# Patient Record
Sex: Male | Born: 1960
Health system: Southern US, Community
[De-identification: ages and names within clinical notes are randomized; demographics above are authoritative.]

## PROBLEM LIST (undated history)

## (undated) DIAGNOSIS — Z87442 Personal history of urinary calculi: Secondary | ICD-10-CM

## (undated) DIAGNOSIS — F32A Depression, unspecified: Secondary | ICD-10-CM

## (undated) DIAGNOSIS — D869 Sarcoidosis, unspecified: Secondary | ICD-10-CM

## (undated) DIAGNOSIS — G43909 Migraine, unspecified, not intractable, without status migrainosus: Secondary | ICD-10-CM

## (undated) DIAGNOSIS — K219 Gastro-esophageal reflux disease without esophagitis: Secondary | ICD-10-CM

## (undated) DIAGNOSIS — I10 Essential (primary) hypertension: Secondary | ICD-10-CM

## (undated) DIAGNOSIS — G4700 Insomnia, unspecified: Secondary | ICD-10-CM

## (undated) DIAGNOSIS — M545 Low back pain, unspecified: Secondary | ICD-10-CM

## (undated) DIAGNOSIS — F329 Major depressive disorder, single episode, unspecified: Secondary | ICD-10-CM

## (undated) DIAGNOSIS — G8929 Other chronic pain: Secondary | ICD-10-CM

## (undated) DIAGNOSIS — I509 Heart failure, unspecified: Secondary | ICD-10-CM

## (undated) DIAGNOSIS — I214 Non-ST elevation (NSTEMI) myocardial infarction: Secondary | ICD-10-CM

## (undated) DIAGNOSIS — N182 Chronic kidney disease, stage 2 (mild): Secondary | ICD-10-CM

## (undated) DIAGNOSIS — N289 Disorder of kidney and ureter, unspecified: Secondary | ICD-10-CM

## (undated) DIAGNOSIS — M1990 Unspecified osteoarthritis, unspecified site: Secondary | ICD-10-CM

## (undated) DIAGNOSIS — F418 Other specified anxiety disorders: Secondary | ICD-10-CM

## (undated) DIAGNOSIS — G4730 Sleep apnea, unspecified: Secondary | ICD-10-CM

## (undated) HISTORY — DX: Low back pain: M54.5

## (undated) HISTORY — DX: Other chronic pain: G89.29

## (undated) HISTORY — DX: Migraine, unspecified, not intractable, without status migrainosus: G43.909

## (undated) HISTORY — DX: Gastro-esophageal reflux disease without esophagitis: K21.9

## (undated) HISTORY — PX: HIP ARTHROPLASTY: SHX981

## (undated) HISTORY — DX: Sarcoidosis, unspecified: D86.9

## (undated) HISTORY — DX: Unspecified osteoarthritis, unspecified site: M19.90

## (undated) HISTORY — DX: Insomnia, unspecified: G47.00

## (undated) HISTORY — DX: Low back pain, unspecified: M54.50

## (undated) HISTORY — DX: Other specified anxiety disorders: F41.8

## (undated) HISTORY — PX: HARVEST BONE GRAFT: SHX377

## (undated) SURGERY — Surgical Case
Anesthesia: *Unknown

---

## 1999-06-03 ENCOUNTER — Encounter: Payer: Self-pay | Admitting: Cardiology

## 2001-10-31 ENCOUNTER — Encounter: Payer: Self-pay | Admitting: Cardiology

## 2003-11-05 ENCOUNTER — Ambulatory Visit (HOSPITAL_COMMUNITY): Admission: RE | Admit: 2003-11-05 | Discharge: 2003-11-05 | Payer: Self-pay | Admitting: Internal Medicine

## 2003-11-05 HISTORY — PX: COLONOSCOPY: SHX5424

## 2004-02-18 ENCOUNTER — Ambulatory Visit: Payer: Self-pay | Admitting: Urgent Care

## 2004-03-11 ENCOUNTER — Encounter
Admission: RE | Admit: 2004-03-11 | Discharge: 2004-06-09 | Payer: Self-pay | Admitting: Physical Medicine and Rehabilitation

## 2004-03-14 ENCOUNTER — Ambulatory Visit: Payer: Self-pay | Admitting: Physical Medicine and Rehabilitation

## 2004-03-22 ENCOUNTER — Ambulatory Visit (HOSPITAL_COMMUNITY): Admission: RE | Admit: 2004-03-22 | Discharge: 2004-03-22 | Payer: Self-pay | Admitting: Internal Medicine

## 2004-03-22 ENCOUNTER — Ambulatory Visit: Payer: Self-pay | Admitting: Internal Medicine

## 2004-03-22 HISTORY — PX: ESOPHAGOGASTRODUODENOSCOPY: SHX1529

## 2004-05-18 ENCOUNTER — Ambulatory Visit: Payer: Self-pay | Admitting: Physical Medicine and Rehabilitation

## 2004-06-13 ENCOUNTER — Encounter
Admission: RE | Admit: 2004-06-13 | Discharge: 2004-09-11 | Payer: Self-pay | Admitting: Physical Medicine and Rehabilitation

## 2004-06-16 ENCOUNTER — Ambulatory Visit: Payer: Self-pay | Admitting: Internal Medicine

## 2004-06-27 ENCOUNTER — Encounter: Payer: Self-pay | Admitting: Internal Medicine

## 2004-06-27 ENCOUNTER — Ambulatory Visit (HOSPITAL_COMMUNITY): Admission: RE | Admit: 2004-06-27 | Discharge: 2004-06-27 | Payer: Self-pay | Admitting: Internal Medicine

## 2004-06-27 ENCOUNTER — Encounter (INDEPENDENT_AMBULATORY_CARE_PROVIDER_SITE_OTHER): Payer: Self-pay | Admitting: Specialist

## 2004-07-05 ENCOUNTER — Ambulatory Visit: Payer: Self-pay | Admitting: Internal Medicine

## 2004-12-29 ENCOUNTER — Ambulatory Visit: Payer: Self-pay | Admitting: Internal Medicine

## 2005-09-11 ENCOUNTER — Ambulatory Visit: Payer: Self-pay | Admitting: Internal Medicine

## 2005-09-27 ENCOUNTER — Ambulatory Visit: Payer: Self-pay | Admitting: Orthopedic Surgery

## 2005-10-02 ENCOUNTER — Ambulatory Visit (HOSPITAL_COMMUNITY): Admission: RE | Admit: 2005-10-02 | Discharge: 2005-10-02 | Payer: Self-pay | Admitting: Orthopedic Surgery

## 2005-10-25 ENCOUNTER — Ambulatory Visit: Payer: Self-pay | Admitting: Orthopedic Surgery

## 2005-11-24 ENCOUNTER — Encounter: Admission: RE | Admit: 2005-11-24 | Discharge: 2005-11-24 | Payer: Self-pay | Admitting: Orthopedic Surgery

## 2005-12-29 ENCOUNTER — Encounter: Admission: RE | Admit: 2005-12-29 | Discharge: 2005-12-29 | Payer: Self-pay | Admitting: Orthopedic Surgery

## 2006-01-19 ENCOUNTER — Encounter: Admission: RE | Admit: 2006-01-19 | Discharge: 2006-01-19 | Payer: Self-pay | Admitting: Orthopedic Surgery

## 2006-02-21 ENCOUNTER — Ambulatory Visit: Payer: Self-pay | Admitting: Orthopedic Surgery

## 2007-03-25 ENCOUNTER — Ambulatory Visit: Payer: Self-pay | Admitting: Internal Medicine

## 2007-04-11 HISTORY — PX: LAMINECTOMY: SHX219

## 2007-04-30 ENCOUNTER — Ambulatory Visit: Payer: Self-pay | Admitting: Pain Medicine

## 2007-05-06 ENCOUNTER — Encounter (INDEPENDENT_AMBULATORY_CARE_PROVIDER_SITE_OTHER): Payer: Self-pay | Admitting: Internal Medicine

## 2007-05-09 ENCOUNTER — Telehealth (INDEPENDENT_AMBULATORY_CARE_PROVIDER_SITE_OTHER): Payer: Self-pay | Admitting: Internal Medicine

## 2007-05-09 ENCOUNTER — Ambulatory Visit: Payer: Self-pay | Admitting: Internal Medicine

## 2007-05-09 DIAGNOSIS — Z87898 Personal history of other specified conditions: Secondary | ICD-10-CM | POA: Insufficient documentation

## 2007-05-09 DIAGNOSIS — M129 Arthropathy, unspecified: Secondary | ICD-10-CM | POA: Insufficient documentation

## 2007-05-09 DIAGNOSIS — K219 Gastro-esophageal reflux disease without esophagitis: Secondary | ICD-10-CM | POA: Insufficient documentation

## 2007-05-09 DIAGNOSIS — D869 Sarcoidosis, unspecified: Secondary | ICD-10-CM | POA: Insufficient documentation

## 2007-05-09 DIAGNOSIS — M545 Low back pain, unspecified: Secondary | ICD-10-CM | POA: Insufficient documentation

## 2007-05-09 DIAGNOSIS — F3289 Other specified depressive episodes: Secondary | ICD-10-CM | POA: Insufficient documentation

## 2007-05-09 DIAGNOSIS — F411 Generalized anxiety disorder: Secondary | ICD-10-CM | POA: Insufficient documentation

## 2007-05-09 DIAGNOSIS — F329 Major depressive disorder, single episode, unspecified: Secondary | ICD-10-CM

## 2007-05-13 ENCOUNTER — Encounter (INDEPENDENT_AMBULATORY_CARE_PROVIDER_SITE_OTHER): Payer: Self-pay | Admitting: Internal Medicine

## 2007-06-12 ENCOUNTER — Ambulatory Visit: Payer: Self-pay | Admitting: Pain Medicine

## 2007-06-18 ENCOUNTER — Ambulatory Visit: Payer: Self-pay | Admitting: Pain Medicine

## 2007-07-18 ENCOUNTER — Ambulatory Visit: Payer: Self-pay | Admitting: Internal Medicine

## 2007-07-18 DIAGNOSIS — G4700 Insomnia, unspecified: Secondary | ICD-10-CM | POA: Insufficient documentation

## 2007-07-18 DIAGNOSIS — J309 Allergic rhinitis, unspecified: Secondary | ICD-10-CM | POA: Insufficient documentation

## 2007-07-23 ENCOUNTER — Encounter (INDEPENDENT_AMBULATORY_CARE_PROVIDER_SITE_OTHER): Payer: Self-pay | Admitting: Internal Medicine

## 2007-07-31 ENCOUNTER — Ambulatory Visit: Payer: Self-pay | Admitting: Internal Medicine

## 2007-08-14 ENCOUNTER — Encounter (INDEPENDENT_AMBULATORY_CARE_PROVIDER_SITE_OTHER): Payer: Self-pay | Admitting: Internal Medicine

## 2007-08-14 ENCOUNTER — Ambulatory Visit: Payer: Self-pay | Admitting: Pain Medicine

## 2007-08-15 ENCOUNTER — Ambulatory Visit: Payer: Self-pay | Admitting: Pain Medicine

## 2007-08-28 ENCOUNTER — Ambulatory Visit: Payer: Self-pay | Admitting: Internal Medicine

## 2007-08-29 ENCOUNTER — Ambulatory Visit: Payer: Self-pay | Admitting: Physician Assistant

## 2007-09-25 ENCOUNTER — Encounter (INDEPENDENT_AMBULATORY_CARE_PROVIDER_SITE_OTHER): Payer: Self-pay | Admitting: Internal Medicine

## 2007-09-26 ENCOUNTER — Ambulatory Visit: Payer: Self-pay | Admitting: Physician Assistant

## 2007-09-27 ENCOUNTER — Ambulatory Visit: Payer: Self-pay | Admitting: Internal Medicine

## 2007-10-03 ENCOUNTER — Encounter (INDEPENDENT_AMBULATORY_CARE_PROVIDER_SITE_OTHER): Payer: Self-pay | Admitting: Internal Medicine

## 2007-10-03 ENCOUNTER — Ambulatory Visit: Payer: Self-pay | Admitting: Internal Medicine

## 2007-10-30 ENCOUNTER — Ambulatory Visit: Payer: Self-pay | Admitting: Physician Assistant

## 2007-10-30 ENCOUNTER — Encounter (INDEPENDENT_AMBULATORY_CARE_PROVIDER_SITE_OTHER): Payer: Self-pay | Admitting: Internal Medicine

## 2007-11-29 ENCOUNTER — Ambulatory Visit: Payer: Self-pay | Admitting: Internal Medicine

## 2007-12-03 ENCOUNTER — Ambulatory Visit: Payer: Self-pay | Admitting: Physician Assistant

## 2007-12-04 ENCOUNTER — Telehealth (INDEPENDENT_AMBULATORY_CARE_PROVIDER_SITE_OTHER): Payer: Self-pay | Admitting: *Deleted

## 2007-12-05 ENCOUNTER — Encounter (INDEPENDENT_AMBULATORY_CARE_PROVIDER_SITE_OTHER): Payer: Self-pay | Admitting: Internal Medicine

## 2007-12-10 ENCOUNTER — Ambulatory Visit: Payer: Self-pay | Admitting: Pain Medicine

## 2007-12-11 ENCOUNTER — Encounter (INDEPENDENT_AMBULATORY_CARE_PROVIDER_SITE_OTHER): Payer: Self-pay | Admitting: Internal Medicine

## 2007-12-18 ENCOUNTER — Ambulatory Visit: Payer: Self-pay | Admitting: Internal Medicine

## 2007-12-18 LAB — CONVERTED CEMR LAB
Blood in Urine, dipstick: NEGATIVE
Ketones, urine, test strip: NEGATIVE
Nitrite: NEGATIVE
WBC Urine, dipstick: NEGATIVE
pH: 6.5

## 2007-12-24 ENCOUNTER — Ambulatory Visit: Payer: Self-pay | Admitting: Physician Assistant

## 2007-12-27 ENCOUNTER — Ambulatory Visit: Payer: Self-pay | Admitting: Internal Medicine

## 2008-01-02 ENCOUNTER — Telehealth (INDEPENDENT_AMBULATORY_CARE_PROVIDER_SITE_OTHER): Payer: Self-pay | Admitting: *Deleted

## 2008-01-03 ENCOUNTER — Ambulatory Visit (HOSPITAL_COMMUNITY): Admission: RE | Admit: 2008-01-03 | Discharge: 2008-01-03 | Payer: Self-pay | Admitting: Internal Medicine

## 2008-01-03 ENCOUNTER — Ambulatory Visit: Payer: Self-pay | Admitting: Internal Medicine

## 2008-01-24 ENCOUNTER — Ambulatory Visit: Payer: Self-pay | Admitting: Internal Medicine

## 2008-02-21 ENCOUNTER — Ambulatory Visit: Payer: Self-pay | Admitting: Internal Medicine

## 2008-02-24 ENCOUNTER — Ambulatory Visit: Payer: Self-pay | Admitting: Physician Assistant

## 2008-03-16 ENCOUNTER — Encounter (INDEPENDENT_AMBULATORY_CARE_PROVIDER_SITE_OTHER): Payer: Self-pay | Admitting: Internal Medicine

## 2008-03-27 ENCOUNTER — Ambulatory Visit: Payer: Self-pay | Admitting: Internal Medicine

## 2008-03-27 DIAGNOSIS — F17200 Nicotine dependence, unspecified, uncomplicated: Secondary | ICD-10-CM | POA: Insufficient documentation

## 2008-03-31 LAB — CONVERTED CEMR LAB: Varicella IgG: 2.93 — ABNORMAL HIGH

## 2008-04-24 ENCOUNTER — Ambulatory Visit: Payer: Self-pay | Admitting: Internal Medicine

## 2008-05-05 ENCOUNTER — Encounter: Admission: RE | Admit: 2008-05-05 | Discharge: 2008-05-05 | Payer: Self-pay | Admitting: Neurosurgery

## 2008-05-19 ENCOUNTER — Ambulatory Visit: Payer: Self-pay | Admitting: Internal Medicine

## 2008-05-20 ENCOUNTER — Inpatient Hospital Stay (HOSPITAL_COMMUNITY): Admission: RE | Admit: 2008-05-20 | Discharge: 2008-05-22 | Payer: Self-pay | Admitting: Neurosurgery

## 2008-05-20 LAB — CONVERTED CEMR LAB
Basophils Absolute: 0.1 10*3/uL (ref 0.0–0.1)
Basophils Relative: 1 % (ref 0–1)
Hemoglobin: 13.9 g/dL (ref 13.0–17.0)
INR: 0.9 (ref 0.0–1.5)
Lymphocytes Relative: 26 % (ref 12–46)
MCHC: 34 g/dL (ref 30.0–36.0)
Monocytes Absolute: 0.9 10*3/uL (ref 0.1–1.0)
Neutro Abs: 7.3 10*3/uL (ref 1.7–7.7)
Neutrophils Relative %: 64 % (ref 43–77)
Prothrombin Time: 11.8 s (ref 11.6–15.2)
RDW: 14.2 % (ref 11.5–15.5)
aPTT: 31 s

## 2008-06-08 ENCOUNTER — Encounter: Payer: Self-pay | Admitting: Physician Assistant

## 2008-06-17 ENCOUNTER — Ambulatory Visit: Payer: Self-pay | Admitting: Physician Assistant

## 2008-07-06 ENCOUNTER — Ambulatory Visit (HOSPITAL_COMMUNITY): Admission: RE | Admit: 2008-07-06 | Discharge: 2008-07-06 | Payer: Self-pay | Admitting: Internal Medicine

## 2008-07-06 ENCOUNTER — Ambulatory Visit: Payer: Self-pay | Admitting: Internal Medicine

## 2008-07-13 ENCOUNTER — Telehealth (INDEPENDENT_AMBULATORY_CARE_PROVIDER_SITE_OTHER): Payer: Self-pay | Admitting: Internal Medicine

## 2008-07-27 ENCOUNTER — Ambulatory Visit: Payer: Self-pay | Admitting: Internal Medicine

## 2008-07-27 ENCOUNTER — Ambulatory Visit (HOSPITAL_COMMUNITY): Admission: RE | Admit: 2008-07-27 | Discharge: 2008-07-27 | Payer: Self-pay | Admitting: Internal Medicine

## 2008-07-29 ENCOUNTER — Telehealth (INDEPENDENT_AMBULATORY_CARE_PROVIDER_SITE_OTHER): Payer: Self-pay | Admitting: Internal Medicine

## 2008-08-03 ENCOUNTER — Ambulatory Visit: Payer: Self-pay | Admitting: Internal Medicine

## 2008-08-03 LAB — CONVERTED CEMR LAB
Calcium: 9.4 mg/dL (ref 8.4–10.5)
Creatinine, Ser: 1.24 mg/dL (ref 0.40–1.50)
Sodium: 140 meq/L (ref 135–145)

## 2008-08-05 ENCOUNTER — Ambulatory Visit (HOSPITAL_COMMUNITY): Admission: RE | Admit: 2008-08-05 | Discharge: 2008-08-05 | Payer: Self-pay | Admitting: Internal Medicine

## 2008-08-17 ENCOUNTER — Ambulatory Visit: Payer: Self-pay | Admitting: Internal Medicine

## 2008-09-14 ENCOUNTER — Ambulatory Visit: Payer: Self-pay | Admitting: Internal Medicine

## 2008-10-05 ENCOUNTER — Encounter (INDEPENDENT_AMBULATORY_CARE_PROVIDER_SITE_OTHER): Payer: Self-pay

## 2008-10-14 ENCOUNTER — Ambulatory Visit: Payer: Self-pay | Admitting: Internal Medicine

## 2008-11-18 ENCOUNTER — Ambulatory Visit: Payer: Self-pay | Admitting: Internal Medicine

## 2008-12-22 ENCOUNTER — Encounter: Admission: RE | Admit: 2008-12-22 | Discharge: 2008-12-22 | Payer: Self-pay | Admitting: Neurosurgery

## 2008-12-23 ENCOUNTER — Ambulatory Visit: Payer: Self-pay | Admitting: Internal Medicine

## 2009-02-15 ENCOUNTER — Encounter: Payer: Self-pay | Admitting: Gastroenterology

## 2009-03-17 ENCOUNTER — Encounter: Payer: Self-pay | Admitting: Cardiology

## 2009-05-11 ENCOUNTER — Encounter
Admission: RE | Admit: 2009-05-11 | Discharge: 2009-08-09 | Payer: Self-pay | Admitting: Physical Medicine and Rehabilitation

## 2009-05-12 ENCOUNTER — Ambulatory Visit: Payer: Self-pay | Admitting: Physical Medicine and Rehabilitation

## 2009-06-09 ENCOUNTER — Ambulatory Visit: Payer: Self-pay | Admitting: Physical Medicine and Rehabilitation

## 2009-07-07 ENCOUNTER — Ambulatory Visit: Payer: Self-pay | Admitting: Physical Medicine and Rehabilitation

## 2009-08-09 ENCOUNTER — Encounter
Admission: RE | Admit: 2009-08-09 | Discharge: 2009-11-04 | Payer: Self-pay | Admitting: Physical Medicine and Rehabilitation

## 2009-08-18 ENCOUNTER — Ambulatory Visit: Payer: Self-pay | Admitting: Physical Medicine and Rehabilitation

## 2009-09-10 ENCOUNTER — Ambulatory Visit: Payer: Self-pay | Admitting: Physical Medicine and Rehabilitation

## 2009-10-14 ENCOUNTER — Ambulatory Visit: Payer: Self-pay | Admitting: Physical Medicine and Rehabilitation

## 2009-11-04 ENCOUNTER — Encounter
Admission: RE | Admit: 2009-11-04 | Discharge: 2010-01-24 | Payer: Self-pay | Admitting: Physical Medicine & Rehabilitation

## 2009-11-08 ENCOUNTER — Encounter: Payer: Self-pay | Admitting: Cardiology

## 2009-11-10 ENCOUNTER — Ambulatory Visit: Payer: Self-pay | Admitting: Physical Medicine and Rehabilitation

## 2009-11-17 ENCOUNTER — Encounter: Payer: Self-pay | Admitting: Cardiology

## 2009-11-29 ENCOUNTER — Encounter (INDEPENDENT_AMBULATORY_CARE_PROVIDER_SITE_OTHER): Payer: Self-pay | Admitting: *Deleted

## 2009-11-29 ENCOUNTER — Ambulatory Visit: Payer: Self-pay | Admitting: Cardiology

## 2009-12-02 ENCOUNTER — Encounter: Payer: Self-pay | Admitting: Cardiology

## 2009-12-03 ENCOUNTER — Encounter: Payer: Self-pay | Admitting: Cardiology

## 2009-12-03 ENCOUNTER — Ambulatory Visit: Payer: Self-pay | Admitting: Cardiology

## 2009-12-08 ENCOUNTER — Ambulatory Visit: Payer: Self-pay | Admitting: Physical Medicine and Rehabilitation

## 2010-01-05 ENCOUNTER — Ambulatory Visit: Payer: Self-pay | Admitting: Physical Medicine and Rehabilitation

## 2010-01-24 ENCOUNTER — Encounter
Admission: RE | Admit: 2010-01-24 | Discharge: 2010-03-25 | Payer: Self-pay | Source: Home / Self Care | Attending: Physical Medicine and Rehabilitation | Admitting: Physical Medicine and Rehabilitation

## 2010-02-02 ENCOUNTER — Ambulatory Visit: Payer: Self-pay | Admitting: Physical Medicine and Rehabilitation

## 2010-03-02 ENCOUNTER — Ambulatory Visit: Payer: Self-pay | Admitting: Physical Medicine and Rehabilitation

## 2010-03-25 ENCOUNTER — Ambulatory Visit: Payer: Self-pay | Admitting: Physical Medicine and Rehabilitation

## 2010-04-10 HISTORY — PX: BACK SURGERY: SHX140

## 2010-04-19 ENCOUNTER — Encounter
Admission: RE | Admit: 2010-04-19 | Discharge: 2010-05-10 | Payer: Self-pay | Source: Home / Self Care | Attending: Physical Medicine and Rehabilitation | Admitting: Physical Medicine and Rehabilitation

## 2010-04-22 ENCOUNTER — Ambulatory Visit
Admission: RE | Admit: 2010-04-22 | Discharge: 2010-04-22 | Payer: Self-pay | Source: Home / Self Care | Attending: Physical Medicine and Rehabilitation | Admitting: Physical Medicine and Rehabilitation

## 2010-04-30 ENCOUNTER — Encounter: Payer: Self-pay | Admitting: Orthopedic Surgery

## 2010-05-01 ENCOUNTER — Encounter: Payer: Self-pay | Admitting: Internal Medicine

## 2010-05-08 LAB — CONVERTED CEMR LAB
Bilirubin Urine: NEGATIVE
Blood in Urine, dipstick: NEGATIVE
Glucose, Urine, Semiquant: NEGATIVE
Hemoglobin: 14.7 g/dL
Protein, U semiquant: NEGATIVE
Specific Gravity, Urine: 1.02
WBC Urine, dipstick: NEGATIVE
pH: 7

## 2010-05-09 ENCOUNTER — Ambulatory Visit
Admission: RE | Admit: 2010-05-09 | Discharge: 2010-05-09 | Payer: Self-pay | Source: Home / Self Care | Attending: Urgent Care | Admitting: Urgent Care

## 2010-05-09 ENCOUNTER — Encounter: Payer: Self-pay | Admitting: Internal Medicine

## 2010-05-09 DIAGNOSIS — R159 Full incontinence of feces: Secondary | ICD-10-CM | POA: Insufficient documentation

## 2010-05-09 DIAGNOSIS — R197 Diarrhea, unspecified: Secondary | ICD-10-CM | POA: Insufficient documentation

## 2010-05-10 ENCOUNTER — Ambulatory Visit
Admission: RE | Admit: 2010-05-10 | Discharge: 2010-05-10 | Payer: Self-pay | Source: Home / Self Care | Attending: Physical Medicine and Rehabilitation | Admitting: Physical Medicine and Rehabilitation

## 2010-05-10 ENCOUNTER — Encounter: Payer: Self-pay | Admitting: Urgent Care

## 2010-05-12 NOTE — Letter (Signed)
Summary: Internal Other/ PATIENT HISTORY FORM  Internal Other/ PATIENT HISTORY FORM   Imported By: Dorise Hiss 12/07/2009 15:55:40  _____________________________________________________________________  External Attachment:    Type:   Image     Comment:   External Document

## 2010-05-12 NOTE — Letter (Signed)
Summary: External Correspondence/ OFFICE VISIT DR. Aleen Campi  External Correspondence/ OFFICE VISIT DR. Aleen Campi   Imported By: Dorise Hiss 11/18/2009 15:51:26  _____________________________________________________________________  External Attachment:    Type:   Image     Comment:   External Document

## 2010-05-12 NOTE — Letter (Signed)
Summary: Letter/ [facility]  Letter/ [facility]   Imported By: Dorise Hiss 12/02/2009 11:46:30  _____________________________________________________________________  External Attachment:    Type:   Image     Comment:   External Document  Appended Document: Letter/ [facility] Patient just seen in consultation on 8/22 with atypical CP.  Stress testing is pending.  Can make recommendation when results available.  Appended Document: Letter/ [facility] Faxed this note with copy of office note and stress test results to oral surgeon.

## 2010-05-12 NOTE — Medication Information (Signed)
Summary: Environmental health practitioner [facility]  RX Folder/ [facility]   Imported By: Dorise Hiss 11/18/2009 16:00:25  _____________________________________________________________________  External Attachment:    Type:   Image     Comment:   External Document

## 2010-05-12 NOTE — Assessment & Plan Note (Signed)
Summary: NP-CHEST PAIN   Visit Type:  Initial Consult Primary Provider:  Kermit Balo. Tysinger, PA-C   History of Present Illness: 50 year old male referred for cardiology consultation. He describes a 3-4 month history of intermittent chest pain described as a "hammer." He seems to indicate a relation to "stress" in his life. He reports that when he was at the beach for a week, he had no symptoms. No specific relation to exertion or his baseline shortness of breath with activity. No palpitations or syncope. Symptoms can last "all day."  He reports a vague history of "heart problems." This is not at all well-defined based on chart review. He states he had "heart attacks" each year ranging from 2003 through 2007. [facility] chart review shows consistently normal troponin levels dating between 2006 and 2009. It appears that he underwent a standard treadmill test in April 2006, report purged. Denies any interval cardiac testing.  Patient states that he maintains follow up with Dr. Orson Aloe and Dr. Jena Gauss for both pulmonary and hepatic sarcoidosis. He continues to smoke cigarettes heavily. We discussed smoking cessation today.  Preventive Screening-Counseling & Management  Alcohol-Tobacco     Smoking Status: current     Packs/Day: 0.5     Year Started: 20 +  Current Medications (verified): 1)  Patanol 0.1 %  Soln (Olopatadine Hcl) .Marland Kitchen.. 1 Drop in Each Eye Two Times A Day As Needed 2)  Fluticasone Propionate 50 Mcg/act  Susp (Fluticasone Propionate) .... 2 Sprays in Each Nostril Two Times A Day As Needed 3)  Abilify 10 Mg Tabs (Aripiprazole) .Marland Kitchen.. 1 By Mouth Once Daily 4)  Albuterol Sulfate (2.5 Mg/65ml) 0.083% Nebu (Albuterol Sulfate) .Marland Kitchen.. 1 in Nebulizer Q 4 Hours As Needed Shortness of Breath 5)  Ventolin Hfa 108 (90 Base) Mcg/act Aers (Albuterol Sulfate) .... 2 Puffs Q 6 Hours As Needed Shortness of Breath 6)  Venlafaxine Hcl 37.5 Mg Tabs (Venlafaxine Hcl) .... Take 1 Tab By Mouth At Bedtime 7)   Diazepam 10 Mg Tabs (Diazepam) .... Take 1 Tablet By Mouth Two Times A Day 8)  Hydrocodone-Acetaminophen 5-500 Mg Tabs (Hydrocodone-Acetaminophen) .... Take 1 Tablet By Mouth Two Times A Day As Needed Back Pain 9)  Pravastatin Sodium 10 Mg Tabs (Pravastatin Sodium) .... Take 1 Tab By Mouth At Bedtime 10)  Lyrica 50 Mg Caps (Pregabalin) .... Take 1 Tablet By Mouth Two Times A Day ]  Allergies: 1)  ! * Fentanyl 2)  Aspirin 3)  * Methadone  Past History:  Family History: Last updated: 2009/12/18 Father - deceased-lung cancer Mother - deceased CHF 36 siblings (all full) 15 living siblings Sister - 27 deceased - brain, liver cancer Sister - 29 - arthritis, foot problems Brothe r- 60 - lower back pain  Social History: Last updated: 12-18-2009 Single Current Smoker - 3ppd x26years - now 1/2 per day Alcohol use - yes - 10+ per day, 2-4 times per month Disabled  Regular Exercise - yes, walks  Past Medical History: Anxiety Depression GERD Chronic low back pain - followed in Pain Clinic (Dr. Pamelia Hoit) Arthritis Migraine headaches Sarcoidosis - multisystem, pulmonary and hepatic (Dr. Jena Gauss) Allergic rhinitis Insomnia  Past Surgical History: Bilateral L4-L5 laminectomy 2/10  Family History: Father - deceased-lung cancer Mother - deceased CHF 51 siblings (all full) 15 living siblings Sister - 15 deceased - brain, liver cancer Sister - 47 - arthritis, foot problems Brothe r- 60 - lower back pain  Social History: Single Current Smoker - 3ppd x26years - now 1/2 per day  Alcohol use - yes - 10+ per day, 2-4 times per month Disabled  Regular Exercise - yes, walks Packs/Day:  0.5  Review of Systems       The patient complains of chest pain and dyspnea on exertion.  The patient denies anorexia, fever, syncope, peripheral edema, prolonged cough, headaches, melena, hematochezia, and severe indigestion/heartburn.         Reports chronic back pain. Otherwise negative except  as outlined.  Vital Signs:  Patient profile:   50 year old male Height:      74 inches Weight:      192.50 pounds BMI:     24.80 Pulse rate:   79 / minute BP sitting:   122 / 85  (left arm) Cuff size:   regular  Vitals Entered By: Hoover Brunette, LPN (November 29, 2009 3:20 PM) Is Patient Diabetic? Yes Comments chest pain x 3-4 months   Physical Exam  Additional Exam:  Overweight weight male in no acute distress. HEENT: Conjunctiva and lids normal, oropharynx with poor dentition. Neck: Supple, no elevated JVP or bruits. No thyromegaly. Lungs: Diminished breath sounds, coarse, no wheezing. Cardiac: Regular rate and rhythm, no S3 or pericardial rub. Abdomen: Soft, nontender, bowel sounds present. Extremities: No pitting edema, distal pulses 2+. Skin: Warm and dry. Musculoskeletal: No kyphosis. Neuropsychiatric: Alert and oriented x3, affect grossly appropriate.   ABI's  Procedure date:  02/12/2009  Findings:      No significant arterial occlusive disease in the lower extremities.  PFT  Procedure date:  06/15/2009  Findings:      FEC 3.8, 82% predicted. FEV1 3, 82% predicted. Unremarkable flow volume loop. TLC 5.16, 66% of predicted. DLCO 16.4, 55% of predicted. Normal airway flow rates with evidence of restrictive change and moderately reduced DLCO. Noted in the setting of sarcoidosis.  EKG  Procedure date:  11/17/2009  Findings:      Recent electrocardiogram from [facility] shows sinus bradycardia at 58 beats per minute with nonspecific ST-T wave changes, borderline prolonged PR interval.  Impression & Recommendations:  Problem # 1:  CHEST PAIN UNSPECIFIED (ICD-786.50)  Atypical chest pain syndrome as outlined above. Nonexertional or associated with any consistent precipitant other than "stress." Patient reports a very vague history of "heart problems" with no clear documentation based on record review. He has not undergone any ischemic evaluation at  least since an apparent treadmill test in 2006. Principle cardiac risk factors include gender, ongoing tobacco abuse. His electrocardiogram is nonspecific. We discussed the matter, and will plan a screening exercise echocardiogram for ischemic surveillance. Could convert to a dobutamine study if needed. If this study is low risk, doubt that further cardiac testing will be needed at this time. In that case he can continue to follow with his primary care provider.  Orders: Echo- Stress (Stress Echo)  Problem # 2:  TOBACCO ABUSE (ICD-305.1)  I discussed smoking cessation with Mr. Mcglamery.  Patient Instructions: 1)  Your physician has requested that you have a stress echocardiogram. For further information please visit [email or web address]  Please follow instruction sheet as given. If the results of your test are normal or stable, you will receive a letter. If they are abnormal, the nurse will contact you by phone.  2)  Follow up will be based on results.

## 2010-05-12 NOTE — Letter (Signed)
Summary: Stress Echocardiography  [facility] [address]   [place], [place] [address]   Phone: [phone]  Fax: [phone]      [facility]  Stress Echocardiography    Michael [name]  Appointment Date:_  Appointment Time:_   Your doctor has ordered a stress echo to help determine the condition of your heart during exercise. If you take blood pressure medication, ask your doctor if you should take it the day of your test. You should not have anything to eat or drink at least 4 hours before your test is scheduled.  You will be asked to undress from the waist up and given a hospital gown to wear, so dress comfortably from the waist down for example: Sweat pants, shorts, or skirt Rubber soled lace up shoes (tennis shoes)  You will need to register at the Outpatient/Main Entrance at the hospital 15 minutes before your appointment time. It is a good idea to bring a copy of your order with you. They will direct you to the Cardiovascular Department on the third floor.   Plan on about an hour and a half  from registration to release from the hospital  You may take all of your medications with water the morning of your test.  If the results of your test are normal or stable, you will receive a letter. If they are abnormal, the nurse will contact you by phone.

## 2010-05-13 ENCOUNTER — Encounter: Payer: Self-pay | Admitting: Urgent Care

## 2010-05-13 NOTE — Miscellaneous (Signed)
Summary: Immunizations/ [facility]  Immunizations/ [facility]   Imported By: Dorise Hiss 11/18/2009 15:59:02  _____________________________________________________________________  External Attachment:    Type:   Image     Comment:   External Document

## 2010-05-16 LAB — CONVERTED CEMR LAB
ALT: 19 units/L (ref 0–53)
AST: 16 units/L (ref 0–37)
Albumin: 3.9 g/dL (ref 3.5–5.2)
Alkaline Phosphatase: 117 units/L (ref 39–117)
Basophils Absolute: 0 10*3/uL (ref 0.0–0.1)
Bilirubin, Direct: 0.1 mg/dL (ref 0.0–0.3)
Chloride: 108 meq/L (ref 96–112)
Eosinophils Relative: 1 % (ref 0–5)
Glucose, Bld: 123 mg/dL — ABNORMAL HIGH (ref 70–99)
HCT: 39.5 % (ref 39.0–52.0)
Hemoglobin: 13.2 g/dL (ref 13.0–17.0)
Lymphocytes Relative: 14 % (ref 12–46)
Lymphs Abs: 1.8 10*3/uL (ref 0.7–4.0)
Monocytes Absolute: 1 10*3/uL (ref 0.1–1.0)
Neutro Abs: 9.7 10*3/uL — ABNORMAL HIGH (ref 1.7–7.7)
Potassium: 4 meq/L (ref 3.5–5.3)
RBC: 4.36 M/uL (ref 4.22–5.81)
RDW: 14.2 % (ref 11.5–15.5)
Sodium: 142 meq/L (ref 135–145)
Total Bilirubin: 0.3 mg/dL (ref 0.3–1.2)
WBC: 12.6 10*3/uL — ABNORMAL HIGH (ref 4.0–10.5)

## 2010-05-18 NOTE — Miscellaneous (Addendum)
Summary: Orders Update  Clinical Lists Changes  Orders: Added new Test order of T-Culture, Stool ([id number]) - Signed Added new Test order of T-Stool for O&P ([id number]) - Signed Added new Test order of T-Fecal WBC ([id number]) - Signed Added new Test order of T-C diff by PCR ([id number]) - Signed  Appended Document: Orders Update Did pt do stool studies?  Appended Document: Orders Update tried to call [facility]- all lines were busy  Appended Document: Orders Update Pt said he has not done the stool studies yet, but will try to do soon.

## 2010-05-18 NOTE — Letter (Addendum)
Summary: TCS ORDER  TCS ORDER   Imported By: Ave Filter 05/09/2010 12:10:37  _____________________________________________________________________  External Attachment:    Type:   Image     Comment:   External Document  Appended Document: TCS ORDER attempted tcs; NOT PREPPED procedure aborted; pt non-compliant; I will offer him the opportunity to try again- he is to call the office if interested.

## 2010-05-18 NOTE — Assessment & Plan Note (Signed)
Summary: FU ON LIVER,SARCOIDOSIS/SS   Visit Type:  Follow-up Visit Primary Care Provider:  Dr. Crosby Oyster  Chief Complaint:  follow up- needs tcs- had diarrhea x 1.  History of Present Illness: Pulmonologist:  Dr. Steele Berg [facility]) [facility]:  Fax [phone]  50 y/o black [name] w/ hx multisystemic sarcoidosis w/ hepatic involvement.  He has not been seen in 2 1/2 yrs.  Previously no showed or rescheduled several appts and did not return for FU until today.  Hx of incontinence in shower last week with large amt brown, non-bloody stool.  Denies diarrhea or abd pain.  Denies nausea, vomiting or fever.  Did have a slight headache.  Felt "sick" w/ weakness & fatigue.  Denies any heartburn, indigestion, dysphagia or odynophagia.  States he gained up to 220#, now he's back down to almost 60# greater than 2 1/2 yrs ago.   No recent labs done.   Current Problems (verified): 1)  Diarrhea  (ICD-787.91) 2)  Other Specified Counseling  (ICD-V65.49) 3)  Encounter For Long-term Use of Other Medications  (ICD-V58.69) 4)  Tobacco Abuse  (ICD-305.1) 5)  Insomnia  (ICD-780.52) 6)  Allergic Rhinitis  (ICD-477.9) 7)  Sarcoidosis  (ICD-135) 8)  Migraines, Hx of  (ICD-V13.8) 9)  Arthritis  (ICD-716.90) 10)  Low Back Pain  (ICD-724.2) 11)  Gerd  (ICD-530.81) 12)  Depression  (ICD-311) 13)  Anxiety  (ICD-300.00)  Current Medications (verified): 1)  Patanol 0.1 %  Soln (Olopatadine Hcl) .Marland Kitchen.. 1 Drop in Each Eye Two Times A Day As Needed 2)  Fluticasone Propionate 50 Mcg/act  Susp (Fluticasone Propionate) .... 2 Sprays in Each Nostril Two Times A Day As Needed 3)  Abilify 10 Mg Tabs (Aripiprazole) .Marland Kitchen.. 1 By Mouth Once Daily 4)  Albuterol Sulfate (2.5 Mg/34ml) 0.083% Nebu (Albuterol Sulfate) .Marland Kitchen.. 1 in Nebulizer Q 4 Hours As Needed Shortness of Breath 5)  Ventolin Hfa 108 (90 Base) Mcg/act Aers (Albuterol Sulfate) .... 2 Puffs Q 6 Hours As Needed Shortness of Breath 6)  Diazepam 10 Mg Tabs (Diazepam)  .... Take 1 Tablet By Mouth Two Times A Day 7)  Hydrocodone-Acetaminophen 5-500 Mg Tabs (Hydrocodone-Acetaminophen) .... Take 1 Tablet By Mouth Two Times A Day As Needed Back Pain 8)  Pravastatin Sodium 10 Mg Tabs (Pravastatin Sodium) .... Take 1 Tab By Mouth At Bedtime 9)  Lyrica 50 Mg Caps (Pregabalin) .... Take 1 Tablet By Mouth Two Times A Day ]  Allergies (verified): 1)  ! * Fentanyl 2)  Aspirin 3)  * Methadone  Past History:  Past Surgical History: Last updated: 11/29/2009 Bilateral L4-L5 laminectomy 2/10  Past Medical History: Anxiety Depression GERD, EGD w/SB bx 12/05->small HH, mild duodenitis Chronic low back pain - followed in Pain Clinic (Dr. Pamelia Hoit) Arthritis Migraine headaches Sarcoidosis - multisystem, pulmonary and hepatic (Dr. Jena Gauss) liver bx3/2006->sarcoidosis, mildly positive AMA, but on re-check normal Allergic rhinitis Insomnia  Family History: Reviewed history from 11/29/2009 and no changes required. Father - deceased-lung cancer Mother - deceased CHF 53 siblings (all full) 15 living siblings Sister - 9 deceased - brain, liver cancer Sister - 42 - arthritis, foot problems Brother- 22 - lower back pain  Social History: Single Current Smoker - 3ppd x26years - now 1/2 per day Alcohol use - occ Diisabled  Regular Exercise - yes, walks Illicit Drug Use - no Drug Use:  no  Review of Systems      See HPI General:  Complains of fatigue; denies fever, chills, sweats, anorexia, weakness, malaise,  weight loss, and sleep disorder. CV:  Denies chest pains, angina, palpitations, syncope, dyspnea on exertion, orthopnea, PND, peripheral edema, and claudication. Resp:  Denies dyspnea at rest, dyspnea with exercise, cough, sputum, wheezing, coughing up blood, and pleurisy. GI:  See HPI; Denies difficulty swallowing, pain on swallowing, jaundice, diarrhea, constipation, bloody BM's, and black BMs. GU:  Denies urinary burning, blood in urine, urinary  frequency, urinary hesitancy, nocturnal urination, and urinary incontinence. MS:  Denies joint pain / LOM, joint swelling, joint stiffness, joint deformity, low back pain, muscle weakness, muscle cramps, muscle atrophy, leg pain at night, leg pain with exertion, and shoulder pain / LOM hand / wrist pain (CTS). Derm:  Denies rash, itching, dry skin, hives, moles, warts, and unhealing ulcers. Psych:  Complains of depression and anxiety; denies memory loss, suicidal ideation, hallucinations, paranoia, phobia, and confusion. Heme:  Denies bruising, bleeding, and enlarged lymph nodes.  Vital Signs:  Patient profile:   50 year old [name] Height:      74 inches Weight:      194 pounds BMI:     25.00 Temp:     98.0 degrees F oral Pulse rate:   84 / minute BP sitting:   130 / 80  (left arm) Cuff size:   regular  Vitals Entered By: Hendricks Limes LPN (May 09, 2010 10:58 AM)  Physical Exam  General:  Well developed, well nourished, no acute distress. Head:  Normocephalic and atraumatic. Eyes:  Sclera clear, no icterus. Ears:  Normal auditory acuity. Nose:  No deformity, discharge,  or lesions. Mouth:  No deformity or lesions, dentition normal. Neck:  Supple; no masses or thyromegaly. Lungs:  Clear throughout to auscultation. Heart:  Regular rate and rhythm; no murmurs, rubs,  or bruits. Abdomen:  Soft, nontender and nondistended. No masses, hepatosplenomegaly or hernias noted. Normal bowel sounds.without guarding and without rebound.   Rectal:  deferred until time of colonoscopy.   Msk:  Symmetrical with no gross deformities. Normal posture. Pulses:  Normal pulses noted. Extremities:  No clubbing, cyanosis, edema or deformities noted. Neurologic:  Alert and  oriented x4;  grossly normal neurologically. Skin:  Intact without significant lesions or rashes. Cervical Nodes:  No significant cervical adenopathy. Psych:  Alert and cooperative. Normal mood and affect.  Impression &  Recommendations:  Problem # 1:  SARCOIDOSIS (ICD-135) 50 y/o black [name] w/ multisystemic sarcoidosis w/ hepatic component who has not been seen in 2-1/2 yrs w/ hx non-compliance w/ follow-up.  He presents w/  reports of fecal incontinence in the shower.  Given his age & race, he is overdue for colonoscopy and we will proceed w/ this for further evaluation to r/o colorectal ca or polyp.  Discussed compliance issues, & he agrees to follow-up as directed.  Colonoscopy to be performed by Dr. Suszanne Conners Rourk in the near future.  I have discussed risks and benefits which include, but are not limited to, bleeding, infection, perforation, or medication reaction.  The patient agrees with this plan and consent will be obtained.  Orders: [facility] T-CBC w/Diff ([id number]) T-PT (Prothrombin Time) (09811) T-Hepatic Function (91478-29562) T-Basic Metabolic Panel (13086-57846) Est. Patient Level IV (96295)  Problem # 2:  FULL INCONTINENCE OF FECES (ICD-787.60) See #1  Orders: Est. Patient Level IV (28413)

## 2010-05-24 ENCOUNTER — Encounter: Payer: Medicare Other | Attending: Physical Medicine and Rehabilitation

## 2010-05-24 ENCOUNTER — Ambulatory Visit: Payer: Medicare Other

## 2010-05-24 DIAGNOSIS — F341 Dysthymic disorder: Secondary | ICD-10-CM | POA: Insufficient documentation

## 2010-05-24 DIAGNOSIS — M545 Low back pain, unspecified: Secondary | ICD-10-CM

## 2010-05-24 DIAGNOSIS — D869 Sarcoidosis, unspecified: Secondary | ICD-10-CM | POA: Insufficient documentation

## 2010-05-24 DIAGNOSIS — M79609 Pain in unspecified limb: Secondary | ICD-10-CM

## 2010-05-24 DIAGNOSIS — G43909 Migraine, unspecified, not intractable, without status migrainosus: Secondary | ICD-10-CM | POA: Insufficient documentation

## 2010-05-24 DIAGNOSIS — G894 Chronic pain syndrome: Secondary | ICD-10-CM

## 2010-05-24 DIAGNOSIS — Z79899 Other long term (current) drug therapy: Secondary | ICD-10-CM | POA: Insufficient documentation

## 2010-05-31 ENCOUNTER — Encounter: Payer: Self-pay | Admitting: Internal Medicine

## 2010-06-03 ENCOUNTER — Encounter: Payer: Self-pay | Admitting: Urgent Care

## 2010-06-07 ENCOUNTER — Ambulatory Visit: Payer: Medicare Other

## 2010-06-07 DIAGNOSIS — G894 Chronic pain syndrome: Secondary | ICD-10-CM

## 2010-06-07 DIAGNOSIS — M545 Low back pain, unspecified: Secondary | ICD-10-CM

## 2010-06-07 DIAGNOSIS — M79609 Pain in unspecified limb: Secondary | ICD-10-CM

## 2010-06-08 ENCOUNTER — Ambulatory Visit (HOSPITAL_COMMUNITY)
Admission: RE | Admit: 2010-06-08 | Discharge: 2010-06-08 | Disposition: A | Discharge status: Home / Self Care | Payer: Medicare Other | Source: Ambulatory Visit | Attending: Internal Medicine | Admitting: Internal Medicine

## 2010-06-08 ENCOUNTER — Encounter (INDEPENDENT_AMBULATORY_CARE_PROVIDER_SITE_OTHER): Payer: Self-pay

## 2010-06-08 ENCOUNTER — Encounter: Payer: Medicare Other | Admitting: Internal Medicine

## 2010-06-08 ENCOUNTER — Encounter: Payer: Self-pay | Admitting: Internal Medicine

## 2010-06-08 DIAGNOSIS — I10 Essential (primary) hypertension: Secondary | ICD-10-CM | POA: Insufficient documentation

## 2010-06-08 DIAGNOSIS — Z79899 Other long term (current) drug therapy: Secondary | ICD-10-CM | POA: Insufficient documentation

## 2010-06-08 DIAGNOSIS — Z538 Procedure and treatment not carried out for other reasons: Secondary | ICD-10-CM | POA: Insufficient documentation

## 2010-06-08 DIAGNOSIS — Z1211 Encounter for screening for malignant neoplasm of colon: Secondary | ICD-10-CM | POA: Insufficient documentation

## 2010-06-14 ENCOUNTER — Encounter: Payer: Medicare Other | Attending: Physical Medicine and Rehabilitation

## 2010-06-14 ENCOUNTER — Ambulatory Visit: Payer: Medicare Other

## 2010-06-14 DIAGNOSIS — M79609 Pain in unspecified limb: Secondary | ICD-10-CM

## 2010-06-14 DIAGNOSIS — M545 Low back pain, unspecified: Secondary | ICD-10-CM | POA: Insufficient documentation

## 2010-06-14 DIAGNOSIS — D869 Sarcoidosis, unspecified: Secondary | ICD-10-CM | POA: Insufficient documentation

## 2010-06-14 DIAGNOSIS — G8929 Other chronic pain: Secondary | ICD-10-CM | POA: Insufficient documentation

## 2010-06-14 DIAGNOSIS — G894 Chronic pain syndrome: Secondary | ICD-10-CM

## 2010-06-14 DIAGNOSIS — F341 Dysthymic disorder: Secondary | ICD-10-CM | POA: Insufficient documentation

## 2010-06-14 DIAGNOSIS — G43909 Migraine, unspecified, not intractable, without status migrainosus: Secondary | ICD-10-CM | POA: Insufficient documentation

## 2010-06-14 DIAGNOSIS — Z79899 Other long term (current) drug therapy: Secondary | ICD-10-CM | POA: Insufficient documentation

## 2010-06-15 NOTE — Op Note (Addendum)
  NAME:  Michael [name], Michael [name]                  ACCOUNT NO.:  [id number]  MEDICAL RECORD NO.:  [id number]           PATIENT TYPE:  O  LOCATION:  [facility]                          FACILITY:  [facility]  PHYSICIAN:  R. Roetta Sessions, M.D. DATE OF BIRTH:  01-Nov-1960  DATE OF PROCEDURE:  06/08/2010 DATE OF DISCHARGE:                              OPERATIVE REPORT   PROCEDURE:  Attempted colonoscopy.  INDICATIONS FOR PROCEDURE:  A 50 year old gentleman has had some recent complaints of cecal leak, (symptoms have since resolved) comes for his first ever screening colonoscopy.  No family history of colon cancer or colon polyps.  Colonoscopy is now being done as standard screening maneuver.  Risks, benefits, limitations, alternatives, and imponderable have been reviewed, questions were answered.  Please see documentation medical record.  PROCEDURE NOTE:  O2 saturation, blood pressure, pulse, and respirations monitored throughout the entire procedure.  CONSCIOUS SEDATION:  Versed 4 mg IV and Demerol 100 mg IV in divided doses.  INSTRUMENT:  Pentax video chip system.  FINDINGS:  Digital rectal exam revealed no abnormalities.  Endoscopic findings:  Prep was poor.  He appeared to have an unprepped colon, there were solid stool in the proximal rectum, occluding the lumen up in the sigmoid, it was obvious we were not going to be able to accomplish colonoscopy because of lack of prep.  The scope was withdrawn.  The patient was taken to the postop area.  [name] drinking 2 small glass of prep because of taste in "nasty."  IMPRESSION:  I aborted attempted colonoscopy secondary to lack of adequate prep, the patient's noncompliance.  RECOMMENDATIONS:  I discussed this with [name].  If he is interested and undergoing a colonoscopy in the future, he will have to comply with the prep instructions, and recommended he will contact us if he is interested in having colonoscopy done in the near future.     [name], M.D.     RMR/MEDQ  D:  06/08/2010  T:  06/08/2010  Job:  [id number]  Electronically Signed by Lorrin Goodell M.D. on 06/14/2010 02:42:47 PM Electronically Signed by Lorrin Goodell M.D. on 06/14/2010 03:23:41 PM Electronically Signed by Lorrin Goodell M.D. on 06/14/2010 03:48:02 PM Electronically Signed by Lorrin Goodell M.D. on 06/14/2010 04:26:08 PM Electronically Signed by Lorrin Goodell M.D. on 06/14/2010 07:36:58 PM

## 2010-06-16 NOTE — Letter (Addendum)
Summary: Normal Results Letter  [facility]  [address]   [place], [place] [address]   Phone: [phone]  Fax: [phone]    June 08, 2010  AMAHRI DENGEL [address] [place], [place]  [address]  [place] Sep 14, 1960   Dear Mr. Simms,   Our office has been trying to contact you.  We just wanted to inform you that all of your stool tests were normal.  If you have any questions, please call the office at [phone].   Thank you,    Hendricks Limes, LPN Cloria Spring, LPN  [facility] Ph: [phone]   Fax: [phone]   Appended Document: Normal Results Letter pt called and informed of the results- cdg

## 2010-06-24 ENCOUNTER — Ambulatory Visit: Payer: Medicare Other | Admitting: Physical Medicine and Rehabilitation

## 2010-06-24 ENCOUNTER — Encounter: Payer: Medicare Other | Attending: Physical Medicine and Rehabilitation

## 2010-06-24 DIAGNOSIS — G894 Chronic pain syndrome: Secondary | ICD-10-CM

## 2010-06-24 DIAGNOSIS — M545 Low back pain, unspecified: Secondary | ICD-10-CM | POA: Insufficient documentation

## 2010-06-24 DIAGNOSIS — M79609 Pain in unspecified limb: Secondary | ICD-10-CM | POA: Insufficient documentation

## 2010-06-27 ENCOUNTER — Other Ambulatory Visit: Payer: Self-pay | Admitting: Physical Medicine and Rehabilitation

## 2010-06-27 ENCOUNTER — Ambulatory Visit (HOSPITAL_COMMUNITY)
Admission: RE | Admit: 2010-06-27 | Discharge: 2010-06-27 | Disposition: A | Discharge status: Home / Self Care | Payer: Medicare Other | Source: Ambulatory Visit | Attending: Physical Medicine and Rehabilitation | Admitting: Physical Medicine and Rehabilitation

## 2010-06-27 DIAGNOSIS — M545 Low back pain, unspecified: Secondary | ICD-10-CM

## 2010-06-27 DIAGNOSIS — R1032 Left lower quadrant pain: Secondary | ICD-10-CM | POA: Insufficient documentation

## 2010-06-28 ENCOUNTER — Ambulatory Visit: Payer: Medicare Other

## 2010-07-08 ENCOUNTER — Emergency Department (HOSPITAL_COMMUNITY)
Admission: EM | Admit: 2010-07-08 | Discharge: 2010-07-08 | Disposition: A | Discharge status: Home / Self Care | Payer: Medicare Other | Attending: Emergency Medicine | Admitting: Emergency Medicine

## 2010-07-08 DIAGNOSIS — G8929 Other chronic pain: Secondary | ICD-10-CM | POA: Insufficient documentation

## 2010-07-08 DIAGNOSIS — M545 Low back pain, unspecified: Secondary | ICD-10-CM | POA: Insufficient documentation

## 2010-07-11 ENCOUNTER — Ambulatory Visit: Payer: Medicare Other | Admitting: Physical Medicine and Rehabilitation

## 2010-07-12 ENCOUNTER — Encounter: Payer: Medicare Other | Attending: Physical Medicine and Rehabilitation

## 2010-07-12 DIAGNOSIS — D869 Sarcoidosis, unspecified: Secondary | ICD-10-CM | POA: Insufficient documentation

## 2010-07-12 DIAGNOSIS — M545 Low back pain, unspecified: Secondary | ICD-10-CM | POA: Insufficient documentation

## 2010-07-12 DIAGNOSIS — F341 Dysthymic disorder: Secondary | ICD-10-CM | POA: Insufficient documentation

## 2010-07-12 DIAGNOSIS — Z79899 Other long term (current) drug therapy: Secondary | ICD-10-CM | POA: Insufficient documentation

## 2010-07-12 DIAGNOSIS — M729 Fibroblastic disorder, unspecified: Secondary | ICD-10-CM

## 2010-07-12 DIAGNOSIS — M79609 Pain in unspecified limb: Secondary | ICD-10-CM | POA: Insufficient documentation

## 2010-07-12 DIAGNOSIS — G894 Chronic pain syndrome: Secondary | ICD-10-CM

## 2010-07-14 ENCOUNTER — Other Ambulatory Visit: Payer: Self-pay | Admitting: Physical Medicine and Rehabilitation

## 2010-07-14 DIAGNOSIS — M79605 Pain in left leg: Secondary | ICD-10-CM

## 2010-07-20 ENCOUNTER — Ambulatory Visit (HOSPITAL_COMMUNITY)
Admission: RE | Admit: 2010-07-20 | Discharge: 2010-07-20 | Disposition: A | Discharge status: Home / Self Care | Payer: Medicare Other | Source: Ambulatory Visit | Attending: Physical Medicine and Rehabilitation | Admitting: Physical Medicine and Rehabilitation

## 2010-07-20 DIAGNOSIS — M545 Low back pain, unspecified: Secondary | ICD-10-CM | POA: Insufficient documentation

## 2010-07-20 DIAGNOSIS — M79605 Pain in left leg: Secondary | ICD-10-CM

## 2010-07-20 DIAGNOSIS — M79609 Pain in unspecified limb: Secondary | ICD-10-CM | POA: Insufficient documentation

## 2010-07-20 DIAGNOSIS — M5126 Other intervertebral disc displacement, lumbar region: Secondary | ICD-10-CM | POA: Insufficient documentation

## 2010-07-26 LAB — CBC
Platelets: 241 10*3/uL (ref 150–400)
WBC: 7.1 10*3/uL (ref 4.0–10.5)

## 2010-07-26 LAB — COMPREHENSIVE METABOLIC PANEL
AST: 37 U/L (ref 0–37)
Albumin: 3.4 g/dL — ABNORMAL LOW (ref 3.5–5.2)
Alkaline Phosphatase: 102 U/L (ref 39–117)
Chloride: 108 mEq/L (ref 96–112)
GFR calc Af Amer: >60 mL/min (ref >60)
Potassium: 4.3 mEq/L (ref 3.5–5.1)
Total Bilirubin: 0.4 mg/dL (ref 0.3–1.2)

## 2010-08-05 ENCOUNTER — Encounter
Payer: Medicare Other | Attending: Physical Medicine and Rehabilitation | Admitting: Physical Medicine and Rehabilitation

## 2010-08-05 DIAGNOSIS — M545 Low back pain, unspecified: Secondary | ICD-10-CM | POA: Insufficient documentation

## 2010-08-05 DIAGNOSIS — M79609 Pain in unspecified limb: Secondary | ICD-10-CM | POA: Insufficient documentation

## 2010-08-05 DIAGNOSIS — D869 Sarcoidosis, unspecified: Secondary | ICD-10-CM | POA: Insufficient documentation

## 2010-08-05 DIAGNOSIS — F17200 Nicotine dependence, unspecified, uncomplicated: Secondary | ICD-10-CM | POA: Insufficient documentation

## 2010-08-05 DIAGNOSIS — F341 Dysthymic disorder: Secondary | ICD-10-CM | POA: Insufficient documentation

## 2010-08-05 DIAGNOSIS — G894 Chronic pain syndrome: Secondary | ICD-10-CM

## 2010-08-05 DIAGNOSIS — Z79899 Other long term (current) drug therapy: Secondary | ICD-10-CM | POA: Insufficient documentation

## 2010-08-06 NOTE — Assessment & Plan Note (Signed)
HISTORY OF PRESENT ILLNESS:  Michael [name] is a 50 year old African American gentleman who has a history of multisystemic sarcoidosis with hepatic involvement.  He follows up with Dr. Aleen Campi for this.  He has had an L4-5 laminectomy, foraminotomy per Dr. Jeral Fruit on May 20, 2008.  He is back in today.  He has been having increased low back pain and bilateral leg pain over the last several months.  An MRI was ordered recently and carried out on July 20, 2010, showing interval right L4 laminotomy and posterior decompression with recurrent right lateral recess stenosis potentially affecting right L5 nerve root.  The patient complains of bitterly of low back pain and bilateral lower extremity pain.  He describes his back pain is about 10 on a scale of 10.  His leg pain about 8 on the left, and 6 on the right.  Pain is worse with walking.  He has been using one of his old spinal orthoses to help decrease his low back pain.  He is also using Lyrica 3 times a day as well as Vicodin 3 times a day.  The pain is typically worse with walking and standing, improves with rest and medication.  FUNCTIONAL STATUS:  He can walk 25-30 minutes.  He is able to climb stairs and drive.  He is independent with self-care, sometimes he needs help with his shoes.  REVIEW OF SYSTEMS:  Negative for problems controlling bowel or bladder. Denies suicidal ideation.  Otherwise negative.  No changes in past medical, social, or family history.  Medications prescribed through center for pain include Vicodin 5/325 t.i.d. p.r.n. back or leg pain and Lyrica 50 mg t.i.d.  MRI is reviewed with the patient today is done on July 20, 2010, and the results are attached to the chart.  PHYSICAL EXAMINATION:  VITAL SIGNS:  Blood pressure is 138/86, pulse 74, respirations 18, 99% saturated on room air. GENERAL:  He is a well-developed, well-nourished [place] American gentleman who does not appear in any distress,  although he does prefer to stand throughout most of the interview.  He is oriented x3. NEUROLOGICAL:  Speech is clear.  His affect is bright.  He is alert, cooperative, and pleasant.  Follows commands without difficulty, answers my questions appropriately.  Cranial nerves and coordination are grossly intact.  His reflexes are 2+ at patellar tendons, 1+ at the right Achilles tendon, diminished at the left Achilles tendon.  Straight leg raise is negative. MUSCULOSKELETAL:  He has good motor strength in both lower extremities without obvious focal deficit at hip flexors, knee extensors, dorsiflexors, EHL, and evertors.  He is able to transition easily from sitting to standing.  Gait is non antalgic.  Tandem gait and Romberg test are performed adequately.  He has limitations in lumbar motion in all planes.  He complains of pain with palpation on the lower lumbar segments and especially in the right paraspinal musculature.  IMPRESSION: 1. Increased pain in low back and legs over the last couple of months.     He is status post an L4-5 laminectomy/foraminotomy Dr. Jeral Fruit     May 20, 2008, recent MRI scan is attached to the chart. 2. History of depression and anxiety. 3. History of nicotine addiction. 4. History of systemic sarcoidosis followed by Dr. Aleen Campi.  PLAN:  I reviewed treatment options with Michael [name].  He is currently comfortable on the current medications that he is taking that is Lyrica 3 times a day 50 mg as well as 5/325 Vicodin  3 times a day.  We discussed consideration of medial branch blocks for low back pain verses epidural steroid injection for leg pain.  He tells me he is not interested in injections.  He is adamant, he would like to follow back up with his previous spine surgeon.  We will see Michael [name] back in 1 month.  We will refer him back to Dr. Jeral Fruit for evaluation.  I have answered all his questions.  We will continue to monitor his pill count as well as  random urine drug screens.  He is currently comfortable with this treatment plan.  He finds that using his previous spinal orthoses is somewhat helpful.     Brantley Stage, M.D.    DMK/MedQ D:  08/05/2010 12:55:00  T:  08/06/2010 16:10:96  Job #:  [id number]

## 2010-08-23 NOTE — Assessment & Plan Note (Signed)
NAMEMarland Kitchen  SUHAN, PACI                   CHART#:  [id number]   DATE:  03/25/2007                       DOB:  Mar 01, 1961   CHIEF COMPLAINT:  Followup hepatic sarcoid.   SUBJECTIVE:  The patient is a 49 year old Caucasian male.  He is here  for followup office visit.  He has history of multisystem sarcoidosis  which has been stable over several years now.  He had hepatic  involvement.  He had liver function tests recently which actually looked  very good.  His alkaline phosphatase was 159.  This is down from 185.  Otherwise, his liver function tests were normal.  He had a negative AMA.  He is doing well.  He does note being under a significant amount of  stress.  He has had some death in his family.  He and his wife have been  separated for 9 months, and he has 2 teenage children residing in his  home that he is helping take care of.  He is disabled.  He has been seen  by Dr. Romeo Apple for his low back pain.  He did receive 2 injections  since he was last seen by Dr. Jena Gauss.  His bowel movements are normal,  soft, and brown.  Denies any clay-colored stool, denies any pruritus,  denies any jaundice.  He is going to be seen by pain clinic in  [place] for his chronic back pain issues.   CURRENT MEDICATIONS:  See updated list from 03/25/2007.   ALLERGIES:  METHADONE, PHENTANYL MORPHINE PATCH.   OBJECTIVE:  Weight 136.5pounds, height 74 inches, temp 97.6, blood  pressure 170, and pulse 68.  GENERAL:  The patient is a well-developed, well-nourished African-  American male in no acute distress.  HEENT:  Throat clear.  Conjunctivae pink.  Oropharynx pink and most  without any lesions.  CHEST:  Heart regular rate and rhythm, normal S1, S2.  ABDOMEN:  Positive bowel sounds x4.  No bruits auscultated.  Soft,  nontender, nondistended, without palpable mass or hepatosplenomegaly.  No rebound, tenderness, or guarding.  EXTREMITIES:  Without clubbing or edema bilaterally.   ASSESSMENT:  Hepatic  sarcoid/multisystem sarcoidosis which is stable.  Liver function tests with mildly elevated alkaline phosphatase,  otherwise normal.  Overall, he is doing very well.  He is having some  right lower extremity pain not mentioned above, and I have asked him to  follow up with a primary care Stevan Eberwein.  He is also going to be seen by  pain clinic for his chronic back pain.   PLAN:  1. Check liver function tests and office visit with Dr. Jena Gauss in 6      months.  2. He has been given phone numbers to several primary care physicians      in [place],      including Dr. Jen Mow, Dr. Erby Pian and Dr. Margo Aye to establish a      primary care Mykenzi Vanzile.  3. He is going to follow up with the pain clinic regarding his chronic      low back pain.       Lorenza Burton, N.P.  Electronically Signed     R. Roetta Sessions, M.D.  Electronically Signed    KJ/MEDQ  D:  03/26/2007  T:  03/26/2007  Job:  [id number]   cc:   Darleen Crocker  A. Gerilyn Pilgrim, M.D.

## 2010-08-23 NOTE — Op Note (Signed)
NAMELAQUON, EMEL                  ACCOUNT NO.:  [id number]   MEDICAL RECORD NO.:  [id number]          PATIENT TYPE:  INP   LOCATION:  3023                         FACILITY:  [facility]   PHYSICIAN:  Hilda Lias, M.D.   DATE OF BIRTH:  November 20, 1960   DATE OF PROCEDURE:  05/20/2008  DATE OF DISCHARGE:                               OPERATIVE REPORT   PREOPERATIVE DIAGNOSIS:  L4-L5 and L5-S1 foraminal stenosis with chronic  radiculopathy.   POSTOPERATIVE DIAGNOSIS:  L4-L5 and L5-S1 foraminal stenosis with  chronic radiculopathy.   PROCEDURE:  Bilateral L4-L5 laminotomy and foraminotomy to decompress  the L4-L5 and S1 nerve root.  Microscope.   SURGEON:  Hilda Lias, MD   ASSISTANT:  Coletta Memos, MD   CLINICAL HISTORY:  Mr. Maisel is a 50 year old gentleman complaining of  lower back pain associated with pain and tingling sensation going to  both lower extremities.  The patient has failed conservative treatment  including epidural injection.  X-ray showed that he had stenosis at L4-  5, 5-1.  The patient wants to proceed with surgery.   PROCEDURE:  The patient was taken to the OR and was positioned in prone  manner.  The back was cleaned with DuraPrep.  A midline incision from L4-  L5 was made and muscles were retracted laterally until we were able to  see the L4-L5 and L5-S1 space.  We brought the microscope to the area  and with the drill we did a laminotomy at 4-5 and 5-1.  A thick  calcified yellow ligament was also excised.  We started on the right  side using the 2 and 3 mm Kerrison punch and decompression of the thecal  sac as well as the L4 and L5 nerve root was done.  The same procedure  was done at the L5-1 on the right side.  The yellow ligament was  calcified and was probably  the source of the stenosis.  The same  procedure was done in the left side.  At the end, we had a good  decompression of the L4, L5, and S1 nerve root.  Valsalva maneuver was  negative.   Depo-Medrol and fentanyl were left in the epidural space and  the wound was closed with Vicryl and Steri-Strips.           ______________________________  Hilda Lias, M.D.     EB/MEDQ  D:  05/20/2008  T:  05/21/2008  Job:  [id number]

## 2010-08-23 NOTE — Assessment & Plan Note (Signed)
NAMEMarland Kitchen  [name], [name]                   CHART#:  [id number]   DATE:  10/03/2007                       DOB:  [birth year]   Followup multisystem sarcoid and history of hepatosplenomegaly.   The patient has done well.  He really has not had any GI problems.  He  has been off prednisone for some time.  He has been followed closely by  Dr. Gerilyn Pilgrim and Dr. Orson Aloe; Dr. Jen Mow is now his primary care  Michael [name].  Last set of LFTs from 03/25/2007; all parameters normal  except for alkaline phosphatase of 159, was down from 185 previous.  His  antimitochondrial antibody came back negative.  He has overall done very  well.   CURRENT MEDICATIONS:  See updated list.   ALLERGIES:  1. METHADONE.  2. FENTANYL.  3. MORPHINE.   PHYSICAL EXAMINATION:  GENERAL:  Today, he appears at his baseline.  VITAL SIGNS:  Weight 165, height 6 feet 2 inches, temperature 98.1, BP  100/80, and pulse 64.  SKIN:  Warm and dry.  CHEST:  Lungs are clear to auscultation.  CARDIAC:  Regular rate and rhythm without murmur, gallop, or rub.  ABDOMEN:  Flat, positive bowel sounds, soft, nontender.  No appreciable  mass or hepatosplenomegaly.   ASSESSMENT:  Multisystem sarcoid producing cholestatic enzyme pattern,  much improved more recently.  Sarcoid appears to be in more or less  remission these days.   As per the record, he did undergo a colonoscopy back in 2005, primarily  for weight loss/screening.  He was found to have minimal internal  hemorrhoids, otherwise normal colon and terminal ileum.  He would be due  for routine screening in 2015 or age 89.  We will check  hepatic profile today.  Unless something comes up, I plan to see this  nice gentleman back in 1 year.       Michael [name], M.D.  Electronically Signed     RMR/MEDQ  D:  10/03/2007  T:  10/04/2007  Job:  [id number]   cc:   Erle Crocker, M.D.  Elise Benne, M.D.  Kofi A. Gerilyn Pilgrim, M.D.
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
## 2010-08-26 NOTE — Op Note (Signed)
NAME:  Michael [name], Michael [name]                            ACCOUNT NO.:  [id number]   MEDICAL RECORD NO.:  [id number]                   PATIENT TYPE:  AMB   LOCATION:  DAY                                  FACILITY:  [facility]   PHYSICIAN:  R. Roetta Sessions, M.D.              DATE OF BIRTH:  Jun 30, 1960   DATE OF PROCEDURE:  11/05/2003  DATE OF DISCHARGE:                                 OPERATIVE REPORT   PROCEDURE PERFORMED:  Colonoscopy, diagnostic and screening.   INDICATIONS FOR PROCEDURE:  The patient is a 50 year old gentleman with  history of significant weight loss, etiology unknown, history of  sarcoidosis, and elevated alkaline phosphatase.  Recent fractionation of  alkaline phosphatase revealed a total level elevated at 411 with both liver  and bone fractions elevated at 349 and 62 respectively (liver fraction  elevated out of proportion to bone fraction).  Colonoscopy is now being done  in part for screening, in part to evaluate weight loss.  This approach had  been discussed with the patient previously and again at his bedside.  Please  see my October 06, 2003 history and physical for more information.   DESCRIPTION OF PROCEDURE:  Oxygen saturations, blood pressure pulse and  respirations were monitored throughout the entirety of the procedure.  Conscious sedation with Versed 5 mg IV, Demerol 100 mg IV in divided doses.  Instrument Olympus video chip system.   FINDINGS:  Digital exam revealed no abnormalities.  Endoscopic findings:  Prep was excellent.   Rectum:  Examination of rectal mucosa including a retroflex view of the anal  verge revealed only some minimal internal hemorrhoids.   Colon:  Colonic mucosa was surveyed from the rectosigmoid junction through  the left transverse and right colon to the area of the appendiceal orifice,  ileocecal valve and cecum.  These structures were well seen and photographed  for the record. The terminal ileum was intubated to 10 cm.  From the level  of the cecum and ileocecal valve the scope was slowly withdrawn.  All  previously mentioned mucosal surfaces were once again seen.  The colonic  mucosa appeared entirely normal as did the terminal ileal mucosa.  The  patient tolerated the procedure well and was reacted in endoscopy.   IMPRESSION:  1. Minimal internal hemorrhoids, otherwise normal rectum.  2. Normal colon.  3. Normal terminal ileum.   Based on the patient's report, some of his weight loss has stabilized and  actually he has gained weight.  He really does not have any GI symptoms at  this time.  He has reported splenomegaly on prior CT.  We had scheduled him  for an abdominal CT but he elected not to have it done.  He may have an  element of granulomatous liver disease, secondary to sarcoidosis.   Recommendation at this point is simply make arrangements for him to have a  repeat set of  LFTs when we see him back in the office in eight weeks to  assess his progress and will make further recommendations at that time.  As  far as colorectal cancer screening is concerned, he really does not need to  have another colonoscopy for 10 years.      ___________________________________________                                            [name], M.D.   RMR/MEDQ  D:  11/05/2003  T:  11/05/2003  Job:  [id number]   cc:   Darleen Crocker A. Gerilyn Pilgrim, M.D.  [address], [place]  [place]  [place] [address]  Fax: [phone]

## 2010-08-26 NOTE — Op Note (Signed)
NAME:  Michael [name], Michael [name]                  ACCOUNT NO.:  [id number]   MEDICAL RECORD NO.:  [id number]          PATIENT TYPE:  AMB   LOCATION:  DAY                           FACILITY:  [facility]   PHYSICIAN:  R. Roetta Sessions, M.D. DATE OF BIRTH:  1960-11-29   DATE OF PROCEDURE:  03/22/2004  DATE OF DISCHARGE:                                 OPERATIVE REPORT   PROCEDURE:  Esophagogastroduodenoscopy with small-bowel biopsy.   INDICATIONS FOR PROCEDURE:  The patient is 50 year old gentleman with weight  loss, recent colonoscopy demonstrating no significant findings. He continues  to loose weight. He has a history of multisystem sarcoidosis. Recent CT scan  demonstrated large abnormal appearing spleen and liver, suggestion of portal  hypertension and varices. EGD is now being done to further evaluate his  symptoms and the abnormal CT findings. The approach has been discussed with  the patient. Potential risks, benefits, and alternatives have been reviewed.  Questions answered. Please see my February 18, 2004 office dictation and my  handwritten updated H&P today.   PROCEDURE:  O2 saturation, blood pressure, pulse, and respirations were  monitored throughout the entire procedure. Conscious sedation with Versed 3  mg IV and Demerol 75 mg IV   INSTRUMENT:  Olympus video chip system.   FINDINGS:  Examination of the tubular esophagus revealed no mucosal  abnormalities. No evidence of esophageal varices or other abnormality. EG  junction easily transversed.   Stomach:  The gastric cavity was empty and insufflated well with air.  Thorough examination of the gastric mucosal including retroflection to view  the proximal stomach and esophagogastric junction demonstrated only a small  hiatal hernia. Pylorus patent and easily transversed. Examination of bulb  and second and third portion revealed no mucosal abnormalities.   THERAPY/DIAGNOSTIC MANEUVERS:  Multiple biopsies of the second and third  portion  were taken to rule out villous atrophy. The patient tolerated the  procedure well and was reactive to endoscopy.   IMPRESSION:  Normal esophagogastroduodenoscopy. Small hiatal hernia.  Otherwise normal stomach. Normal appearing D1, D2, and D3. Biopsies of D2  and D3 taken.   RECOMMENDATIONS:  Will follow up on biopsies. Will make further  recommendations in the very near future.     Otelia Sergeant   RMR/MEDQ  D:  03/22/2004  T:  03/22/2004  Job:  [id number]   cc:   Cherie Ouch  [address] [place]. [place]  [place] [address]  Fax: [phone]   [facility]

## 2010-08-26 NOTE — Assessment & Plan Note (Signed)
DATE OF SERVICE:  April 20, 2004.   MEDICAL RECORD NUMBER:  [id number].   DATE OF BIRTH:  Jun 16, 1960.   Michael [name] is a 50 year old, black gentleman who was last seen on March 14, 2004.   He has a history of bilateral low back and leg pain.   He reports that the Lidoderm that he was placed on at last visit does not  help him at all.  In fact, it gives him headaches and when he puts it on it  makes his overall pain actually worse.   He is currently requesting oxycodone.  He says he has tried other things in  the past and that is the only thing that works for him.   There is a family member who approached the nursing staff today and reports  that Michael [name] does have a history of diversion.   Michael [name] says his average is a 10 on a scale of 10.  He reports he is  currently a 10 right now.  He feels his pain completely interferes with  activity and enjoyment of life.  His sleep is overall poor.  The pain is  worse with walking, bending, sitting, activity and standing.  He reports he  gets complete relief from his medicines.  The pain is located in the low  back and extends down the posterior aspect and anterior aspect of both legs.  He has pain in the left leg and numbness in the right leg.   He reports he can walk about 10-15 minutes at a time.  He does not drive  currently.  He is able to walk without assistance.  He has been disabled  since 2000.  He does not require any assistance with ADLs.  He lives alone.  Denies any suicidal ideation.  Denies any problems controlling bowel or  bladder.  He does report night sweats.  He does have a history of sarcoid  involving his liver.  He has a history of splenomegaly and smaller right  kidney, as well as some hilar lymphadenopathy.   Recent review of scans which were done on November 29, 2003, of his lumbar  spine, lumbar MRI, showed some abnormal replacement of bone marrow at  multiple vertebral bodies, most extensively at  L4.  A followup bone scan was  done which was reviewed.  This exam was done on November 30, 2003.  The  lesions that were seen on MRI do not show any abnormality by nuclear  medicine scan, however, they did recommend a followup MRI in two to three  months to look for any progression of these lesions.  I discussed this with  Michael [name] today.   PHYSICAL EXAMINATION:  His vital signs today are as follows:  Blood pressure  119/82, pulse 84, respirations 20, 99% saturated on room air.  He is a thin  adult male in no apparent distress during our interview.  He is oriented x  3.  His affect is rather flat.  He is able to get out of the chair without  difficulty.  His gait is normal in the room.  He has mild limitations with  forward flexion, extension and lateral flexion.  No antalgia with his gait  at all.  He reports exquisite tenderness with light palpation over the  entire lumbar paraspinal musculature.  His motor strength at hip flexors,  knee extensors, dorsiflexors, plantar flexors and EHL are all 5/5.  He has  2+ reflexes are the  knees and 0-1+ at the ankles.  Straight leg raise is  negative.   IMPRESSION:  1.  Chronic low back pain.  2.  Bilateral lower extremity pain.  3.  Abnormal MRI on October 29, 2003.   PLAN:  1.  Will go ahead and ordered serum protein electrophoresis on this      gentleman.  2.  Will repeat MRI of the lumbar spine.  3.  Will add Elavil 10 mg q.h.s. to help him with sleep at night.  4.  Will also add Neurontin 300 mg one p.o. q.h.s.  5.  Will see him back after these studies are completed within the next      month.       DMK/MedQ  D:  04/20/2004 16:48:17  T:  04/20/2004 19:08:36  Job #:  [id number]

## 2010-08-26 NOTE — Assessment & Plan Note (Signed)
MEDICAL RECORD NUMBER:  [id number].   [name] is a 50 year old black gentleman who is a patient of Dr. Karleen Dolphin  who is a pulmonary specialist as well as Dr. Morrie Sheldon.   He is back in for a recheck. He was last seen April 20, 2004. MRI was  obtained at the last visit. We have those results, and those results were  reviewed with [name] today. Essentially, MRI was compared with a study  done October 29, 2003 and read by Dr. Alice Reichert who noted that there are  multiple areas of abnormal signal scattered throughout the lumbar and sacral  marrow and that these has increased in number compared to the previous study  and that these are concerning for a metastatic process. These results were  reviewed with [name]. He has been set up to see a neurosurgeon. Mr.  Michael [name] disclosed to me for the first time today that he was seeing possibly  a blood specialist, Dr. Gerilyn Pilgrim, in [place], [place]. Apparently, they  had a falling out due to [name] being switched to a fentanyl patch  rather than p.o. medications.   There was also some blood work done on [name] including a serum protein  electrophoresis and a CBC. He has multiple abnormalities, and I would like  him to followup with a hematologist/oncologist. He is aware of this.   Apparently, his appointment with the neurosurgeon is dated June 06, 2004  at [facility].   Since I last saw Michael [name], he reports he did have a slip and a fall  several days ago. He was seen in the emergency room. Some x-rays were taken  which were negative for any kind of fracture in the shoulder area.   [name] pain is mainly in the left low back area. He has bilateral leg  pain as well. He describes his pain as fairly intense. He has trouble  describing it in terms of sharp burning or stabbing. He overall has been  sleeping well. Average pain has been about a 9 on a scale of 10. Pain is  worse with most activities, improves with heat.   He is  able to walk without assistance. He will occasionally use a walker. He  is able to climb stairs. He does not drive. Denies suicidal ideation. Denies  depression or anxiety. Denies problems controlling bowel or bladder.   No new changes in past medical, social, or family history since last visit.   PHYSICAL EXAMINATION:  VITAL SIGNS:  Blood pressure 126/85, pulse 74,  respirations 18, 100% saturated on room air.  GENERAL:  He is a thin, tall, black gentleman who does not appear to be in  any stress during our interview. He is oriented to person, place, and date.  His affect is overall bright and upbeat.   He is able to independently stand from seated position. His gait in the room  is normal. Stride length is equal, equal weight bearing. Romberg's test is  negative. He has some discomfort with forward flexion and extension  basically in all planes. He has some tenderness throughout the mid thoracic  and upper lumbar areas with percussion. Seated, reflexes are 2+ overall. No  increased tone is noted. Toes are equivocal with plantar stimulation. He  denies diminished sensation in the upper extremities; does, however, report  decreased sensation from about T10 down to his feet. Motor strength,  however, is quite good overall, 5/5 without any obvious focal deficits.   IMPRESSION:  Chronic low back pain with abnormalities on MRI scan. Please  refer to scan done April 25, 2004, and a CBC and serum protein  electrophoresis abnormalities, please refer to lab dated April 20, 2004.   These were all discussed with [name]. Would like him to followup with  HEM/ONC as well as neurosurgery. Will refill amitriptyline for him today as  well as Neurontin. Will increase that over the next month. May consider  Duragesic with him as well as possible TENS unit for pain management. Will  see him back then in a month. Will also in the meantime attempt to contact  Dr. Gerilyn Pilgrim in [place]; phone number  [phone].      DMK/MedQ  D:  05/20/2004 11:24:21  T:  05/20/2004 11:58:07  Job #:  [id number]
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
## 2010-08-26 NOTE — Assessment & Plan Note (Signed)
MEDICAL RECORD NUMBER:  [id number].   [name] is a 50 year old black male who is a patient of Dr. Steele Berg who is  a pulmonary specialist.   [name] was last seen February 10. He is back today for a recheck and  review of his MRI scans. He has abnormalities noted in his thoracic spine as  well involving vertebral bodies, posterior element. Unclear whether this is  related to his sarcoid or metastatic disease per radiology report. Also  abnormal signal noted in the cervical cord. This was reviewed with Mr.  Michael [name] today.   He continues to complain of diffuse back pain, 10 on a scale of 10. Pain is  constant, exacerbated by pretty much every activity with very little  improvement. He is currently on Neurontin and amitriptyline. He denies  problems controlling bowel or bladder. Denies suicidal ideation. Does admit  to weakness, numbness, tingling, trouble walking and spasms. He admits to  weight loss.   PHYSICAL EXAMINATION:  This is a tall, thin, black gentleman. He is oriented  x3. He is bright and alert, somewhat anxious in affect. He is able to stand  independently from a seated position. Gait is normal. He has diffuse  tenderness with palpation over his cervical lumbar spine and somewhat  through his thoracic spine also with palpation. Reflexes are 2+ and  symmetric. Toes are downgoing. Normal tone is noted. He has a negative  Romberg's test. Motor strength is excellent in upper and lower extremities.   IMPRESSION:  Diffuse back pain with weight loss with abnormal thoracic and  lumbar MRI and abnormal CBC. Recommend followup with hematology for further  evaluation. Will have copies of our note and her labs and thoracic and  lumbar MRI sent over to [facility] and will see [name] after he has been  evaluated by them.      DMK/MedQ  D:  06/15/2004 18:18:55  T:  06/16/2004 09:59:26  Job #:  [id number]

## 2010-08-31 ENCOUNTER — Other Ambulatory Visit: Payer: Self-pay | Admitting: Neurosurgery

## 2010-08-31 DIAGNOSIS — M549 Dorsalgia, unspecified: Secondary | ICD-10-CM

## 2010-09-01 ENCOUNTER — Ambulatory Visit
Admission: RE | Admit: 2010-09-01 | Discharge: 2010-09-01 | Disposition: A | Discharge status: Home / Self Care | Payer: Medicare Other | Source: Ambulatory Visit | Attending: Neurosurgery | Admitting: Neurosurgery

## 2010-09-01 DIAGNOSIS — M549 Dorsalgia, unspecified: Secondary | ICD-10-CM

## 2010-09-06 ENCOUNTER — Encounter: Payer: Medicare Other | Attending: Physical Medicine and Rehabilitation

## 2010-09-06 DIAGNOSIS — F341 Dysthymic disorder: Secondary | ICD-10-CM | POA: Insufficient documentation

## 2010-09-06 DIAGNOSIS — D869 Sarcoidosis, unspecified: Secondary | ICD-10-CM | POA: Insufficient documentation

## 2010-09-06 DIAGNOSIS — M545 Low back pain, unspecified: Secondary | ICD-10-CM | POA: Insufficient documentation

## 2010-09-06 DIAGNOSIS — G894 Chronic pain syndrome: Secondary | ICD-10-CM

## 2010-09-06 DIAGNOSIS — M79609 Pain in unspecified limb: Secondary | ICD-10-CM | POA: Insufficient documentation

## 2010-09-07 NOTE — Assessment & Plan Note (Signed)
[name] is a pleasant 50 year old gentleman who is followed at our [facility] for chronic low back pain and intermittent pain into his lower extremities.  He is status post C4-5 laminectomy/foraminotomy.  He also sees Dr. Jena Gauss for multisystem sarcoidosis with hepatic involvement.  His primary care doctor is Dr. Aleen Campi.  Michael [name] is back in today for refill of his pain medications.  He has been using 2 Vicodin for a total of 60 pills typically each month.  He also uses Lyrica 50 mg twice a day.  He reports the Lyrica is helping quite a bit, however, he has developed some low back pain over the last few weeks which has been bothering quite a bit.  He denies any history of trauma or lifting injury and it just came on out of the blue.  He denies problems controlling bowel or bladder. Denies any new numbness, tingling or weakness in the lower extremities. He reports he has some trouble walking because of low back is bothering him quite a bit.  His pain scores are 10 on a scale of 10.  He tells me his activity level is significantly impacted by this recent low back pain.  Pain is typically worse in the day and evening.  It is worse when he is up walking.  He is getting minimal relief at this time.  Functional status:  He can walk about 5 minutes at a time.  He is able to drive.  He is independent with self-care.  He denies problems controlling bowel or bladder.  Denies suicidal ideation.  He also tells me he has been rather stressed a lot of people in his community reliant him for help and he is feeling overwhelmed with all of his family and friends needs.  No other changes in past medical, social or family history.  He continues to smoke about a half pack of cigarettes a day.  Denies alcohol use.  Denies illegal substance use.  [facility] through [facility] include Lyrica 50 mg twice a day and Vicodin 5/325 one p.o.  b.i.d. p.r.n. back pain or neck pain.  Exam today, blood pressure is 136/72, pulse 74, respirations 18, 100% saturation on room air.  He is a well-developed, well-nourished gentleman who appears somewhat tired today and more depressed.  He is alert.  He is cooperative and pleasant.  He follows commands without difficulty.  Answers my questions appropriately.  Cranial nerves and coordination are intact.  His reflexes are generally 1+ in the upper extremities, 2+ at the patellar and Achilles tendon.  No abnormal tone, clonus, or tremors are noted.  His motor strength is quite good in the lower extremities, without focal deficit at hip flexors, knee extensors, dorsiflexors, plantar flexors or EHL.  Transitioning from sitting to standing, he does appear to be somewhat slower today.  He has increased amount of pain behavior getting out of the chair.  His gait is slow, but stable.  Tandem gait and Romberg tests are performed adequately.  He is very limited lumbar motion today compared to other visits.  Limited flexion is noted as well as extension.  IMPRESSION: 1. Flare-up of low back pain.  He is status post an L4-5     laminectomy/foraminotomy Dr. Jeral Fruit on May 20, 2008. 2. History of depression and anxiety. 3. History of nicotine addiction. 4. History of sarcoid. 5. Intermittent bilateral lower extremity pain and chronic low back     pain.  His medical  history is significant for multisystem sarcoidosis with hepatic involvement followed by doctors Rourk as well as Environmental health practitioner.  PLAN:  We will increase his pain medication slightly to one more pill a day of Vicodin 5/325 t.i.d. p.r.n.  We will obtain lumbar flexion/extension radiographs as well as an AP.  I will see him back after these radiographs are completed.  Michael [name] has had urine drug screen in April 04, 2010 as well as May 12, 2010.  Both were consistent and appropriate.  We will continue to monitor his pill  counts and monitor random urine drug screens as well.  He is comfortable with our plan at this time.     Brantley Stage, M.D. Electronically Signed    DMK/MedQ D:  06/24/2010 13:56:03  T:  06/25/2010 00:06:04  Job #:  [id number]

## 2010-09-19 ENCOUNTER — Encounter (HOSPITAL_COMMUNITY)
Admission: RE | Admit: 2010-09-19 | Discharge: 2010-09-19 | Disposition: A | Discharge status: Home / Self Care | Payer: Medicare Other | Source: Ambulatory Visit | Attending: Neurosurgery | Admitting: Neurosurgery

## 2010-09-19 LAB — COMPREHENSIVE METABOLIC PANEL
ALT: 22 U/L (ref 0–53)
Calcium: 10.2 mg/dL (ref 8.4–10.5)
Creatinine, Ser: 1.22 mg/dL (ref 0.4–1.5)
GFR calc Af Amer: >60 mL/min (ref >60)
Glucose, Bld: 93 mg/dL (ref 70–99)
Sodium: 139 mEq/L (ref 135–145)
Total Protein: 7.8 g/dL (ref 6.0–8.3)

## 2010-09-19 LAB — CBC
Hemoglobin: 14.9 g/dL (ref 13.0–17.0)
MCH: 32.4 pg (ref 26.0–34.0)
MCHC: 36.1 g/dL — ABNORMAL HIGH (ref 30.0–36.0)
MCV: 89.8 fL (ref 78.0–100.0)

## 2010-09-19 LAB — SURGICAL PCR SCREEN
MRSA, PCR: NEGATIVE
Staphylococcus aureus: NEGATIVE

## 2010-09-20 LAB — DIFFERENTIAL
Basophils Absolute: 0.1 10*3/uL (ref 0.0–0.1)
Basophils Relative: 0 % (ref 0–1)
Eosinophils Relative: 0 % (ref 0–5)
Lymphs Abs: 3 10*3/uL (ref 0.7–4.0)
Monocytes Relative: 9 % (ref 3–12)
Neutro Abs: 18.7 10*3/uL — ABNORMAL HIGH (ref 1.7–7.7)
WBC Morphology: INCREASED

## 2010-09-21 ENCOUNTER — Other Ambulatory Visit: Payer: Self-pay | Admitting: Neurosurgery

## 2010-09-21 ENCOUNTER — Ambulatory Visit (HOSPITAL_COMMUNITY)
Admission: RE | Admit: 2010-09-21 | Discharge: 2010-09-21 | Disposition: A | Discharge status: Home / Self Care | Payer: Medicare Other | Source: Ambulatory Visit | Attending: Neurosurgery | Admitting: Neurosurgery

## 2010-09-21 ENCOUNTER — Inpatient Hospital Stay (HOSPITAL_COMMUNITY)
Admission: RE | Admit: 2010-09-21 | Discharge: 2010-09-22 | DRG: 491 | Disposition: A | Discharge status: Home / Self Care | Payer: Medicare Other | Source: Ambulatory Visit | Attending: Neurosurgery | Admitting: Neurosurgery

## 2010-09-21 ENCOUNTER — Inpatient Hospital Stay (HOSPITAL_COMMUNITY): Payer: Medicare Other

## 2010-09-21 ENCOUNTER — Other Ambulatory Visit (HOSPITAL_COMMUNITY): Payer: Self-pay | Admitting: Neurosurgery

## 2010-09-21 DIAGNOSIS — M5126 Other intervertebral disc displacement, lumbar region: Secondary | ICD-10-CM

## 2010-09-21 DIAGNOSIS — D869 Sarcoidosis, unspecified: Secondary | ICD-10-CM | POA: Diagnosis present

## 2010-09-21 DIAGNOSIS — J99 Respiratory disorders in diseases classified elsewhere: Secondary | ICD-10-CM | POA: Diagnosis present

## 2010-09-21 DIAGNOSIS — F341 Dysthymic disorder: Secondary | ICD-10-CM | POA: Diagnosis present

## 2010-09-21 DIAGNOSIS — D72829 Elevated white blood cell count, unspecified: Secondary | ICD-10-CM | POA: Diagnosis present

## 2010-09-21 DIAGNOSIS — F17200 Nicotine dependence, unspecified, uncomplicated: Secondary | ICD-10-CM | POA: Diagnosis present

## 2010-09-21 LAB — DIFFERENTIAL
Basophils Absolute: 0.1 10*3/uL (ref 0.0–0.1)
Basophils Relative: 0 % (ref 0–1)
Eosinophils Absolute: 0.2 10*3/uL (ref 0.0–0.7)
Neutro Abs: 10.1 10*3/uL — ABNORMAL HIGH (ref 1.7–7.7)
Neutrophils Relative %: 64 % (ref 43–77)

## 2010-09-21 LAB — CBC
Hemoglobin: 14.7 g/dL (ref 13.0–17.0)
Platelets: 299 10*3/uL (ref 150–400)
RBC: 4.63 MIL/uL (ref 4.22–5.81)
WBC: 15.9 10*3/uL — ABNORMAL HIGH (ref 4.0–10.5)

## 2010-09-21 LAB — URINE MICROSCOPIC-ADD ON

## 2010-09-21 LAB — URINALYSIS, ROUTINE W REFLEX MICROSCOPIC
Glucose, UA: NEGATIVE mg/dL
Nitrite: NEGATIVE
pH: 6 (ref 5.0–8.0)

## 2010-09-22 LAB — DIFFERENTIAL
Basophils Absolute: 0 10*3/uL (ref 0.0–0.1)
Basophils Relative: 0 % (ref 0–1)
Lymphocytes Relative: 9 % — ABNORMAL LOW (ref 12–46)
Monocytes Absolute: 0.7 10*3/uL (ref 0.1–1.0)
Neutro Abs: 15.4 10*3/uL — ABNORMAL HIGH (ref 1.7–7.7)
Neutrophils Relative %: 87 % — ABNORMAL HIGH (ref 43–77)

## 2010-09-22 LAB — HIV ANTIBODY (ROUTINE TESTING W REFLEX): HIV: NONREACTIVE

## 2010-09-22 LAB — CBC
HCT: 39.2 % (ref 39.0–52.0)
Hemoglobin: 13.6 g/dL (ref 13.0–17.0)
MCHC: 34.7 g/dL (ref 30.0–36.0)
RBC: 4.36 MIL/uL (ref 4.22–5.81)
WBC: 17.7 10*3/uL — ABNORMAL HIGH (ref 4.0–10.5)

## 2010-09-25 LAB — TISSUE CULTURE
Culture: NO GROWTH
Gram Stain: NONE SEEN

## 2010-09-26 ENCOUNTER — Emergency Department (HOSPITAL_COMMUNITY): Payer: Medicare Other

## 2010-09-26 ENCOUNTER — Emergency Department (HOSPITAL_COMMUNITY)
Admission: EM | Admit: 2010-09-26 | Discharge: 2010-09-26 | Disposition: A | Discharge status: Home / Self Care | Payer: Medicare Other | Attending: Emergency Medicine | Admitting: Emergency Medicine

## 2010-09-26 DIAGNOSIS — I2510 Atherosclerotic heart disease of native coronary artery without angina pectoris: Secondary | ICD-10-CM | POA: Insufficient documentation

## 2010-09-26 DIAGNOSIS — G8929 Other chronic pain: Secondary | ICD-10-CM | POA: Insufficient documentation

## 2010-09-26 DIAGNOSIS — M79609 Pain in unspecified limb: Secondary | ICD-10-CM | POA: Insufficient documentation

## 2010-09-26 DIAGNOSIS — R29898 Other symptoms and signs involving the musculoskeletal system: Secondary | ICD-10-CM | POA: Insufficient documentation

## 2010-09-26 DIAGNOSIS — E785 Hyperlipidemia, unspecified: Secondary | ICD-10-CM | POA: Insufficient documentation

## 2010-09-26 DIAGNOSIS — R209 Unspecified disturbances of skin sensation: Secondary | ICD-10-CM | POA: Insufficient documentation

## 2010-09-26 DIAGNOSIS — M549 Dorsalgia, unspecified: Secondary | ICD-10-CM | POA: Insufficient documentation

## 2010-09-26 DIAGNOSIS — G8918 Other acute postprocedural pain: Secondary | ICD-10-CM | POA: Insufficient documentation

## 2010-09-26 DIAGNOSIS — Z79899 Other long term (current) drug therapy: Secondary | ICD-10-CM | POA: Insufficient documentation

## 2010-09-26 DIAGNOSIS — R350 Frequency of micturition: Secondary | ICD-10-CM | POA: Insufficient documentation

## 2010-09-26 LAB — CBC
HCT: 35.3 % — ABNORMAL LOW (ref 39.0–52.0)
Platelets: 343 10*3/uL (ref 150–400)
RBC: 3.95 MIL/uL — ABNORMAL LOW (ref 4.22–5.81)
RDW: 13.9 % (ref 11.5–15.5)
WBC: 15.9 10*3/uL — ABNORMAL HIGH (ref 4.0–10.5)

## 2010-09-26 LAB — ANAEROBIC CULTURE

## 2010-09-26 LAB — DIFFERENTIAL
Basophils Absolute: 0.2 10*3/uL — ABNORMAL HIGH (ref 0.0–0.1)
Basophils Relative: 1 % (ref 0–1)
Eosinophils Absolute: 0.2 10*3/uL (ref 0.0–0.7)
Lymphocytes Relative: 26 % (ref 12–46)
Monocytes Relative: 10 % (ref 3–12)
Neutro Abs: 9.8 10*3/uL — ABNORMAL HIGH (ref 1.7–7.7)
Neutrophils Relative %: 62 % (ref 43–77)

## 2010-09-26 LAB — URINALYSIS, ROUTINE W REFLEX MICROSCOPIC
Ketones, ur: NEGATIVE mg/dL
Leukocytes, UA: NEGATIVE
Nitrite: NEGATIVE
Protein, ur: NEGATIVE mg/dL
Urobilinogen, UA: 0.2 mg/dL (ref 0.0–1.0)

## 2010-09-26 LAB — APTT: aPTT: 29 seconds (ref 24–37)

## 2010-09-26 LAB — BASIC METABOLIC PANEL
Calcium: 9.3 mg/dL (ref 8.4–10.5)
Creatinine, Ser: 0.99 mg/dL (ref 0.50–1.35)
GFR calc Af Amer: >60 mL/min (ref >60)
Sodium: 142 mEq/L (ref 135–145)

## 2010-09-26 LAB — PROTIME-INR: INR: 0.92 (ref 0.00–1.49)

## 2010-09-26 MED ORDER — GADOBENATE DIMEGLUMINE 529 MG/ML IV SOLN
18.0000 mL | Freq: Once | INTRAVENOUS | Status: AC | PRN
  Administered 2010-09-26: 18 mL via INTRAVENOUS

## 2010-09-30 ENCOUNTER — Emergency Department (HOSPITAL_COMMUNITY)
Admission: EM | Admit: 2010-09-30 | Discharge: 2010-09-30 | Disposition: A | Discharge status: Home / Self Care | Payer: Medicare Other | Attending: Emergency Medicine | Admitting: Emergency Medicine

## 2010-09-30 ENCOUNTER — Emergency Department (HOSPITAL_COMMUNITY): Payer: Medicare Other

## 2010-09-30 DIAGNOSIS — M549 Dorsalgia, unspecified: Secondary | ICD-10-CM | POA: Insufficient documentation

## 2010-09-30 DIAGNOSIS — I2510 Atherosclerotic heart disease of native coronary artery without angina pectoris: Secondary | ICD-10-CM | POA: Insufficient documentation

## 2010-09-30 DIAGNOSIS — E785 Hyperlipidemia, unspecified: Secondary | ICD-10-CM | POA: Insufficient documentation

## 2010-09-30 DIAGNOSIS — M79609 Pain in unspecified limb: Secondary | ICD-10-CM | POA: Insufficient documentation

## 2010-09-30 DIAGNOSIS — Z79899 Other long term (current) drug therapy: Secondary | ICD-10-CM | POA: Insufficient documentation

## 2010-09-30 DIAGNOSIS — G8929 Other chronic pain: Secondary | ICD-10-CM | POA: Insufficient documentation

## 2010-09-30 LAB — BASIC METABOLIC PANEL
Chloride: 103 mEq/L (ref 96–112)
Creatinine, Ser: 1.12 mg/dL (ref 0.50–1.35)
GFR calc Af Amer: >60 mL/min (ref >60)
Potassium: 3.8 mEq/L (ref 3.5–5.1)
Sodium: 141 mEq/L (ref 135–145)

## 2010-09-30 LAB — CBC
MCV: 93 fL (ref 78.0–100.0)
Platelets: 333 10*3/uL (ref 150–400)
RDW: 14.7 % (ref 11.5–15.5)
WBC: 13.4 10*3/uL — ABNORMAL HIGH (ref 4.0–10.5)

## 2010-09-30 LAB — DIFFERENTIAL
Basophils Absolute: 0.1 10*3/uL (ref 0.0–0.1)
Basophils Relative: 1 % (ref 0–1)
Eosinophils Absolute: 0.1 10*3/uL (ref 0.0–0.7)
Eosinophils Relative: 1 % (ref 0–5)

## 2010-09-30 LAB — URINALYSIS, ROUTINE W REFLEX MICROSCOPIC
Ketones, ur: NEGATIVE mg/dL
Leukocytes, UA: NEGATIVE
Nitrite: NEGATIVE
Protein, ur: NEGATIVE mg/dL
pH: 6.5 (ref 5.0–8.0)

## 2010-10-03 NOTE — Op Note (Signed)
  NAMECALOGERO, GEISEN NO.:  [id number]  MEDICAL RECORD NO.:  [id number]  LOCATION:  3526                         FACILITY:  [facility]  PHYSICIAN:  Hilda Lias, M.D.   DATE OF BIRTH:  January 13, 1961  DATE OF PROCEDURE:  09/21/2010 DATE OF DISCHARGE:                              OPERATIVE REPORT   PREOPERATIVE DIAGNOSES:  L4-5 herniated disk with bilateral radiculopathy, status post laminotomy and foraminotomy L3-4 and L4-5 __hnp________ right side.  POSTOPERATIVE DIAGNOSES:  L4-5 herniated disk with bilateral radiculopathy, status post laminotomy and foraminotomy L3-4 and L4-5 ____hnp______ right side.  PROCEDURE:  Bilateral L4-5 laminotomy, bilateral foraminotomy, left L4-5 diskectomy.  Specimen sent to the lab for culture and pathology. Microscope.  SURGEON:  Hilda Lias, MD  ASSISTANT:  Hewitt Shorts, MD  CLINICAL HISTORY:  Mr. Mercadel is a 50 year old gentleman who in the past underwent bilateral L3-4 and L4-5 laminotomy.  Now, he had been complaining of back pain with radiation to both legs for several months. He has failed with conservative treatment.  When he came for a workup, he denied any fever, but we found his white cell increasing to 26,000. Surgery was advised.  PROCEDURE:  The patient was taken to the OR and after intubation, he was positioned in the prone manner.  The back was cleaned with DuraPrep.  A midline incision from L4-5 was made and muscle retracted laterally.  X- rays showed that indeed we were at L4-5.  We brought the microscope and we drilled the lower lamina of L4 and L5 until we were able to find yellow ligament.  The yellow ligament was removed.  The patient had quite a bit of adhesion on the right side.  Lysis was accomplished. Foraminotomy was done.  We investigated the L4-5, disk was slightly bulging and we decided not to do any diskectomy in this area.  We went to the left side and we did the same laminotomy and  foraminotomy with decompression of the L4 and L5 nerve root.  Upon retraction of the thecal sac, we found that disk was more mostly bulging with a free area and a small opening.  An incision was made and total diskectomy was accomplished from the left side.  Nevertheless, the disk _had edema_________ and __________  specimen was sent to the laboratory.  Having total diskectomy, the area was irrigated.  Valsalva maneuver was negative. Fentanyl and Depo-Medrol were left in the epidural space bilaterally and the wound was closed with Vicryl and Steri-Strips.          ______________________________ Hilda Lias, M.D.     EB/MEDQ  D:  09/21/2010  T:  09/22/2010  Job:  [id number]  Electronically Signed by Hilda Lias M.D. on 10/03/2010 01:47:19 PM

## 2010-10-03 NOTE — H&P (Signed)
  NAMEDORTHY, HUSTEAD NO.:  [id number]  MEDICAL RECORD NO.:  [id number]  LOCATION:  XRAY                         FACILITY:  [facility]  PHYSICIAN:  Hilda Lias, M.D.   DATE OF BIRTH:  1961/02/03  DATE OF ADMISSION:  09/21/2010 DATE OF DISCHARGE:  09/21/2010                             HISTORY & PHYSICAL   [name] is a gentleman who had been seen in my office for more than 2- 1/2 years complaining of back pain radiating to both legs.  Lately, he tells me that he is quite miserable.  He has difficulty walking, walking is quite painful.  He has to sit several times because the pain is unbearable.  The pain is associated with a burning sensation.  The patient had epidural injection and he did not improve.  Physical therapy did not help.  PAST MEDICAL HISTORY:  He never had surgery.  He is allergic to ASPIRIN.  FAMILY HISTORY:  Positive for high blood pressure and diabetes.  SOCIAL HISTORY:  He does not drink.  He smokes half a pack a day.  REVIEW OF SYSTEMS:  Positive for decrease of hearing, sinus headache, liver disease, lung cancer, leg weakness, inability to concentrate, __________, anxiety, depression, back pain, leg pain.  PHYSICAL EXAMINATION:  GENERAL:  The patient came to my office and he was brought in with shortness of breath.  He has difficulty sitting and standing. HEAD, EARS, EYES, NOSE, AND THROAT:  Normal. NECK:  Normal. LUNGS:  There are some mild rhonchi bilaterally. CARDIOVASCULAR:  Normal. ABDOMEN:  Normal. EXTREMITIES:  Normal.  There is a grade 1 edema. NEUROLOGIC:  He has some with dorsiflexion of both legs with straight leg raising, SLR is positive about 60 degrees.  The x-ray, he has had some degenerative changes at the level of L1-L2 and L2-L3, but at the level of L4-L5, he has had a stenosis with bilateral foraminal narrowing.  CLINICAL IMPRESSION:  L4-L5 herniated disk with a stenosis bilaterally, L5  radiculopathy.  RECOMMENDATIONS:  The patient is being admitted for surgery.  Procedure will be bilateral foraminotomy and laminotomy and most likely diskectomy.  He knew the risks with the surgery such as infection, CSF leak, worsening pain, paralysis, need for surgery.  He is fully aware. During surgery, we may find difficulty making space at the thecal sac, we would proceed with bilateral L4-L5 laminectomy.          ______________________________ Hilda Lias, M.D.     EB/MEDQ  D:  09/21/2010  T:  09/22/2010  Job:  [id number]  Electronically Signed by Hilda Lias M.D. on 10/03/2010 01:44:59 PM

## 2010-10-04 ENCOUNTER — Encounter: Payer: Medicare Other | Attending: Neurosurgery | Admitting: Neurosurgery

## 2010-10-12 ENCOUNTER — Emergency Department (HOSPITAL_COMMUNITY)
Admission: EM | Admit: 2010-10-12 | Discharge: 2010-10-12 | Disposition: A | Discharge status: Home / Self Care | Payer: Medicare Other | Attending: Emergency Medicine | Admitting: Emergency Medicine

## 2010-10-12 DIAGNOSIS — Z9889 Other specified postprocedural states: Secondary | ICD-10-CM | POA: Insufficient documentation

## 2010-10-12 DIAGNOSIS — Z79899 Other long term (current) drug therapy: Secondary | ICD-10-CM | POA: Insufficient documentation

## 2010-10-12 DIAGNOSIS — E785 Hyperlipidemia, unspecified: Secondary | ICD-10-CM | POA: Insufficient documentation

## 2010-10-12 DIAGNOSIS — M545 Low back pain, unspecified: Secondary | ICD-10-CM | POA: Insufficient documentation

## 2010-10-12 DIAGNOSIS — I2510 Atherosclerotic heart disease of native coronary artery without angina pectoris: Secondary | ICD-10-CM | POA: Insufficient documentation

## 2010-10-12 DIAGNOSIS — R209 Unspecified disturbances of skin sensation: Secondary | ICD-10-CM | POA: Insufficient documentation

## 2010-10-13 NOTE — Discharge Summary (Signed)
  NAMEAVIN, Michael [name] NO.:  [id number]  MEDICAL RECORD NO.:  [id number]  LOCATION:  3526                         FACILITY:  [facility]  PHYSICIAN:  Hilda Lias, M.D.   DATE OF BIRTH:  1960/12/15  DATE OF ADMISSION:  09/21/2010 DATE OF DISCHARGE:  09/22/2010                              DISCHARGE SUMMARY   ADMISSION DIAGNOSIS:  L4-L5 stenosis with left L4-5 herniated disk.  FINAL DIAGNOSIS:  L4-L5 stenosis with left L4-5 herniated disk.  CLINICAL HISTORY:  The patient was admitted because of back pain worsened to both legs.  X-ray showed that he has stenosis at the L4-5 with a left L4-5 herniated disk.  Surgery was advised.  Laboratory normal except for increase in leukocytes.  COURSE IN THE HOSPITAL:  The patient was taken to surgery and bilateral L4-5 foraminotomy and left L4-5 diskectomy was done.  The patient did well.  We did a consultation with Infectious Disease because of increase of leukocytosis.  The patient was seen by Dr. Ninetta Lights from Infectious Disease.  All the tests came negative and the patient was advised he can be discharged.  He has a history of sarcoidosis with liver involvement. The patient was discharge on June 14.  CONDITION ON DISCHARGE:  Improvement.  MEDICATION:  Oxycodone for pain, diazepam.  DIET:  Regular.  ACTIVITY:  Not to drive, not to do any heavy lifting.  FOLLOWUP:  He will be seen in my office in 5 weeks.          ______________________________ Hilda Lias, M.D.     EB/MEDQ  D:  10/07/2010  T:  10/08/2010  Job:  [id number]  Electronically Signed by Hilda Lias M.D. on 10/13/2010 01:31:58 PM

## 2010-11-02 ENCOUNTER — Encounter: Payer: Medicare Other | Admitting: Physical Medicine and Rehabilitation

## 2010-12-15 ENCOUNTER — Other Ambulatory Visit: Payer: Self-pay | Admitting: Neurosurgery

## 2010-12-15 DIAGNOSIS — M549 Dorsalgia, unspecified: Secondary | ICD-10-CM

## 2010-12-19 ENCOUNTER — Ambulatory Visit
Admission: RE | Admit: 2010-12-19 | Discharge: 2010-12-19 | Disposition: A | Discharge status: Home / Self Care | Payer: Medicare Other | Source: Ambulatory Visit | Attending: Neurosurgery | Admitting: Neurosurgery

## 2010-12-19 ENCOUNTER — Other Ambulatory Visit: Payer: Self-pay | Admitting: Neurosurgery

## 2010-12-19 DIAGNOSIS — M549 Dorsalgia, unspecified: Secondary | ICD-10-CM

## 2010-12-19 DIAGNOSIS — M545 Low back pain, unspecified: Secondary | ICD-10-CM

## 2010-12-19 MED ORDER — SODIUM CHLORIDE 0.9 % IV SOLN
Freq: Once | INTRAVENOUS | Status: AC
  Administered 2010-12-19: 08:00:00 via INTRAVENOUS

## 2010-12-19 MED ORDER — MIDAZOLAM HCL 2 MG/2ML IJ SOLN: 1.0000 mg | INTRAMUSCULAR | Status: DC | PRN

## 2010-12-19 MED ORDER — FENTANYL CITRATE 0.05 MG/ML IJ SOLN
25.0000 ug | INTRAMUSCULAR | Status: DC | PRN
  Administered 2010-12-19: 50 ug via INTRAVENOUS

## 2010-12-19 MED ORDER — KETOROLAC TROMETHAMINE 30 MG/ML IJ SOLN
30.0000 mg | Freq: Once | INTRAMUSCULAR | Status: AC
  Administered 2010-12-19: 30 mg via INTRAVENOUS

## 2010-12-19 MED ORDER — FENTANYL CITRATE 0.05 MG/ML IJ SOLN: 25.0000 ug | INTRAMUSCULAR | Status: DC | PRN

## 2010-12-19 MED ORDER — MIDAZOLAM HCL 2 MG/2ML IJ SOLN
1.0000 mg | INTRAMUSCULAR | Status: DC | PRN
  Administered 2010-12-19: 1 mg via INTRAVENOUS

## 2010-12-19 NOTE — Progress Notes (Signed)
Pt c/o of back pain and numbness in both legs since surgery 3 months ago.  Denies fentanyl allergy. History reviewed and is unchanged per pt.

## 2010-12-22 LAB — BODY FLUID CULTURE
Gram Stain: NONE SEEN
Organism ID, Bacteria: NO GROWTH

## 2011-12-22 ENCOUNTER — Emergency Department (HOSPITAL_COMMUNITY): Payer: Medicare Other

## 2011-12-22 ENCOUNTER — Emergency Department (HOSPITAL_COMMUNITY)
Admission: EM | Admit: 2011-12-22 | Discharge: 2011-12-23 | Disposition: A | Discharge status: Home / Self Care | Payer: Medicare Other | Attending: Emergency Medicine | Admitting: Emergency Medicine

## 2011-12-22 ENCOUNTER — Encounter (HOSPITAL_COMMUNITY): Payer: Self-pay | Admitting: *Deleted

## 2011-12-22 DIAGNOSIS — R109 Unspecified abdominal pain: Secondary | ICD-10-CM

## 2011-12-22 DIAGNOSIS — F411 Generalized anxiety disorder: Secondary | ICD-10-CM | POA: Insufficient documentation

## 2011-12-22 DIAGNOSIS — R059 Cough, unspecified: Secondary | ICD-10-CM

## 2011-12-22 DIAGNOSIS — J069 Acute upper respiratory infection, unspecified: Secondary | ICD-10-CM | POA: Insufficient documentation

## 2011-12-22 DIAGNOSIS — F17200 Nicotine dependence, unspecified, uncomplicated: Secondary | ICD-10-CM | POA: Insufficient documentation

## 2011-12-22 DIAGNOSIS — M129 Arthropathy, unspecified: Secondary | ICD-10-CM | POA: Insufficient documentation

## 2011-12-22 DIAGNOSIS — G8929 Other chronic pain: Secondary | ICD-10-CM | POA: Insufficient documentation

## 2011-12-22 DIAGNOSIS — K219 Gastro-esophageal reflux disease without esophagitis: Secondary | ICD-10-CM | POA: Insufficient documentation

## 2011-12-22 DIAGNOSIS — F3289 Other specified depressive episodes: Secondary | ICD-10-CM | POA: Insufficient documentation

## 2011-12-22 DIAGNOSIS — D869 Sarcoidosis, unspecified: Secondary | ICD-10-CM | POA: Insufficient documentation

## 2011-12-22 DIAGNOSIS — F329 Major depressive disorder, single episode, unspecified: Secondary | ICD-10-CM | POA: Insufficient documentation

## 2011-12-22 DIAGNOSIS — R05 Cough: Secondary | ICD-10-CM

## 2011-12-22 MED ORDER — PREDNISONE 20 MG PO TABS
60.0000 mg | ORAL_TABLET | Freq: Once | ORAL | Status: AC
  Administered 2011-12-22: 60 mg via ORAL
  Filled 2011-12-22: qty 3

## 2011-12-22 MED ORDER — GUAIFENESIN-CODEINE 100-10 MG/5ML PO SOLN
5.0000 mL | Freq: Once | ORAL | Status: AC
  Administered 2011-12-22: 5 mL via ORAL
  Filled 2011-12-22: qty 5

## 2011-12-22 MED ORDER — ALBUTEROL SULFATE (5 MG/ML) 0.5% IN NEBU
2.5000 mg | INHALATION_SOLUTION | Freq: Once | RESPIRATORY_TRACT | Status: AC
  Administered 2011-12-22: 2.5 mg via RESPIRATORY_TRACT
  Filled 2011-12-22: qty 0.5

## 2011-12-22 MED ORDER — IPRATROPIUM BROMIDE 0.02 % IN SOLN
0.5000 mg | Freq: Once | RESPIRATORY_TRACT | Status: AC
  Administered 2011-12-22: 0.5 mg via RESPIRATORY_TRACT
  Filled 2011-12-22: qty 2.5

## 2011-12-22 NOTE — ED Notes (Signed)
Pt presents to ED secondary to abdominal pain x 1 month that has significantly worsened today. Pt refers to pain as cramping in mid abdomin.  Denies N/V, diarrhea and fever at this time.  Pt reports being seen by PCP and rx'd protonix, which has not helped. Pt also states has a great deal of stress. Loud, congested cough audible. productive Cough present x 3 weeks per pt. Pt states has taken breathing treatment QD without relief. No respiratory distress noted at this time

## 2011-12-22 NOTE — ED Provider Notes (Signed)
History     CSN: [id number]  Arrival date & time 12/22/11  2032   First MD Initiated Contact with Patient 12/22/11 2259      Chief Complaint  Patient presents with  . Abdominal Pain    (Consider location/radiation/quality/duration/timing/severity/associated sxs/prior treatment) HPI  Michael [name] is a 51 y.o. male who presents to the Emergency Department complaining of abdominal pain associated with a cough he has had for two weeks. He is currently under treatment with antibiotics, prednisone and an inhaler. The cough comes in waves and the more he coughs, the more abdominal pain he experiences. He is having nausea and  post tussive vomiting. Denies fever, chills, shortness of breath.  PCP Dr. Aleen Campi  Past Medical History  Diagnosis Date  . Depression with anxiety   . GERD (gastroesophageal reflux disease)   . Chronic low back pain     follwed in Pain Clinic(Dr.Kirchmayer)  . Arthritis   . Migraines   . Sarcoidosis     Multisystem,pulmonary and hepatic (Dr.Rourk)Liver bx in 06 mildly postive AMA but on re-check normal  . Allergic rhinitis   . Insomnia     Past Surgical History  Procedure Date  . Laminectomy 2/10    Balateral L4-L5  . Back surgery     2nd back surgery 3 monthes ago and in 2007    History reviewed. No pertinent family history.  History  Substance Use Topics  . Smoking status: Current Some Day Smoker  . Smokeless tobacco: Not on file  . Alcohol Use: Yes      Review of Systems  Constitutional: Negative for fever.       10 Systems reviewed and are negative for acute change except as noted in the HPI.  HENT: Negative for congestion.   Eyes: Negative for discharge and redness.  Respiratory: Positive for cough. Negative for shortness of breath.   Cardiovascular: Negative for chest pain.  Gastrointestinal: Positive for nausea, vomiting and abdominal pain.  Musculoskeletal: Negative for back pain.  Skin: Negative for rash.  Neurological:  Negative for syncope, numbness and headaches.  Psychiatric/Behavioral:       No behavior change.    Allergies  Aspirin and Methadone  Home Medications   Current Outpatient Rx  Name Route Sig Dispense Refill  . ALBUTEROL SULFATE (2.5 MG/3ML) 0.083% IN NEBU Nebulization Take 2.5 mg by nebulization every 6 (six) hours as needed.      . ARIPIPRAZOLE 10 MG PO TABS Oral Take 10 mg by mouth daily.      Marland Kitchen BENZONATATE 100 MG PO CAPS Oral Take 100 mg by mouth 3 (three) times daily as needed. cough    . DIAZEPAM 10 MG PO TABS Oral Take 10 mg by mouth every 6 (six) hours as needed. anxiety    . FLUTICASONE PROPIONATE 50 MCG/ACT NA SUSP Nasal Place 2 sprays into the nose daily.     . OLOPATADINE HCL 0.1 % OP SOLN  1 drop 2 (two) times daily.      . OXYCODONE HCL 10 MG PO TABS Oral Take 10 mg by mouth every 4 (four) hours as needed. pain    . PRAVASTATIN SODIUM 10 MG PO TABS Oral Take 10 mg by mouth daily.      Marland Kitchen PREGABALIN 50 MG PO CAPS Oral Take 50 mg by mouth 3 (three) times daily.        BP 138/117  Pulse 90  Temp 98.5 F (36.9 C) (Oral)  Resp 18  Ht 6'  2" (1.88 m)  Wt 200 lb (90.719 kg)  BMI 25.68 kg/m2  SpO2 100%  Physical Exam  Nursing note and vitals reviewed. Constitutional: He is oriented to person, place, and time. He appears well-developed and well-nourished.       Awake, alert, nontoxic appearance.  HENT:  Head: Atraumatic.  Eyes: Right eye exhibits no discharge. Left eye exhibits no discharge.  Neck: Neck supple.  Pulmonary/Chest: Effort normal. He exhibits no tenderness.       Coughing. Occasional end expiratory wheeze  Abdominal: Soft. There is no tenderness. There is no rebound and no guarding.  Musculoskeletal: He exhibits no tenderness.       Baseline ROM, no obvious new focal weakness.  Neurological: He is alert and oriented to person, place, and time.       Mental status and motor strength appears baseline for patient and situation.  Skin: No rash noted.    Psychiatric: He has a normal mood and affect.    ED Course  Procedures (including critical care time)  Dg Abd Acute W/chest  12/22/2011  *RADIOLOGY REPORT*  Clinical Data: Cough and abdominal pain with vomiting.  ACUTE ABDOMEN SERIES (ABDOMEN 2 VIEW & CHEST 1 VIEW)  Comparison: 09/21/2010 chest radiograph and 06/27/2010 lumbar spine series.  Findings: The cardiomediastinal silhouette is unremarkable. The lungs are clear except for mild chronic peribronchial thickening. There is no evidence of airspace disease, pleural effusion or pneumothorax.  The bowel gas pattern is unremarkable. No dilated bowel loops are present. There is no evidence of bowel obstruction or pneumoperitoneum. No suspicious calcifications are identified. No bony abnormalities are noted.  IMPRESSION: No evidence of acute abnormality.  Unremarkable bowel gas pattern.   Original Report Authenticated By: Rosendo Gros, M.D.    Lux.Amos Patient states he is feeling better. Breathing easier, Cough improved.  MDM  Patient with persistent cough in the setting of a recent URI. Finishing treatment with antibiotics, prednisone and inhaler. Given nebulizer treatment, prednisone, cough suppressant with improvement. Pt feels improved after observation and/or treatment in ED.Pt stable in ED with no significant deterioration in condition.The patient appears reasonably screened and/or stabilized for discharge and I doubt any other medical condition or other [facility] requiring further screening, evaluation, or treatment in the ED at this time prior to discharge.  MDM Reviewed: nursing note and vitals Interpretation: x-ray            Nicoletta Dress. Colon Branch, MD 12/23/11 [phone]

## 2011-12-22 NOTE — ED Notes (Signed)
abd pain, today,  Cough for 2-3 weeks.   Vomiting,no diarrhea

## 2011-12-23 MED ORDER — GUAIFENESIN-CODEINE 100-10 MG/5ML PO SYRP: 5.0000 mL | ORAL_SOLUTION | Freq: Three times a day (TID) | ORAL | Status: AC | PRN

## 2011-12-23 MED ORDER — ONDANSETRON HCL 4 MG PO TABS: 4.0000 mg | ORAL_TABLET | Freq: Four times a day (QID) | ORAL | Status: AC

## 2012-01-03 ENCOUNTER — Other Ambulatory Visit: Payer: Self-pay | Admitting: Anesthesiology

## 2012-01-03 DIAGNOSIS — M545 Low back pain, unspecified: Secondary | ICD-10-CM

## 2012-01-06 ENCOUNTER — Ambulatory Visit
Admission: RE | Admit: 2012-01-06 | Discharge: 2012-01-06 | Disposition: A | Discharge status: Home / Self Care | Payer: Medicare Other | Source: Ambulatory Visit | Attending: Anesthesiology | Admitting: Anesthesiology

## 2012-01-06 DIAGNOSIS — M545 Low back pain, unspecified: Secondary | ICD-10-CM

## 2012-10-18 DIAGNOSIS — R0602 Shortness of breath: Secondary | ICD-10-CM

## 2013-10-16 ENCOUNTER — Telehealth: Payer: Self-pay

## 2013-10-16 NOTE — Telephone Encounter (Signed)
Pt LMOM that he needed to set up tcs. He received a letter to be triaged. [phone]

## 2013-10-21 NOTE — Telephone Encounter (Signed)
I called pt and reviewed meds. OV due to meds on 12/02/2013 at 10:30 AM with Laban Emperor, NP/

## 2013-12-02 ENCOUNTER — Encounter: Payer: Self-pay | Admitting: Gastroenterology

## 2013-12-02 ENCOUNTER — Ambulatory Visit: Payer: Medicare Other | Admitting: Gastroenterology

## 2013-12-02 ENCOUNTER — Telehealth: Payer: Self-pay | Admitting: Gastroenterology

## 2013-12-02 NOTE — Telephone Encounter (Signed)
MAILED LETTER °

## 2013-12-02 NOTE — Telephone Encounter (Signed)
Pt was a no show

## 2013-12-10 ENCOUNTER — Encounter (HOSPITAL_COMMUNITY): Payer: Self-pay | Admitting: Emergency Medicine

## 2013-12-10 ENCOUNTER — Emergency Department (HOSPITAL_COMMUNITY)
Admission: EM | Admit: 2013-12-10 | Discharge: 2013-12-10 | Disposition: A | Discharge status: Home / Self Care | Payer: Medicare Other | Attending: Emergency Medicine | Admitting: Emergency Medicine

## 2013-12-10 DIAGNOSIS — Y9389 Activity, other specified: Secondary | ICD-10-CM | POA: Diagnosis not present

## 2013-12-10 DIAGNOSIS — X500XXA Overexertion from strenuous movement or load, initial encounter: Secondary | ICD-10-CM | POA: Insufficient documentation

## 2013-12-10 DIAGNOSIS — Z8619 Personal history of other infectious and parasitic diseases: Secondary | ICD-10-CM | POA: Diagnosis not present

## 2013-12-10 DIAGNOSIS — F341 Dysthymic disorder: Secondary | ICD-10-CM | POA: Insufficient documentation

## 2013-12-10 DIAGNOSIS — Z79899 Other long term (current) drug therapy: Secondary | ICD-10-CM | POA: Insufficient documentation

## 2013-12-10 DIAGNOSIS — G8929 Other chronic pain: Secondary | ICD-10-CM | POA: Diagnosis not present

## 2013-12-10 DIAGNOSIS — Z8709 Personal history of other diseases of the respiratory system: Secondary | ICD-10-CM | POA: Insufficient documentation

## 2013-12-10 DIAGNOSIS — G43909 Migraine, unspecified, not intractable, without status migrainosus: Secondary | ICD-10-CM | POA: Diagnosis not present

## 2013-12-10 DIAGNOSIS — Y929 Unspecified place or not applicable: Secondary | ICD-10-CM | POA: Insufficient documentation

## 2013-12-10 DIAGNOSIS — F17200 Nicotine dependence, unspecified, uncomplicated: Secondary | ICD-10-CM | POA: Diagnosis not present

## 2013-12-10 DIAGNOSIS — Z8719 Personal history of other diseases of the digestive system: Secondary | ICD-10-CM | POA: Insufficient documentation

## 2013-12-10 DIAGNOSIS — IMO0002 Reserved for concepts with insufficient information to code with codable children: Secondary | ICD-10-CM | POA: Diagnosis not present

## 2013-12-10 DIAGNOSIS — M545 Low back pain, unspecified: Secondary | ICD-10-CM

## 2013-12-10 DIAGNOSIS — Z8739 Personal history of other diseases of the musculoskeletal system and connective tissue: Secondary | ICD-10-CM | POA: Insufficient documentation

## 2013-12-10 DIAGNOSIS — Z9889 Other specified postprocedural states: Secondary | ICD-10-CM | POA: Insufficient documentation

## 2013-12-10 NOTE — Discharge Instructions (Signed)
Please see the members of the pain management team at [facility] spine surgery on the 17th as scheduled. Please notify them if any changes before that time. The physician assistant at the [place] spine surgery suggested you continue your current medications as prescribed by them. Please let them know if you have any problems. Chronic Back Pain  When back pain lasts longer than 3 months, it is called chronic back pain.People with chronic back pain often go through certain periods that are more intense (flare-ups).  CAUSES Chronic back pain can be caused by wear and tear (degeneration) on different structures in your back. These structures include:  The bones of your spine (vertebrae) and the joints surrounding your spinal cord and nerve roots (facets).  The strong, fibrous tissues that connect your vertebrae (ligaments). Degeneration of these structures may result in pressure on your nerves. This can lead to constant pain. HOME CARE INSTRUCTIONS  Avoid bending, heavy lifting, prolonged sitting, and activities which make the problem worse.  Take brief periods of rest throughout the day to reduce your pain. Lying down or standing usually is better than sitting while you are resting.  Take over-the-counter or prescription medicines only as directed by your caregiver. SEEK IMMEDIATE MEDICAL CARE IF:   You have weakness or numbness in one of your legs or feet.  You have trouble controlling your bladder or bowels.  You have nausea, vomiting, abdominal pain, shortness of breath, or fainting. Document Released: 05/04/2004 Document Revised: 06/19/2011 Document Reviewed: 03/11/2011 [facility] Patient Information 2015 [place], [place]. This information is not intended to replace advice given to you by your health care provider. Make sure you discuss any questions you have with your health care provider.

## 2013-12-10 NOTE — ED Notes (Signed)
nad noted prior to dc. Pt voiced understanding with f/u

## 2013-12-10 NOTE — ED Provider Notes (Signed)
CSN: [id number]     Arrival date & time 12/10/13  1040 History   First MD Initiated Contact with Patient 12/10/13 1139     Chief Complaint  Patient presents with  . Back Pain     (Consider location/radiation/quality/duration/timing/severity/associated sxs/prior Treatment) HPI Comments: Patient is a 53 year old male who presents to the emergency department with complaint of back pain. The patient states that he has a history of chronic back pain, he has had back surgeries in the past. He is currently seen by a pain management physician in connection with L. spine surgery. The patient states that on yesterday he was bending over to pick up something when he felt a" pop" of his lower back near the surgery site. He states he's had pain in his back since that time. He did not have any loss of bowel or bladder function. There's been no temperature changes to be reported. Patient is not had any falls since that time, however states he is mostly been in bed since the incident.  Patient is a 53 y.o. male presenting with back pain. The history is provided by the patient.  Back Pain   Past Medical History  Diagnosis Date  . Depression with anxiety   . GERD (gastroesophageal reflux disease)   . Chronic low back pain     follwed in Pain Clinic(Dr.Kirchmayer)  . Arthritis   . Migraines   . Sarcoidosis     Multisystem,pulmonary and hepatic (Dr.Rourk)Liver bx in 06 mildly postive AMA but on re-check normal  . Allergic rhinitis   . Insomnia    Past Surgical History  Procedure Laterality Date  . Laminectomy  2/10    Balateral L4-L5  . Back surgery      2nd back surgery 3 monthes ago and in 2007   No family history on file. History  Substance Use Topics  . Smoking status: Current Some Day Smoker    Types: Cigarettes  . Smokeless tobacco: Not on file  . Alcohol Use: No    Review of Systems  Musculoskeletal: Positive for back pain.      Allergies  Aspirin and Methadone  Home  Medications   Prior to Admission medications   Medication Sig Start Date End Date Taking? Authorizing Provider  albuterol (PROVENTIL) (2.5 MG/3ML) 0.083% nebulizer solution Take 2.5 mg by nebulization every 6 (six) hours as needed for wheezing or shortness of breath.    Yes Historical Provider, MD  ALPRAZolam (XANAX) 0.25 MG tablet Take 0.25 mg by mouth at bedtime. One tablet daily as needed   Yes Historical Provider, MD  ARIPiprazole (ABILIFY) 10 MG tablet Take 10 mg by mouth daily.     Yes Historical Provider, MD  atorvastatin (LIPITOR) 20 MG tablet Take 20 mg by mouth at bedtime. 12/04/13  Yes Historical Provider, MD  fluticasone (FLONASE) 50 MCG/ACT nasal spray Place 2 sprays into the nose daily.    Yes Historical Provider, MD  olopatadine (PATANOL) 0.1 % ophthalmic solution Place 1 drop into both eyes daily.    Yes Historical Provider, MD  Oxycodone HCl 10 MG TABS Take 10 mg by mouth every 4 (four) hours as needed. pain   Yes Historical Provider, MD  pravastatin (PRAVACHOL) 10 MG tablet Take 10 mg by mouth daily.     Yes Historical Provider, MD  pregabalin (LYRICA) 150 MG capsule Take 150 mg by mouth 2 (two) times daily.   Yes Historical Provider, MD  zolpidem (AMBIEN) 10 MG tablet Take 10 mg by mouth  at bedtime as needed for sleep.   Yes Historical Provider, MD   BP 131/96  Pulse 66  Temp(Src) 98.2 F (36.8 C) (Oral)  Resp 16  Ht 6\' 2"  (1.88 m)  Wt 200 lb (90.719 kg)  BMI 25.67 kg/m2  SpO2 100% Physical Exam  Nursing note and vitals reviewed. Constitutional: He is oriented to person, place, and time. He appears well-developed and well-nourished.  Non-toxic appearance.  HENT:  Head: Normocephalic.  Right Ear: Tympanic membrane and external ear normal.  Left Ear: Tympanic membrane and external ear normal.  Eyes: EOM and lids are normal. Pupils are equal, round, and reactive to light.  Neck: Normal range of motion. Neck supple. Carotid bruit is not present.  Cardiovascular: Normal  rate, regular rhythm, normal heart sounds, intact distal pulses and normal pulses.   Pulmonary/Chest: Breath sounds normal. No respiratory distress.  Abdominal: Soft. Bowel sounds are normal. There is no tenderness. There is no guarding.  Musculoskeletal:       Lumbar back: He exhibits decreased range of motion, pain and spasm.  Lymphadenopathy:       Head (right side): No submandibular adenopathy present.       Head (left side): No submandibular adenopathy present.    He has no cervical adenopathy.  Neurological: He is alert and oriented to person, place, and time. He has normal strength. No cranial nerve deficit or sensory deficit. Coordination normal.  He the lower extremities. No motor or sensory deficits appreciated.  Skin: Skin is warm and dry.  Psychiatric: He has a normal mood and affect. His speech is normal.    ED Course  Procedures (including critical care time) Labs Review Labs Reviewed - No data to display  Imaging Review No results found.   EKG Interpretation None      MDM Vital signs are within normal limits. No gross neurologic deficits appreciated. Review of previous office records and emergency department records reveal the patient mostly at his baseline. Pt discussed with [facility]. One of the members of his treatment team reviewed the findings on the examination in chart, as well as discuss the case with me. The patient has an appointment on the 17th of this month. He was recently prescribed a month's supply of oxycodone and Lyrica. They suggested he see them if he's having any problems before the 17th. They did not endorse any other evaluation or treatment.    Final diagnoses:  None    **I have reviewed nursing notes, vital signs, and all appropriate lab and imaging results for this patient.Lenox Ahr, PA-C 12/11/13 1044

## 2013-12-10 NOTE — ED Notes (Signed)
Lower back pain since yesterday after bending over and feeling a "pop".

## 2013-12-11 NOTE — ED Provider Notes (Signed)
Medical screening examination/treatment/procedure(s) were performed by non-physician practitioner and as supervising physician I was immediately available for consultation/collaboration.   EKG Interpretation None        Maudry Diego, MD 12/11/13 [phone]

## 2014-01-07 ENCOUNTER — Ambulatory Visit: Payer: Medicare Other | Admitting: Gastroenterology

## 2014-01-12 ENCOUNTER — Ambulatory Visit: Payer: Medicare Other | Admitting: Gastroenterology

## 2014-01-20 ENCOUNTER — Ambulatory Visit (INDEPENDENT_AMBULATORY_CARE_PROVIDER_SITE_OTHER): Payer: Medicare Other | Admitting: Gastroenterology

## 2014-01-20 ENCOUNTER — Encounter (INDEPENDENT_AMBULATORY_CARE_PROVIDER_SITE_OTHER): Payer: Self-pay

## 2014-01-20 ENCOUNTER — Encounter: Payer: Self-pay | Admitting: Gastroenterology

## 2014-01-20 VITALS — BP 120/76 | HR 79 | Temp 97.6°F | Ht 74.0 in | Wt 202.0 lb

## 2014-01-20 DIAGNOSIS — R197 Diarrhea, unspecified: Secondary | ICD-10-CM

## 2014-01-28 NOTE — Progress Notes (Signed)
Patient cancelled ov

## 2014-04-08 ENCOUNTER — Encounter: Payer: Self-pay | Admitting: Internal Medicine

## 2014-04-16 ENCOUNTER — Telehealth: Payer: Self-pay | Admitting: Internal Medicine

## 2014-04-16 NOTE — Telephone Encounter (Signed)
Pt has not been seen in the office since 2009. He was triaged for a tcs and it was decided he needed to come in for an ov. Pt cancelled the office visit.Marland Kitchen Routing to RMR for [employer]

## 2014-04-16 NOTE — Telephone Encounter (Signed)
Patient received letter and called asking what it was about. I seen where he had cancelled a few OV and where DS had triaged him back in July.  I told him that the letter was to set him up for a FU visit since he had missed a few appointments with [place]. He said that he was going to have knee surgery and he would call us when he is ready.

## 2014-04-16 NOTE — Telephone Encounter (Signed)
Noted  

## 2014-05-13 ENCOUNTER — Telehealth: Payer: Self-pay | Admitting: Internal Medicine

## 2014-05-13 NOTE — Telephone Encounter (Signed)
Noted thanks °

## 2014-05-13 NOTE — Telephone Encounter (Signed)
why

## 2014-05-13 NOTE — Telephone Encounter (Signed)
noted 

## 2014-05-13 NOTE — Telephone Encounter (Signed)
Patient wanted to let us know that he is having surgery on his leg on 05/26/2014 and it'll be a 4-6 week recovery time for him. I told him that I would let providers know.

## 2014-05-13 NOTE — Telephone Encounter (Signed)
Pt was triaged for a tcs and it was decided he needed to come in for an OV first. Pt has cancelled his last 3 office visits and this is the second month he has said that he was having leg surgery (see January phone note).

## 2015-04-11 HISTORY — PX: HIP SURGERY: SHX245

## 2015-11-24 ENCOUNTER — Ambulatory Visit (INDEPENDENT_AMBULATORY_CARE_PROVIDER_SITE_OTHER): Payer: Medicare Other | Admitting: Neurology

## 2015-11-24 ENCOUNTER — Encounter (INDEPENDENT_AMBULATORY_CARE_PROVIDER_SITE_OTHER): Payer: Self-pay

## 2015-11-24 DIAGNOSIS — R200 Anesthesia of skin: Secondary | ICD-10-CM | POA: Diagnosis not present

## 2015-11-24 DIAGNOSIS — M5416 Radiculopathy, lumbar region: Secondary | ICD-10-CM | POA: Diagnosis not present

## 2015-11-24 DIAGNOSIS — Z0289 Encounter for other administrative examinations: Secondary | ICD-10-CM

## 2015-11-24 NOTE — Progress Notes (Signed)
   STUDY DATE: 11/24/15 PATIENT NAME: Michael [name] DOB: Jul 26, 1960 MRN: [id number]  Referred by:  Dr. Glenna Fellows   HISTORY:  He is a 55 year old man reporting pain and numbness in both lower legs and feet. He has history of lumbar surgery in the past. On examination, he reports decrease sensation to touch in both feet in the L5 and S1 dermatomes, more pronounced on the right. Strength was normal. Deep tendon reflexes were 1+ bilaterally at the knees and ankles.  NERVE CONDUCTION STUDIES:  Bilateral peroneal and posterior tibial motor responses had normal amplitude and conduction velocities.  The right tibial distal latency was minimally delayed. H reflex latencies were normal and symmetric. Bilateral superficial peroneal sensory responses were normal.  EMG STUDIES:  Needle EMG of the right iliopsoas, vastus medialis, gastrocnemius and first dorsal interosseous muscles was normal. There were polyphasic high amplitude motor units that recruited neuropathic leg in the right gluteus medius, tibialis anterior and peroneus longus muscles. There was no abnormal spontaneous activity.  IMPRESSION:  This EMG/NCV study shows the following: 1.    Chronic right L5 radiculopathy without active features. 2.    There was no evidence of polyneuropathy.   Richard A. Felecia Shelling, MD, PhD Certified in Neurology, [place] [facility], Sleep Medicine, Pain Medicine and Neuroimaging  [facility] [address], [place], [place] [address] [phone]

## 2016-02-14 ENCOUNTER — Encounter (HOSPITAL_COMMUNITY): Payer: Self-pay | Admitting: *Deleted

## 2016-02-14 ENCOUNTER — Emergency Department (HOSPITAL_COMMUNITY)
Admission: EM | Admit: 2016-02-14 | Discharge: 2016-02-14 | Disposition: A | Discharge status: Home / Self Care | Payer: Medicare Other | Attending: Emergency Medicine | Admitting: Emergency Medicine

## 2016-02-14 DIAGNOSIS — F17210 Nicotine dependence, cigarettes, uncomplicated: Secondary | ICD-10-CM | POA: Insufficient documentation

## 2016-02-14 DIAGNOSIS — M5442 Lumbago with sciatica, left side: Secondary | ICD-10-CM | POA: Insufficient documentation

## 2016-02-14 DIAGNOSIS — G8929 Other chronic pain: Secondary | ICD-10-CM | POA: Diagnosis not present

## 2016-02-14 DIAGNOSIS — M545 Low back pain: Secondary | ICD-10-CM | POA: Diagnosis present

## 2016-02-14 MED ORDER — HYDROMORPHONE HCL 1 MG/ML IJ SOLN
1.0000 mg | Freq: Once | INTRAMUSCULAR | Status: AC
  Administered 2016-02-14: 1 mg via INTRAMUSCULAR
  Filled 2016-02-14: qty 1

## 2016-02-14 MED ORDER — METHOCARBAMOL 500 MG PO TABS: 500.0000 mg | ORAL_TABLET | Freq: Two times a day (BID) | ORAL | 0 refills | Status: DC

## 2016-02-14 NOTE — Discharge Instructions (Signed)
Please read and follow all provided instructions.  Your diagnoses today include:  1. Chronic left-sided low back pain with left-sided sciatica     Tests performed today include: Vital signs - see below for your results today  Medications prescribed:   Take any prescribed medications only as directed.  Home care instructions:  Follow any educational materials contained in this packet Please rest, use ice or heat on your back for the next several days Do not lift, push, pull anything more than 10 pounds for the next week  Follow-up instructions: Please follow-up with your primary care provider in the next 1 week for further evaluation of your symptoms.   Return instructions:  SEEK IMMEDIATE MEDICAL ATTENTION IF YOU HAVE: New numbness, tingling, weakness, or problem with the use of your arms or legs Severe back pain not relieved with medications Increasing pain in any areas of the body (such as chest or abdominal pain) Shortness of breath, dizziness, or fainting.  Worsening nausea (feeling sick to your stomach), vomiting, fever, or sweats Any other emergent concerns regarding your health   Additional Information:  Your vital signs today were: BP 138/94 (BP Location: Left Arm)    Pulse 92    Temp 97.9 F (36.6 C) (Temporal)    Resp 20    Ht 6\' 2"  (1.88 m)    Wt 95.3 kg    SpO2 100%    BMI 26.96 kg/m  If your blood pressure (BP) was elevated above 135/85 this visit, please have this repeated by your doctor within one month. --------------

## 2016-02-14 NOTE — ED Provider Notes (Signed)
[facility] DEPT Provider Note   CSN: [id number] Arrival date & time: 02/14/16  1737  By signing my name below, I, Dora Sims, attest that this documentation has been prepared under the direction and in the presence of Shary Decamp, PA-C. Electronically Signed: Dora Sims, Scribe. 02/14/2016. 7:06 PM.  History   Chief Complaint Chief Complaint  Patient presents with  . Back Pain  . Leg Pain    The history is provided by the patient. No language interpreter was used.     HPI Comments: Michael [name] is a 55 y.o. male with PMHx significant for arthritis and chronic low back pain who presents to the Emergency Department complaining of gradual onset, constant, cramping, severe, left thigh pain for the last month. Pt notes a h/o chronic lower back pain due to a pinched nerve and states the pain radiated into his left thigh a month ago. He reports intermittent numbness throughout his bilateral lower extremities for the last two weeks. He reports BLE numbness on medial and lateral aspect, bladder incontinence and enuresis as well that only occurs at night. He states he has been putting a pad on his bed at night because he has been wetting himself over the last couple of weeks. Pt states he fell down in his bedroom about two weeks ago and this exacerbated his lower back and left thigh pain. He has tried steroid injections, oxycodone, Roxicet, and Lyrica with no relief of his left thigh pain. He notes a h/o bilateral hip replacement and two back surgeries. Pt notes he had an MRI of his back at [facility] a week and a half ago when he had new symptoms x 2 weeks ago; he went here due to severe pain and loss of bladder function. He ambulates with a cane at baseline. He denies bowel incontinence, color change, wounds, fever, chills, or any other associated symptoms.  PCP: Dr. Wenda Overland  Past Medical History:  Diagnosis Date  . Allergic rhinitis   . Arthritis   . Chronic low back pain    follwed in Pain Clinic(Dr.Kirchmayer)  . Depression with anxiety   . GERD (gastroesophageal reflux disease)   . Insomnia   . Migraines   . Sarcoidosis ([place])    Multisystem,pulmonary and hepatic (Dr.Rourk)Liver bx in 06 mildly postive AMA but on re-check normal    Patient Active Problem List   Diagnosis Date Noted  . Numbness 11/24/2015  . Chronic lumbar radiculopathy 11/24/2015  . FULL INCONTINENCE OF FECES 05/09/2010  . DIARRHEA 05/09/2010  . TOBACCO ABUSE 03/27/2008  . ALLERGIC RHINITIS 07/18/2007  . INSOMNIA 07/18/2007  . SARCOIDOSIS 05/09/2007  . ANXIETY 05/09/2007  . DEPRESSION 05/09/2007  . GERD 05/09/2007  . ARTHRITIS 05/09/2007  . LOW BACK PAIN 05/09/2007  . MIGRAINES, HX OF 05/09/2007    Past Surgical History:  Procedure Laterality Date  . BACK SURGERY     2nd back surgery 3 monthes ago and in 2007  . COLONOSCOPY  11/05/2003   [id number] internal hemorrhoids, otherwise normal rectum and colon  . ESOPHAGOGASTRODUODENOSCOPY  03/22/2004   [id number] esophagogastroduodenoscopy. Small hiatal hernia otherwise normal stomach  . HIP SURGERY    . LAMINECTOMY  2/10   Balateral L4-L5       Home Medications    Prior to Admission medications   Medication Sig Start Date End Date Taking? Authorizing Provider  albuterol (PROVENTIL) (2.5 MG/3ML) 0.083% nebulizer solution Take 2.5 mg by nebulization every 6 (six) hours as needed for wheezing or shortness of breath.  Historical Provider, MD  ALPRAZolam Duanne Moron) 0.25 MG tablet Take 0.25 mg by mouth at bedtime. One tablet daily as needed    Historical Provider, MD  ARIPiprazole (ABILIFY) 10 MG tablet Take 10 mg by mouth daily.      Historical Provider, MD  atorvastatin (LIPITOR) 20 MG tablet Take 20 mg by mouth at bedtime. 12/04/13   Historical Provider, MD  fluticasone (FLONASE) 50 MCG/ACT nasal spray Place 2 sprays into the nose daily.     Historical Provider, MD  olopatadine (PATANOL) 0.1 % ophthalmic solution Place 1  drop into both eyes daily.     Historical Provider, MD  Oxycodone HCl 10 MG TABS Take 10 mg by mouth every 4 (four) hours as needed. pain    Historical Provider, MD  pravastatin (PRAVACHOL) 10 MG tablet Take 10 mg by mouth daily.      Historical Provider, MD  pregabalin (LYRICA) 150 MG capsule Take 150 mg by mouth 2 (two) times daily.    Historical Provider, MD  zolpidem (AMBIEN) 10 MG tablet Take 10 mg by mouth at bedtime as needed for sleep.    Historical Provider, MD    Family History No family history on file.  Social History Social History  Substance Use Topics  . Smoking status: Current Some Day Smoker    Packs/day: 3.00    Types: Cigarettes  . Smokeless tobacco: Never Used  . Alcohol use No     Allergies   Aspirin and Methadone   Review of Systems Review of Systems  Constitutional: Negative for chills and fever.  Gastrointestinal:       Negative for bowel incontinence.  Genitourinary: Positive for enuresis.       Positive for bladder incontinence.  Musculoskeletal: Positive for back pain (lower. chronic) and myalgias (left thigh).  Skin: Negative for color change and wound.  Neurological: Positive for numbness (bilateral lower extremities).       Positive for saddle anesthesia.   Physical Exam Updated Vital Signs BP 138/94 (BP Location: Left Arm)   Pulse 92   Temp 97.9 F (36.6 C) (Temporal)   Resp 20   Ht 6\' 2"  (1.88 m)   Wt 210 lb (95.3 kg)   SpO2 100%   BMI 26.96 kg/m   Physical Exam  Constitutional: He is oriented to person, place, and time. Vital signs are normal. He appears well-developed and well-nourished. No distress.  HENT:  Head: Normocephalic and atraumatic.  Right Ear: Hearing normal.  Left Ear: Hearing normal.  Eyes: Conjunctivae and EOM are normal. Pupils are equal, round, and reactive to light.  Neck: Normal range of motion. Neck supple. No tracheal deviation present.  Cardiovascular: Normal rate, regular rhythm, normal heart sounds and  intact distal pulses.   Pulmonary/Chest: Effort normal and breath sounds normal. No respiratory distress.  Abdominal: Soft.  Musculoskeletal: Normal range of motion.  BLE with distal pulses appreciated.   Neurological: He is alert and oriented to person, place, and time. He has normal strength and normal reflexes. No sensory deficit. He displays a negative Romberg sign. He displays no Babinski's sign on the right side. He displays no Babinski's sign on the left side.  Reflex Scores:      Patellar reflexes are 2+ on the right side and 2+ on the left side.      Achilles reflexes are 2+ on the right side and 2+ on the left side. Bilateral lower extremities motor/sensation intact. Differentiates between light and sharp touch. DTRs equal and present.  Ambulates well without abnormal gait.   Skin: Skin is warm and dry.  Psychiatric: He has a normal mood and affect. His speech is normal and behavior is normal. Thought content normal.  Nursing note and vitals reviewed.  ED Treatments / Results  Labs (all labs ordered are listed, but only abnormal results are displayed) Labs Reviewed - No data to display  EKG  EKG Interpretation None      Radiology No results found.  Procedures Procedures (including critical care time)  DIAGNOSTIC STUDIES: Oxygen Saturation is 100% on RA, normal by my interpretation.    COORDINATION OF CARE: 7:14 PM Discussed treatment plan with pt at bedside and pt agreed to plan.  Medications Ordered in ED Medications - No data to display   Initial Impression / Assessment and Plan / ED Course  I have reviewed the triage vital signs and the nursing notes.  Pertinent labs & imaging results that were available during my care of the patient were reviewed by me and considered in my medical decision making (see chart for details).  Clinical Course     Final Clinical Impressions(s) / ED Diagnoses  I have reviewed and evaluated the relevant imaging studies.  I have  reviewed the relevant previous healthcare records.I obtained HPI from historian. Patient discussed with supervising physician  ED Course:  Assessment: Pt is a 34yM with hx arthritis and chronic low back pain who presents with left leg pain x 1 month. Associated enuresis only in the evening and BLE numbness x 2 weeks. Seen at [facility] for same 1.5 weeks ago and had CT done. Sent home with steroids and follow up to PCP. On exam, pt in NAD. Nontoxic/nonseptic appearing. VSS. Afebrile. Lungs CTA. Heart RRR. BLE exam unremarkable. Pt ambulates well without difficulty using cane (baseline). Motor intact with distal pulses appreciated. Pt can differentiate between light and sharp touch. DTRs intact and equal bilaterally. Discussed with supervising physician. Given hx and multiple MRIs in past, no indication for further MRI eval. [facility] about previous CT results at [facility]. Given Rx. Robaxin and follow up to PCP/Pain Management clinic. Pt currently on steroids from [facility]. At time of discharge, Patient is in no acute distress. Vital Signs are stable. Patient is able to ambulate. Patient able to tolerate PO.    Disposition/Plan:  DC Home Additional Verbal discharge instructions given and discussed with patient.  Pt Instructed to f/u with PCP in the next week for evaluation and treatment of symptoms. Return precautions given Pt acknowledges and agrees with plan  Supervising Physician Daleen Bo, MD  Final diagnoses:  Chronic left-sided low back pain with left-sided sciatica   I personally performed the services described in this documentation, which was scribed in my presence. The recorded information has been reviewed and is accurate.    New Prescriptions New Prescriptions   No medications on file     Shary Decamp, PA-C 02/15/16 0004    Daleen Bo, MD 02/15/16 [phone]

## 2016-02-14 NOTE — ED Triage Notes (Signed)
Pt comes in with lower back pain that moves into his left leg. Denies any injury. States he has chronic back pain.

## 2016-04-06 DIAGNOSIS — R45851 Suicidal ideations: Secondary | ICD-10-CM | POA: Diagnosis present

## 2016-04-06 DIAGNOSIS — F3110 Bipolar disorder, current episode manic without psychotic features, unspecified: Secondary | ICD-10-CM | POA: Diagnosis present

## 2016-04-06 DIAGNOSIS — F332 Major depressive disorder, recurrent severe without psychotic features: Secondary | ICD-10-CM | POA: Diagnosis not present

## 2016-04-06 DIAGNOSIS — F17210 Nicotine dependence, cigarettes, uncomplicated: Secondary | ICD-10-CM | POA: Diagnosis present

## 2016-04-18 DIAGNOSIS — B351 Tinea unguium: Secondary | ICD-10-CM | POA: Diagnosis not present

## 2016-04-28 DIAGNOSIS — Z79891 Long term (current) use of opiate analgesic: Secondary | ICD-10-CM | POA: Diagnosis not present

## 2016-04-28 DIAGNOSIS — I10 Essential (primary) hypertension: Secondary | ICD-10-CM | POA: Diagnosis not present

## 2016-04-28 DIAGNOSIS — R29898 Other symptoms and signs involving the musculoskeletal system: Secondary | ICD-10-CM | POA: Diagnosis not present

## 2016-04-28 DIAGNOSIS — Z5181 Encounter for therapeutic drug level monitoring: Secondary | ICD-10-CM | POA: Diagnosis not present

## 2016-04-28 DIAGNOSIS — M545 Low back pain: Secondary | ICD-10-CM | POA: Diagnosis not present

## 2016-04-28 DIAGNOSIS — M5416 Radiculopathy, lumbar region: Secondary | ICD-10-CM | POA: Diagnosis not present

## 2016-04-28 DIAGNOSIS — Z79899 Other long term (current) drug therapy: Secondary | ICD-10-CM | POA: Diagnosis not present

## 2016-05-08 DIAGNOSIS — R262 Difficulty in walking, not elsewhere classified: Secondary | ICD-10-CM | POA: Diagnosis not present

## 2016-05-08 DIAGNOSIS — M6281 Muscle weakness (generalized): Secondary | ICD-10-CM | POA: Diagnosis not present

## 2016-05-08 DIAGNOSIS — R29898 Other symptoms and signs involving the musculoskeletal system: Secondary | ICD-10-CM | POA: Diagnosis not present

## 2016-05-08 DIAGNOSIS — M5432 Sciatica, left side: Secondary | ICD-10-CM | POA: Diagnosis not present

## 2016-05-09 DIAGNOSIS — M5410 Radiculopathy, site unspecified: Secondary | ICD-10-CM | POA: Diagnosis not present

## 2016-05-09 DIAGNOSIS — Z96641 Presence of right artificial hip joint: Secondary | ICD-10-CM | POA: Diagnosis not present

## 2016-05-09 DIAGNOSIS — M879 Osteonecrosis, unspecified: Secondary | ICD-10-CM | POA: Diagnosis not present

## 2016-05-09 DIAGNOSIS — Z96642 Presence of left artificial hip joint: Secondary | ICD-10-CM | POA: Diagnosis not present

## 2016-05-15 DIAGNOSIS — M6281 Muscle weakness (generalized): Secondary | ICD-10-CM | POA: Diagnosis not present

## 2016-05-15 DIAGNOSIS — R29898 Other symptoms and signs involving the musculoskeletal system: Secondary | ICD-10-CM | POA: Diagnosis not present

## 2016-05-15 DIAGNOSIS — R262 Difficulty in walking, not elsewhere classified: Secondary | ICD-10-CM | POA: Diagnosis not present

## 2016-05-15 DIAGNOSIS — M5432 Sciatica, left side: Secondary | ICD-10-CM | POA: Diagnosis not present

## 2016-05-26 DIAGNOSIS — M5432 Sciatica, left side: Secondary | ICD-10-CM | POA: Diagnosis not present

## 2016-05-26 DIAGNOSIS — R262 Difficulty in walking, not elsewhere classified: Secondary | ICD-10-CM | POA: Diagnosis not present

## 2016-05-26 DIAGNOSIS — R29898 Other symptoms and signs involving the musculoskeletal system: Secondary | ICD-10-CM | POA: Diagnosis not present

## 2016-05-26 DIAGNOSIS — M6281 Muscle weakness (generalized): Secondary | ICD-10-CM | POA: Diagnosis not present

## 2016-05-29 DIAGNOSIS — M5432 Sciatica, left side: Secondary | ICD-10-CM | POA: Diagnosis not present

## 2016-05-29 DIAGNOSIS — M6281 Muscle weakness (generalized): Secondary | ICD-10-CM | POA: Diagnosis not present

## 2016-05-29 DIAGNOSIS — R262 Difficulty in walking, not elsewhere classified: Secondary | ICD-10-CM | POA: Diagnosis not present

## 2016-05-29 DIAGNOSIS — R29898 Other symptoms and signs involving the musculoskeletal system: Secondary | ICD-10-CM | POA: Diagnosis not present

## 2016-05-30 DIAGNOSIS — F419 Anxiety disorder, unspecified: Secondary | ICD-10-CM | POA: Diagnosis not present

## 2016-05-30 DIAGNOSIS — Z79899 Other long term (current) drug therapy: Secondary | ICD-10-CM | POA: Diagnosis not present

## 2016-05-30 DIAGNOSIS — B351 Tinea unguium: Secondary | ICD-10-CM | POA: Diagnosis not present

## 2016-05-30 DIAGNOSIS — G894 Chronic pain syndrome: Secondary | ICD-10-CM | POA: Diagnosis not present

## 2016-06-01 DIAGNOSIS — R29898 Other symptoms and signs involving the musculoskeletal system: Secondary | ICD-10-CM | POA: Diagnosis not present

## 2016-06-01 DIAGNOSIS — M5432 Sciatica, left side: Secondary | ICD-10-CM | POA: Diagnosis not present

## 2016-06-01 DIAGNOSIS — M6281 Muscle weakness (generalized): Secondary | ICD-10-CM | POA: Diagnosis not present

## 2016-06-01 DIAGNOSIS — R262 Difficulty in walking, not elsewhere classified: Secondary | ICD-10-CM | POA: Diagnosis not present

## 2016-06-05 DIAGNOSIS — F419 Anxiety disorder, unspecified: Secondary | ICD-10-CM | POA: Diagnosis not present

## 2016-06-06 DIAGNOSIS — R29898 Other symptoms and signs involving the musculoskeletal system: Secondary | ICD-10-CM | POA: Diagnosis not present

## 2016-06-06 DIAGNOSIS — M5432 Sciatica, left side: Secondary | ICD-10-CM | POA: Diagnosis not present

## 2016-06-06 DIAGNOSIS — R262 Difficulty in walking, not elsewhere classified: Secondary | ICD-10-CM | POA: Diagnosis not present

## 2016-06-06 DIAGNOSIS — M6281 Muscle weakness (generalized): Secondary | ICD-10-CM | POA: Diagnosis not present

## 2016-06-09 DIAGNOSIS — R262 Difficulty in walking, not elsewhere classified: Secondary | ICD-10-CM | POA: Diagnosis not present

## 2016-06-09 DIAGNOSIS — M6281 Muscle weakness (generalized): Secondary | ICD-10-CM | POA: Diagnosis not present

## 2016-06-09 DIAGNOSIS — M5432 Sciatica, left side: Secondary | ICD-10-CM | POA: Diagnosis not present

## 2016-06-09 DIAGNOSIS — R29898 Other symptoms and signs involving the musculoskeletal system: Secondary | ICD-10-CM | POA: Diagnosis not present

## 2016-06-12 DIAGNOSIS — M6281 Muscle weakness (generalized): Secondary | ICD-10-CM | POA: Diagnosis not present

## 2016-06-12 DIAGNOSIS — M5432 Sciatica, left side: Secondary | ICD-10-CM | POA: Diagnosis not present

## 2016-06-12 DIAGNOSIS — R262 Difficulty in walking, not elsewhere classified: Secondary | ICD-10-CM | POA: Diagnosis not present

## 2016-06-12 DIAGNOSIS — R29898 Other symptoms and signs involving the musculoskeletal system: Secondary | ICD-10-CM | POA: Diagnosis not present

## 2016-06-19 DIAGNOSIS — M6281 Muscle weakness (generalized): Secondary | ICD-10-CM | POA: Diagnosis not present

## 2016-06-19 DIAGNOSIS — R262 Difficulty in walking, not elsewhere classified: Secondary | ICD-10-CM | POA: Diagnosis not present

## 2016-06-19 DIAGNOSIS — M5432 Sciatica, left side: Secondary | ICD-10-CM | POA: Diagnosis not present

## 2016-06-19 DIAGNOSIS — R29898 Other symptoms and signs involving the musculoskeletal system: Secondary | ICD-10-CM | POA: Diagnosis not present

## 2016-06-21 DIAGNOSIS — M5432 Sciatica, left side: Secondary | ICD-10-CM | POA: Diagnosis not present

## 2016-06-21 DIAGNOSIS — M6281 Muscle weakness (generalized): Secondary | ICD-10-CM | POA: Diagnosis not present

## 2016-06-21 DIAGNOSIS — R262 Difficulty in walking, not elsewhere classified: Secondary | ICD-10-CM | POA: Diagnosis not present

## 2016-06-21 DIAGNOSIS — R29898 Other symptoms and signs involving the musculoskeletal system: Secondary | ICD-10-CM | POA: Diagnosis not present

## 2016-06-22 DIAGNOSIS — M5416 Radiculopathy, lumbar region: Secondary | ICD-10-CM | POA: Diagnosis not present

## 2016-06-23 DIAGNOSIS — F4320 Adjustment disorder, unspecified: Secondary | ICD-10-CM | POA: Diagnosis not present

## 2016-06-26 DIAGNOSIS — M5432 Sciatica, left side: Secondary | ICD-10-CM | POA: Diagnosis not present

## 2016-06-26 DIAGNOSIS — M6281 Muscle weakness (generalized): Secondary | ICD-10-CM | POA: Diagnosis not present

## 2016-06-26 DIAGNOSIS — R29898 Other symptoms and signs involving the musculoskeletal system: Secondary | ICD-10-CM | POA: Diagnosis not present

## 2016-06-26 DIAGNOSIS — R262 Difficulty in walking, not elsewhere classified: Secondary | ICD-10-CM | POA: Diagnosis not present

## 2016-06-28 DIAGNOSIS — M6281 Muscle weakness (generalized): Secondary | ICD-10-CM | POA: Diagnosis not present

## 2016-06-28 DIAGNOSIS — R262 Difficulty in walking, not elsewhere classified: Secondary | ICD-10-CM | POA: Diagnosis not present

## 2016-06-28 DIAGNOSIS — M5432 Sciatica, left side: Secondary | ICD-10-CM | POA: Diagnosis not present

## 2016-06-28 DIAGNOSIS — R29898 Other symptoms and signs involving the musculoskeletal system: Secondary | ICD-10-CM | POA: Diagnosis not present

## 2016-07-03 DIAGNOSIS — R262 Difficulty in walking, not elsewhere classified: Secondary | ICD-10-CM | POA: Diagnosis not present

## 2016-07-03 DIAGNOSIS — M6281 Muscle weakness (generalized): Secondary | ICD-10-CM | POA: Diagnosis not present

## 2016-07-03 DIAGNOSIS — M5432 Sciatica, left side: Secondary | ICD-10-CM | POA: Diagnosis not present

## 2016-07-03 DIAGNOSIS — R29898 Other symptoms and signs involving the musculoskeletal system: Secondary | ICD-10-CM | POA: Diagnosis not present

## 2016-07-24 DIAGNOSIS — M5416 Radiculopathy, lumbar region: Secondary | ICD-10-CM | POA: Diagnosis not present

## 2016-07-24 DIAGNOSIS — I10 Essential (primary) hypertension: Secondary | ICD-10-CM | POA: Diagnosis not present

## 2016-07-24 DIAGNOSIS — M545 Low back pain: Secondary | ICD-10-CM | POA: Diagnosis not present

## 2016-07-24 DIAGNOSIS — Z6825 Body mass index (BMI) 25.0-25.9, adult: Secondary | ICD-10-CM | POA: Diagnosis not present

## 2016-10-17 ENCOUNTER — Encounter (HOSPITAL_COMMUNITY): Payer: Self-pay | Admitting: Emergency Medicine

## 2016-10-17 ENCOUNTER — Emergency Department (HOSPITAL_COMMUNITY): Payer: No Typology Code available for payment source

## 2016-10-17 ENCOUNTER — Emergency Department (HOSPITAL_COMMUNITY)
Admission: EM | Admit: 2016-10-17 | Discharge: 2016-10-17 | Disposition: A | Discharge status: Home / Self Care | Payer: No Typology Code available for payment source | Attending: Emergency Medicine | Admitting: Emergency Medicine

## 2016-10-17 DIAGNOSIS — F17210 Nicotine dependence, cigarettes, uncomplicated: Secondary | ICD-10-CM | POA: Diagnosis not present

## 2016-10-17 DIAGNOSIS — M79674 Pain in right toe(s): Secondary | ICD-10-CM | POA: Diagnosis not present

## 2016-10-17 DIAGNOSIS — R079 Chest pain, unspecified: Secondary | ICD-10-CM | POA: Diagnosis not present

## 2016-10-17 DIAGNOSIS — S199XXA Unspecified injury of neck, initial encounter: Secondary | ICD-10-CM | POA: Diagnosis not present

## 2016-10-17 DIAGNOSIS — R109 Unspecified abdominal pain: Secondary | ICD-10-CM | POA: Insufficient documentation

## 2016-10-17 DIAGNOSIS — Y92410 Unspecified street and highway as the place of occurrence of the external cause: Secondary | ICD-10-CM | POA: Diagnosis not present

## 2016-10-17 DIAGNOSIS — M545 Low back pain: Secondary | ICD-10-CM | POA: Diagnosis not present

## 2016-10-17 DIAGNOSIS — Y999 Unspecified external cause status: Secondary | ICD-10-CM | POA: Insufficient documentation

## 2016-10-17 DIAGNOSIS — Y939 Activity, unspecified: Secondary | ICD-10-CM | POA: Insufficient documentation

## 2016-10-17 DIAGNOSIS — M546 Pain in thoracic spine: Secondary | ICD-10-CM | POA: Diagnosis not present

## 2016-10-17 DIAGNOSIS — R51 Headache: Secondary | ICD-10-CM | POA: Insufficient documentation

## 2016-10-17 DIAGNOSIS — T148XXA Other injury of unspecified body region, initial encounter: Secondary | ICD-10-CM | POA: Diagnosis not present

## 2016-10-17 DIAGNOSIS — S0990XA Unspecified injury of head, initial encounter: Secondary | ICD-10-CM | POA: Diagnosis not present

## 2016-10-17 DIAGNOSIS — S3993XA Unspecified injury of pelvis, initial encounter: Secondary | ICD-10-CM | POA: Diagnosis not present

## 2016-10-17 DIAGNOSIS — Z79899 Other long term (current) drug therapy: Secondary | ICD-10-CM | POA: Insufficient documentation

## 2016-10-17 DIAGNOSIS — S3991XA Unspecified injury of abdomen, initial encounter: Secondary | ICD-10-CM | POA: Diagnosis not present

## 2016-10-17 DIAGNOSIS — S299XXA Unspecified injury of thorax, initial encounter: Secondary | ICD-10-CM | POA: Diagnosis not present

## 2016-10-17 LAB — COMPREHENSIVE METABOLIC PANEL
ALK PHOS: 94 U/L (ref 38–126)
ALT: 14 U/L — AB (ref 17–63)
AST: 18 U/L (ref 15–41)
Albumin: 3.7 g/dL (ref 3.5–5.0)
Anion gap: 8 (ref 5–15)
BUN: 15 mg/dL (ref 6–20)
CALCIUM: 9.5 mg/dL (ref 8.9–10.3)
CO2: 24 mmol/L (ref 22–32)
CREATININE: 1.57 mg/dL — AB (ref 0.61–1.24)
Chloride: 106 mmol/L (ref 101–111)
GFR calc non Af Amer: 49 mL/min — ABNORMAL LOW (ref >60)
GFR, EST AFRICAN AMERICAN: 56 mL/min — AB (ref >60)
GLUCOSE: 93 mg/dL (ref 65–99)
Potassium: 3.6 mmol/L (ref 3.5–5.1)
Sodium: 138 mmol/L (ref 135–145)
Total Bilirubin: 0.5 mg/dL (ref 0.3–1.2)
Total Protein: 7.3 g/dL (ref 6.5–8.1)

## 2016-10-17 LAB — CBC
HCT: 39.3 % (ref 39.0–52.0)
Hemoglobin: 13 g/dL (ref 13.0–17.0)
MCH: 30 pg (ref 26.0–34.0)
MCHC: 33.1 g/dL (ref 30.0–36.0)
MCV: 90.6 fL (ref 78.0–100.0)
PLATELETS: 308 10*3/uL (ref 150–400)
RBC: 4.34 MIL/uL (ref 4.22–5.81)
RDW: 14.8 % (ref 11.5–15.5)
WBC: 7.9 10*3/uL (ref 4.0–10.5)

## 2016-10-17 LAB — URINALYSIS, ROUTINE W REFLEX MICROSCOPIC
Bacteria, UA: NONE SEEN
Bilirubin Urine: NEGATIVE
GLUCOSE, UA: NEGATIVE mg/dL
Ketones, ur: NEGATIVE mg/dL
Leukocytes, UA: NEGATIVE
Nitrite: NEGATIVE
PH: 5 (ref 5.0–8.0)
Protein, ur: NEGATIVE mg/dL
SQUAMOUS EPITHELIAL / LPF: NONE SEEN
Specific Gravity, Urine: 1.029 (ref 1.005–1.030)

## 2016-10-17 LAB — LIPASE, BLOOD: Lipase: 36 U/L (ref 11–51)

## 2016-10-17 MED ORDER — IOPAMIDOL (ISOVUE-300) INJECTION 61%
INTRAVENOUS | Status: AC
  Administered 2016-10-17: 100 mL
  Filled 2016-10-17: qty 100

## 2016-10-17 MED ORDER — MORPHINE SULFATE (PF) 4 MG/ML IV SOLN
4.0000 mg | Freq: Once | INTRAVENOUS | Status: AC
  Administered 2016-10-17: 4 mg via INTRAVENOUS
  Filled 2016-10-17: qty 1

## 2016-10-17 NOTE — ED Notes (Signed)
Pt is in CT department.
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
## 2016-10-17 NOTE — ED Notes (Signed)
PA spoke with pt regarding d/c instructions.
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
## 2016-10-17 NOTE — ED Notes (Addendum)
Pt made aware of need for urine sample. Pt provided with urinal.

## 2016-10-23 DIAGNOSIS — M5416 Radiculopathy, lumbar region: Secondary | ICD-10-CM | POA: Diagnosis not present

## 2016-10-23 DIAGNOSIS — M545 Low back pain: Secondary | ICD-10-CM | POA: Diagnosis not present

## 2016-10-23 DIAGNOSIS — Z6825 Body mass index (BMI) 25.0-25.9, adult: Secondary | ICD-10-CM | POA: Diagnosis not present

## 2016-10-23 DIAGNOSIS — R030 Elevated blood-pressure reading, without diagnosis of hypertension: Secondary | ICD-10-CM | POA: Diagnosis not present

## 2017-01-04 DIAGNOSIS — D219 Benign neoplasm of connective and other soft tissue, unspecified: Secondary | ICD-10-CM | POA: Diagnosis not present

## 2017-01-04 DIAGNOSIS — M25572 Pain in left ankle and joints of left foot: Secondary | ICD-10-CM | POA: Diagnosis not present

## 2017-01-10 DIAGNOSIS — M19072 Primary osteoarthritis, left ankle and foot: Secondary | ICD-10-CM | POA: Diagnosis not present

## 2017-01-18 DIAGNOSIS — R030 Elevated blood-pressure reading, without diagnosis of hypertension: Secondary | ICD-10-CM | POA: Diagnosis not present

## 2017-01-18 DIAGNOSIS — M545 Low back pain: Secondary | ICD-10-CM | POA: Diagnosis not present

## 2017-01-18 DIAGNOSIS — M5416 Radiculopathy, lumbar region: Secondary | ICD-10-CM | POA: Diagnosis not present

## 2017-01-24 DIAGNOSIS — Z9889 Other specified postprocedural states: Secondary | ICD-10-CM | POA: Insufficient documentation

## 2017-01-24 DIAGNOSIS — M5410 Radiculopathy, site unspecified: Secondary | ICD-10-CM | POA: Insufficient documentation

## 2017-01-24 DIAGNOSIS — M79605 Pain in left leg: Secondary | ICD-10-CM | POA: Diagnosis not present

## 2017-01-31 DIAGNOSIS — M5410 Radiculopathy, site unspecified: Secondary | ICD-10-CM | POA: Diagnosis not present

## 2017-01-31 DIAGNOSIS — M47816 Spondylosis without myelopathy or radiculopathy, lumbar region: Secondary | ICD-10-CM | POA: Diagnosis not present

## 2017-01-31 DIAGNOSIS — M5127 Other intervertebral disc displacement, lumbosacral region: Secondary | ICD-10-CM | POA: Diagnosis not present

## 2017-01-31 DIAGNOSIS — M5126 Other intervertebral disc displacement, lumbar region: Secondary | ICD-10-CM | POA: Diagnosis not present

## 2017-01-31 DIAGNOSIS — M545 Low back pain: Secondary | ICD-10-CM | POA: Diagnosis not present

## 2017-04-17 DIAGNOSIS — M545 Low back pain: Secondary | ICD-10-CM | POA: Diagnosis not present

## 2017-04-17 DIAGNOSIS — R030 Elevated blood-pressure reading, without diagnosis of hypertension: Secondary | ICD-10-CM | POA: Diagnosis not present

## 2017-04-17 DIAGNOSIS — M5416 Radiculopathy, lumbar region: Secondary | ICD-10-CM | POA: Diagnosis not present

## 2017-04-18 ENCOUNTER — Encounter: Payer: Self-pay | Admitting: Internal Medicine

## 2017-05-03 ENCOUNTER — Ambulatory Visit: Payer: Medicare Other | Admitting: Gastroenterology

## 2017-05-08 ENCOUNTER — Other Ambulatory Visit: Payer: Self-pay

## 2017-05-08 ENCOUNTER — Emergency Department (HOSPITAL_COMMUNITY)
Admission: EM | Admit: 2017-05-08 | Discharge: 2017-05-08 | Disposition: A | Discharge status: Home / Self Care | Payer: Medicare Other | Attending: Emergency Medicine | Admitting: Emergency Medicine

## 2017-05-08 ENCOUNTER — Encounter (HOSPITAL_COMMUNITY): Payer: Self-pay | Admitting: Emergency Medicine

## 2017-05-08 DIAGNOSIS — K130 Diseases of lips: Secondary | ICD-10-CM

## 2017-05-08 DIAGNOSIS — Z79899 Other long term (current) drug therapy: Secondary | ICD-10-CM | POA: Diagnosis not present

## 2017-05-08 DIAGNOSIS — F17210 Nicotine dependence, cigarettes, uncomplicated: Secondary | ICD-10-CM | POA: Diagnosis not present

## 2017-05-08 MED ORDER — SULFAMETHOXAZOLE-TRIMETHOPRIM 800-160 MG PO TABS: 1.0000 | ORAL_TABLET | Freq: Two times a day (BID) | ORAL | 0 refills | Status: AC

## 2017-05-08 MED ORDER — SULFAMETHOXAZOLE-TRIMETHOPRIM 800-160 MG PO TABS
1.0000 | ORAL_TABLET | Freq: Once | ORAL | Status: AC
  Administered 2017-05-08: 1 via ORAL
  Filled 2017-05-08: qty 1

## 2017-05-08 NOTE — ED Provider Notes (Signed)
[facility] EMERGENCY DEPARTMENT Provider Note   CSN: [id number] Arrival date & time: 05/08/17  1022     History   Chief Complaint Chief Complaint  Patient presents with  . Abscess    HPI Michael [name] is a 57 y.o. male.  HPI  57 year old male who is had several days of swelling to the left upper lip, there was an area of purulence, he states that he took a needle and put it through the skin causing drainage of some pus yesterday.  It is still swollen today.  There is no fevers.  The patient does endorse a history of sarcoidosis but has no history of diabetes.  He reports no pain in his teeth though he does have poor dentition.  Symptoms are persistent, worse with palpation, not associated with fevers.  Past Medical History:  Diagnosis Date  . Allergic rhinitis   . Arthritis   . Chronic low back pain    follwed in Pain Clinic(Dr.Kirchmayer)  . Depression with anxiety   . GERD (gastroesophageal reflux disease)   . Insomnia   . Migraines   . Sarcoidosis    Multisystem,pulmonary and hepatic (Dr.Rourk)Liver bx in 06 mildly postive AMA but on re-check normal    Patient Active Problem List   Diagnosis Date Noted  . Numbness 11/24/2015  . Chronic lumbar radiculopathy 11/24/2015  . FULL INCONTINENCE OF FECES 05/09/2010  . DIARRHEA 05/09/2010  . TOBACCO ABUSE 03/27/2008  . ALLERGIC RHINITIS 07/18/2007  . INSOMNIA 07/18/2007  . SARCOIDOSIS 05/09/2007  . ANXIETY 05/09/2007  . DEPRESSION 05/09/2007  . GERD 05/09/2007  . ARTHRITIS 05/09/2007  . LOW BACK PAIN 05/09/2007  . MIGRAINES, HX OF 05/09/2007    Past Surgical History:  Procedure Laterality Date  . BACK SURGERY     2nd back surgery 3 monthes ago and in 2007  . COLONOSCOPY  11/05/2003   [id number] internal hemorrhoids, otherwise normal rectum and colon  . ESOPHAGOGASTRODUODENOSCOPY  03/22/2004   [id number] esophagogastroduodenoscopy. Small hiatal hernia otherwise normal stomach  . HIP SURGERY    . LAMINECTOMY   2/10   Balateral L4-L5       Home Medications    Prior to Admission medications   Medication Sig Start Date End Date Taking? Authorizing Provider  albuterol (PROVENTIL) (2.5 MG/3ML) 0.083% nebulizer solution Take 2.5 mg by nebulization every 6 (six) hours as needed for wheezing or shortness of breath.     [provider]  ALPRAZolam Duanne Moron) 0.25 MG tablet Take 0.25 mg by mouth at bedtime.     [provider]  ARIPiprazole (ABILIFY) 10 MG tablet Take 10 mg by mouth daily.      [provider]  atorvastatin (LIPITOR) 20 MG tablet Take 20 mg by mouth at bedtime. 12/04/13   [provider]  fluticasone (FLONASE) 50 MCG/ACT nasal spray Place 2 sprays into the nose daily.     [provider]  methocarbamol (ROBAXIN) 500 MG tablet Take 1 tablet (500 mg total) by mouth 2 (two) times daily. Patient not taking: Reported on 10/17/2016 02/14/16   Shary Decamp, PA-C  olopatadine (PATANOL) 0.1 % ophthalmic solution Place 1 drop into both eyes daily.     [provider]  Oxycodone HCl 10 MG TABS Take 10 mg by mouth 4 (four) times daily as needed (for pain).     [provider]  pravastatin (PRAVACHOL) 10 MG tablet Take 10 mg by mouth daily.      [provider]  pregabalin (LYRICA)  150 MG capsule Take 150 mg by mouth 3 (three) times daily.     [provider]  zolpidem (AMBIEN) 10 MG tablet Take 10 mg by mouth at bedtime as needed for sleep.    [provider]    Family History History reviewed. No pertinent family history.  Social History Social History   Tobacco Use  . Smoking status: Current Some Day Smoker    Packs/day: 3.00    Types: Cigarettes  . Smokeless tobacco: Never Used  Substance Use Topics  . Alcohol use: No  . Drug use: No     Allergies   Aspirin and Methadone   Review of Systems Review of Systems  Constitutional: Negative for fever.  HENT:       Lip swelling  Skin: Positive for  rash.     Physical Exam Updated Vital Signs BP (!) 152/98   Pulse 69   Temp 98.3 F (36.8 C) (Oral)   Resp 18   Ht 6\' 2"  (1.88 m)   Wt 95.3 kg (210 lb)   SpO2 100%   BMI 26.96 kg/m   Physical Exam  Constitutional: He appears well-developed and well-nourished.  HENT:  Head: Normocephalic and atraumatic.  Small area to the left upper lip, less than 1 cm in size with a central area of purulent drainage, there is very poor dentition inside the mouth but no signs of peri-dental swelling or gingival swelling.  Able to open and close the mouth without difficulty.  Eyes: Conjunctivae are normal. Right eye exhibits no discharge. Left eye exhibits no discharge.  Pulmonary/Chest: Effort normal. No respiratory distress.  Neurological: He is alert. Coordination normal.  Skin: Skin is warm and dry. No rash noted. He is not diaphoretic. No erythema.  Redness to this left upper lip, this does not involve the mucosal surface or the vermilion border.  Psychiatric: He has a normal mood and affect.  Nursing note and vitals reviewed.    ED Treatments / Results  Labs (all labs ordered are listed, but only abnormal results are displayed) Labs Reviewed - No data to display  Radiology No results found.  Procedures Procedures (including critical care time)  Medications Ordered in ED Medications  sulfamethoxazole-trimethoprim (BACTRIM DS,SEPTRA DS) 800-160 MG per tablet 1 tablet (not administered)     Initial Impression / Assessment and Plan / ED Course  I have reviewed the triage vital signs and the nursing notes.  Pertinent labs & imaging results that were available during my care of the patient were reviewed by me and considered in my medical decision making (see chart for details).     Signs of an abscess to the left upper lip, it is very small, there is very little swelling there but it is present with some redness, will start Bactrim, warm compresses, patient informed that he  should return if symptoms worsen with increased swelling but at this time it does not warrant a drainage procedure.  Final Clinical Impressions(s) / ED Diagnoses   Final diagnoses:  Lip abscess    ED Discharge Orders        Ordered    sulfamethoxazole-trimethoprim (BACTRIM DS,SEPTRA DS) 800-160 MG tablet  2 times daily     05/08/17 1204       Noemi Chapel, MD 05/08/17 1205

## 2017-05-08 NOTE — ED Triage Notes (Signed)
Pt has abscess to upper lip.  Pt cut abscess open yesterday with drainage. No drainage today. Redness noted. Denies fever

## 2017-05-08 NOTE — Discharge Instructions (Signed)
You have a very small abscess to your upper lip, please apply warm compresses several times a day, take Bactrim twice a day for 10 days, please do not mesh on this area but once it starts to drain keep a bandage over it.  You should follow-up within 48 hours if you are having increasing pain swelling fevers or redness.

## 2017-05-16 ENCOUNTER — Telehealth: Payer: Self-pay | Admitting: Gastroenterology

## 2017-05-16 ENCOUNTER — Ambulatory Visit: Payer: Self-pay | Admitting: Gastroenterology

## 2017-05-16 ENCOUNTER — Encounter: Payer: Self-pay | Admitting: Gastroenterology

## 2017-05-16 NOTE — Telephone Encounter (Signed)
PATIENT WAS A NO SHOW AND LETTER SENT  °

## 2017-05-24 ENCOUNTER — Encounter: Payer: Self-pay | Admitting: Internal Medicine

## 2017-05-24 ENCOUNTER — Telehealth: Payer: Self-pay

## 2017-05-24 NOTE — Telephone Encounter (Signed)
Pt missed his appointment last week due to being sick. Pt is still sick and would like to schedule an appointment for next month. Pt can be reached at home # [phone]

## 2017-05-24 NOTE — Telephone Encounter (Signed)
Called patient and rescheduled him also mailed appointment letter

## 2017-06-11 ENCOUNTER — Telehealth: Payer: Self-pay

## 2017-06-11 ENCOUNTER — Other Ambulatory Visit: Payer: Self-pay

## 2017-06-11 ENCOUNTER — Encounter: Payer: Self-pay | Admitting: Gastroenterology

## 2017-06-11 ENCOUNTER — Ambulatory Visit (INDEPENDENT_AMBULATORY_CARE_PROVIDER_SITE_OTHER): Payer: Medicare Other | Admitting: Gastroenterology

## 2017-06-11 VITALS — BP 127/75 | HR 74 | Temp 97.2°F | Ht 74.0 in | Wt 189.2 lb

## 2017-06-11 DIAGNOSIS — R112 Nausea with vomiting, unspecified: Secondary | ICD-10-CM

## 2017-06-11 DIAGNOSIS — R109 Unspecified abdominal pain: Secondary | ICD-10-CM | POA: Diagnosis not present

## 2017-06-11 DIAGNOSIS — R159 Full incontinence of feces: Secondary | ICD-10-CM | POA: Diagnosis not present

## 2017-06-11 MED ORDER — PANTOPRAZOLE SODIUM 40 MG PO TBEC: 40.0000 mg | DELAYED_RELEASE_TABLET | Freq: Every day | ORAL | 3 refills | Status: DC

## 2017-06-11 NOTE — Patient Instructions (Signed)
Please have blood work done today. I have also ordered a CT scan.  You have been scheduled for an upper endoscopy with Dr. Gala Romney in the near future. I still recommend a colonoscopy in the near future when you feel you are able.   Start taking Protonix once each morning, 30 minutes before breakfast. This is for reflux.  I will see you in 3 months!  It was a pleasure to see you today. I strive to create trusting relationships with patients to provide genuine, compassionate, and quality care. I value your feedback. If you receive a survey regarding your visit,  I greatly appreciate you taking time to fill this out.   Annitta Needs, PhD, ANP-BC [facility]

## 2017-06-11 NOTE — Progress Notes (Signed)
Primary Care Physician:  Patient, No Pcp Per Primary Gastroenterologist:  Dr. Gala Romney   Chief Complaint  Patient presents with  . Abdominal Pain    middle of abd  . Emesis  . incontinence of feces    HPI:   Michael [name] is a 57 y.o. male presenting today at the request of the Pain Clinic in [place]. He was last seen in 2012 by this practice. History of multisystem sarcoidosis with hepatic involvement. Last colonoscopy 2005 with internal hemorrhoids, otherwise normal. EGD also completed remote past normal, small hiatal hernia.   He has noted N/V for months. "nasty" taste in his mouth. Notes umbilical pain, unrelated to eating/drinking. Always present. States he weighed in the 220s several months ago, now 189. Living on chips and soda. No dysphagia. Last month saw bright red blood per rectum. Will have BMs up to 4 times a day, but this morning was just once. Some days nothing at all. Notes occasional incontinence. He has noted full incontinence when walking and notes it is liquid. Present for at least 2 years.   Referred by Pain Clinic in [place]. N/V for months. Has had abdominal pain in stomach. Nasty taste in mouth. Umbilical pain. Not related to eating/drinking. Always present. 225 about 3 months ago, now 189. Chips and soda. No dysphagia. Last month has seen some bright red blood.   Will go up to 4 times a day sometimes. This morning just once. Some days nothing at all. Sometimes incontinence. Sometimes will just be walking around and it is liquid. Present for 2 years. He is under significant stress. States his brother was diagnosed with colon cancer in his 41s.   Liver biopsy in 2006 with sarcoidosis. History of mildly positive AMA but on re-check normal.     Past Medical History:  Diagnosis Date  . Allergic rhinitis   . Arthritis   . Chronic low back pain    follwed in Pain Clinic(Dr.Kirchmayer)  . Depression with anxiety   . GERD (gastroesophageal reflux disease)     . Insomnia   . Migraines   . Sarcoidosis    Multisystem,pulmonary and hepatic (Dr.Rourk)Liver bx in 06 mildly postive AMA but on re-check normal    Past Surgical History:  Procedure Laterality Date  . BACK SURGERY     2nd back surgery 3 monthes ago and in 2007  . COLONOSCOPY  11/05/2003   [id number] internal hemorrhoids, otherwise normal rectum and colon  . ESOPHAGOGASTRODUODENOSCOPY  03/22/2004   [id number] esophagogastroduodenoscopy. Small hiatal hernia otherwise normal stomach  . HIP SURGERY    . LAMINECTOMY  2/10   Balateral L4-L5    Current Outpatient Medications  Medication Sig Dispense Refill  . albuterol (PROVENTIL) (2.5 MG/3ML) 0.083% nebulizer solution Take 2.5 mg by nebulization every 6 (six) hours as needed for wheezing or shortness of breath.     . ARIPiprazole (ABILIFY) 10 MG tablet Take 10 mg by mouth daily.      . fluticasone (FLONASE) 50 MCG/ACT nasal spray Place 2 sprays into the nose daily.     . methocarbamol (ROBAXIN) 500 MG tablet Take 1 tablet (500 mg total) by mouth 2 (two) times daily. 20 tablet 0  . olopatadine (PATANOL) 0.1 % ophthalmic solution Place 1 drop into both eyes daily.     . Oxycodone HCl 10 MG TABS Take 10 mg by mouth 4 (four) times daily as needed (for pain).     . pravastatin (PRAVACHOL) 10 MG tablet Take  10 mg by mouth daily.      . pregabalin (LYRICA) 150 MG capsule Take 150 mg by mouth 3 (three) times daily.     . pantoprazole (PROTONIX) 40 MG tablet Take 1 tablet (40 mg total) by mouth daily. Take 30 minutes before breakfast 90 tablet 3   No current facility-administered medications for this visit.     Allergies as of 06/11/2017 - Review Complete 06/11/2017  Allergen Reaction Noted  . Aspirin Other (See Comments)   . Methadone Other (See Comments)     Family History  Problem Relation Age of Onset  . Colon cancer Brother        in his 68s    Social History   Socioeconomic History  . Marital status: Legally Separated     Spouse name: Not on file  . Number of children: Not on file  . Years of education: Not on file  . Highest education level: Not on file  Social Needs  . Financial resource strain: Not on file  . Food insecurity - worry: Not on file  . Food insecurity - inability: Not on file  . Transportation needs - medical: Not on file  . Transportation needs - non-medical: Not on file  Occupational History  . Not on file  Tobacco Use  . Smoking status: Current Some Day Smoker    Packs/day: 3.00    Types: Cigarettes  . Smokeless tobacco: Never Used  Substance and Sexual Activity  . Alcohol use: Yes    Comment: occasional   . Drug use: No  . Sexual activity: Not on file  Other Topics Concern  . Not on file  Social History Narrative  . Not on file    Review of Systems: Gen:see HPI  CV: Denies chest pain, heart palpitations, peripheral edema, syncope.  Resp: Denies shortness of breath at rest or with exertion. Denies wheezing or cough.  GI: see HPI  GU : Denies urinary burning, urinary frequency, urinary hesitancy MS: +back pain  Derm: Denies rash, itching, dry skin Psych: Denies depression, anxiety, memory loss, and confusion Heme:see HPI   Physical Exam: BP 127/75   Pulse 74   Temp (!) 97.2 F (36.2 C) (Oral)   Ht 6\' 2"  (1.88 m)   Wt 189 lb 3.2 oz (85.8 kg)   BMI 24.29 kg/m  General:   Alert and oriented. Pleasant and cooperative. Well-nourished and well-developed.  Head:  Normocephalic and atraumatic. Eyes:  Without icterus, sclera clear and conjunctiva pink.  Ears:  Normal auditory acuity. Nose:  No deformity, discharge,  or lesions. Mouth:  No deformity or lesions, oral mucosa pink.  Lungs:  Clear to auscultation bilaterally. No wheezes, rales, or rhonchi. No distress.  Heart:  S1, S2 present without murmurs appreciated.  Abdomen:  +BS, soft, TTP periumbilically and non-distended. No guarding or rebound. No masses appreciated. Possible HSM Rectal:  Deferred  Msk:   Symmetrical without gross deformities. Normal posture. Extremities:  Without edema. Neurologic:  Alert and  oriented x4 Psych:  Alert and cooperative. Normal mood and affect.

## 2017-06-11 NOTE — Telephone Encounter (Signed)
Called and informed pt of pre-op appt 07/06/17 at 11:00am. Letter mailed.

## 2017-06-11 NOTE — Patient Instructions (Signed)
Attempted to submit PA for CT abd/pelvis w/contrast via [employer] website. No PA needed. 

## 2017-06-12 ENCOUNTER — Encounter: Payer: Self-pay | Admitting: Gastroenterology

## 2017-06-12 LAB — COMPLETE METABOLIC PANEL WITH GFR
AG RATIO: 1.4 (calc) (ref 1.0–2.5)
ALT: 12 U/L (ref 9–46)
AST: 18 U/L (ref 10–35)
Albumin: 4.5 g/dL (ref 3.6–5.1)
Alkaline phosphatase (APISO): 114 U/L (ref 40–115)
BUN/Creatinine Ratio: 6 (calc) (ref 6–22)
BUN: 9 mg/dL (ref 7–25)
CALCIUM: 10.1 mg/dL (ref 8.6–10.3)
CO2: 28 mmol/L (ref 20–32)
CREATININE: 1.5 mg/dL — AB (ref 0.70–1.33)
Chloride: 106 mmol/L (ref 98–110)
GFR, EST AFRICAN AMERICAN: 60 mL/min/{1.73_m2} (ref >=60)
GFR, EST NON AFRICAN AMERICAN: 52 mL/min/{1.73_m2} — AB (ref >=60)
GLOBULIN: 3.3 g/dL (ref 1.9–3.7)
Glucose, Bld: 88 mg/dL (ref 65–139)
POTASSIUM: 4.4 mmol/L (ref 3.5–5.3)
SODIUM: 140 mmol/L (ref 135–146)
TOTAL PROTEIN: 7.8 g/dL (ref 6.1–8.1)
Total Bilirubin: 0.4 mg/dL (ref 0.2–1.2)

## 2017-06-12 LAB — CBC WITH DIFFERENTIAL/PLATELET
Basophils Absolute: 120 cells/uL (ref 0–200)
Basophils Relative: 1.6 %
EOS ABS: 630 {cells}/uL — AB (ref 15–500)
Eosinophils Relative: 8.4 %
HEMATOCRIT: 38.3 % — AB (ref 38.5–50.0)
Hemoglobin: 13.4 g/dL (ref 13.2–17.1)
Lymphs Abs: 3398 cells/uL (ref 850–3900)
MCH: 30.9 pg (ref 27.0–33.0)
MCHC: 35 g/dL (ref 32.0–36.0)
MCV: 88.5 fL (ref 80.0–100.0)
MPV: 10.2 fL (ref 7.5–12.5)
Monocytes Relative: 6.4 %
Neutro Abs: 2873 cells/uL (ref 1500–7800)
Neutrophils Relative %: 38.3 %
PLATELETS: 293 10*3/uL (ref 140–400)
RBC: 4.33 10*6/uL (ref 4.20–5.80)
RDW: 13.5 % (ref 11.0–15.0)
TOTAL LYMPHOCYTE: 45.3 %
WBC: 7.5 10*3/uL (ref 3.8–10.8)
WBCMIX: 480 {cells}/uL (ref 200–950)

## 2017-06-12 LAB — TSH: TSH: 1.6 mIU/L (ref 0.40–4.50)

## 2017-06-12 NOTE — Progress Notes (Signed)
Labs reviewed, which were requested at office visit. Hgb 13.4. Mildly elevated absolute eosinophils. Creatinine 1.50, which is similar to several months ago. LFTs and TSH normal. Awaiting CT scan.

## 2017-06-14 ENCOUNTER — Encounter: Payer: Self-pay | Admitting: Gastroenterology

## 2017-06-14 NOTE — Assessment & Plan Note (Addendum)
Intermittent fecal incontinence. Upon review of chart, he was noting this in 2012 as well. Last colonoscopy 2005 with internal hemorrhoids, otherwise normal. Family history significant for colon cancer with his brother diagnosed in his 63s. Patient is declining colonoscopy despite prolonged conversation regarding high risk for colon cancer and concerning symptoms of incontinence and weight loss. He is still declining but will let us know if he changes his mind. Return in 3 months. Labs ordered today (CBC, CMP, TSH).   ADDENDUM: Patient now willing to pursue colonoscopy. Risks and benefits discussed with stated understanding. Proceed with TCS utilizing Propofol with Dr. Gala Romney in near future: the risks, benefits, and alternatives have been discussed with the patient in detail. The patient states understanding and desires to proceed.

## 2017-06-14 NOTE — Assessment & Plan Note (Signed)
57 year old male last seen in 2012 with history of multisystem sarcoidosis with hepatic involvement (liver biopsy 2006). Notes periumbilical pain, N/V for several months, always present. Self-reports weight loss, no dysphagia. Currently not on a PPI. Last EGD in remote past (2005). Reported weight loss is concerning. Recommend EGD in near future. Also ordering CT scan due to tenderness on exam, weight loss.   Proceed with upper endoscopy in the near future with Dr. Gala Romney. The risks, benefits, and alternatives have been discussed in detail with patient. They have stated understanding and desire to proceed.  PROPOFOL due to polypharmacy CT abd/pelvis in near future CBC, CMP, TSH update Return in 3 months

## 2017-06-15 NOTE — Progress Notes (Signed)
No pcp per patient 

## 2017-06-21 ENCOUNTER — Ambulatory Visit (HOSPITAL_COMMUNITY): Payer: Medicare Other

## 2017-06-25 ENCOUNTER — Telehealth: Payer: Self-pay | Admitting: Internal Medicine

## 2017-06-25 ENCOUNTER — Other Ambulatory Visit: Payer: Self-pay

## 2017-06-25 DIAGNOSIS — Z800 Family history of malignant neoplasm of digestive organs: Secondary | ICD-10-CM

## 2017-06-25 DIAGNOSIS — R112 Nausea with vomiting, unspecified: Secondary | ICD-10-CM

## 2017-06-25 DIAGNOSIS — Z0000 Encounter for general adult medical examination without abnormal findings: Secondary | ICD-10-CM

## 2017-06-25 DIAGNOSIS — R109 Unspecified abdominal pain: Secondary | ICD-10-CM

## 2017-06-25 MED ORDER — PEG 3350-KCL-NA BICARB-NACL 420 G PO SOLR: 4000.0000 mL | ORAL | 0 refills | Status: DC

## 2017-06-25 NOTE — Telephone Encounter (Signed)
Called pt. He can have CT tomorrow. New stat order entered per central scheduling. CT rescheduled to tomorrow at 12:00pm, pt to arrive at 11:45am. Drink 1st bottle of contrast at 10:00am and 2nd bottle at 11:00am. Called and informed pt. States he will try to drink the contrast. Informed his referral would be sent to Dr. Francesca Oman office to establish care with PCP.

## 2017-06-25 NOTE — Telephone Encounter (Signed)
Spoke with pt. Pt takes Lyrica and pain medication daily. Pt feels his body is shutting down and he is very concerned. Pt is in a lot of pain and states he has to continue taking his pain medication and without the Lyrica, it makes his weight drop. Pt had tow accidents in his bed. Pt didn't even know he urinated in his bed until he woke up.   Pt was asked to call the doctors back that gave him these medication to see if he could be evaluated. He doesn't have a PCP and still wants to discuss with AB. Pt is aware that this message will be given to AB and this isn't something our office takes care of.

## 2017-06-25 NOTE — Telephone Encounter (Signed)
He needs a CT scan THIS WEEK. I see it is scheduled for 3/27. We need to do it this week, stat if needed. I continue to strongly suggest colonoscopy at time of EGD due to his family history of colon cancer, but he declined this at last visit. Needs CT this week.   We need to help arrange referral for a PCP. Routing to [facility] clinical pool to arrange CT.

## 2017-06-25 NOTE — Telephone Encounter (Signed)
Called pt. TCS will be added to EGD that was scheduled for 07/12/17 at 10:45am. Rx for prep sent to pharmacy. He is aware new instructions are being mailed. Called and informed Endo scheduler to cancel previous orders. New orders entered for TCS/EGD w/Propofol with RMR.

## 2017-06-25 NOTE — Telephone Encounter (Signed)
Routing to AB as FYI.

## 2017-06-25 NOTE — Telephone Encounter (Signed)
Patient called asking to speak to someone about his medications. He said that he took all his medicine and wet the bed. He said his body is shutting down and doesn't know what to do.  [phone]

## 2017-06-25 NOTE — Telephone Encounter (Signed)
Pt is aware. Pt would like a family Dr. Close to him. [facility]. Pt is also open to doing colonoscopy.

## 2017-06-26 ENCOUNTER — Ambulatory Visit (HOSPITAL_COMMUNITY)
Admission: RE | Admit: 2017-06-26 | Discharge: 2017-06-26 | Disposition: A | Discharge status: Home / Self Care | Payer: Medicare Other | Source: Ambulatory Visit | Attending: Gastroenterology | Admitting: Gastroenterology

## 2017-06-26 DIAGNOSIS — M47816 Spondylosis without myelopathy or radiculopathy, lumbar region: Secondary | ICD-10-CM | POA: Diagnosis not present

## 2017-06-26 DIAGNOSIS — R109 Unspecified abdominal pain: Secondary | ICD-10-CM

## 2017-06-26 DIAGNOSIS — Z96643 Presence of artificial hip joint, bilateral: Secondary | ICD-10-CM | POA: Diagnosis not present

## 2017-06-26 DIAGNOSIS — K760 Fatty (change of) liver, not elsewhere classified: Secondary | ICD-10-CM | POA: Insufficient documentation

## 2017-06-26 DIAGNOSIS — K429 Umbilical hernia without obstruction or gangrene: Secondary | ICD-10-CM | POA: Insufficient documentation

## 2017-06-26 MED ORDER — IOPAMIDOL (ISOVUE-300) INJECTION 61%
100.0000 mL | Freq: Once | INTRAVENOUS | Status: AC | PRN
  Administered 2017-06-26: 100 mL via INTRAVENOUS

## 2017-06-27 NOTE — Progress Notes (Signed)
CT without acute abnormality. Fatty liver. Continue with plans for TCS/EGD.

## 2017-07-03 NOTE — Patient Instructions (Signed)
Michael [name]  07/03/2017     @PREFPERIOPPHARMACY @   Your procedure is scheduled on 07/12/2017.  Report to [facility] at 8:45 A.M.  Call this number if you have problems the morning of surgery:  [phone]   Remember:  Do not eat food or drink liquids after midnight.  Take these medicines the morning of surgery with A SIP OF WATER Albuterol neb, Abilify, Flonase, Oxycodone if needed, Lyrica, Zanaflex   Do not wear jewelry, make-up or nail polish.  Do not wear lotions, powders, or perfumes, or deodorant.  Do not shave 48 hours prior to surgery.  Men may shave face and neck.  Do not bring valuables to the hospital.  [facility] is not responsible for any belongings or valuables.  Contacts, dentures or bridgework may not be worn into surgery.  Leave your suitcase in the car.  After surgery it may be brought to your room.  For patients admitted to the hospital, discharge time will be determined by your treatment team.  Patients discharged the day of surgery will not be allowed to drive home.   Please read over the following fact sheets that you were given. Anesthesia Post-op Instructions     PATIENT INSTRUCTIONS POST-ANESTHESIA  IMMEDIATELY FOLLOWING SURGERY:  Do not drive or operate machinery for the first twenty four hours after surgery.  Do not make any important decisions for twenty four hours after surgery or while taking narcotic pain medications or sedatives.  If you develop intractable nausea and vomiting or a severe headache please notify your doctor immediately.  FOLLOW-UP:  Please make an appointment with your surgeon as instructed. You do not need to follow up with anesthesia unless specifically instructed to do so.  WOUND CARE INSTRUCTIONS (if applicable):  Keep a dry clean dressing on the anesthesia/puncture wound site if there is drainage.  Once the wound has quit draining you may leave it open to air.  Generally you should leave the bandage intact for twenty four  hours unless there is drainage.  If the epidural site drains for more than 36-48 hours please call the anesthesia department.  QUESTIONS?:  Please feel free to call your physician or the hospital operator if you have any questions, and they will be happy to assist you.      Esophagogastroduodenoscopy Esophagogastroduodenoscopy (EGD) is a procedure to examine the lining of the esophagus, stomach, and first part of the small intestine (duodenum). This procedure is done to check for problems such as inflammation, bleeding, ulcers, or growths. During this procedure, a long, flexible, lighted tube with a camera attached (endoscope) is inserted down the throat. Tell a health care provider about:  Any allergies you have.  All medicines you are taking, including vitamins, herbs, eye drops, creams, and over-the-counter medicines.  Any problems you or family members have had with anesthetic medicines.  Any blood disorders you have.  Any surgeries you have had.  Any medical conditions you have.  Whether you are pregnant or may be pregnant. What are the risks? Generally, this is a safe procedure. However, problems may occur, including:  Infection.  Bleeding.  A tear (perforation) in the esophagus, stomach, or duodenum.  Trouble breathing.  Excessive sweating.  Spasms of the larynx.  A slowed heartbeat.  Low blood pressure.  What happens before the procedure?  Follow instructions from your health care provider about eating or drinking restrictions.  Ask your health care provider about: ? Changing or stopping your regular medicines. This is  especially important if you are taking diabetes medicines or blood thinners. ? Taking medicines such as aspirin and ibuprofen. These medicines can thin your blood. Do not take these medicines before your procedure if your health care provider instructs you not to.  Plan to have someone take you home after the procedure.  If you wear dentures,  be ready to remove them before the procedure. What happens during the procedure?  To reduce your risk of infection, your health care team will wash or sanitize their hands.  An IV tube will be put in a vein in your hand or arm. You will get medicines and fluids through this tube.  You will be given one or more of the following: ? A medicine to help you relax (sedative). ? A medicine to numb the area (local anesthetic). This medicine may be sprayed into your throat. It will make you feel more comfortable and keep you from gagging or coughing during the procedure. ? A medicine for pain.  A mouth guard may be placed in your mouth to protect your teeth and to keep you from biting on the endoscope.  You will be asked to lie on your left side.  The endoscope will be lowered down your throat into your esophagus, stomach, and duodenum.  Air will be put into the endoscope. This will help your health care provider see better.  The lining of your esophagus, stomach, and duodenum will be examined.  Your health care provider may: ? Take a tissue sample so it can be looked at in a lab (biopsy). ? Remove growths. ? Remove objects (foreign bodies) that are stuck. ? Treat any bleeding with medicines or other devices that stop tissue from bleeding. ? Widen (dilate) or stretch narrowed areas of your esophagus and stomach.  The endoscope will be taken out. The procedure may vary among health care providers and hospitals. What happens after the procedure?  Your blood pressure, heart rate, breathing rate, and blood oxygen level will be monitored often until the medicines you were given have worn off.  Do not eat or drink anything until the numbing medicine has worn off and your gag reflex has returned. This information is not intended to replace advice given to you by your health care provider. Make sure you discuss any questions you have with your health care provider. Document Released: 07/28/2004  Document Revised: 09/02/2015 Document Reviewed: 02/18/2015 Elsevier Interactive Patient Education  2018 [employer]. Colonoscopy, Adult A colonoscopy is an exam to look at the entire large intestine. During the exam, a lubricated, bendable tube is inserted into the anus and then passed into the rectum, colon, and other parts of the large intestine. A colonoscopy is often done as a part of normal colorectal screening or in response to certain symptoms, such as anemia, persistent diarrhea, abdominal pain, and blood in the stool. The exam can help screen for and diagnose medical problems, including:  Tumors.  Polyps.  Inflammation.  Areas of bleeding.  Tell a health care provider about:  Any allergies you have.  All medicines you are taking, including vitamins, herbs, eye drops, creams, and over-the-counter medicines.  Any problems you or family members have had with anesthetic medicines.  Any blood disorders you have.  Any surgeries you have had.  Any medical conditions you have.  Any problems you have had passing stool. What are the risks? Generally, this is a safe procedure. However, problems may occur, including:  Bleeding.  A tear in the intestine.  A reaction to medicines given during the exam.  Infection (rare).  What happens before the procedure? Eating and drinking restrictions Follow instructions from your health care provider about eating and drinking, which may include:  A few days before the procedure - follow a low-fiber diet. Avoid nuts, seeds, dried fruit, raw fruits, and vegetables.  1-3 days before the procedure - follow a clear liquid diet. Drink only clear liquids, such as clear broth or bouillon, black coffee or tea, clear juice, clear soft drinks or sports drinks, gelatin dessert, and popsicles. Avoid any liquids that contain red or purple dye.  On the day of the procedure - do not eat or drink anything during the 2 hours before the procedure, or  within the time period that your health care provider recommends.  Bowel prep If you were prescribed an oral bowel prep to clean out your colon:  Take it as told by your health care provider. Starting the day before your procedure, you will need to drink a large amount of medicated liquid. The liquid will cause you to have multiple loose stools until your stool is almost clear or light green.  If your skin or anus gets irritated from diarrhea, you may use these to relieve the irritation: ? Medicated wipes, such as adult wet wipes with aloe and vitamin E. ? A skin soothing-product like petroleum jelly.  If you vomit while drinking the bowel prep, take a break for up to 60 minutes and then begin the bowel prep again. If vomiting continues and you cannot take the bowel prep without vomiting, call your health care provider.  General instructions  Ask your health care provider about changing or stopping your regular medicines. This is especially important if you are taking diabetes medicines or blood thinners.  Plan to have someone take you home from the hospital or clinic. What happens during the procedure?  An IV tube may be inserted into one of your veins.  You will be given medicine to help you relax (sedative).  To reduce your risk of infection: ? Your health care team will wash or sanitize their hands. ? Your anal area will be washed with soap.  You will be asked to lie on your side with your knees bent.  Your health care provider will lubricate a long, thin, flexible tube. The tube will have a camera and a light on the end.  The tube will be inserted into your anus.  The tube will be gently eased through your rectum and colon.  Air will be delivered into your colon to keep it open. You may feel some pressure or cramping.  The camera will be used to take images during the procedure.  A small tissue sample may be removed from your body to be examined under a microscope  (biopsy). If any potential problems are found, the tissue will be sent to a lab for testing.  If small polyps are found, your health care provider may remove them and have them checked for cancer cells.  The tube that was inserted into your anus will be slowly removed. The procedure may vary among health care providers and hospitals. What happens after the procedure?  Your blood pressure, heart rate, breathing rate, and blood oxygen level will be monitored until the medicines you were given have worn off.  Do not drive for 24 hours after the exam.  You may have a small amount of blood in your stool.  You may pass gas and have  mild abdominal cramping or bloating due to the air that was used to inflate your colon during the exam.  It is up to you to get the results of your procedure. Ask your health care provider, or the department performing the procedure, when your results will be ready. This information is not intended to replace advice given to you by your health care provider. Make sure you discuss any questions you have with your health care provider. Document Released: 03/24/2000 Document Revised: 01/26/2016 Document Reviewed: 06/08/2015 Elsevier Interactive Patient Education  2018 [employer].

## 2017-07-04 ENCOUNTER — Other Ambulatory Visit (HOSPITAL_COMMUNITY): Payer: Medicare Other

## 2017-07-05 DIAGNOSIS — I10 Essential (primary) hypertension: Secondary | ICD-10-CM | POA: Diagnosis not present

## 2017-07-06 ENCOUNTER — Other Ambulatory Visit (HOSPITAL_COMMUNITY): Payer: Medicare Other

## 2017-07-06 ENCOUNTER — Encounter (HOSPITAL_COMMUNITY)
Admission: RE | Admit: 2017-07-06 | Discharge: 2017-07-06 | Disposition: A | Discharge status: Home / Self Care | Payer: Medicare Other | Source: Ambulatory Visit | Attending: Internal Medicine | Admitting: Internal Medicine

## 2017-07-06 ENCOUNTER — Other Ambulatory Visit: Payer: Self-pay

## 2017-07-06 LAB — CBC WITH DIFFERENTIAL/PLATELET
Basophils Absolute: 0 10*3/uL (ref 0.0–0.1)
Basophils Relative: 0 %
EOS PCT: 3 %
Eosinophils Absolute: 0.2 10*3/uL (ref 0.0–0.7)
HEMATOCRIT: 42.7 % (ref 39.0–52.0)
Hemoglobin: 13.7 g/dL (ref 13.0–17.0)
LYMPHS ABS: 1.4 10*3/uL (ref 0.7–4.0)
LYMPHS PCT: 18 %
MCH: 32.1 pg (ref 26.0–34.0)
MCHC: 32.1 g/dL (ref 30.0–36.0)
MCV: 100 fL (ref 78.0–100.0)
MONO ABS: 0.9 10*3/uL (ref 0.1–1.0)
Monocytes Relative: 11 %
Neutro Abs: 5.3 10*3/uL (ref 1.7–7.7)
Neutrophils Relative %: 68 %
PLATELETS: 175 10*3/uL (ref 150–400)
RBC: 4.27 MIL/uL (ref 4.22–5.81)
RDW: 14.4 % (ref 11.5–15.5)
WBC: 7.8 10*3/uL (ref 4.0–10.5)

## 2017-07-06 LAB — BASIC METABOLIC PANEL
Anion gap: 11 (ref 5–15)
BUN: 19 mg/dL (ref 6–20)
CALCIUM: 9.2 mg/dL (ref 8.9–10.3)
CO2: 23 mmol/L (ref 22–32)
Chloride: 106 mmol/L (ref 101–111)
Creatinine, Ser: 1.66 mg/dL — ABNORMAL HIGH (ref 0.61–1.24)
GFR calc Af Amer: 52 mL/min — ABNORMAL LOW (ref >60)
GFR, EST NON AFRICAN AMERICAN: 45 mL/min — AB (ref >60)
GLUCOSE: 108 mg/dL — AB (ref 65–99)
Potassium: 4.6 mmol/L (ref 3.5–5.1)
Sodium: 140 mmol/L (ref 135–145)

## 2017-07-09 ENCOUNTER — Encounter (HOSPITAL_COMMUNITY): Admission: RE | Admit: 2017-07-09 | Payer: Medicare Other | Source: Ambulatory Visit

## 2017-07-11 ENCOUNTER — Encounter (HOSPITAL_COMMUNITY)
Admission: RE | Admit: 2017-07-11 | Discharge: 2017-07-11 | Disposition: A | Discharge status: Home / Self Care | Payer: Medicare Other | Source: Ambulatory Visit | Attending: Internal Medicine | Admitting: Internal Medicine

## 2017-07-12 ENCOUNTER — Encounter (HOSPITAL_COMMUNITY): Payer: Self-pay

## 2017-07-12 ENCOUNTER — Telehealth: Payer: Self-pay | Admitting: *Deleted

## 2017-07-12 ENCOUNTER — Ambulatory Visit (HOSPITAL_COMMUNITY): Admit: 2017-07-12 | Payer: Medicare Other | Admitting: Internal Medicine

## 2017-07-12 SURGERY — ESOPHAGOGASTRODUODENOSCOPY (EGD) WITH PROPOFOL
Anesthesia: Monitor Anesthesia Care

## 2017-07-12 NOTE — Telephone Encounter (Signed)
Called spoke with pt. He is scheduled for procedure today and states he does not have a ride and he also did not start his prep. Patient r/s'd to 08/23/17 at 10:00am. New instructions mailed. Hoyle Sauer aware of new date/time

## 2017-07-16 NOTE — Telephone Encounter (Signed)
Spoke with Michael [name] and pre-op will do phone call. Patient aware

## 2017-08-21 ENCOUNTER — Telehealth: Payer: Self-pay | Admitting: *Deleted

## 2017-08-21 ENCOUNTER — Encounter (HOSPITAL_COMMUNITY)
Admission: RE | Admit: 2017-08-21 | Discharge: 2017-08-21 | Disposition: A | Discharge status: Home / Self Care | Payer: Medicare Other | Source: Ambulatory Visit | Attending: Internal Medicine | Admitting: Internal Medicine

## 2017-08-21 ENCOUNTER — Telehealth: Payer: Self-pay

## 2017-08-21 NOTE — Telephone Encounter (Signed)
At last OV we had referred patient to [facility]. They are no longer accepting new patients.

## 2017-08-21 NOTE — Telephone Encounter (Signed)
Please refer to a primary care who is accepting in this area. I am not sure who that may be?

## 2017-08-21 NOTE — Telephone Encounter (Signed)
Endo scheduler called office. Pre-op nurse called pt this morning and he told her he was going to have procedure now. He was scheduled for tcs/egd w/Propofol 08/23/17. Called pt, he states he has a lot going on right now and isn't able to do it. He's aware he will need OV to reschedule. LMOVM and informed endo scheduler to cancel procedure.  According to pt's appt desk, he has OV 09/11/17.

## 2017-08-21 NOTE — Telephone Encounter (Signed)
Called spoke with pt and advised the PCP we were referring too is not accepting new patients. I attempted to advise him of the number he can call with [facility] to set this up and he stated" I have been trying to get a doctor for 2 years now and if I get one then I will get one and if not don't worry about it" and the call ended. FYI to AB

## 2017-08-21 NOTE — Telephone Encounter (Signed)
Patient can call [facility] at [phone] and they will help patient find a PCP.   Called patient, received VM. VM is not set up.

## 2017-08-23 ENCOUNTER — Encounter (HOSPITAL_COMMUNITY): Admission: RE | Payer: Self-pay | Source: Ambulatory Visit

## 2017-08-23 ENCOUNTER — Ambulatory Visit (HOSPITAL_COMMUNITY): Admission: RE | Admit: 2017-08-23 | Payer: Medicare Other | Source: Ambulatory Visit | Admitting: Internal Medicine

## 2017-08-23 SURGERY — COLONOSCOPY WITH PROPOFOL
Anesthesia: Monitor Anesthesia Care

## 2017-09-11 ENCOUNTER — Ambulatory Visit: Payer: Medicare Other | Admitting: Gastroenterology

## 2017-09-11 ENCOUNTER — Telehealth: Payer: Self-pay | Admitting: Gastroenterology

## 2017-09-11 ENCOUNTER — Encounter: Payer: Self-pay | Admitting: Gastroenterology

## 2017-09-11 NOTE — Telephone Encounter (Signed)
PATIENT WAS A NO SHOW AND LETTER SENT  °

## 2017-09-26 DIAGNOSIS — M545 Low back pain: Secondary | ICD-10-CM | POA: Diagnosis not present

## 2017-09-26 DIAGNOSIS — M5416 Radiculopathy, lumbar region: Secondary | ICD-10-CM | POA: Diagnosis not present

## 2017-09-26 DIAGNOSIS — R030 Elevated blood-pressure reading, without diagnosis of hypertension: Secondary | ICD-10-CM | POA: Diagnosis not present

## 2017-10-24 NOTE — H&P (Signed)
HISTORY AND PHYSICAL  Michael [name] is a 57 y.o. male patient  Referred by general dentist for removal all remaining teeth.  No diagnosis found.  Past Medical History:  Diagnosis Date  . Allergic rhinitis   . Arthritis   . Chronic low back pain    follwed in Pain Clinic(Dr.Kirchmayer)  . Depression with anxiety   . GERD (gastroesophageal reflux disease)   . Insomnia   . Migraines   . Sarcoidosis    Multisystem,pulmonary and hepatic (Dr.Rourk)Liver bx in 06 mildly postive AMA but on re-check normal    No current facility-administered medications for this encounter.    Current Outpatient Medications  Medication Sig Dispense Refill  . albuterol (PROVENTIL) (2.5 MG/3ML) 0.083% nebulizer solution Take 2.5 mg by nebulization every 6 (six) hours as needed for wheezing or shortness of breath (scheduled at bedtime).     . Aspirin-Acetaminophen-Caffeine (GOODY HEADACHE PO) Take 1 packet by mouth daily as needed (headaches).    . fluticasone (FLONASE) 50 MCG/ACT nasal spray Place 2 sprays into the nose daily as needed (for seasonal allergies).     . Menthol-Methyl Salicylate (MUSCLE RUB) 10-15 % CREA Apply 1 application topically as needed for muscle pain.    Marland Kitchen olopatadine (PATANOL) 0.1 % ophthalmic solution Place 1 drop into both eyes daily as needed (for watery eyes.).     Marland Kitchen Oxycodone HCl 10 MG TABS Take 10 mg by mouth 4 (four) times daily.     . pregabalin (LYRICA) 150 MG capsule Take 150 mg by mouth 3 (three) times daily.     Marland Kitchen tiZANidine (ZANAFLEX) 4 MG tablet Take 4 mg by mouth 2 (two) times daily.   2  . XTAMPZA ER 9 MG C12A Take 9 mg by mouth daily.   0  . pantoprazole (PROTONIX) 40 MG tablet Take 1 tablet (40 mg total) by mouth daily. Take 30 minutes before breakfast (Patient not taking: Reported on 10/24/2017) 90 tablet 3  . polyethylene glycol-electrolytes (TRILYTE) 420 g solution Take 4,000 mLs by mouth as directed. (Patient not taking: Reported on 10/24/2017) 4000 mL 0   Allergies   Allergen Reactions  . Aspirin Other (See Comments)    REACTION: "Reacts with sarcoidosis"  . Methadone Other (See Comments)    REACTION: "Become mentally unstable"   Active Problems:   * No active hospital problems. *  Vitals: There were no vitals taken for this visit. Lab results:No results found for this or any previous visit (from the past 23 hour(s)). Radiology Results: No results found. General appearance: alert, cooperative and no distress Head: Normocephalic, without obvious abnormality, atraumatic Eyes: negative Nose: Nares normal. Septum midline. Mucosa normal. No drainage or sinus tenderness. Throat: multiple dental caries, retained roots, impacted tooth #6; pharynx clear, no trismus Neck: no adenopathy, supple, symmetrical, trachea midline and thyroid not enlarged, symmetric, no tenderness/mass/nodules Resp: clear to auscultation bilaterally Cardio: regular rate and rhythm, S1, S2 normal, no murmur, click, rub or gallop  Assessment: Nonrestorable teeth secondary to dental caries. Impacted tooth #6  Plan: Full mouth extractions. Alveoloplasty.  GA. Day surgery.   Diona Browner 10/24/2017

## 2017-10-26 ENCOUNTER — Ambulatory Visit (HOSPITAL_COMMUNITY): Admission: RE | Admit: 2017-10-26 | Payer: Medicaid Other | Source: Ambulatory Visit | Admitting: Oral Surgery

## 2017-10-26 ENCOUNTER — Encounter (HOSPITAL_COMMUNITY): Admission: RE | Payer: Self-pay | Source: Ambulatory Visit

## 2017-10-26 SURGERY — DENTAL RESTORATION/EXTRACTIONS
Anesthesia: General | Laterality: Bilateral

## 2017-12-27 ENCOUNTER — Encounter (HOSPITAL_COMMUNITY): Payer: Self-pay | Admitting: *Deleted

## 2017-12-27 ENCOUNTER — Other Ambulatory Visit: Payer: Self-pay

## 2017-12-27 DIAGNOSIS — I10 Essential (primary) hypertension: Secondary | ICD-10-CM | POA: Diagnosis not present

## 2017-12-27 DIAGNOSIS — M545 Low back pain: Secondary | ICD-10-CM | POA: Diagnosis not present

## 2017-12-27 DIAGNOSIS — M5416 Radiculopathy, lumbar region: Secondary | ICD-10-CM | POA: Diagnosis not present

## 2017-12-27 NOTE — Anesthesia Preprocedure Evaluation (Addendum)
Anesthesia Evaluation  Patient identified by MRN, date of birth, ID band Patient awake    Reviewed: Allergy & Precautions, NPO status , Patient's Chart, lab work & pertinent test results  Airway Mallampati: II  TM Distance: >3 FB Neck ROM: Full    Dental no notable dental hx. (+) Poor Dentition   Pulmonary Current Smoker,  Hx of sarcoidosis   Pulmonary exam normal breath sounds clear to auscultation       Cardiovascular negative cardio ROS Normal cardiovascular exam Rhythm:Regular Rate:Normal     Neuro/Psych  Headaches,    GI/Hepatic GERD  ,  Endo/Other    Renal/GU      Musculoskeletal  (+) Arthritis ,   Abdominal   Peds  Hematology   Anesthesia Other Findings Hx of chronic pain  Reproductive/Obstetrics                            Lab Results  Component Value Date   WBC 7.8 07/06/2017   HGB 13.7 07/06/2017   HCT 42.7 07/06/2017   MCV 100.0 07/06/2017   PLT 175 07/06/2017    Anesthesia Physical Anesthesia Plan  ASA: II  Anesthesia Plan: General   Post-op Pain Management:    Induction: Intravenous  PONV Risk Score and Plan: Treatment may vary due to age or medical condition  Airway Management Planned: Nasal ETT and Oral ETT  Additional Equipment:   Intra-op Plan:   Post-operative Plan: Extubation in OR  Informed Consent: I have reviewed the patients History and Physical, chart, labs and discussed the procedure including the risks, benefits and alternatives for the proposed anesthesia with the patient or authorized representative who has indicated his/her understanding and acceptance.   Dental advisory given  Plan Discussed with:   Anesthesia Plan Comments: (Plus dexa /k)        Anesthesia Quick Evaluation

## 2017-12-27 NOTE — Progress Notes (Signed)
Mr Litsey reports that his PCP died and he has not found a new one.  Patient is followed by Dr Luan Pulling at the pain clinic.

## 2017-12-28 ENCOUNTER — Encounter (HOSPITAL_COMMUNITY)
Admission: RE | Disposition: A | Discharge status: Home / Self Care | Payer: Self-pay | Source: Ambulatory Visit | Attending: Oral Surgery

## 2017-12-28 ENCOUNTER — Encounter (HOSPITAL_COMMUNITY): Payer: Self-pay | Admitting: *Deleted

## 2017-12-28 ENCOUNTER — Ambulatory Visit (HOSPITAL_COMMUNITY): Payer: Medicare Other | Admitting: Certified Registered Nurse Anesthetist

## 2017-12-28 ENCOUNTER — Ambulatory Visit (HOSPITAL_COMMUNITY)
Admission: RE | Admit: 2017-12-28 | Discharge: 2017-12-28 | Disposition: A | Discharge status: Home / Self Care | Payer: Medicare Other | Source: Ambulatory Visit | Attending: Oral Surgery | Admitting: Oral Surgery

## 2017-12-28 DIAGNOSIS — Z87442 Personal history of urinary calculi: Secondary | ICD-10-CM | POA: Diagnosis not present

## 2017-12-28 DIAGNOSIS — K219 Gastro-esophageal reflux disease without esophagitis: Secondary | ICD-10-CM | POA: Insufficient documentation

## 2017-12-28 DIAGNOSIS — K029 Dental caries, unspecified: Secondary | ICD-10-CM | POA: Insufficient documentation

## 2017-12-28 DIAGNOSIS — K011 Impacted teeth: Secondary | ICD-10-CM | POA: Diagnosis not present

## 2017-12-28 DIAGNOSIS — M1990 Unspecified osteoarthritis, unspecified site: Secondary | ICD-10-CM | POA: Diagnosis not present

## 2017-12-28 DIAGNOSIS — G4700 Insomnia, unspecified: Secondary | ICD-10-CM | POA: Diagnosis not present

## 2017-12-28 DIAGNOSIS — F418 Other specified anxiety disorders: Secondary | ICD-10-CM | POA: Insufficient documentation

## 2017-12-28 DIAGNOSIS — Z79899 Other long term (current) drug therapy: Secondary | ICD-10-CM | POA: Insufficient documentation

## 2017-12-28 HISTORY — DX: Personal history of urinary calculi: Z87.442

## 2017-12-28 HISTORY — DX: Major depressive disorder, single episode, unspecified: F32.9

## 2017-12-28 HISTORY — PX: TOOTH EXTRACTION: SHX859

## 2017-12-28 HISTORY — DX: Depression, unspecified: F32.A

## 2017-12-28 LAB — CBC
HEMATOCRIT: 39.8 % (ref 39.0–52.0)
HEMOGLOBIN: 13 g/dL (ref 13.0–17.0)
MCH: 31.2 pg (ref 26.0–34.0)
MCHC: 32.7 g/dL (ref 30.0–36.0)
MCV: 95.4 fL (ref 78.0–100.0)
Platelets: 280 10*3/uL (ref 150–400)
RBC: 4.17 MIL/uL — AB (ref 4.22–5.81)
RDW: 14.5 % (ref 11.5–15.5)
WBC: 9.7 10*3/uL (ref 4.0–10.5)

## 2017-12-28 SURGERY — DENTAL RESTORATION/EXTRACTIONS
Anesthesia: General | Laterality: Bilateral

## 2017-12-28 MED ORDER — SUCCINYLCHOLINE CHLORIDE 200 MG/10ML IV SOSY
PREFILLED_SYRINGE | INTRAVENOUS | Status: AC
  Filled 2017-12-28: qty 10

## 2017-12-28 MED ORDER — SUGAMMADEX SODIUM 200 MG/2ML IV SOLN
INTRAVENOUS | Status: DC | PRN
  Administered 2017-12-28: 200 mg via INTRAVENOUS

## 2017-12-28 MED ORDER — LIDOCAINE-EPINEPHRINE 2 %-1:100000 IJ SOLN
INTRAMUSCULAR | Status: DC | PRN
  Administered 2017-12-28: 20 mL via INTRADERMAL

## 2017-12-28 MED ORDER — PROMETHAZINE HCL 25 MG/ML IJ SOLN: 6.2500 mg | INTRAMUSCULAR | Status: DC | PRN

## 2017-12-28 MED ORDER — 0.9 % SODIUM CHLORIDE (POUR BTL) OPTIME
TOPICAL | Status: DC | PRN
  Administered 2017-12-28: 1000 mL

## 2017-12-28 MED ORDER — LIDOCAINE-EPINEPHRINE 2 %-1:100000 IJ SOLN
INTRAMUSCULAR | Status: AC
  Filled 2017-12-28: qty 1

## 2017-12-28 MED ORDER — EPHEDRINE 5 MG/ML INJ
INTRAVENOUS | Status: AC
  Filled 2017-12-28: qty 10

## 2017-12-28 MED ORDER — LIDOCAINE HCL (CARDIAC) PF 100 MG/5ML IV SOSY
PREFILLED_SYRINGE | INTRAVENOUS | Status: DC | PRN
  Administered 2017-12-28: 100 mg via INTRAVENOUS

## 2017-12-28 MED ORDER — PHENYLEPHRINE HCL 10 MG/ML IJ SOLN
INTRAMUSCULAR | Status: DC | PRN
  Administered 2017-12-28: 200 ug via INTRAVENOUS

## 2017-12-28 MED ORDER — FENTANYL CITRATE (PF) 250 MCG/5ML IJ SOLN
INTRAMUSCULAR | Status: AC
  Filled 2017-12-28: qty 5

## 2017-12-28 MED ORDER — FENTANYL CITRATE (PF) 250 MCG/5ML IJ SOLN
INTRAMUSCULAR | Status: DC | PRN
  Administered 2017-12-28: 100 ug via INTRAVENOUS
  Administered 2017-12-28: 50 ug via INTRAVENOUS

## 2017-12-28 MED ORDER — ONDANSETRON HCL 4 MG/2ML IJ SOLN
INTRAMUSCULAR | Status: DC | PRN
  Administered 2017-12-28: 4 mg via INTRAVENOUS

## 2017-12-28 MED ORDER — OXYMETAZOLINE HCL 0.05 % NA SOLN
NASAL | Status: DC | PRN
  Administered 2017-12-28: 1 via TOPICAL

## 2017-12-28 MED ORDER — DEXAMETHASONE SODIUM PHOSPHATE 10 MG/ML IJ SOLN
INTRAMUSCULAR | Status: DC | PRN
  Administered 2017-12-28: 10 mg via INTRAVENOUS

## 2017-12-28 MED ORDER — HYDROCODONE-ACETAMINOPHEN 7.5-325 MG PO TABS: 1.0000 | ORAL_TABLET | Freq: Once | ORAL | Status: DC | PRN

## 2017-12-28 MED ORDER — LIDOCAINE 2% (20 MG/ML) 5 ML SYRINGE
INTRAMUSCULAR | Status: AC
  Filled 2017-12-28: qty 5

## 2017-12-28 MED ORDER — CEFAZOLIN SODIUM-DEXTROSE 2-4 GM/100ML-% IV SOLN
2.0000 g | INTRAVENOUS | Status: AC
  Administered 2017-12-28: 2 g via INTRAVENOUS
  Filled 2017-12-28: qty 100

## 2017-12-28 MED ORDER — PROPOFOL 10 MG/ML IV BOLUS
INTRAVENOUS | Status: AC
  Filled 2017-12-28: qty 20

## 2017-12-28 MED ORDER — ONDANSETRON HCL 4 MG/2ML IJ SOLN
INTRAMUSCULAR | Status: AC
  Filled 2017-12-28: qty 2

## 2017-12-28 MED ORDER — TRAMADOL HCL 50 MG PO TABS: 50.0000 mg | ORAL_TABLET | Freq: Four times a day (QID) | ORAL | 0 refills | Status: AC | PRN

## 2017-12-28 MED ORDER — OXYMETAZOLINE HCL 0.05 % NA SOLN
NASAL | Status: AC
  Filled 2017-12-28: qty 15

## 2017-12-28 MED ORDER — GABAPENTIN 300 MG PO CAPS
300.0000 mg | ORAL_CAPSULE | Freq: Once | ORAL | Status: AC
  Administered 2017-12-28: 300 mg via ORAL
  Filled 2017-12-28: qty 1

## 2017-12-28 MED ORDER — ROCURONIUM BROMIDE 50 MG/5ML IV SOSY
PREFILLED_SYRINGE | INTRAVENOUS | Status: AC
  Filled 2017-12-28: qty 5

## 2017-12-28 MED ORDER — OXYMETAZOLINE HCL 0.05 % NA SOLN
NASAL | Status: DC | PRN
  Administered 2017-12-28: 2 via NASAL

## 2017-12-28 MED ORDER — HYDROMORPHONE HCL 1 MG/ML IJ SOLN: 0.2500 mg | INTRAMUSCULAR | Status: DC | PRN

## 2017-12-28 MED ORDER — PHENYLEPHRINE 40 MCG/ML (10ML) SYRINGE FOR IV PUSH (FOR BLOOD PRESSURE SUPPORT)
PREFILLED_SYRINGE | INTRAVENOUS | Status: AC
  Filled 2017-12-28: qty 10

## 2017-12-28 MED ORDER — ACETAMINOPHEN 10 MG/ML IV SOLN: 1000.0000 mg | Freq: Once | INTRAVENOUS | Status: DC | PRN

## 2017-12-28 MED ORDER — MIDAZOLAM HCL 2 MG/2ML IJ SOLN
INTRAMUSCULAR | Status: AC
  Filled 2017-12-28: qty 2

## 2017-12-28 MED ORDER — ACETAMINOPHEN 500 MG PO TABS
1000.0000 mg | ORAL_TABLET | Freq: Once | ORAL | Status: AC
  Administered 2017-12-28: 1000 mg via ORAL
  Filled 2017-12-28: qty 2

## 2017-12-28 MED ORDER — MEPERIDINE HCL 50 MG/ML IJ SOLN: 6.2500 mg | INTRAMUSCULAR | Status: DC | PRN

## 2017-12-28 MED ORDER — AMOXICILLIN 500 MG PO CAPS: 500.0000 mg | ORAL_CAPSULE | Freq: Three times a day (TID) | ORAL | 0 refills | Status: DC

## 2017-12-28 MED ORDER — ROCURONIUM BROMIDE 100 MG/10ML IV SOLN
INTRAVENOUS | Status: DC | PRN
  Administered 2017-12-28: 50 mg via INTRAVENOUS

## 2017-12-28 MED ORDER — LACTATED RINGERS IV SOLN
INTRAVENOUS | Status: DC
  Administered 2017-12-28: 07:00:00 via INTRAVENOUS

## 2017-12-28 MED ORDER — ALBUMIN HUMAN 5 % IV SOLN
INTRAVENOUS | Status: DC | PRN
  Administered 2017-12-28: 09:00:00 via INTRAVENOUS

## 2017-12-28 MED ORDER — SODIUM CHLORIDE 0.9 % IR SOLN
Status: DC | PRN
  Administered 2017-12-28: 1000 mL

## 2017-12-28 MED ORDER — DEXAMETHASONE SODIUM PHOSPHATE 10 MG/ML IJ SOLN
INTRAMUSCULAR | Status: AC
  Filled 2017-12-28: qty 2

## 2017-12-28 MED ORDER — MIDAZOLAM HCL 5 MG/5ML IJ SOLN
INTRAMUSCULAR | Status: DC | PRN
  Administered 2017-12-28: 2 mg via INTRAVENOUS

## 2017-12-28 MED ORDER — PROPOFOL 10 MG/ML IV BOLUS
INTRAVENOUS | Status: DC | PRN
  Administered 2017-12-28: 200 mg via INTRAVENOUS

## 2017-12-28 SURGICAL SUPPLY — 34 items
BLADE SURG 15 STRL LF DISP TIS (BLADE) ×1 IMPLANT
BLADE SURG 15 STRL SS (BLADE) ×1
BUR CROSS CUT FISSURE 1.6 (BURR) ×2 IMPLANT
BUR EGG ELITE 4.0 (BURR) ×2 IMPLANT
CANISTER SUCT 3000ML PPV (MISCELLANEOUS) ×2 IMPLANT
COVER SURGICAL LIGHT HANDLE (MISCELLANEOUS) ×2 IMPLANT
DECANTER SPIKE VIAL GLASS SM (MISCELLANEOUS) ×2 IMPLANT
DRAPE U-SHAPE 76X120 STRL (DRAPES) ×2 IMPLANT
GAUZE PACKING FOLDED 2  STR (GAUZE/BANDAGES/DRESSINGS) ×1
GAUZE PACKING FOLDED 2 STR (GAUZE/BANDAGES/DRESSINGS) ×1 IMPLANT
GLOVE BIO SURGEON STRL SZ 6.5 (GLOVE) IMPLANT
GLOVE BIO SURGEON STRL SZ7 (GLOVE) IMPLANT
GLOVE BIO SURGEON STRL SZ7.5 (GLOVE) ×2 IMPLANT
GLOVE BIOGEL PI IND STRL 6.5 (GLOVE) IMPLANT
GLOVE BIOGEL PI IND STRL 7.0 (GLOVE) IMPLANT
GLOVE BIOGEL PI INDICATOR 6.5 (GLOVE)
GLOVE BIOGEL PI INDICATOR 7.0 (GLOVE)
GOWN STRL REUS W/ TWL LRG LVL3 (GOWN DISPOSABLE) ×1 IMPLANT
GOWN STRL REUS W/ TWL XL LVL3 (GOWN DISPOSABLE) ×1 IMPLANT
GOWN STRL REUS W/TWL LRG LVL3 (GOWN DISPOSABLE) ×1
GOWN STRL REUS W/TWL XL LVL3 (GOWN DISPOSABLE) ×1
IV NS 1000ML (IV SOLUTION) ×1
IV NS 1000ML BAXH (IV SOLUTION) ×1 IMPLANT
KIT BASIN OR (CUSTOM PROCEDURE TRAY) ×2 IMPLANT
KIT TURNOVER KIT B (KITS) ×2 IMPLANT
NEEDLE 22X1 1/2 (OR ONLY) (NEEDLE) ×4 IMPLANT
NS IRRIG 1000ML POUR BTL (IV SOLUTION) ×2 IMPLANT
PAD ARMBOARD 7.5X6 YLW CONV (MISCELLANEOUS) ×2 IMPLANT
SPONGE SURGIFOAM ABS GEL 12-7 (HEMOSTASIS) IMPLANT
SUT CHROMIC 3 0 PS 2 (SUTURE) ×2 IMPLANT
SYR CONTROL 10ML LL (SYRINGE) ×2 IMPLANT
TRAY ENT MC OR (CUSTOM PROCEDURE TRAY) ×2 IMPLANT
TUBING IRRIGATION (MISCELLANEOUS) ×2 IMPLANT
YANKAUER SUCT BULB TIP NO VENT (SUCTIONS) ×2 IMPLANT

## 2017-12-28 NOTE — Op Note (Signed)
12/28/2017  9:39 AM  PATIENT:  Michael [name]  57 y.o. male  PRE-OPERATIVE DIAGNOSIS:  NON-RESTORABLE TEETH # 2, 4, 7, 8, 9, 11, 20, 21, 22, 23, 24, 25, 26, 27, 29, IMPACTED TOOTH #6  POST-OPERATIVE DIAGNOSIS:  SAME  PROCEDURE:  Procedure(s): EXTRACTION TEETH # 2, 4, 6, 7, 8, 9, 11, 20, 21, 22, 23, 24, 25, 26, 27, 29, ALVEOLOPLASTY   SURGEON:  Surgeon(s): Diona Browner, DDS  ANESTHESIA:   local and general  EBL:  minimal  DRAINS: none   SPECIMEN:  No Specimen  COUNTS:  YES  PLAN OF CARE: Discharge to home after PACU  PATIENT DISPOSITION:  PACU - hemodynamically stable.   PROCEDURE DETAILS: Dictation # [id number]  Gae Bon, DMD 12/28/2017 9:39 AM

## 2017-12-28 NOTE — Discharge Instructions (Signed)

## 2017-12-28 NOTE — H&P (Signed)
HISTORY AND PHYSICAL  Michael [name] is a 57 y.o. male patient with CC: painful teeth  No diagnosis found.  Past Medical History:  Diagnosis Date  . Allergic rhinitis   . Arthritis   . Chronic low back pain    follwed in Pain Clinic(Dr.Kirchmayer)  . Depression   . Depression with anxiety   . GERD (gastroesophageal reflux disease)   . History of kidney stones    passed  . Insomnia   . Migraines   . Sarcoidosis    Multisystem,pulmonary and hepatic (Dr.Rourk)Liver bx in 06 mildly postive AMA but on re-check normal    Current Facility-Administered Medications  Medication Dose Route Frequency Provider Last Rate Last Dose  . ceFAZolin (ANCEF) IVPB 2g/100 mL premix  2 g Intravenous On Call to OR Diona Browner, DDS      . lactated ringers infusion   Intravenous Continuous Barnet Glasgow, MD 50 mL/hr at 12/28/17 0716     Allergies  Allergen Reactions  . Aspirin Other (See Comments)    REACTION: "Reacts with sarcoidosis"  . Methadone Other (See Comments)    REACTION: "Become mentally unstable"   Active Problems:   * No active hospital problems. *  Vitals: Blood pressure (!) 150/87, pulse 68, temperature 97.6 F (36.4 C), temperature source Oral, resp. rate 18, height 6\' 2"  (1.88 m), weight 96.2 kg, SpO2 100 %. Lab results: Results for orders placed or performed during the hospital encounter of 12/28/17 (from the past 24 hour(s))  CBC     Status: Abnormal   Collection Time: 12/28/17  6:58 AM  Result Value Ref Range   WBC 9.7 4.0 - 10.5 K/uL   RBC 4.17 (L) 4.22 - 5.81 MIL/uL   Hemoglobin 13.0 13.0 - 17.0 g/dL   HCT 39.8 39.0 - 52.0 %   MCV 95.4 78.0 - 100.0 fL   MCH 31.2 26.0 - 34.0 pg   MCHC 32.7 30.0 - 36.0 g/dL   RDW 14.5 11.5 - 15.5 %   Platelets 280 150 - 400 K/uL   Radiology Results: No results found. General appearance: alert, cooperative and no distress Head: Normocephalic, without obvious abnormality, atraumatic Eyes: negative Nose: Nares normal. Septum  midline. Mucosa normal. No drainage or sinus tenderness. Throat: multiple carious teeth, impacted tooth #6. No purulence, edema, trismus. Pharynx clear. Neck: no adenopathy, supple, symmetrical, trachea midline and thyroid not enlarged, symmetric, no tenderness/mass/nodules Resp: clear to auscultation bilaterally Cardio: regular rate and rhythm, S1, S2 normal, no murmur, click, rub or gallop  Assessment: multiple nonrestorable and impacted teeth  Plan: full mouth extraction. Alveoloplasty. GA. Day surgery.   Diona Browner 12/28/2017

## 2017-12-28 NOTE — Op Note (Signed)
NAME: Michael [name], Michael [name] MEDICAL RECORD [id number] ACCOUNT [id number] DATE OF BIRTH:1960/09/05 FACILITY: [facility] LOCATION: [facility] PHYSICIAN:Carlissa Pesola M. Leylah Tarnow, DDS  OPERATIVE REPORT  DATE OF PROCEDURE:  12/28/2017  PREOPERATIVE DIAGNOSIS:  Multiple nonrestorable teeth secondary to dental caries, numbers 2, 4, 7, 8, 9, 11, 20, 21, 22, 23, 24, 25, 26, 27, 29, bony impacted tooth #6.  POSTOPERATIVE DIAGNOSIS:  Multiple nonrestorable teeth secondary to dental caries, numbers 2, 4, 7, 8, 9, 11, 20, 21, 22, 23, 24, 25, 26, 27, 29, bony impacted tooth #6.  PROCEDURE:  Extraction of teeth numbers 2, 4, 6, 7, 8, 9, 11, 20, 21, 22, 23, 24, 25, 26, 27, 29, alveoplasty.  SURGEON:  Diona Browner, DDS  ANESTHESIA:  General nasal intubation.  DESCRIPTION OF PROCEDURE:  The patient was taken to the operating room and placed on the table in supine position.  General anesthesia was administered, and a nasal endotracheal tube was placed and secured.  The eyes were protected and the patient was  draped for surgery.  A timeout was performed.  The posterior pharynx was suctioned and a throat pack was placed.  Lidocaine 2% 1:100,000 epinephrine was infiltrated in an inferior alveolar block on the right and left side and then buccal infiltration in  the anterior mandible as well as buccal and palatal infiltration in the maxilla around the teeth to be removed.  A bite block was placed on the right side of the mouth, and a sweetheart retractor was used to retract the tongue.  A #15 blade was used to  make an incision beginning 1 cm proximal to tooth #20 along the alveolar crest, and it was split to go in the buccal gingival sulcus until tooth #27 was encountered, and then another incision was made in the lingual gingival sulcus to tooth #27.  The  periosteum was reflected from around these teeth.  The teeth were elevated with a 301 elevator and removed from the mouth with the dental forceps and the rongeurs.  Then, the  periosteum was reflected to expose the alveolar crest, which was irregular in  contour due to the socket and bone loss.  The sockets were curetted, and then an alveoplasty was performed using an egg-shaped bur under irrigation, the Stryker handpiece and then using a bone file.  Then, this area was closed with 3-0 chromic.  Then, in  the left maxilla, a 15 blade was used to make an incision distal to tooth #11 and carried forward on the alveolar ridge crest to tooth #4 buccally and palatally in the gingival sulcus.  The periosteum was reflected from around these teeth.  The teeth  were elevated with a 301 elevator, and tooth numbers 11, 9, 8, 7 were removed using the forceps.  Then, tooth #6 was identified palatal beneath the lingual alveolar plate.  Bone was removed to expose this tooth.  The tooth was sectioned, the crown being  removed first, and then the additional bone was removed to free the root.  Then, the root was removed with a 301 elevator.  Then, the sockets of these teeth were curetted.  The periosteum was reflected to expose the alveolar crest.  Bone was recontoured  with a Stryker handpiece and bone file to perform the alveoplasty.  Then, the area was irrigated and closed with 3-0 chromic.  Then, the sweetheart retractor and bite block were repositioned to the other side of the mouth.  A 15 blade was used to make an  incision around tooth #29 and  then in the maxilla around teeth numbers 3 and 4.  The periosteum was reflected.  The teeth were elevated and removed with the dental forceps.  The sockets were curetted, and the tissue was reflected to expose the alveolar  crest.  Then, alveoplasty was performed to create a more normal contour for denture fabrication.  Then, the areas were irrigated and closed with 3-0 chromic.  The oral cavity was inspected, irrigated and suctioned, and the throat pack was removed.  The  patient was left in the care of anesthesia for extubation and transport to  recovery room with plans for discharge through day surgery.  ESTIMATED BLOOD LOSS:  Minimal.  COMPLICATIONS:  None.  SPECIMENS:  None.  LN/NUANCE  D:12/28/2017 T:12/28/2017 JOB:[id number]

## 2017-12-28 NOTE — Transfer of Care (Signed)
Immediate Anesthesia Transfer of Care Note  Patient: LONN IM  Procedure(s) Performed: DENTAL RESTORATION/EXTRACTIONS (Bilateral )  Patient Location: PACU  Anesthesia Type:General  Level of Consciousness: awake, alert , oriented and patient cooperative  Airway & Oxygen Therapy: Patient Spontanous Breathing  Post-op Assessment: Report given to RN and Post -op Vital signs reviewed and stable  Post vital signs: Reviewed and stable  Last Vitals:  Vitals Value Taken Time  BP    Temp    Pulse 92 12/28/2017  9:47 AM  Resp 18 12/28/2017  9:47 AM  SpO2 95 % 12/28/2017  9:47 AM  Vitals shown include unvalidated device data.  Last Pain:  Vitals:   12/28/17 0645  TempSrc:   PainSc: 6          Complications: No apparent anesthesia complications

## 2017-12-28 NOTE — H&P (Signed)
Anesthesia H&P Update: History and Physical Exam reviewed; patient is OK for planned anesthetic and procedure. ? ?

## 2017-12-28 NOTE — Anesthesia Procedure Notes (Signed)
Procedure Name: Intubation Date/Time: 12/28/2017 8:56 AM Performed by: Shirlyn Goltz, CRNA Pre-anesthesia Checklist: Patient identified, Emergency Drugs available, Suction available and Patient being monitored Patient Re-evaluated:Patient Re-evaluated prior to induction Oxygen Delivery Method: Circle system utilized Preoxygenation: Pre-oxygenation with 100% oxygen Induction Type: IV induction Ventilation: Mask ventilation without difficulty Laryngoscope Size: Mac and 4 Grade View: Grade I Nasal Tubes: Right, Nasal prep performed and Nasal Rae Tube size: 7.5 mm Number of attempts: 1 Placement Confirmation: ETT inserted through vocal cords under direct vision,  positive ETCO2 and breath sounds checked- equal and bilateral Tube secured with: Tape Dental Injury: Teeth and Oropharynx as per pre-operative assessment

## 2017-12-28 NOTE — Anesthesia Postprocedure Evaluation (Signed)
Anesthesia Post Note  Patient: Michael [name]  Procedure(s) Performed: DENTAL RESTORATION/EXTRACTIONS (Bilateral )     Patient location during evaluation: PACU Anesthesia Type: General Level of consciousness: awake and alert Pain management: pain level controlled Vital Signs Assessment: post-procedure vital signs reviewed and stable Respiratory status: spontaneous breathing, nonlabored ventilation, respiratory function stable and patient connected to nasal cannula oxygen Cardiovascular status: blood pressure returned to baseline and stable Postop Assessment: no apparent nausea or vomiting Anesthetic complications: no    Last Vitals:  Vitals:   12/28/17 1020 12/28/17 1021  BP: (!) 176/94   Pulse: 71   Resp: 19 18  Temp:    SpO2: 98%     Last Pain:  Vitals:   12/28/17 1021  TempSrc:   PainSc: 0-No pain                 Barnet Glasgow

## 2017-12-28 NOTE — Progress Notes (Signed)
Pt denies pain, NSR 60's, BP 170's/ 96- 104. Dr Valma Cava fully updated- OK to dc pt to home.

## 2017-12-29 ENCOUNTER — Encounter (HOSPITAL_COMMUNITY): Payer: Self-pay | Admitting: Oral Surgery

## 2018-01-03 DIAGNOSIS — K7460 Unspecified cirrhosis of liver: Secondary | ICD-10-CM | POA: Diagnosis not present

## 2018-01-03 DIAGNOSIS — Z79899 Other long term (current) drug therapy: Secondary | ICD-10-CM | POA: Diagnosis not present

## 2018-01-03 DIAGNOSIS — R079 Chest pain, unspecified: Secondary | ICD-10-CM | POA: Diagnosis not present

## 2018-01-03 DIAGNOSIS — N19 Unspecified kidney failure: Secondary | ICD-10-CM | POA: Diagnosis not present

## 2018-03-12 DIAGNOSIS — R030 Elevated blood-pressure reading, without diagnosis of hypertension: Secondary | ICD-10-CM | POA: Diagnosis not present

## 2018-03-14 DIAGNOSIS — I208 Other forms of angina pectoris: Secondary | ICD-10-CM | POA: Diagnosis not present

## 2018-03-14 DIAGNOSIS — N183 Chronic kidney disease, stage 3 (moderate): Secondary | ICD-10-CM | POA: Diagnosis not present

## 2018-03-14 DIAGNOSIS — D869 Sarcoidosis, unspecified: Secondary | ICD-10-CM | POA: Diagnosis not present

## 2018-03-14 DIAGNOSIS — Z23 Encounter for immunization: Secondary | ICD-10-CM | POA: Diagnosis not present

## 2018-04-30 DIAGNOSIS — D860 Sarcoidosis of lung: Secondary | ICD-10-CM | POA: Diagnosis not present

## 2018-04-30 DIAGNOSIS — N183 Chronic kidney disease, stage 3 (moderate): Secondary | ICD-10-CM | POA: Diagnosis not present

## 2018-05-04 DIAGNOSIS — K5730 Diverticulosis of large intestine without perforation or abscess without bleeding: Secondary | ICD-10-CM | POA: Diagnosis not present

## 2018-05-04 DIAGNOSIS — R748 Abnormal levels of other serum enzymes: Secondary | ICD-10-CM | POA: Diagnosis not present

## 2018-05-04 DIAGNOSIS — I700 Atherosclerosis of aorta: Secondary | ICD-10-CM | POA: Diagnosis not present

## 2018-05-04 DIAGNOSIS — Z79899 Other long term (current) drug therapy: Secondary | ICD-10-CM | POA: Diagnosis not present

## 2018-05-04 DIAGNOSIS — R1084 Generalized abdominal pain: Secondary | ICD-10-CM | POA: Diagnosis not present

## 2018-05-04 DIAGNOSIS — I10 Essential (primary) hypertension: Secondary | ICD-10-CM | POA: Diagnosis not present

## 2018-05-09 ENCOUNTER — Other Ambulatory Visit: Payer: Self-pay

## 2018-05-09 NOTE — Patient Outreach (Signed)
[place] [facility]) Care Management  05/09/2018  Michael [name] 09/29/1960 [id number]   Telephone Screen  Referral Date: 05/09/2018 Referral Source: [facility]-Dept -Urgent Referral Reason: "member needs [facility] [place] assistance Insurance: [facility] Medicare  Outreach attempt #1 to patient. No answer. RN CM left HIPAA compliant voicemail message along with contact info.    Plan: RN CM will make outreach attempt to patient within 3-4 business days. RN CM will send unsuccessful outreach letter to patient.  Enzo Montgomery, RN,BSN,CCM [place] Management Telephonic Care Management Coordinator Direct Phone: [phone] Toll Free: [phone] Fax: [phone]

## 2018-05-09 NOTE — Patient Outreach (Signed)
[place] [facility]) Care Management  05/09/2018  Michael [name] [birth year] [id number]   Telephone Screen  Referral Date: 05/09/2018 Referral Source: [facility]-UM-Dept -Urgent Referral Reason: "member needs [facility] assistance Insurance: [facility] Medicare    Voicemail message received from patient. Return call to patient.Screening completed.   Social: Patient resides in his home alone. He states that he recently moved to new place a few months ago. Patient immediately voices that he has no working heat. He reports he has contacted landlord several times for the past week and has not gotten a call back or anyone to come out and repair it. He states that he is looking for in home assistance to help with preparing meals and assisting him with ADLs. Patient has Medicaid but states he has not applied for in home assistance and needs help. He also reports that he was driving but feels it is no longer safe for him to drive. While driving he was getting gas vouches from Medicaid to help cover gas expenses. However, now he is paying $40-50 to his friend and brother in law to take him to appts. He reports he has never been set up with Medicaid transportation to take him back and forth to appts as he was driving until recently.   Conditions: Per chat review, patient has PMH of, but not limited to: allergic rhinitis, arthritis, chronic back pain, depression/anxiety, GERD and sarcoidosis. H reports that hi balance is off related to dizzy meds.RN CM encouraged patient to obtain BP monitor and start monitoring BP in the home.  He saw MD regarding the matter and was placed on meds to help. Patient also voices that he suffers from depression. He is taking medication for it but does not feel like he needs it.He states that he deals with his depression "by staying in the house." PHQ-2 score 0 this call. Per patient report, he has a "blockage" in heart and will be going further evaluation within the next month for possible  surgery. Patient denies needing any further nursing assistance in managing and caring for chronic conditions.  Medications:Patient reports taking about five meds. He is able to manage meds on his own. He denies any issues affording and/or obtaining meds.   Appointments: Patient states he has new PCP(unable to provide name-just states office is by [facility]). He voices that he saw PCP in December and sees again in Feb.  Advance Directives: None. Patient interested in further info and assistance completing form. He voices that he would want his niece who is a nurse to be his [facility] POA.   Consent:[facility] services reviewed and discussed with patient. Verbal consent for services given. Patient agreeable to [facility] SW assistance.   Plan: RN CM will send [facility] SW assistance for possible assistance with heating, transportation, personal care /in home support and advance directives.   Enzo Montgomery, RN,BSN,CCM [place] Management Telephonic Care Management Coordinator Direct Phone: [phone] Toll Free: [phone] Fax: [phone]

## 2018-05-10 ENCOUNTER — Other Ambulatory Visit: Payer: Self-pay

## 2018-05-10 NOTE — Patient Outreach (Signed)
[place] [facility]) Care Management  05/10/2018  Michael [name] 02-Oct-1960 [id number]   Successful outreach to Michael [name] regarding social work referral. Referral reported that he has no heat in his home, needing transportation assistance, and request for personal care services.  Michael [name] reported that he was contacted by his landlord today regarding having the heat fixed.  Someone actually showed up during our conversation to work on this issue.  Michael [name] said that he would prefer to locate another apartment.  He is eligible for Section 8 housing so BSW encouraged him to contact the [place] about relocating.  BSW agreed to send applications for [employer] and [employer] as they manage many low income properties in the [facility] area.  BSW informed him that all of these properties likely have a wait list.  Michael [name] does not wish to relocate to another county.  He said that his niece would be able to assist in completion of applications. BSW educated Michael [name] about personal care services and request process.  BSW informed him that request for services must be completed by MD.  He said that his "family doctor" would be willing to complete request but could not remember his name during our call.  He agreed to call BSW back with this information so the request can be faxed to him for completion.   BSW will follow up with Michael [name] within the next two weeks to ensure receipt of housing applications.  Ronn Melena, BSW Social Worker [phone]

## 2018-05-14 ENCOUNTER — Other Ambulatory Visit: Payer: Self-pay

## 2018-05-14 NOTE — Patient Outreach (Signed)
[place] [facility]) Care Management  05/14/2018  Michael [name] 1960/05/19 [id number]  Request for [place] faxed to Dr. Carin Primrose.  BSW will send completed form to [facility] when received.   Ronn Melena, BSW Social Worker [phone]

## 2018-05-16 ENCOUNTER — Other Ambulatory Visit: Payer: Self-pay

## 2018-05-16 NOTE — Patient Outreach (Signed)
[place] [facility]) Care Management  05/16/2018  Michael [name] 09/03/1960 [id number]   Incoming call from [name].  He confirmed receipt of housing applications that were mailed.  [name] is requesting housing information for the [place] area as well.  BSW agreed to mail housing information to him but encouraged him to contact [employer] since he has been approved for Section 8 and they are aware of availability at properties.  BSW offered to provide him with contact information for [facility] but he asked that this information be included with documentation being mailed.  BSW will get information sent to him next week.  Ronn Melena, BSW Social Worker [phone]

## 2018-05-17 DIAGNOSIS — D860 Sarcoidosis of lung: Secondary | ICD-10-CM | POA: Diagnosis not present

## 2018-05-17 DIAGNOSIS — I208 Other forms of angina pectoris: Secondary | ICD-10-CM | POA: Diagnosis not present

## 2018-05-17 DIAGNOSIS — R079 Chest pain, unspecified: Secondary | ICD-10-CM | POA: Diagnosis not present

## 2018-05-20 ENCOUNTER — Other Ambulatory Visit: Payer: Self-pay

## 2018-05-20 NOTE — Patient Outreach (Signed)
[facility] [facility]) Care Management  05/20/2018  DRE GAMINO 03-11-1961 [id number]   Outreach to [facility] to confirm receipt of request for personal care services.  Receipt was confirmed and request has been sent to Dr. Marcell Anger nurse to have him complete it.   List of low income housing in [place] was mailed to patient today as requested.  Ronn Melena, BSW Social Worker [phone]

## 2018-05-22 ENCOUNTER — Ambulatory Visit: Payer: Medicare Other

## 2018-05-23 DIAGNOSIS — R809 Proteinuria, unspecified: Secondary | ICD-10-CM | POA: Diagnosis not present

## 2018-05-23 DIAGNOSIS — Z1159 Encounter for screening for other viral diseases: Secondary | ICD-10-CM | POA: Diagnosis not present

## 2018-05-23 DIAGNOSIS — N183 Chronic kidney disease, stage 3 (moderate): Secondary | ICD-10-CM | POA: Diagnosis not present

## 2018-05-23 DIAGNOSIS — Z79899 Other long term (current) drug therapy: Secondary | ICD-10-CM | POA: Diagnosis not present

## 2018-05-23 DIAGNOSIS — D509 Iron deficiency anemia, unspecified: Secondary | ICD-10-CM | POA: Diagnosis not present

## 2018-05-23 DIAGNOSIS — E559 Vitamin D deficiency, unspecified: Secondary | ICD-10-CM | POA: Diagnosis not present

## 2018-05-23 DIAGNOSIS — I129 Hypertensive chronic kidney disease with stage 1 through stage 4 chronic kidney disease, or unspecified chronic kidney disease: Secondary | ICD-10-CM | POA: Diagnosis not present

## 2018-05-24 ENCOUNTER — Ambulatory Visit: Payer: Self-pay

## 2018-05-28 ENCOUNTER — Ambulatory Visit: Payer: Self-pay

## 2018-05-28 ENCOUNTER — Other Ambulatory Visit: Payer: Self-pay

## 2018-05-28 NOTE — Patient Outreach (Signed)
[place] [facility]) Care Management  05/28/2018  Michael [name] [birth year] [id number]   Completed request for personal care services faxed to [facility] today.  BSW will follow up with [facility] before the end of the week to ensure receipt.  BSW will also follow up with Michael [name] to provide update and to ensure receipt of resources mailed on 05/20/18.  Ronn Melena, BSW Social Worker [phone]

## 2018-05-29 DIAGNOSIS — R030 Elevated blood-pressure reading, without diagnosis of hypertension: Secondary | ICD-10-CM | POA: Diagnosis not present

## 2018-05-29 DIAGNOSIS — M5416 Radiculopathy, lumbar region: Secondary | ICD-10-CM | POA: Diagnosis not present

## 2018-05-29 DIAGNOSIS — M545 Low back pain: Secondary | ICD-10-CM | POA: Diagnosis not present

## 2018-05-30 ENCOUNTER — Other Ambulatory Visit: Payer: Self-pay

## 2018-05-30 NOTE — Patient Outreach (Signed)
[place] [facility]) Care Management  05/30/2018  Michael [name] Oct 14, 1960 [id number]   Outreach to [employer] to ensure receipt of request for personal care services that was faxed on 05/28/18.  Receipt was confirmed but the request could not be processed due to patient information and provider NPI being eligible.  BSW contacted [facility] to request that form be faxed again.  BSW will resend to [employer] when received.  BSW contacted Mr. Bignell to ensure receipt of housing resources mailed on 05/20/18 and to provide above update.  Mr. Wattenbarger did not receive resources so BSW is mailing again.   BSW will follow up with Mr. Lecomte again when Janeece Riggers confirms receipt of legible request form.   Ronn Melena, BSW Social Worker [phone]

## 2018-05-31 ENCOUNTER — Other Ambulatory Visit: Payer: Self-pay

## 2018-05-31 NOTE — Patient Outreach (Signed)
[place] [facility]) Care Management  05/31/2018  Michael [name] 18-Sep-1960 [id number]   Request for personal care services faxed to [facility] again due to last fax being illegible.  BSW will follow up next week to ensure receipt.  Ronn Melena, BSW Social Worker [phone]

## 2018-06-04 ENCOUNTER — Other Ambulatory Visit: Payer: Self-pay

## 2018-06-04 NOTE — Patient Outreach (Signed)
[place] [facility]) Care Management  06/04/2018  CORDEL DREWES [birth year] [id number]   Outreach to [employer] regarding request for personal care services that was resent on 05/31/18.  Request was received and Mr. Costanzo will be contacted sometime this week to schedule the in-home assessment.  BSW called Mr. Amini to provide the above update.  BSW will follow up with him within the next two weeks regarding status of services.   Ronn Melena, BSW Social Worker [phone]

## 2018-06-14 DIAGNOSIS — D869 Sarcoidosis, unspecified: Secondary | ICD-10-CM | POA: Diagnosis not present

## 2018-06-14 DIAGNOSIS — N183 Chronic kidney disease, stage 3 (moderate): Secondary | ICD-10-CM | POA: Diagnosis not present

## 2018-06-14 DIAGNOSIS — R21 Rash and other nonspecific skin eruption: Secondary | ICD-10-CM | POA: Diagnosis not present

## 2018-06-17 ENCOUNTER — Ambulatory Visit: Payer: Medicare Other

## 2018-06-18 ENCOUNTER — Ambulatory Visit: Payer: Self-pay

## 2018-06-18 ENCOUNTER — Other Ambulatory Visit: Payer: Self-pay

## 2018-06-18 NOTE — Patient Outreach (Signed)
[place] [facility]) Care Management  06/18/2018  Michael [name] Aug 28, 1960 [id number]   Unsuccessful outreach to patient regarding status of request for personal care services as he should have been contacted by [facility] for assessment. BSW left voicemail messages on home and mobile numbers.  Will attempt to reach again within four business days.  Ronn Melena, BSW Social Worker [phone]

## 2018-06-20 ENCOUNTER — Other Ambulatory Visit: Payer: Self-pay

## 2018-06-20 NOTE — Patient Outreach (Signed)
[place] [facility]) Care Management  06/20/2018  SWADE SHONKA 08/20/1960 [id number]   Successful outreach to patient regarding status of request for personal care services.  Patient reported that assessment with [facility] occurred on 06/14/18 and he was approved for services.  Patient is still seeking different housing.  He confirmed receipt of resources that were previously mailed and said that he has submitted a couple of applications. Per patient, he spoke with the manager of an apartment complex in [place] that should have availability by next month.   BSW is closing case at this time due to no other socia work needs being identified.   Ronn Melena, BSW Social Worker [phone]

## 2018-06-25 DIAGNOSIS — N182 Chronic kidney disease, stage 2 (mild): Secondary | ICD-10-CM | POA: Diagnosis not present

## 2018-06-25 DIAGNOSIS — D649 Anemia, unspecified: Secondary | ICD-10-CM | POA: Diagnosis not present

## 2018-06-25 DIAGNOSIS — D869 Sarcoidosis, unspecified: Secondary | ICD-10-CM | POA: Diagnosis not present

## 2018-07-05 ENCOUNTER — Other Ambulatory Visit: Payer: Self-pay

## 2018-07-05 NOTE — Patient Outreach (Signed)
[facility]) Care Management  07/05/2018  DONOVEN PETT 1961-04-04 [id number]   Incoming call from patient requesting housing resources for [facility].  Resources mailed today.  Ronn Melena, BSW Social Worker [phone]
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
## 2018-07-29 DIAGNOSIS — M12819 Other specific arthropathies, not elsewhere classified, unspecified shoulder: Secondary | ICD-10-CM | POA: Diagnosis not present

## 2018-08-09 DIAGNOSIS — E669 Obesity, unspecified: Secondary | ICD-10-CM | POA: Insufficient documentation

## 2018-08-09 DIAGNOSIS — D860 Sarcoidosis of lung: Secondary | ICD-10-CM | POA: Diagnosis not present

## 2018-08-09 DIAGNOSIS — R0683 Snoring: Secondary | ICD-10-CM | POA: Diagnosis not present

## 2018-08-09 DIAGNOSIS — N182 Chronic kidney disease, stage 2 (mild): Secondary | ICD-10-CM | POA: Diagnosis not present

## 2018-08-13 DIAGNOSIS — M12819 Other specific arthropathies, not elsewhere classified, unspecified shoulder: Secondary | ICD-10-CM | POA: Diagnosis not present

## 2018-08-13 DIAGNOSIS — M25511 Pain in right shoulder: Secondary | ICD-10-CM | POA: Diagnosis not present

## 2018-08-13 DIAGNOSIS — M12811 Other specific arthropathies, not elsewhere classified, right shoulder: Secondary | ICD-10-CM | POA: Diagnosis not present

## 2018-08-18 DIAGNOSIS — M129 Arthropathy, unspecified: Secondary | ICD-10-CM | POA: Diagnosis not present

## 2018-08-18 DIAGNOSIS — J309 Allergic rhinitis, unspecified: Secondary | ICD-10-CM | POA: Diagnosis not present

## 2018-08-18 DIAGNOSIS — Z720 Tobacco use: Secondary | ICD-10-CM | POA: Diagnosis not present

## 2018-08-18 DIAGNOSIS — D869 Sarcoidosis, unspecified: Secondary | ICD-10-CM | POA: Diagnosis not present

## 2018-08-18 DIAGNOSIS — Z7952 Long term (current) use of systemic steroids: Secondary | ICD-10-CM | POA: Diagnosis not present

## 2018-08-18 DIAGNOSIS — M5416 Radiculopathy, lumbar region: Secondary | ICD-10-CM | POA: Diagnosis not present

## 2018-08-18 DIAGNOSIS — Z79899 Other long term (current) drug therapy: Secondary | ICD-10-CM | POA: Diagnosis not present

## 2018-08-18 DIAGNOSIS — N182 Chronic kidney disease, stage 2 (mild): Secondary | ICD-10-CM | POA: Diagnosis not present

## 2018-08-18 DIAGNOSIS — K219 Gastro-esophageal reflux disease without esophagitis: Secondary | ICD-10-CM | POA: Diagnosis not present

## 2018-08-18 DIAGNOSIS — Z7951 Long term (current) use of inhaled steroids: Secondary | ICD-10-CM | POA: Diagnosis not present

## 2018-08-18 DIAGNOSIS — G4730 Sleep apnea, unspecified: Secondary | ICD-10-CM | POA: Diagnosis not present

## 2018-08-18 DIAGNOSIS — R0683 Snoring: Secondary | ICD-10-CM | POA: Diagnosis not present

## 2018-08-28 DIAGNOSIS — M5136 Other intervertebral disc degeneration, lumbar region: Secondary | ICD-10-CM | POA: Diagnosis not present

## 2018-08-28 DIAGNOSIS — M545 Low back pain: Secondary | ICD-10-CM | POA: Diagnosis not present

## 2018-08-28 DIAGNOSIS — Z981 Arthrodesis status: Secondary | ICD-10-CM | POA: Diagnosis not present

## 2018-08-29 DIAGNOSIS — N183 Chronic kidney disease, stage 3 (moderate): Secondary | ICD-10-CM | POA: Diagnosis not present

## 2018-08-29 DIAGNOSIS — D869 Sarcoidosis, unspecified: Secondary | ICD-10-CM | POA: Diagnosis not present

## 2018-08-29 DIAGNOSIS — M7581 Other shoulder lesions, right shoulder: Secondary | ICD-10-CM | POA: Diagnosis not present

## 2018-08-29 DIAGNOSIS — M25511 Pain in right shoulder: Secondary | ICD-10-CM | POA: Diagnosis not present

## 2018-09-18 DIAGNOSIS — E559 Vitamin D deficiency, unspecified: Secondary | ICD-10-CM | POA: Diagnosis not present

## 2018-09-18 DIAGNOSIS — R42 Dizziness and giddiness: Secondary | ICD-10-CM | POA: Diagnosis not present

## 2018-09-18 DIAGNOSIS — N183 Chronic kidney disease, stage 3 (moderate): Secondary | ICD-10-CM | POA: Diagnosis not present

## 2018-09-18 DIAGNOSIS — R05 Cough: Secondary | ICD-10-CM | POA: Diagnosis not present

## 2018-09-18 DIAGNOSIS — E782 Mixed hyperlipidemia: Secondary | ICD-10-CM | POA: Diagnosis not present

## 2018-09-26 DIAGNOSIS — L299 Pruritus, unspecified: Secondary | ICD-10-CM | POA: Diagnosis not present

## 2018-09-26 DIAGNOSIS — K2900 Acute gastritis without bleeding: Secondary | ICD-10-CM | POA: Diagnosis not present

## 2018-09-26 DIAGNOSIS — K7460 Unspecified cirrhosis of liver: Secondary | ICD-10-CM | POA: Diagnosis not present

## 2018-09-26 DIAGNOSIS — Z79891 Long term (current) use of opiate analgesic: Secondary | ICD-10-CM | POA: Diagnosis not present

## 2018-09-26 DIAGNOSIS — Z885 Allergy status to narcotic agent status: Secondary | ICD-10-CM | POA: Diagnosis not present

## 2018-09-26 DIAGNOSIS — K429 Umbilical hernia without obstruction or gangrene: Secondary | ICD-10-CM | POA: Diagnosis not present

## 2018-09-26 DIAGNOSIS — Z87442 Personal history of urinary calculi: Secondary | ICD-10-CM | POA: Diagnosis not present

## 2018-09-26 DIAGNOSIS — R1084 Generalized abdominal pain: Secondary | ICD-10-CM | POA: Diagnosis not present

## 2018-09-26 DIAGNOSIS — Z79899 Other long term (current) drug therapy: Secondary | ICD-10-CM | POA: Diagnosis not present

## 2018-09-26 DIAGNOSIS — R197 Diarrhea, unspecified: Secondary | ICD-10-CM | POA: Diagnosis not present

## 2018-10-01 DIAGNOSIS — M25511 Pain in right shoulder: Secondary | ICD-10-CM | POA: Diagnosis not present

## 2018-10-01 DIAGNOSIS — E559 Vitamin D deficiency, unspecified: Secondary | ICD-10-CM | POA: Diagnosis not present

## 2018-10-03 ENCOUNTER — Other Ambulatory Visit: Payer: Self-pay

## 2018-10-03 NOTE — Patient Outreach (Signed)
[place] [facility]) Care Management  10/03/2018  Michael [name] 05-31-1960 [id number]   Referral Date:10/02/2018 Referral Source: [facility] referral Referral Reason: Patient health status declining, right shoulder pain and recent ED visit.   Outreach Attempt: Spoke with patient. He is able to verify HIPAA.  Discussed reason for referral.  Patient states that he has had chronic pain in his right shoulder and the doctors has not found out what is going on with him.  However, patient states he just talked with physician and will see him next week and states that he will get a shot to his shoulder and be referred to an orthopedic.  CM asked patient about his PCP. Patient states that he is seen by Rosealee Albee at [facility].  Unfortunately he is not a [facility] physician and patient advised of this and also advised patient to follow up with his physician as scheduled in order to figure out what is going on with him.  He verbalized understanding.     Plan: RN CM will close case.     Jone Baseman, RN, MSN [facility] Care Management Care Management Coordinator Direct Line [phone] Toll Free: [phone]  Fax: [phone]

## 2018-10-10 DIAGNOSIS — M25511 Pain in right shoulder: Secondary | ICD-10-CM | POA: Diagnosis not present

## 2018-10-10 DIAGNOSIS — M12819 Other specific arthropathies, not elsewhere classified, unspecified shoulder: Secondary | ICD-10-CM | POA: Diagnosis not present

## 2018-10-16 DIAGNOSIS — Z79899 Other long term (current) drug therapy: Secondary | ICD-10-CM | POA: Diagnosis not present

## 2018-10-16 DIAGNOSIS — Z5181 Encounter for therapeutic drug level monitoring: Secondary | ICD-10-CM | POA: Diagnosis not present

## 2018-10-16 DIAGNOSIS — Z79891 Long term (current) use of opiate analgesic: Secondary | ICD-10-CM | POA: Diagnosis not present

## 2018-10-24 ENCOUNTER — Ambulatory Visit (INDEPENDENT_AMBULATORY_CARE_PROVIDER_SITE_OTHER): Payer: Medicare Other | Admitting: Orthopaedic Surgery

## 2018-10-24 ENCOUNTER — Other Ambulatory Visit: Payer: Self-pay

## 2018-10-24 ENCOUNTER — Encounter: Payer: Self-pay | Admitting: Orthopaedic Surgery

## 2018-10-24 VITALS — BP 122/90 | HR 74 | Temp 97.3°F | Ht 74.0 in | Wt 216.0 lb

## 2018-10-24 DIAGNOSIS — G8929 Other chronic pain: Secondary | ICD-10-CM | POA: Diagnosis not present

## 2018-10-24 DIAGNOSIS — M25511 Pain in right shoulder: Secondary | ICD-10-CM | POA: Diagnosis not present

## 2018-10-24 NOTE — Progress Notes (Signed)
Subjective:    Patient ID: Michael [name], male    DOB: 11/05/1960, 58 y.o.   MRN: [id number]  HPI He has several months history of increasing pain of the right shoulder. He has no trauma, no redness, no swelling.  He has taken NSAIDs, had two injections and still has pain.  He has pain with overhead use.  He has pain sleeping on it.  I have reviewed his notes from [facility].  I have reviewed his X-rays and report.   Review of Systems  Constitutional: Positive for activity change.  Musculoskeletal: Positive for arthralgias and back pain.  All other systems reviewed and are negative.  For Review of Systems, all other systems reviewed and are negative.  The following is a summary of the past history medically, past history surgically, known current medicines, social history and family history.  This information is gathered electronically by the computer from prior information and documentation.  I review this each visit and have found including this information at this point in the chart is beneficial and informative.   Past Medical History:  Diagnosis Date  . Allergic rhinitis   . Arthritis   . Chronic low back pain    follwed in Pain Clinic(Dr.Kirchmayer)  . Depression   . Depression with anxiety   . GERD (gastroesophageal reflux disease)   . History of kidney stones    passed  . Insomnia   . Migraines   . Sarcoidosis    Multisystem,pulmonary and hepatic (Dr.Rourk)Liver bx in 06 mildly postive AMA but on re-check normal    Past Surgical History:  Procedure Laterality Date  . BACK SURGERY  2012   2nd back surgery 3 monthes ago and in 2007  . COLONOSCOPY  11/05/2003   [id number] internal hemorrhoids, otherwise normal rectum and colon  . ESOPHAGOGASTRODUODENOSCOPY  03/22/2004   [id number] esophagogastroduodenoscopy. Small hiatal hernia otherwise normal stomach  . HARVEST BONE GRAFT Left    thigh  . HIP ARTHROPLASTY Bilateral    left 2017, right 2016  . HIP SURGERY Left 2017   bone graft  . LAMINECTOMY  2009   Balateral L4-L5  . TOOTH EXTRACTION Bilateral 12/28/2017   Procedure: DENTAL RESTORATION/EXTRACTIONS;  Surgeon: Diona Browner, DDS;  Location: [place];  Service: Oral Surgery;  Laterality: Bilateral;    Current Outpatient Medications on File Prior to Visit  Medication Sig Dispense Refill  . albuterol (PROVENTIL) (2.5 MG/3ML) 0.083% nebulizer solution Take 2.5 mg by nebulization daily.     Marland Kitchen amoxicillin (AMOXIL) 500 MG capsule Take 1 capsule (500 mg total) by mouth 3 (three) times daily. 21 capsule 0  . Aspirin-Acetaminophen-Caffeine (GOODY HEADACHE PO) Take 1 packet by mouth daily as needed (for headache).     . Oxycodone HCl 10 MG TABS Take 10 mg by mouth 4 (four) times daily.     . pantoprazole (PROTONIX) 40 MG tablet Take 1 tablet (40 mg total) by mouth daily. Take 30 minutes before breakfast (Patient not taking: Reported on 10/24/2017) 90 tablet 3  . pregabalin (LYRICA) 150 MG capsule Take 150 mg by mouth 3 (three) times daily.     Marland Kitchen tiZANidine (ZANAFLEX) 4 MG tablet Take 4 mg by mouth 2 (two) times daily.   2  . traMADol (ULTRAM) 50 MG tablet Take 1 tablet (50 mg total) by mouth every 6 (six) hours as needed. 20 tablet 0  . XTAMPZA ER 9 MG C12A Take 9 mg by mouth 2 (two) times daily.   0  No current facility-administered medications on file prior to visit.     Social History   Socioeconomic History  . Marital status: Legally Separated    Spouse name: Not on file  . Number of children: Not on file  . Years of education: Not on file  . Highest education level: Not on file  Occupational History  . Not on file  Social Needs  . Financial resource strain: Not on file  . Food insecurity    Worry: Not on file    Inability: Not on file  . Transportation needs    Medical: Not on file    Non-medical: Not on file  Tobacco Use  . Smoking status: Current Every Day Smoker    Packs/day: 0.50    Years: 29.00    Pack years: 14.50    Types: Cigarettes  .  Smokeless tobacco: Never Used  Substance and Sexual Activity  . Alcohol use: Yes    Comment: ocassional  . Drug use: No  . Sexual activity: Not on file  Lifestyle  . Physical activity    Days per week: Not on file    Minutes per session: Not on file  . Stress: Not on file  Relationships  . Social Herbalist on phone: Not on file    Gets together: Not on file    Attends religious service: Not on file    Active member of club or organization: Not on file    Attends meetings of clubs or organizations: Not on file    Relationship status: Not on file  . Intimate partner violence    Fear of current or ex partner: Not on file    Emotionally abused: Not on file    Physically abused: Not on file    Forced sexual activity: Not on file  Other Topics Concern  . Not on file  Social History Narrative  . Not on file    Family History  Problem Relation Age of Onset  . Colon cancer Brother        in his 42s    BP 122/90   Pulse 74   Temp (!) 97.3 F (36.3 C)   Ht 6\' 2"  (1.88 m)   Wt 216 lb (98 kg)   BMI 27.73 kg/m   Body mass index is 27.73 kg/m.     Objective:   Physical Exam Vitals signs reviewed.  Constitutional:      Appearance: He is well-developed.  HENT:     Head: Normocephalic and atraumatic.  Eyes:     Conjunctiva/sclera: Conjunctivae normal.     Pupils: Pupils are equal, round, and reactive to light.  Neck:     Musculoskeletal: Normal range of motion and neck supple.  Cardiovascular:     Rate and Rhythm: Normal rate and regular rhythm.  Pulmonary:     Effort: Pulmonary effort is normal.  Abdominal:     Palpations: Abdomen is soft.  Musculoskeletal:       Arms:  Skin:    General: Skin is warm and dry.  Neurological:     Mental Status: He is alert and oriented to person, place, and time.     Cranial Nerves: No cranial nerve deficit.     Motor: No abnormal muscle tone.     Coordination: Coordination normal.     Deep Tendon Reflexes:  Reflexes are normal and symmetric. Reflexes normal.  Psychiatric:        Behavior: Behavior normal.  Thought Content: Thought content normal.        Judgment: Judgment normal.           Assessment & Plan:   Encounter Diagnosis  Name Primary?  . Chronic right shoulder pain Yes   I will get MRI of the right shoulder.  I am concerned about rotator cuff tear.  Return after MRI.  Call if any problem.  Precautions discussed.   Electronically Signed Sanjuana Kava, MD 7/16/202011:58 AM

## 2018-11-01 ENCOUNTER — Ambulatory Visit (HOSPITAL_COMMUNITY)
Admission: RE | Admit: 2018-11-01 | Discharge: 2018-11-01 | Disposition: A | Discharge status: Home / Self Care | Payer: Medicare Other | Source: Ambulatory Visit | Attending: Orthopaedic Surgery | Admitting: Orthopaedic Surgery

## 2018-11-01 ENCOUNTER — Other Ambulatory Visit: Payer: Self-pay

## 2018-11-01 DIAGNOSIS — G8929 Other chronic pain: Secondary | ICD-10-CM | POA: Diagnosis not present

## 2018-11-01 DIAGNOSIS — M25511 Pain in right shoulder: Secondary | ICD-10-CM | POA: Diagnosis not present

## 2018-11-05 ENCOUNTER — Ambulatory Visit (INDEPENDENT_AMBULATORY_CARE_PROVIDER_SITE_OTHER): Payer: Medicare Other | Admitting: Orthopaedic Surgery

## 2018-11-05 ENCOUNTER — Other Ambulatory Visit: Payer: Self-pay

## 2018-11-05 ENCOUNTER — Encounter: Payer: Self-pay | Admitting: Orthopaedic Surgery

## 2018-11-05 VITALS — BP 134/82 | HR 69 | Temp 98.5°F | Ht 74.0 in | Wt 216.0 lb

## 2018-11-05 DIAGNOSIS — F17210 Nicotine dependence, cigarettes, uncomplicated: Secondary | ICD-10-CM | POA: Diagnosis not present

## 2018-11-05 DIAGNOSIS — G8929 Other chronic pain: Secondary | ICD-10-CM | POA: Diagnosis not present

## 2018-11-05 DIAGNOSIS — T63441A Toxic effect of venom of bees, accidental (unintentional), initial encounter: Secondary | ICD-10-CM | POA: Diagnosis not present

## 2018-11-05 DIAGNOSIS — M25511 Pain in right shoulder: Secondary | ICD-10-CM

## 2018-11-05 NOTE — Progress Notes (Signed)
Patient ID:Michael [name], male DOB:1961-03-24, 58 y.o. BDZ:[id number]  Chief Complaint  Patient presents with  . Shoulder Pain    right shoulder   . Results    MRI review     HPI  Michael [name] is a 58 y.o. male who continues to have pain in the right shoulder.  He  Had MRI which showed: IMPRESSION: Mild appearing rotator cuff tendinopathy is most notable in the supraspinatus. No tear.  Moderate acromioclavicular osteoarthritis results in some mass effect on the supraspinatus.  Small amount of subacromial/subdeltoid fluid suggestive of bursitis.  I have explained the findings to him.  He does not need surgery.  I will begin OT.  He is seeing pain management for pain medicine.   Body mass index is 27.73 kg/m.  ROS  Review of Systems  All other systems reviewed and are negative.  The following is a summary of the past history medically, past history surgically, known current medicines, social history and family history.  This information is gathered electronically by the computer from prior information and documentation.  I review this each visit and have found including this information at this point in the chart is beneficial and informative.    Past Medical History:  Diagnosis Date  . Allergic rhinitis   . Arthritis   . Chronic low back pain    follwed in Pain Clinic(Dr.Kirchmayer)  . Depression   . Depression with anxiety   . GERD (gastroesophageal reflux disease)   . History of kidney stones    passed  . Insomnia   . Migraines   . Sarcoidosis    Multisystem,pulmonary and hepatic (Dr.Rourk)Liver bx in 06 mildly postive AMA but on re-check normal    Past Surgical History:  Procedure Laterality Date  . BACK SURGERY  2012   2nd back surgery 3 monthes ago and in 2007  . COLONOSCOPY  11/05/2003   [id number] internal hemorrhoids, otherwise normal rectum and colon  . ESOPHAGOGASTRODUODENOSCOPY  03/22/2004   [id number] esophagogastroduodenoscopy. Small  hiatal hernia otherwise normal stomach  . HARVEST BONE GRAFT Left    thigh  . HIP ARTHROPLASTY Bilateral    left 2017, right 2016  . HIP SURGERY Left 2017   bone graft  . LAMINECTOMY  2009   Balateral L4-L5  . TOOTH EXTRACTION Bilateral 12/28/2017   Procedure: DENTAL RESTORATION/EXTRACTIONS;  Surgeon: Diona Browner, DDS;  Location: [place];  Service: Oral Surgery;  Laterality: Bilateral;    Family History  Problem Relation Age of Onset  . Colon cancer Brother        in his 30s    Social History Social History   Tobacco Use  . Smoking status: Current Every Day Smoker    Packs/day: 0.50    Years: 29.00    Pack years: 14.50    Types: Cigarettes  . Smokeless tobacco: Never Used  Substance Use Topics  . Alcohol use: Yes    Comment: ocassional  . Drug use: No    Allergies  Allergen Reactions  . Aspirin Other (See Comments)    REACTION: "Reacts with sarcoidosis"  . Methadone Other (See Comments)    REACTION: "Become mentally unstable"    Current Outpatient Medications  Medication Sig Dispense Refill  . albuterol (PROVENTIL) (2.5 MG/3ML) 0.083% nebulizer solution Take 2.5 mg by nebulization daily.     . Aspirin-Acetaminophen-Caffeine (GOODY HEADACHE PO) Take 1 packet by mouth daily as needed (for headache).     . Oxycodone HCl 10 MG TABS Take 10  mg by mouth 4 (four) times daily.     . pantoprazole (PROTONIX) 40 MG tablet Take 1 tablet (40 mg total) by mouth daily. Take 30 minutes before breakfast 90 tablet 3  . pregabalin (LYRICA) 150 MG capsule Take 150 mg by mouth 3 (three) times daily.     Marland Kitchen tiZANidine (ZANAFLEX) 4 MG tablet Take 4 mg by mouth 2 (two) times daily.   2  . traMADol (ULTRAM) 50 MG tablet Take 1 tablet (50 mg total) by mouth every 6 (six) hours as needed. 20 tablet 0  . XTAMPZA ER 9 MG C12A Take 9 mg by mouth 2 (two) times daily.   0  . amoxicillin (AMOXIL) 500 MG capsule Take 1 capsule (500 mg total) by mouth 3 (three) times daily. (Patient not taking:  Reported on 11/05/2018) 21 capsule 0   No current facility-administered medications for this visit.      Physical Exam  Blood pressure 134/82, pulse 69, height 6\' 2"  (1.88 m), weight 216 lb (98 kg).  Constitutional: overall normal hygiene, normal nutrition, well developed, normal grooming, normal body habitus. Assistive device:none  Musculoskeletal: gait and station Limp none, muscle tone and strength are normal, no tremors or atrophy is present.  .  Neurological: coordination overall normal.  Deep tendon reflex/nerve stretch intact.  Sensation normal.  Cranial nerves II-XII intact.   Skin:   Normal overall no scars, lesions, ulcers or rashes. No psoriasis.  Psychiatric: Alert and oriented x 3.  Recent memory intact, remote memory unclear.  Normal mood and affect. Well groomed.  Good eye contact.  Cardiovascular: overall no swelling, no varicosities, no edema bilaterally, normal temperatures of the legs and arms, no clubbing, cyanosis and good capillary refill.  Lymphatic: palpation is normal.  He has pain in the right shoulder but nearly full motion.  NV intact. All other systems reviewed and are negative   The patient has been educated about the nature of the problem(s) and counseled on treatment options.  The patient appeared to understand what I have discussed and is in agreement with it.  Encounter Diagnoses  Name Primary?  . Chronic right shoulder pain Yes  . Nicotine dependence, cigarettes, uncomplicated     PLAN Call if any problems.  Precautions discussed.  Continue current medications.   Return to clinic 3 weeks   Begin OT.  Electronically Signed Sanjuana Kava, MD 7/28/20209:58 AM

## 2018-11-05 NOTE — Patient Instructions (Signed)
Steps to Quit Smoking Smoking tobacco is the leading cause of preventable death. It can affect almost every organ in the body. Smoking puts you and people around you at risk for many serious, long-lasting (chronic) diseases. Quitting smoking can be hard, but it is one of the best things that you can do for your health. It is never too late to quit. How do I get ready to quit? When you decide to quit smoking, make a plan to help you succeed. Before you quit:  Pick a date to quit. Set a date within the next 2 weeks to give you time to prepare.  Write down the reasons why you are quitting. Keep this list in places where you will see it often.  Tell your family, friends, and co-workers that you are quitting. Their support is important.  Talk with your doctor about the choices that may help you quit.  Find out if your health insurance will pay for these treatments.  Know the people, places, things, and activities that make you want to smoke (triggers). Avoid them. What first steps can I take to quit smoking?  Throw away all cigarettes at home, at work, and in your car.  Throw away the things that you use when you smoke, such as ashtrays and lighters.  Clean your car. Make sure to empty the ashtray.  Clean your home, including curtains and carpets. What can I do to help me quit smoking? Talk with your doctor about taking medicines and seeing a counselor at the same time. You are more likely to succeed when you do both.  If you are pregnant or breastfeeding, talk with your doctor about counseling or other ways to quit smoking. Do not take medicine to help you quit smoking unless your doctor tells you to do so. To quit smoking: Quit right away  Quit smoking totally, instead of slowly cutting back on how much you smoke over a period of time.  Go to counseling. You are more likely to quit if you go to counseling sessions regularly. Take medicine You may take medicines to help you quit. Some  medicines need a prescription, and some you can buy over-the-counter. Some medicines may contain a drug called nicotine to replace the nicotine in cigarettes. Medicines may:  Help you to stop having the desire to smoke (cravings).  Help to stop the problems that come when you stop smoking (withdrawal symptoms). Your doctor may ask you to use:  Nicotine patches, gum, or lozenges.  Nicotine inhalers or sprays.  Non-nicotine medicine that is taken by mouth. Find resources Find resources and other ways to help you quit smoking and remain smoke-free after you quit. These resources are most helpful when you use them often. They include:  Online chats with a counselor.  Phone quitlines.  Printed self-help materials.  Support groups or group counseling.  Text messaging programs.  Mobile phone apps. Use apps on your mobile phone or tablet that can help you stick to your quit plan. There are many free apps for mobile phones and tablets as well as websites. Examples include Quit Guide from the CDC and smokefree.gov  What things can I do to make it easier to quit?   Talk to your family and friends. Ask them to support and encourage you.  Call a phone quitline (1-800-QUIT-NOW), reach out to support groups, or work with a counselor.  Ask people who smoke to not smoke around you.  Avoid places that make you want to smoke,   such as: ? Bars. ? Parties. ? Smoke-break areas at work.  Spend time with people who do not smoke.  Lower the stress in your life. Stress can make you want to smoke. Try these things to help your stress: ? Getting regular exercise. ? Doing deep-breathing exercises. ? Doing yoga. ? Meditating. ? Doing a body scan. To do this, close your eyes, focus on one area of your body at a time from head to toe. Notice which parts of your body are tense. Try to relax the muscles in those areas. How will I feel when I quit smoking? Day 1 to 3 weeks Within the first 24 hours,  you may start to have some problems that come from quitting tobacco. These problems are very bad 2-3 days after you quit, but they do not often last for more than 2-3 weeks. You may get these symptoms:  Mood swings.  Feeling restless, nervous, angry, or annoyed.  Trouble concentrating.  Dizziness.  Strong desire for high-sugar foods and nicotine.  Weight gain.  Trouble pooping (constipation).  Feeling like you may vomit (nausea).  Coughing or a sore throat.  Changes in how the medicines that you take for other issues work in your body.  Depression.  Trouble sleeping (insomnia). Week 3 and afterward After the first 2-3 weeks of quitting, you may start to notice more positive results, such as:  Better sense of smell and taste.  Less coughing and sore throat.  Slower heart rate.  Lower blood pressure.  Clearer skin.  Better breathing.  Fewer sick days. Quitting smoking can be hard. Do not give up if you fail the first time. Some people need to try a few times before they succeed. Do your best to stick to your quit plan, and talk with your doctor if you have any questions or concerns. Summary  Smoking tobacco is the leading cause of preventable death. Quitting smoking can be hard, but it is one of the best things that you can do for your health.  When you decide to quit smoking, make a plan to help you succeed.  Quit smoking right away, not slowly over a period of time.  When you start quitting, seek help from your doctor, family, or friends. This information is not intended to replace advice given to you by your health care provider. Make sure you discuss any questions you have with your health care provider. Document Released: 01/21/2009 Document Revised: 06/14/2018 Document Reviewed: 06/15/2018 Elsevier Patient Education  2020 Elsevier Inc.  

## 2018-11-14 ENCOUNTER — Other Ambulatory Visit: Payer: Self-pay

## 2018-11-14 ENCOUNTER — Encounter (HOSPITAL_COMMUNITY): Payer: Self-pay | Admitting: Occupational Therapy

## 2018-11-14 ENCOUNTER — Ambulatory Visit (HOSPITAL_COMMUNITY): Payer: Medicare Other | Attending: Orthopaedic Surgery | Admitting: Occupational Therapy

## 2018-11-14 DIAGNOSIS — G8929 Other chronic pain: Secondary | ICD-10-CM | POA: Insufficient documentation

## 2018-11-14 DIAGNOSIS — R29898 Other symptoms and signs involving the musculoskeletal system: Secondary | ICD-10-CM | POA: Diagnosis not present

## 2018-11-14 DIAGNOSIS — M25511 Pain in right shoulder: Secondary | ICD-10-CM | POA: Insufficient documentation

## 2018-11-14 NOTE — Therapy (Signed)
[place], [place], [address] Phone: [phone]   Fax:  [phone]  Occupational Therapy Evaluation  Patient Details  Name: Michael [name] MRN: [id number] Date of Birth: [birth year] Referring Provider (OT): Dr. Sanjuana Kava   Encounter Date: 11/14/2018  OT End of Session - 11/14/18 1044    Visit Number  1    Number of Visits  8    Date for OT Re-Evaluation  12/14/18    Authorization Type  1) UHC Medicare  2) Medicaid    Authorization Time Period  visits based on medical necessity    OT Start Time  1011    OT Stop Time  1040    OT Time Calculation (min)  29 min    Activity Tolerance  Patient tolerated treatment well    Behavior During Therapy  [facility] for tasks assessed/performed       Past Medical History:  Diagnosis Date  . Allergic rhinitis   . Arthritis   . Chronic low back pain    follwed in Pain Clinic(Dr.Kirchmayer)  . Depression   . Depression with anxiety   . GERD (gastroesophageal reflux disease)   . History of kidney stones    passed  . Insomnia   . Migraines   . Sarcoidosis    Multisystem,pulmonary and hepatic (Dr.Rourk)Liver bx in 06 mildly postive AMA but on re-check normal    Past Surgical History:  Procedure Laterality Date  . BACK SURGERY  2012   2nd back surgery 3 monthes ago and in 2007  . COLONOSCOPY  11/05/2003   [id number] internal hemorrhoids, otherwise normal rectum and colon  . ESOPHAGOGASTRODUODENOSCOPY  03/22/2004   JTT:[id number] esophagogastroduodenoscopy. Small hiatal hernia otherwise normal stomach  . HARVEST BONE GRAFT Left    thigh  . HIP ARTHROPLASTY Bilateral    left 2017, right 2016  . HIP SURGERY Left 2017   bone graft  . LAMINECTOMY  2009   Balateral L4-L5  . TOOTH EXTRACTION Bilateral 12/28/2017   Procedure: DENTAL RESTORATION/EXTRACTIONS;  Surgeon: Diona Browner, DDS;  Location: [place];  Service: Oral Surgery;  Laterality: Bilateral;    There were no vitals  filed for this visit.  Subjective Assessment - 11/14/18 1048    Subjective   S: I don't do much with this arm.    Pertinent History  Pt is a 58 y/o male presenting with right shoulder pain present for approximately 9 months. Pt had cortisone shot 3 weeks ago with no improvement in pain. Pt reports he is not taking any prescription pain medication due to being "kicked out" of the pain clinic. Pt was referred to occupational therapy by Dr. Sanjuana Kava    Special Tests  56/100    Patient Stated Goals  To have less pain in my arm.    Currently in Pain?  Yes    Pain Score  7     Pain Location  Shoulder    Pain Orientation  Left    Pain Descriptors / Indicators  Aching;Stabbing    Pain Type  Chronic pain    Pain Radiating Towards  elbow    Pain Onset  More than a month ago    Pain Frequency  Constant    Aggravating Factors   movement    Pain Relieving Factors  ice, pain medication    Effect of Pain on Daily Activities  min effect on ADLs    Multiple Pain Sites  No  [facility] OT Assessment - 11/14/18 1005      Assessment   Medical Diagnosis  right shoulder pain    Referring Provider (OT)  Dr. Sanjuana Kava    Onset Date/Surgical Date  --   approximately 9 months   Hand Dominance  Left    Next MD Visit  11/26/2018    Prior Therapy  None      Precautions   Precautions  None      Restrictions   Weight Bearing Restrictions  No      Balance Screen   Has the patient fallen in the past 6 months  No    Has the patient had a decrease in activity level because of a fear of falling?   No    Is the patient reluctant to leave their home because of a fear of falling?   No      Prior Function   Level of Independence  Independent    Vocation  On disability    Leisure  washing cars, spending time with 66 year old baby      ADL   ADL comments  Pt is having difficulty with sleeping, reaching overhead and behind back, lifting baby (74 year old), driving      Written Expression   Dominant  Hand  Left      Cognition   Overall Cognitive Status  Within Functional Limits for tasks assessed      Observation/Other Assessments   Focus on Therapeutic Outcomes (FOTO)   56/100      ROM / Strength   AROM / PROM / Strength  AROM;PROM;Strength      Palpation   Palpation comment  Moderate fascial restrictions in anterior deltoid, trapezius, and scapular regions      AROM   Overall AROM Comments  Assessed seated, er/IR adducted    AROM Assessment Site  Shoulder    Right/Left Shoulder  Right    Right Shoulder Flexion  129 Degrees    Right Shoulder ABduction  76 Degrees    Right Shoulder Internal Rotation  90 Degrees    Right Shoulder External Rotation  46 Degrees      PROM   Overall PROM Comments  Assessed supine, er/IR adducted    PROM Assessment Site  Shoulder    Right/Left Shoulder  Right    Right Shoulder Flexion  155 Degrees    Right Shoulder ABduction  160 Degrees    Right Shoulder Internal Rotation  90 Degrees    Right Shoulder External Rotation  65 Degrees      Strength   Overall Strength Comments  Assessed seated, er/IR adducted    Strength Assessment Site  Shoulder    Right/Left Shoulder  Right    Right Shoulder Flexion  3/5    Right Shoulder ABduction  3-/5    Right Shoulder Internal Rotation  4-/5    Right Shoulder External Rotation  3/5                      OT Education - 11/14/18 1031    Education Details  shoulder stretches    Person(s) Educated  Patient    Methods  Explanation;Demonstration;Handout    Comprehension  Verbalized understanding;Returned demonstration       OT Short Term Goals - 11/14/18 1050      OT SHORT TERM GOAL #1   Title  Pt will be provided with and educated on HEP to improve ability to use RUE as  non-dominant during daily tasks.    Time  4    Period  Weeks    Status  New    Target Date  12/14/18      OT SHORT TERM GOAL #2   Title  Pt will decrease pain in RUE to 4/10 or less to improve ability to sleep for  a minimum of 3 consecutive hours.    Time  4    Period  Weeks    Status  New      OT SHORT TERM GOAL #3   Title  Pt will increase A/ROM to WNL to improve ability to perform overhead reaching tasks.    Time  4    Period  Weeks    Status  New      OT SHORT TERM GOAL #4   Title  Pt will increase RUE strength to 4+/5 or greater to improve ability to perform car washing tasks using RUE.    Time  4    Period  Weeks    Status  New      OT SHORT TERM GOAL #5   Title  Pt will decrease fascial restrictions in RUE to minimal amounts to improve mobility required for functional reaching tasks.    Time  4    Period  Weeks    Status  New               Plan - 11/14/18 1045    Clinical Impression Statement  A: Pt is a 58 y/o male presenting with right shoulder pain of unknown origin causing difficulty performing functional tasks. Pt reports he is unable to use RUE for lifting tasks, however washes cars daily. During evaluation pt presenting at 50% ROM however is able to perform full ROM for shoulder stretches with minimal difficulty.    OT Occupational Profile and History  Problem Focused Assessment - Including review of records relating to presenting problem    Occupational performance deficits (Please refer to evaluation for details):  ADL's;IADL's;Rest and Sleep;Work;Leisure    Body Structure / Function / Physical Skills  ADL;UE functional use;Fascial restriction;Pain;IADL;Strength;Flexibility    Rehab Potential  Good    Clinical Decision Making  Limited treatment options, no task modification necessary    Comorbidities Affecting Occupational Performance:  None    Modification or Assistance to Complete Evaluation   No modification of tasks or assist necessary to complete eval    OT Frequency  2x / week    OT Duration  4 weeks    OT Treatment/Interventions  Self-care/ADL training;Ultrasound;Patient/family education;Passive range of motion;Cryotherapy;Electrical Stimulation;Moist  Heat;Therapeutic exercise;Manual Therapy;Therapeutic activities    Plan  P: Pt will benefit from skilled OT services to decrease pain and increase ROM, strength, and functional use of RUE during ADLs. Treatment plan: myofascial release, manual techniques, P/ROM, A/ROM, general RUE strengthening, scapular stability and strengthening    Consulted and Agree with Plan of Care  Patient       Patient will benefit from skilled therapeutic intervention in order to improve the following deficits and impairments:   Body Structure / Function / Physical Skills: ADL, UE functional use, Fascial restriction, Pain, IADL, Strength, Flexibility       Visit Diagnosis: 1. Chronic right shoulder pain   2. Other symptoms and signs involving the musculoskeletal system       Problem List Patient Active Problem List   Diagnosis Date Noted  . Obesity 08/09/2018  . Chronic kidney disease, stage 2 (mild) 06/25/2018  .  Hypercalcemia 06/25/2018  . Abdominal pain 06/11/2017  . H/O Spinal surgery 01/24/2017  . Pain in left leg 01/24/2017  . Radicular pain 01/24/2017  . Numbness 11/24/2015  . Chronic lumbar radiculopathy 11/24/2015  . FULL INCONTINENCE OF FECES 05/09/2010  . DIARRHEA 05/09/2010  . TOBACCO ABUSE 03/27/2008  . ALLERGIC RHINITIS 07/18/2007  . INSOMNIA 07/18/2007  . SARCOIDOSIS 05/09/2007  . ANXIETY 05/09/2007  . DEPRESSION 05/09/2007  . GERD 05/09/2007  . ARTHRITIS 05/09/2007  . LOW BACK PAIN 05/09/2007  . MIGRAINES, HX OF 05/09/2007  . Personal history of other specified conditions 05/09/2007   Guadelupe Sabin, OTR/L  [phone] 11/14/2018, 10:54 AM  [facility] [address] [place], [place], [address] Phone: [phone]   Fax:  [phone]  Name: Michael [name] MRN: [id number] Date of Birth: [birth year]

## 2018-11-14 NOTE — Addendum Note (Signed)
Addended by: Guadelupe Sabin A on: 11/14/2018 10:55 AM   Modules accepted: Orders

## 2018-11-18 ENCOUNTER — Ambulatory Visit (HOSPITAL_COMMUNITY): Payer: Medicare Other

## 2018-11-18 ENCOUNTER — Encounter (HOSPITAL_COMMUNITY): Payer: Self-pay

## 2018-11-18 ENCOUNTER — Other Ambulatory Visit: Payer: Self-pay

## 2018-11-18 DIAGNOSIS — G8929 Other chronic pain: Secondary | ICD-10-CM

## 2018-11-18 DIAGNOSIS — M25511 Pain in right shoulder: Secondary | ICD-10-CM | POA: Diagnosis not present

## 2018-11-18 DIAGNOSIS — R29898 Other symptoms and signs involving the musculoskeletal system: Secondary | ICD-10-CM | POA: Diagnosis not present

## 2018-11-18 NOTE — Therapy (Signed)
[facility] [address] [place], [place], [address] Phone: [phone]   Fax:  [phone]  Occupational Therapy Treatment  Patient Details  Name: Michael [name] MRN: [id number] Date of Birth: [birth year] Referring Provider (OT): Dr. Sanjuana Kava   Encounter Date: 11/18/2018  OT End of Session - 11/18/18 1532    Visit Number  2    Number of Visits  8    Date for OT Re-Evaluation  12/14/18    Authorization Type  1) UHC Medicare  2) Medicaid    Authorization Time Period  visits based on medical necessity    OT Start Time  1433    OT Stop Time  1511    OT Time Calculation (min)  38 min    Activity Tolerance  Patient tolerated treatment well    Behavior During Therapy  [facility] for tasks assessed/performed       Past Medical History:  Diagnosis Date  . Allergic rhinitis   . Arthritis   . Chronic low back pain    follwed in Pain Clinic(Dr.Kirchmayer)  . Depression   . Depression with anxiety   . GERD (gastroesophageal reflux disease)   . History of kidney stones    passed  . Insomnia   . Migraines   . Sarcoidosis    Multisystem,pulmonary and hepatic (Dr.Rourk)Liver bx in 06 mildly postive AMA but on re-check normal    Past Surgical History:  Procedure Laterality Date  . BACK SURGERY  2012   2nd back surgery 3 monthes ago and in 2007  . COLONOSCOPY  11/05/2003   IRJ:[id number] internal hemorrhoids, otherwise normal rectum and colon  . ESOPHAGOGASTRODUODENOSCOPY  03/22/2004   [id number] esophagogastroduodenoscopy. Small hiatal hernia otherwise normal stomach  . HARVEST BONE GRAFT Left    thigh  . HIP ARTHROPLASTY Bilateral    left 2017, right 2016  . HIP SURGERY Left 2017   bone graft  . LAMINECTOMY  2009   Balateral L4-L5  . TOOTH EXTRACTION Bilateral 12/28/2017   Procedure: DENTAL RESTORATION/EXTRACTIONS;  Surgeon: Diona Browner, DDS;  Location: [place];  Service: Oral Surgery;  Laterality: Bilateral;    There were no vitals  filed for this visit.      [facility] OT Assessment - 11/18/18 1457      Assessment   Medical Diagnosis  right shoulder pain      Precautions   Precautions  None               OT Treatments/Exercises (OP) - 11/18/18 1457      Exercises   Exercises  Shoulder      Shoulder Exercises: Supine   Protraction  PROM;5 reps;AROM;10 reps    Horizontal ABduction  PROM;5 reps;AROM;10 reps    External Rotation  PROM;5 reps;AROM;10 reps    Internal Rotation  PROM;5 reps;AROM;10 reps    Flexion  PROM;5 reps;AROM;10 reps    ABduction  PROM;5 reps;AROM;10 reps      Shoulder Exercises: Standing   Protraction  AROM;10 reps    Horizontal ABduction  AROM;10 reps    External Rotation  AROM;10 reps    Internal Rotation  AROM;10 reps    Flexion  AROM;10 reps    ABduction  AROM;10 reps    Extension  Theraband;10 reps    Theraband Level (Shoulder Extension)  Level 3 (Green)    Retraction  Theraband;10 reps    Theraband Level (Shoulder Retraction)  Level 3 (Green)      Shoulder Exercises: ROM/Strengthening  X to V Arms  10X    Proximal Shoulder Strengthening, Seated  A/ROM 10X no rest breaks    Other ROM/Strengthening Exercises  proximal shoulder strengthening with washcloth flexion 1' abduction1'      Manual Therapy   Manual Therapy  Myofascial release    Manual therapy comments  Manual therapy completed completed prior to exercises.     Myofascial Release  Myofascial release completed to right upper arm, scapularis and upper trapezius region.              OT Education - 11/18/18 1531    Education Details  reviewed therapy goals. Discussed lifestyle adjustments and awareness of activity modifications.    Person(s) Educated  Patient    Methods  Explanation    Comprehension  Verbalized understanding       OT Short Term Goals - 11/18/18 1602      OT SHORT TERM GOAL #1   Title  Pt will be provided with and educated on HEP to improve ability to use RUE as non-dominant during  daily tasks.    Time  4    Period  Weeks    Status  On-going    Target Date  12/14/18      OT SHORT TERM GOAL #2   Title  Pt will decrease pain in RUE to 4/10 or less to improve ability to sleep for a minimum of 3 consecutive hours.    Time  4    Period  Weeks    Status  On-going      OT SHORT TERM GOAL #3   Title  Pt will increase A/ROM to WNL to improve ability to perform overhead reaching tasks.    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    Time  4    Period  Weeks    Status  On-going      OT SHORT TERM GOAL #5   Title  Pt will decrease fascial restrictions in RUE to minimal amounts to improve mobility required for functional reaching tasks.    Time  4    Period  Weeks    Status  On-going               Plan - 11/18/18 1532    Clinical Impression Statement  A: Patient arrived to therapy with minimal acceptance of possibility of help that therapy could bring to right shoulder pain. Therapist made it very clear of the intentions of therapy and the amount of effort the patient needs to demonstrate to have any possible good outcomes. Patient verbalized understanding. Continous education was provided during session regarding issues that were causing right shoulder pain, reasons why previous efforts did not work (they focused on the symptoms and not the problem), and the need to make lifestyle adjustments and activity modifications to allow for healing.    Body Structure / Function / Physical Skills  ADL;UE functional use;Fascial restriction;Pain;IADL;Strength;Flexibility    Plan  P: Continue with myofascial release and manual stretching. Continue with scapular theraband stengthening while focusing on form. Add retraction and extension to HEP (blue band?) Assess for shoulder impingement.    Consulted and Agree with Plan of Care   Patient       Patient will benefit from  skilled therapeutic intervention in order to improve the following deficits and impairments:   Body Structure / Function / Physical Skills: ADL, UE functional use, Fascial restriction, Pain, IADL, Strength, Flexibility       Visit Diagnosis: 1. Chronic right shoulder pain   2. Other symptoms and signs involving the musculoskeletal system       Problem List Patient Active Problem List   Diagnosis Date Noted  . Obesity 08/09/2018  . Chronic kidney disease, stage 2 (mild) 06/25/2018  . Hypercalcemia 06/25/2018  . Abdominal pain 06/11/2017  . H/O Spinal surgery 01/24/2017  . Pain in left leg 01/24/2017  . Radicular pain 01/24/2017  . Numbness 11/24/2015  . Chronic lumbar radiculopathy 11/24/2015  . FULL INCONTINENCE OF FECES 05/09/2010  . DIARRHEA 05/09/2010  . TOBACCO ABUSE 03/27/2008  . ALLERGIC RHINITIS 07/18/2007  . INSOMNIA 07/18/2007  . SARCOIDOSIS 05/09/2007  . ANXIETY 05/09/2007  . DEPRESSION 05/09/2007  . GERD 05/09/2007  . ARTHRITIS 05/09/2007  . LOW BACK PAIN 05/09/2007  . MIGRAINES, HX OF 05/09/2007  . Personal history of other specified conditions 05/09/2007   Ailene Ravel, OTR/L,CBIS  [phone]  11/18/2018, 4:03 PM  [facility] [address] [place], [place], [address] Phone: [phone]   Fax:  [phone]  Name: Michael [name] MRN: [id number] Date of Birth: April 24, 1960

## 2018-11-21 ENCOUNTER — Encounter (HOSPITAL_COMMUNITY): Payer: Self-pay

## 2018-11-21 ENCOUNTER — Ambulatory Visit (HOSPITAL_COMMUNITY): Payer: Medicare Other

## 2018-11-21 ENCOUNTER — Other Ambulatory Visit: Payer: Self-pay

## 2018-11-21 DIAGNOSIS — G8929 Other chronic pain: Secondary | ICD-10-CM | POA: Diagnosis not present

## 2018-11-21 DIAGNOSIS — M25511 Pain in right shoulder: Secondary | ICD-10-CM

## 2018-11-21 DIAGNOSIS — R29898 Other symptoms and signs involving the musculoskeletal system: Secondary | ICD-10-CM | POA: Diagnosis not present

## 2018-11-21 NOTE — Therapy (Signed)
[facility] [address] [place], [place], [address] Phone: [phone]   Fax:  [phone]  Occupational Therapy Treatment  Patient Details  Name: Michael [name] MRN: [id number] Date of Birth: 03/27/1961 Referring Provider (OT): Dr. Sanjuana Kava   Encounter Date: 11/21/2018  OT End of Session - 11/21/18 1536    Visit Number  3    Number of Visits  8    Date for OT Re-Evaluation  12/14/18    Authorization Type  1) UHC Medicare  2) Medicaid    Authorization Time Period  visits based on medical necessity    OT Start Time  1432    OT Stop Time  1515    OT Time Calculation (min)  43 min    Activity Tolerance  Patient tolerated treatment well    Behavior During Therapy  [facility] for tasks assessed/performed       Past Medical History:  Diagnosis Date  . Allergic rhinitis   . Arthritis   . Chronic low back pain    follwed in Pain Clinic(Dr.Kirchmayer)  . Depression   . Depression with anxiety   . GERD (gastroesophageal reflux disease)   . History of kidney stones    passed  . Insomnia   . Migraines   . Sarcoidosis    Multisystem,pulmonary and hepatic (Dr.Rourk)Liver bx in 06 mildly postive AMA but on re-check normal    Past Surgical History:  Procedure Laterality Date  . BACK SURGERY  2012   2nd back surgery 3 monthes ago and in 2007  . COLONOSCOPY  11/05/2003   AGT:[id number] internal hemorrhoids, otherwise normal rectum and colon  . ESOPHAGOGASTRODUODENOSCOPY  03/22/2004   OZY:[id number] esophagogastroduodenoscopy. Small hiatal hernia otherwise normal stomach  . HARVEST BONE GRAFT Left    thigh  . HIP ARTHROPLASTY Bilateral    left 2017, right 2016  . HIP SURGERY Left 2017   bone graft  . LAMINECTOMY  2009   Balateral L4-L5  . TOOTH EXTRACTION Bilateral 12/28/2017   Procedure: DENTAL RESTORATION/EXTRACTIONS;  Surgeon: Diona Browner, DDS;  Location: [facility];  Service: Oral Surgery;  Laterality: Bilateral;    There were no vitals  filed for this visit.  Subjective Assessment - 11/21/18 1450    Subjective   S: I was sore after last session. I had to take a cool shower and I didn't go anywhere for a few hours.    Currently in Pain?  Yes    Pain Score  8     Pain Location  Shoulder    Pain Orientation  Right    Pain Descriptors / Indicators  Aching;Stabbing    Pain Type  Chronic pain    Pain Radiating Towards  N/A    Pain Onset  More than a month ago    Pain Frequency  Constant    Aggravating Factors   movement, exercises    Pain Relieving Factors  ice, pain medication    Effect of Pain on Daily Activities  min effect. Patient pushes through    Multiple Pain Sites  No         [facility] OT Assessment - 11/21/18 1453      Assessment   Medical Diagnosis  right shoulder pain      Precautions   Precautions  None      Observation/Other Assessments   Observations  + hawkins test, empty can test, and Neers test  OT Treatments/Exercises (OP) - 11/21/18 1453      Exercises   Exercises  Shoulder      Shoulder Exercises: Supine   Protraction  PROM;5 reps;AROM;10 reps    Horizontal ABduction  PROM;5 reps;AROM;10 reps    External Rotation  PROM;5 reps;AROM;10 reps    Internal Rotation  PROM;5 reps;AROM;10 reps    Flexion  PROM;5 reps;AROM;10 reps    ABduction  PROM;5 reps;AROM;10 reps      Shoulder Exercises: Standing   Protraction  AROM;10 reps    Horizontal ABduction  AROM;10 reps    External Rotation  AROM;10 reps    Internal Rotation  AROM;10 reps    Flexion  AROM;10 reps    ABduction  AROM;10 reps    Extension  Theraband;10 reps    Theraband Level (Shoulder Extension)  Level 4 (Blue)    Retraction  Theraband;10 reps    Theraband Level (Shoulder Retraction)  Level 4 (Blue)      Shoulder Exercises: ROM/Strengthening   X to V Arms  10X    Proximal Shoulder Strengthening, Seated  A/ROM 10X no rest breaks    Other ROM/Strengthening Exercises  proximal shoulder strengthening with  washcloth flexion 1' abduction1'    Other ROM/Strengthening Exercises  Isometric dynamic shoulder flexion stretch using countertop; 5X      Shoulder Exercises: Stretch   Other Shoulder Stretches  Doorway stretch; 2x20"      Manual Therapy   Manual Therapy  Myofascial release    Manual therapy comments  Manual therapy completed completed prior to exercises.     Myofascial Release  Myofascial release completed to right upper arm, scapularis and upper trapezius region.                OT Short Term Goals - 11/18/18 1602      OT SHORT TERM GOAL #1   Title  Pt will be provided with and educated on HEP to improve ability to use RUE as non-dominant during daily tasks.    Time  4    Period  Weeks    Status  On-going    Target Date  12/14/18      OT SHORT TERM GOAL #2   Title  Pt will decrease pain in RUE to 4/10 or less to improve ability to sleep for a minimum of 3 consecutive hours.    Time  4    Period  Weeks    Status  On-going      OT SHORT TERM GOAL #3   Title  Pt will increase A/ROM to WNL to improve ability to perform overhead reaching tasks.    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    Time  4    Period  Weeks    Status  On-going      OT SHORT TERM GOAL #5   Title  Pt will decrease fascial restrictions in RUE to minimal amounts to improve mobility required for functional reaching tasks.    Time  4    Period  Weeks    Status  On-going               Plan - 11/21/18 1624    Clinical Impression Statement  A: Patient demonstrated a positive attitude  during session with acceptance of all education. Continued to have difficulty wiht proper form when completing scapular strengthening using the blue band during retraction. Was able to complete proximal shoulder strengthening with increased form and technique.  Patient had full passive ROM during stretching. Continued to have moderate fascial restrictions in the [facility] joint region with manual techniques completed to address. Patient inquired about receiving a copy of his therapy report for his lawyer. Therapist provided him with the phone number and fax number to medical records in [place].    Body Structure / Function / Physical Skills  ADL;UE functional use;Fascial restriction;Pain;IADL;Strength;Flexibility    Plan  P: Continue with myofascial release to right AC joint region. Work on independence with demonstrating proper form for retraction using blue theraband.    Consulted and Agree with Plan of Care  Patient       Patient will benefit from skilled therapeutic intervention in order to improve the following deficits and impairments:   Body Structure / Function / Physical Skills: ADL, UE functional use, Fascial restriction, Pain, IADL, Strength, Flexibility       Visit Diagnosis: 1. Other symptoms and signs involving the musculoskeletal system   2. Chronic right shoulder pain       Problem List Patient Active Problem List   Diagnosis Date Noted  . Obesity 08/09/2018  . Chronic kidney disease, stage 2 (mild) 06/25/2018  . Hypercalcemia 06/25/2018  . Abdominal pain 06/11/2017  . H/O Spinal surgery 01/24/2017  . Pain in left leg 01/24/2017  . Radicular pain 01/24/2017  . Numbness 11/24/2015  . Chronic lumbar radiculopathy 11/24/2015  . FULL INCONTINENCE OF FECES 05/09/2010  . DIARRHEA 05/09/2010  . TOBACCO ABUSE 03/27/2008  . ALLERGIC RHINITIS 07/18/2007  . INSOMNIA 07/18/2007  . SARCOIDOSIS 05/09/2007  . ANXIETY 05/09/2007  . DEPRESSION 05/09/2007  . GERD 05/09/2007  . ARTHRITIS 05/09/2007  . LOW BACK PAIN 05/09/2007  . MIGRAINES, HX OF 05/09/2007  . Personal history of other specified conditions 05/09/2007   Ailene Ravel, OTR/L,CBIS  [phone]  11/21/2018, 4:30 PM  [facility] [place], [place], [address] Phone: [phone]   Fax:  [phone]  Name: Michael [name] MRN: [id number] Date of Birth: 1960/12/18

## 2018-11-25 ENCOUNTER — Encounter (HOSPITAL_COMMUNITY): Payer: Self-pay | Admitting: Specialist

## 2018-11-25 ENCOUNTER — Other Ambulatory Visit: Payer: Self-pay

## 2018-11-25 ENCOUNTER — Ambulatory Visit (HOSPITAL_COMMUNITY): Payer: Medicare Other | Admitting: Specialist

## 2018-11-25 DIAGNOSIS — G8929 Other chronic pain: Secondary | ICD-10-CM | POA: Diagnosis not present

## 2018-11-25 DIAGNOSIS — M25511 Pain in right shoulder: Secondary | ICD-10-CM

## 2018-11-25 DIAGNOSIS — R29898 Other symptoms and signs involving the musculoskeletal system: Secondary | ICD-10-CM | POA: Diagnosis not present

## 2018-11-25 NOTE — Therapy (Signed)
[facility] [address] [place], [place], [address] Phone: [phone]   Fax:  [phone]  Occupational Therapy Treatment  Patient Details  Name: Michael [name] MRN: [id number] Date of Birth: 14-Sep-1960 Referring Provider (OT): Dr. Sanjuana Kava   Encounter Date: 11/25/2018  OT End of Session - 11/25/18 1506    Visit Number  4    Number of Visits  8    Date for OT Re-Evaluation  12/14/18    Authorization Type  1) UHC Medicare  2) Medicaid    Authorization Time Period  visits based on medical necessity    OT Start Time  1415    OT Stop Time  1454    OT Time Calculation (min)  39 min    Activity Tolerance  Patient tolerated treatment well    Behavior During Therapy  [facility] for tasks assessed/performed       Past Medical History:  Diagnosis Date  . Allergic rhinitis   . Arthritis   . Chronic low back pain    follwed in Pain Clinic(Dr.Kirchmayer)  . Depression   . Depression with anxiety   . GERD (gastroesophageal reflux disease)   . History of kidney stones    passed  . Insomnia   . Migraines   . Sarcoidosis    Multisystem,pulmonary and hepatic (Dr.Rourk)Liver bx in 06 mildly postive AMA but on re-check normal    Past Surgical History:  Procedure Laterality Date  . BACK SURGERY  2012   2nd back surgery 3 monthes ago and in 2007  . COLONOSCOPY  11/05/2003   PIR:JJOACZY internal hemorrhoids, otherwise normal rectum and colon  . ESOPHAGOGASTRODUODENOSCOPY  03/22/2004   SAY:[id number] esophagogastroduodenoscopy. Small hiatal hernia otherwise normal stomach  . HARVEST BONE GRAFT Left    thigh  . HIP ARTHROPLASTY Bilateral    left 2017, right 2016  . HIP SURGERY Left 2017   bone graft  . LAMINECTOMY  2009   Balateral L4-L5  . TOOTH EXTRACTION Bilateral 12/28/2017   Procedure: DENTAL RESTORATION/EXTRACTIONS;  Surgeon: Diona Browner, DDS;  Location: [facility];  Service: Oral Surgery;  Laterality: Bilateral;    There were no vitals  filed for this visit.  Subjective Assessment - 11/25/18 1505    Subjective   S:  I have been in pain my entire life.  I just want to get this shoulder stronger.    Currently in Pain?  Yes    Pain Score  9     Pain Location  Shoulder    Pain Orientation  Right    Pain Descriptors / Indicators  Aching    Pain Type  Acute pain    Aggravating Factors   detailing cars all morning         [facility] OT Assessment - 11/25/18 0001      Assessment   Medical Diagnosis  right shoulder pain      Precautions   Precautions  None               OT Treatments/Exercises (OP) - 11/25/18 0001      Shoulder Exercises: Supine   Protraction  PROM;5 reps;Strengthening;10 reps    Protraction Weight (lbs)  1    Horizontal ABduction  PROM;5 reps;Strengthening;10 reps    Horizontal ABduction Weight (lbs)  1    External Rotation  PROM;5 reps;Strengthening;10 reps    External Rotation Weight (lbs)  1    Internal Rotation  PROM;5 reps;Strengthening;10 reps    Internal Rotation  Weight (lbs)  1    Flexion  PROM;5 reps;Strengthening;10 reps    Shoulder Flexion Weight (lbs)  1    ABduction  PROM;5 reps;Strengthening;10 reps    Shoulder ABduction Weight (lbs)  1      Shoulder Exercises: Standing   Horizontal ABduction  Theraband;10 reps    Theraband Level (Shoulder Horizontal ABduction)  Level 4 (Blue)    External Rotation  Theraband;10 reps    Theraband Level (Shoulder External Rotation)  Level 4 (Blue)    Internal Rotation  Theraband;10 reps    Theraband Level (Shoulder Internal Rotation)  Level 4 (Blue)    Extension  Theraband;10 reps    Theraband Level (Shoulder Extension)  Level 4 (Blue)    Retraction  Theraband;10 reps    Theraband Level (Shoulder Retraction)  Level 4 (Blue)      Shoulder Exercises: ROM/Strengthening   UBE (Upper Arm Bike)  3' forward and 3' reverse at 1.0 at 5.0 speed with vg for control    Proximal Shoulder Strengthening, Supine  10 times each with 1# resistance with  moderate vg for flow, accuracy, form     Ball on Wall  1' with arm flexed to 90 and 1' with arm abducted to 90 with mod vg for technique and form       Manual Therapy   Manual Therapy  Myofascial release    Manual therapy comments  Manual therapy completed completed prior to exercises.     Myofascial Release  Myofascial release completed to right upper arm, scapularis and upper trapezius region.                OT Short Term Goals - 11/18/18 1602      OT SHORT TERM GOAL #1   Title  Pt will be provided with and educated on HEP to improve ability to use RUE as non-dominant during daily tasks.    Time  4    Period  Weeks    Status  On-going    Target Date  12/14/18      OT SHORT TERM GOAL #2   Title  Pt will decrease pain in RUE to 4/10 or less to improve ability to sleep for a minimum of 3 consecutive hours.    Time  4    Period  Weeks    Status  On-going      OT SHORT TERM GOAL #3   Title  Pt will increase A/ROM to WNL to improve ability to perform overhead reaching tasks.    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    Time  4    Period  Weeks    Status  On-going      OT SHORT TERM GOAL #5   Title  Pt will decrease fascial restrictions in RUE to minimal amounts to improve mobility required for functional reaching tasks.    Time  4    Period  Weeks    Status  On-going               Plan - 11/25/18 1506    Clinical Impression Statement  A:  Patient with tenderness upon palpation in right scapular region.  patient has full p/arom in supine and seated position, however range is painful.  moderate restrictions noted in ac joint.  initiated strengthening this date, focusing on proximal shoulder stability.    Body Structure / Function / Physical Skills  ADL;UE functional use;Fascial  restriction;Pain;IADL;Strength;Flexibility    Plan  P:  increase to 2# resist in standing and prone.  increase to level 2 on ube. increase form and accuracy with ball on the wall exercise for shoulder stability.       Patient will benefit from skilled therapeutic intervention in order to improve the following deficits and impairments:   Body Structure / Function / Physical Skills: ADL, UE functional use, Fascial restriction, Pain, IADL, Strength, Flexibility       Visit Diagnosis: 1. Chronic right shoulder pain   2. Other symptoms and signs involving the musculoskeletal system       Problem List Patient Active Problem List   Diagnosis Date Noted  . Obesity 08/09/2018  . Chronic kidney disease, stage 2 (mild) 06/25/2018  . Hypercalcemia 06/25/2018  . Abdominal pain 06/11/2017  . H/O Spinal surgery 01/24/2017  . Pain in left leg 01/24/2017  . Radicular pain 01/24/2017  . Numbness 11/24/2015  . Chronic lumbar radiculopathy 11/24/2015  . FULL INCONTINENCE OF FECES 05/09/2010  . DIARRHEA 05/09/2010  . TOBACCO ABUSE 03/27/2008  . ALLERGIC RHINITIS 07/18/2007  . INSOMNIA 07/18/2007  . SARCOIDOSIS 05/09/2007  . ANXIETY 05/09/2007  . DEPRESSION 05/09/2007  . GERD 05/09/2007  . ARTHRITIS 05/09/2007  . LOW BACK PAIN 05/09/2007  . MIGRAINES, HX OF 05/09/2007  . Personal history of other specified conditions 05/09/2007    Vangie Bicker, , OTR/L [phone]  11/25/2018, 3:13 PM  [facility] [facility] [address] [place], [place], [address] Phone: [phone]   Fax:  [phone]  Name: Michael [name] MRN: [id number] Date of Birth: December 26, 1960

## 2018-11-26 ENCOUNTER — Encounter: Payer: Self-pay | Admitting: Orthopaedic Surgery

## 2018-11-26 ENCOUNTER — Ambulatory Visit (INDEPENDENT_AMBULATORY_CARE_PROVIDER_SITE_OTHER): Payer: Medicare Other | Admitting: Orthopaedic Surgery

## 2018-11-26 VITALS — BP 134/84 | HR 77 | Temp 97.5°F | Ht 74.0 in | Wt 218.0 lb

## 2018-11-26 DIAGNOSIS — M25511 Pain in right shoulder: Secondary | ICD-10-CM

## 2018-11-26 DIAGNOSIS — F17210 Nicotine dependence, cigarettes, uncomplicated: Secondary | ICD-10-CM

## 2018-11-26 DIAGNOSIS — G8929 Other chronic pain: Secondary | ICD-10-CM | POA: Diagnosis not present

## 2018-11-26 NOTE — Progress Notes (Signed)
Patient ID:Michael [name], male DOB:November 08, 1960, 58 y.o. VFI:[id number]  Chief Complaint  Patient presents with  . Shoulder Pain    Right shoulder pain.    HPI  Michael [name] is a 58 y.o. male who has chronic right shoulder pain.  MRI showed tendinitis and bursitis no tear. He has been going to OT.  I have reviewed their notes.  He is making slow progress.  He still has some pain.  He has no new trauma, no numbness.  He cannot take NSAIDs secondary to sarcoidosis.     Body mass index is 27.99 kg/m.  ROS  Review of Systems  Constitutional: Positive for activity change.  Musculoskeletal: Positive for arthralgias and back pain.  All other systems reviewed and are negative.   All other systems reviewed and are negative.  The following is a summary of the past history medically, past history surgically, known current medicines, social history and family history.  This information is gathered electronically by the computer from prior information and documentation.  I review this each visit and have found including this information at this point in the chart is beneficial and informative.    Past Medical History:  Diagnosis Date  . Allergic rhinitis   . Arthritis   . Chronic low back pain    follwed in Pain Clinic(Dr.Kirchmayer)  . Depression   . Depression with anxiety   . GERD (gastroesophageal reflux disease)   . History of kidney stones    passed  . Insomnia   . Migraines   . Sarcoidosis    Multisystem,pulmonary and hepatic (Dr.Rourk)Liver bx in 06 mildly postive AMA but on re-check normal    Past Surgical History:  Procedure Laterality Date  . BACK SURGERY  2012   2nd back surgery 3 monthes ago and in 2007  . COLONOSCOPY  11/05/2003   CZY:SAYTKZS internal hemorrhoids, otherwise normal rectum and colon  . ESOPHAGOGASTRODUODENOSCOPY  03/22/2004   [facility]:XNATFT esophagogastroduodenoscopy. Small hiatal hernia otherwise normal stomach  . HARVEST BONE GRAFT Left    thigh  .  HIP ARTHROPLASTY Bilateral    left 2017, right 2016  . HIP SURGERY Left 2017   bone graft  . LAMINECTOMY  2009   Balateral L4-L5  . TOOTH EXTRACTION Bilateral 12/28/2017   Procedure: DENTAL RESTORATION/EXTRACTIONS;  Surgeon: Diona Browner, DDS;  Location: [facility];  Service: Oral Surgery;  Laterality: Bilateral;    Family History  Problem Relation Age of Onset  . Colon cancer Brother        in his 59s    Social History Social History   Tobacco Use  . Smoking status: Current Every Day Smoker    Packs/day: 0.50    Years: 29.00    Pack years: 14.50    Types: Cigarettes  . Smokeless tobacco: Never Used  Substance Use Topics  . Alcohol use: Yes    Comment: ocassional  . Drug use: No    Allergies  Allergen Reactions  . Aspirin Other (See Comments)    REACTION: "Reacts with sarcoidosis"  . Methadone Other (See Comments)    REACTION: "Become mentally unstable"    Current Outpatient Medications  Medication Sig Dispense Refill  . albuterol (PROVENTIL) (2.5 MG/3ML) 0.083% nebulizer solution Take 2.5 mg by nebulization daily.     Marland Kitchen amoxicillin (AMOXIL) 500 MG capsule Take 1 capsule (500 mg total) by mouth 3 (three) times daily. (Patient not taking: Reported on 11/05/2018) 21 capsule 0  . Aspirin-Acetaminophen-Caffeine (GOODY HEADACHE PO) Take 1 packet  by mouth daily as needed (for headache).     . Oxycodone HCl 10 MG TABS Take 10 mg by mouth 4 (four) times daily.     . pantoprazole (PROTONIX) 40 MG tablet Take 1 tablet (40 mg total) by mouth daily. Take 30 minutes before breakfast 90 tablet 3  . pregabalin (LYRICA) 150 MG capsule Take 150 mg by mouth 3 (three) times daily.     Marland Kitchen tiZANidine (ZANAFLEX) 4 MG tablet Take 4 mg by mouth 2 (two) times daily.   2  . traMADol (ULTRAM) 50 MG tablet Take 1 tablet (50 mg total) by mouth every 6 (six) hours as needed. 20 tablet 0  . XTAMPZA ER 9 MG C12A Take 9 mg by mouth 2 (two) times daily.   0   No current facility-administered medications  for this visit.      Physical Exam  Blood pressure 134/84, pulse 77, temperature (!) 97.5 F (36.4 C), height 6\' 2"  (1.88 m), weight 218 lb (98.9 kg).  Constitutional: overall normal hygiene, normal nutrition, well developed, normal grooming, normal body habitus. Assistive device:none  Musculoskeletal: gait and station Limp none, muscle tone and strength are normal, no tremors or atrophy is present.  .  Neurological: coordination overall normal.  Deep tendon reflex/nerve stretch intact.  Sensation normal.  Cranial nerves II-XII intact.   Skin:   Normal overall no scars, lesions, ulcers or rashes. No psoriasis.  Psychiatric: Alert and oriented x 3.  Recent memory intact, remote memory unclear.  Normal mood and affect. Well groomed.  Good eye contact.  Cardiovascular: overall no swelling, no varicosities, no edema bilaterally, normal temperatures of the legs and arms, no clubbing, cyanosis and good capillary refill.  Lymphatic: palpation is normal.  Examination of right Upper Extremity is done.  Inspection:   Overall:  Elbow non-tender without crepitus or defects, forearm non-tender without crepitus or defects, wrist non-tender without crepitus or defects, hand non-tender.    Shoulder: with glenohumeral joint tenderness, without effusion.   Upper arm:  without swelling and tenderness   Range of motion:   Overall:  Full range of motion of the elbow, full range of motion of wrist and full range of motion in fingers.   Shoulder:  right  160 degrees forward flexion; 145 degrees abduction; 30 degrees internal rotation, 30 degrees external rotation, 10 degrees extension, 40 degrees adduction.   Stability:   Overall:  Shoulder, elbow and wrist stable   Strength and Tone:   Overall full shoulder muscles strength, full upper arm strength and normal upper arm bulk and tone.  All other systems reviewed and are negative   The patient has been educated about the nature of the problem(s)  and counseled on treatment options.  The patient appeared to understand what I have discussed and is in agreement with it.  Encounter Diagnoses  Name Primary?  . Chronic right shoulder pain Yes  . Nicotine dependence, cigarettes, uncomplicated     PLAN Call if any problems.  Precautions discussed.  Continue current medications.   Return to clinic 3 weeks   Continue OT.  Electronically Signed Sanjuana Kava, MD 8/18/20208:36 AM

## 2018-11-27 ENCOUNTER — Telehealth (HOSPITAL_COMMUNITY): Payer: Self-pay

## 2018-11-27 ENCOUNTER — Ambulatory Visit (HOSPITAL_COMMUNITY): Payer: Medicare Other

## 2018-11-27 DIAGNOSIS — M25551 Pain in right hip: Secondary | ICD-10-CM | POA: Diagnosis not present

## 2018-11-27 DIAGNOSIS — S3992XA Unspecified injury of lower back, initial encounter: Secondary | ICD-10-CM | POA: Diagnosis not present

## 2018-11-27 DIAGNOSIS — S299XXA Unspecified injury of thorax, initial encounter: Secondary | ICD-10-CM | POA: Diagnosis not present

## 2018-11-27 DIAGNOSIS — M25519 Pain in unspecified shoulder: Secondary | ICD-10-CM | POA: Diagnosis not present

## 2018-11-27 DIAGNOSIS — S79911A Unspecified injury of right hip, initial encounter: Secondary | ICD-10-CM | POA: Diagnosis not present

## 2018-11-27 DIAGNOSIS — M545 Low back pain: Secondary | ICD-10-CM | POA: Diagnosis not present

## 2018-11-27 DIAGNOSIS — M25552 Pain in left hip: Secondary | ICD-10-CM | POA: Diagnosis not present

## 2018-11-27 DIAGNOSIS — I700 Atherosclerosis of aorta: Secondary | ICD-10-CM | POA: Diagnosis not present

## 2018-11-27 DIAGNOSIS — S199XXA Unspecified injury of neck, initial encounter: Secondary | ICD-10-CM | POA: Diagnosis not present

## 2018-11-27 DIAGNOSIS — I10 Essential (primary) hypertension: Secondary | ICD-10-CM | POA: Diagnosis not present

## 2018-11-27 DIAGNOSIS — R52 Pain, unspecified: Secondary | ICD-10-CM | POA: Diagnosis not present

## 2018-11-27 DIAGNOSIS — Z96643 Presence of artificial hip joint, bilateral: Secondary | ICD-10-CM | POA: Diagnosis not present

## 2018-11-27 DIAGNOSIS — M542 Cervicalgia: Secondary | ICD-10-CM | POA: Diagnosis not present

## 2018-11-27 DIAGNOSIS — S0990XA Unspecified injury of head, initial encounter: Secondary | ICD-10-CM | POA: Diagnosis not present

## 2018-11-27 DIAGNOSIS — K7460 Unspecified cirrhosis of liver: Secondary | ICD-10-CM | POA: Diagnosis not present

## 2018-11-27 DIAGNOSIS — S79912A Unspecified injury of left hip, initial encounter: Secondary | ICD-10-CM | POA: Diagnosis not present
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
## 2018-12-02 ENCOUNTER — Telehealth (HOSPITAL_COMMUNITY): Payer: Self-pay

## 2018-12-02 ENCOUNTER — Ambulatory Visit (HOSPITAL_COMMUNITY): Payer: Medicare Other | Admitting: Occupational Therapy

## 2018-12-02 NOTE — Telephone Encounter (Signed)
Pt called to confirm date and time of next visit.-Friday.

## 2018-12-03 DIAGNOSIS — Z1159 Encounter for screening for other viral diseases: Secondary | ICD-10-CM | POA: Diagnosis not present

## 2018-12-04 DIAGNOSIS — M545 Low back pain: Secondary | ICD-10-CM | POA: Diagnosis not present

## 2018-12-04 DIAGNOSIS — M25511 Pain in right shoulder: Secondary | ICD-10-CM | POA: Diagnosis not present

## 2018-12-06 ENCOUNTER — Ambulatory Visit (HOSPITAL_COMMUNITY): Payer: Medicare Other

## 2018-12-06 ENCOUNTER — Telehealth (HOSPITAL_COMMUNITY): Payer: Self-pay | Admitting: Family

## 2018-12-06 NOTE — Telephone Encounter (Signed)
828/20  pt called at 11:04 to check on his appt time... he thought it was 230... he said that he couldn't make the 1115 appt

## 2018-12-11 ENCOUNTER — Ambulatory Visit (HOSPITAL_COMMUNITY): Payer: Medicare Other | Attending: Orthopaedic Surgery | Admitting: Occupational Therapy

## 2018-12-11 DIAGNOSIS — G8929 Other chronic pain: Secondary | ICD-10-CM | POA: Insufficient documentation

## 2018-12-11 DIAGNOSIS — M25511 Pain in right shoulder: Secondary | ICD-10-CM | POA: Insufficient documentation

## 2018-12-11 DIAGNOSIS — R29898 Other symptoms and signs involving the musculoskeletal system: Secondary | ICD-10-CM | POA: Insufficient documentation

## 2018-12-13 ENCOUNTER — Encounter (HOSPITAL_COMMUNITY): Payer: Self-pay

## 2018-12-13 ENCOUNTER — Other Ambulatory Visit: Payer: Self-pay

## 2018-12-13 ENCOUNTER — Ambulatory Visit (HOSPITAL_COMMUNITY): Payer: Medicare Other

## 2018-12-13 DIAGNOSIS — R29898 Other symptoms and signs involving the musculoskeletal system: Secondary | ICD-10-CM

## 2018-12-13 DIAGNOSIS — G8929 Other chronic pain: Secondary | ICD-10-CM | POA: Diagnosis not present

## 2018-12-13 DIAGNOSIS — M25511 Pain in right shoulder: Secondary | ICD-10-CM | POA: Diagnosis not present

## 2018-12-13 NOTE — Patient Instructions (Signed)
(  Home) Extension: Isometric / Bilateral Arm Retraction - Sitting   Facing anchor, hold hands and elbow at shoulder height, with elbow bent.  Pull arms back to squeeze shoulder blades together. Repeat 15-20 times. 2-3 times a week.   (Clinic) Extension / Flexion (Assist)   Face anchor, pull arms back, keeping elbow straight, and squeze shoulder blades together. Repeat 10-15 times. 2-3 times a week.
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
## 2018-12-17 ENCOUNTER — Encounter: Payer: Self-pay | Admitting: Orthopaedic Surgery

## 2018-12-17 ENCOUNTER — Ambulatory Visit (INDEPENDENT_AMBULATORY_CARE_PROVIDER_SITE_OTHER): Payer: Medicare Other | Admitting: Orthopaedic Surgery

## 2018-12-17 ENCOUNTER — Other Ambulatory Visit: Payer: Self-pay

## 2018-12-17 ENCOUNTER — Ambulatory Visit (HOSPITAL_COMMUNITY): Payer: Medicare Other

## 2018-12-17 VITALS — BP 146/94 | HR 79 | Ht 74.0 in | Wt 219.0 lb

## 2018-12-17 DIAGNOSIS — M25511 Pain in right shoulder: Secondary | ICD-10-CM

## 2018-12-17 DIAGNOSIS — G8929 Other chronic pain: Secondary | ICD-10-CM

## 2018-12-17 DIAGNOSIS — F17210 Nicotine dependence, cigarettes, uncomplicated: Secondary | ICD-10-CM

## 2018-12-17 NOTE — Patient Instructions (Signed)
Steps to Quit Smoking Smoking tobacco is the leading cause of preventable death. It can affect almost every organ in the body. Smoking puts you and people around you at risk for many serious, long-lasting (chronic) diseases. Quitting smoking can be hard, but it is one of the best things that you can do for your health. It is never too late to quit. How do I get ready to quit? When you decide to quit smoking, make a plan to help you succeed. Before you quit:  Pick a date to quit. Set a date within the next 2 weeks to give you time to prepare.  Write down the reasons why you are quitting. Keep this list in places where you will see it often.  Tell your family, friends, and co-workers that you are quitting. Their support is important.  Talk with your doctor about the choices that may help you quit.  Find out if your health insurance will pay for these treatments.  Know the people, places, things, and activities that make you want to smoke (triggers). Avoid them. What first steps can I take to quit smoking?  Throw away all cigarettes at home, at work, and in your car.  Throw away the things that you use when you smoke, such as ashtrays and lighters.  Clean your car. Make sure to empty the ashtray.  Clean your home, including curtains and carpets. What can I do to help me quit smoking? Talk with your doctor about taking medicines and seeing a counselor at the same time. You are more likely to succeed when you do both.  If you are pregnant or breastfeeding, talk with your doctor about counseling or other ways to quit smoking. Do not take medicine to help you quit smoking unless your doctor tells you to do so. To quit smoking: Quit right away  Quit smoking totally, instead of slowly cutting back on how much you smoke over a period of time.  Go to counseling. You are more likely to quit if you go to counseling sessions regularly. Take medicine You may take medicines to help you quit. Some  medicines need a prescription, and some you can buy over-the-counter. Some medicines may contain a drug called nicotine to replace the nicotine in cigarettes. Medicines may:  Help you to stop having the desire to smoke (cravings).  Help to stop the problems that come when you stop smoking (withdrawal symptoms). Your doctor may ask you to use:  Nicotine patches, gum, or lozenges.  Nicotine inhalers or sprays.  Non-nicotine medicine that is taken by mouth. Find resources Find resources and other ways to help you quit smoking and remain smoke-free after you quit. These resources are most helpful when you use them often. They include:  Online chats with a counselor.  Phone quitlines.  Printed self-help materials.  Support groups or group counseling.  Text messaging programs.  Mobile phone apps. Use apps on your mobile phone or tablet that can help you stick to your quit plan. There are many free apps for mobile phones and tablets as well as websites. Examples include Quit Guide from the CDC and smokefree.gov  What things can I do to make it easier to quit?   Talk to your family and friends. Ask them to support and encourage you.  Call a phone quitline (1-800-QUIT-NOW), reach out to support groups, or work with a counselor.  Ask people who smoke to not smoke around you.  Avoid places that make you want to smoke,   such as: ? Bars. ? Parties. ? Smoke-break areas at work.  Spend time with people who do not smoke.  Lower the stress in your life. Stress can make you want to smoke. Try these things to help your stress: ? Getting regular exercise. ? Doing deep-breathing exercises. ? Doing yoga. ? Meditating. ? Doing a body scan. To do this, close your eyes, focus on one area of your body at a time from head to toe. Notice which parts of your body are tense. Try to relax the muscles in those areas. How will I feel when I quit smoking? Day 1 to 3 weeks Within the first 24 hours,  you may start to have some problems that come from quitting tobacco. These problems are very bad 2-3 days after you quit, but they do not often last for more than 2-3 weeks. You may get these symptoms:  Mood swings.  Feeling restless, nervous, angry, or annoyed.  Trouble concentrating.  Dizziness.  Strong desire for high-sugar foods and nicotine.  Weight gain.  Trouble pooping (constipation).  Feeling like you may vomit (nausea).  Coughing or a sore throat.  Changes in how the medicines that you take for other issues work in your body.  Depression.  Trouble sleeping (insomnia). Week 3 and afterward After the first 2-3 weeks of quitting, you may start to notice more positive results, such as:  Better sense of smell and taste.  Less coughing and sore throat.  Slower heart rate.  Lower blood pressure.  Clearer skin.  Better breathing.  Fewer sick days. Quitting smoking can be hard. Do not give up if you fail the first time. Some people need to try a few times before they succeed. Do your best to stick to your quit plan, and talk with your doctor if you have any questions or concerns. Summary  Smoking tobacco is the leading cause of preventable death. Quitting smoking can be hard, but it is one of the best things that you can do for your health.  When you decide to quit smoking, make a plan to help you succeed.  Quit smoking right away, not slowly over a period of time.  When you start quitting, seek help from your doctor, family, or friends. This information is not intended to replace advice given to you by your health care provider. Make sure you discuss any questions you have with your health care provider. Document Released: 01/21/2009 Document Revised: 06/14/2018 Document Reviewed: 06/15/2018 Elsevier Patient Education  2020 Elsevier Inc.  

## 2018-12-17 NOTE — Progress Notes (Signed)
Patient ID:Michael [name], male DOB:[birth year], 58 y.o. [id number]  Chief Complaint  Patient presents with  . Shoulder Pain    right     HPI  Michael [name] is a 58 y.o. male who continues to have chronic pain of the right shoulder.  He has had MRI which showed tendinitis no tear.  He has been to OT.  He cannot take NSAIDs secondary to sarcoidosis.  He has been doing his exercises at home.  He has pain at night and with overhead use of the shoulder.  He is tired of hurting.  I will have him see Dr. Aline Brochure and see if he is a candidate for arthroscopy of the shoulder.  Patient is agreeable.   Body mass index is 28.12 kg/m.  ROS  Review of Systems  Constitutional: Positive for activity change.  Musculoskeletal: Positive for arthralgias and back pain.  All other systems reviewed and are negative.   All other systems reviewed and are negative.  The following is a summary of the past history medically, past history surgically, known current medicines, social history and family history.  This information is gathered electronically by the computer from prior information and documentation.  I review this each visit and have found including this information at this point in the chart is beneficial and informative.    Past Medical History:  Diagnosis Date  . Allergic rhinitis   . Arthritis   . Chronic low back pain    follwed in Pain Clinic(Dr.Kirchmayer)  . Depression   . Depression with anxiety   . GERD (gastroesophageal reflux disease)   . History of kidney stones    passed  . Insomnia   . Migraines   . Sarcoidosis    Multisystem,pulmonary and hepatic (Dr.Rourk)Liver bx in 06 mildly postive AMA but on re-check normal    Past Surgical History:  Procedure Laterality Date  . BACK SURGERY  2012   2nd back surgery 3 monthes ago and in 2007  . COLONOSCOPY  11/05/2003   [id number] internal hemorrhoids, otherwise normal rectum and colon  . ESOPHAGOGASTRODUODENOSCOPY   03/22/2004   [id number] esophagogastroduodenoscopy. Small hiatal hernia otherwise normal stomach  . HARVEST BONE GRAFT Left    thigh  . HIP ARTHROPLASTY Bilateral    left 2017, right 2016  . HIP SURGERY Left 2017   bone graft  . LAMINECTOMY  2009   Balateral L4-L5  . TOOTH EXTRACTION Bilateral 12/28/2017   Procedure: DENTAL RESTORATION/EXTRACTIONS;  Surgeon: Diona Browner, DDS;  Location: [facility];  Service: Oral Surgery;  Laterality: Bilateral;    Family History  Problem Relation Age of Onset  . Colon cancer Brother        in his 53s    Social History Social History   Tobacco Use  . Smoking status: Current Every Day Smoker    Packs/day: 0.50    Years: 29.00    Pack years: 14.50    Types: Cigarettes  . Smokeless tobacco: Never Used  Substance Use Topics  . Alcohol use: Yes    Comment: ocassional  . Drug use: No    Allergies  Allergen Reactions  . Aspirin Other (See Comments)    REACTION: "Reacts with sarcoidosis"  . Methadone Other (See Comments)    REACTION: "Become mentally unstable"    Current Outpatient Medications  Medication Sig Dispense Refill  . albuterol (PROVENTIL) (2.5 MG/3ML) 0.083% nebulizer solution Take 2.5 mg by nebulization daily.     . Aspirin-Acetaminophen-Caffeine (GOODY HEADACHE PO) Take 1  packet by mouth daily as needed (for headache).     . Oxycodone HCl 10 MG TABS Take 10 mg by mouth 4 (four) times daily.     . pantoprazole (PROTONIX) 40 MG tablet Take 1 tablet (40 mg total) by mouth daily. Take 30 minutes before breakfast 90 tablet 3  . pregabalin (LYRICA) 150 MG capsule Take 150 mg by mouth 3 (three) times daily.     Marland Kitchen tiZANidine (ZANAFLEX) 4 MG tablet Take 4 mg by mouth 2 (two) times daily.   2  . traMADol (ULTRAM) 50 MG tablet Take 1 tablet (50 mg total) by mouth every 6 (six) hours as needed. 20 tablet 0  . XTAMPZA ER 9 MG C12A Take 9 mg by mouth 2 (two) times daily.   0   No current facility-administered medications for this visit.       Physical Exam  Blood pressure (!) 146/94, pulse 79, height 6\' 2"  (1.88 m), weight 219 lb (99.3 kg).  Constitutional: overall normal hygiene, normal nutrition, well developed, normal grooming, normal body habitus. Assistive device:none  Musculoskeletal: gait and station Limp none, muscle tone and strength are normal, no tremors or atrophy is present.  .  Neurological: coordination overall normal.  Deep tendon reflex/nerve stretch intact.  Sensation normal.  Cranial nerves II-XII intact.   Skin:   Normal overall no scars, lesions, ulcers or rashes. No psoriasis.  Psychiatric: Alert and oriented x 3.  Recent memory intact, remote memory unclear.  Normal mood and affect. Well groomed.  Good eye contact.  Cardiovascular: overall no swelling, no varicosities, no edema bilaterally, normal temperatures of the legs and arms, no clubbing, cyanosis and good capillary refill.  Lymphatic: palpation is normal.  He has near full ROM of the right shoulder with pain in the extremes.  He has no effusion.  NV intact.  Grips normal. All other systems reviewed and are negative   The patient has been educated about the nature of the problem(s) and counseled on treatment options.  The patient appeared to understand what I have discussed and is in agreement with it.  Encounter Diagnoses  Name Primary?  . Chronic right shoulder pain Yes  . Nicotine dependence, cigarettes, uncomplicated     PLAN Call if any problems.  Precautions discussed.  Continue current medications.   Return to clinic to see Dr. Aline Brochure   Electronically Signed Sanjuana Kava, MD 9/8/20209:23 AM

## 2018-12-19 ENCOUNTER — Ambulatory Visit (HOSPITAL_COMMUNITY): Payer: Medicare Other | Admitting: Occupational Therapy

## 2018-12-23 ENCOUNTER — Encounter (HOSPITAL_COMMUNITY): Payer: Medicare Other

## 2018-12-24 ENCOUNTER — Encounter (HOSPITAL_COMMUNITY): Payer: Medicare Other | Admitting: Occupational Therapy

## 2018-12-25 ENCOUNTER — Encounter (HOSPITAL_COMMUNITY): Payer: Medicare Other | Admitting: Occupational Therapy

## 2018-12-26 ENCOUNTER — Encounter (HOSPITAL_COMMUNITY): Payer: Medicare Other | Admitting: Occupational Therapy

## 2018-12-31 ENCOUNTER — Encounter (HOSPITAL_COMMUNITY): Payer: Medicare Other

## 2019-01-02 ENCOUNTER — Encounter (HOSPITAL_COMMUNITY): Payer: Medicare Other | Admitting: Occupational Therapy

## 2019-01-03 ENCOUNTER — Ambulatory Visit: Payer: Medicare Other | Admitting: Orthopedic Surgery

## 2019-01-07 ENCOUNTER — Ambulatory Visit: Payer: Medicare Other | Admitting: Orthopedic Surgery

## 2019-01-14 ENCOUNTER — Ambulatory Visit (INDEPENDENT_AMBULATORY_CARE_PROVIDER_SITE_OTHER): Payer: Medicare Other | Admitting: Orthopedic Surgery

## 2019-01-14 ENCOUNTER — Other Ambulatory Visit: Payer: Self-pay

## 2019-01-14 ENCOUNTER — Encounter: Payer: Self-pay | Admitting: Orthopedic Surgery

## 2019-01-14 VITALS — BP 144/124 | HR 72 | Ht 74.0 in | Wt 212.0 lb

## 2019-01-14 DIAGNOSIS — M19011 Primary osteoarthritis, right shoulder: Secondary | ICD-10-CM

## 2019-01-14 NOTE — Patient Instructions (Signed)
You will have surgery on the right surgery removal of distal clavicle   You agree to Dr Aline Brochure providing pain medication for 2 weeks after surgery

## 2019-01-14 NOTE — Addendum Note (Signed)
Addended byCandice Camp on: 01/14/2019 03:31 PM   Modules accepted: Orders, SmartSet

## 2019-01-14 NOTE — Progress Notes (Signed)
Michael [name]  01/14/2019  HISTORY SECTION :  Chief Complaint  Patient presents with  . Shoulder Pain    right / surgical consult    Michael [name] is 58 years old he has had bilateral hip replacements he has had several lumbar spine operations.  Unfortunately he has developed chronic pain syndrome and is no longer being seen at the pain management center, seeking new arrangements for chronic pain management but presents to [place] at the request of Dr. Luna Glasgow for possible surgery on his right shoulder.  Complains of a one-year history of severe right shoulder pain associated with forward elevation unrelieved by physical therapy cortisone injections.  He is not NSAID tolerant secondary to sarcoidosis  He does not report weakness per se in the shoulder but when he reaches out across his body or in front of him he has pain in the deltoid area and distal clavicle  Review of Systems  Constitutional: Negative for chills, fever and weight loss.  Respiratory: Positive for shortness of breath.   Cardiovascular: Negative for chest pain.  Neurological: Negative for tingling, sensory change, speech change and focal weakness.  All other systems reviewed and are negative.    has a past medical history of Allergic rhinitis, Arthritis, Chronic low back pain, Depression, Depression with anxiety, GERD (gastroesophageal reflux disease), History of kidney stones, Insomnia, Migraines, and Sarcoidosis.   Past Surgical History:  Procedure Laterality Date  . BACK SURGERY  2012   2nd back surgery 3 monthes ago and in 2007  . COLONOSCOPY  11/05/2003   [id number] internal hemorrhoids, otherwise normal rectum and colon  . ESOPHAGOGASTRODUODENOSCOPY  03/22/2004   [id number] esophagogastroduodenoscopy. Small hiatal hernia otherwise normal stomach  . HARVEST BONE GRAFT Left    thigh  . HIP ARTHROPLASTY Bilateral    left 2017, right 2016  . HIP SURGERY Left 2017   bone graft  . LAMINECTOMY  2009   Balateral L4-L5   . TOOTH EXTRACTION Bilateral 12/28/2017   Procedure: DENTAL RESTORATION/EXTRACTIONS;  Surgeon: Diona Browner, DDS;  Location: [facility];  Service: Oral Surgery;  Laterality: Bilateral;    Body mass index is 27.22 kg/m.   Allergies  Allergen Reactions  . Aspirin Other (See Comments)    REACTION: "Reacts with sarcoidosis"  . Methadone Other (See Comments)    REACTION: "Become mentally unstable"     Current Outpatient Medications:  .  albuterol (PROVENTIL) (2.5 MG/3ML) 0.083% nebulizer solution, Take 2.5 mg by nebulization daily. , Disp: , Rfl:  .  Aspirin-Acetaminophen-Caffeine (GOODY HEADACHE PO), Take 1 packet by mouth daily as needed (for headache). , Disp: , Rfl:  .  pantoprazole (PROTONIX) 40 MG tablet, Take 1 tablet (40 mg total) by mouth daily. Take 30 minutes before breakfast, Disp: 90 tablet, Rfl: 3 .  tiZANidine (ZANAFLEX) 4 MG tablet, Take 4 mg by mouth 2 (two) times daily. , Disp: , Rfl: 2 .  Oxycodone HCl 10 MG TABS, Take 10 mg by mouth 4 (four) times daily. , Disp: , Rfl:  .  pregabalin (LYRICA) 150 MG capsule, Take 150 mg by mouth 3 (three) times daily. , Disp: , Rfl:  .  XTAMPZA ER 9 MG C12A, Take 9 mg by mouth 2 (two) times daily. , Disp: , Rfl: 0   PHYSICAL EXAM SECTION: 1) BP (!) 144/124   Pulse 72   Ht 6\' 2"  (1.88 m)   Wt 212 lb (96.2 kg)   BMI 27.22 kg/m   Body mass index is 27.22 kg/m.  General appearance: Well-developed well-nourished no gross deformities  2) Cardiovascular normal pulse and perfusion in the upper extremities normal color without edema  3) Neurologically deep tendon reflexes are equal and normal, no sensation loss or deficits no pathologic reflexes  4) Psychological: Awake alert and oriented x3 mood and affect normal  5) Skin no lacerations or ulcerations no nodularity no palpable masses, no erythema or nodularity  6) Musculoskeletal:   Left shoulder normal range of motion in the left shoulder with no instability strength seems normal  nontender  Right shoulder tenderness over the distal clavicle lateral deltoid Shoulder stable in all directions Strength and muscle tone normal Painful range of motion with abduction and flexion and reaching across his chest   MEDICAL DECISION SECTION:  No diagnosis found.  Imaging Image #1 was on a disc 3 views of the right shoulder.  I do not see fracture dislocation or subluxation no arthritis  Image #2 MRI shows impinging osteophytes distal clavicle and medial acromion with rotator cuff tendinitis no tear  Plan:  (Rx., Inj., surg., Frx, MRI/CT, XR:2)  The procedure has been fully reviewed with the patient; The risks and benefits of surgery have been discussed and explained and understood. Alternative treatment has also been reviewed, questions were encouraged and answered. The postoperative plan is also been reviewed.  Michael [name] understands that he may or may not get complete relief of his shoulder pain and that surgery cannot fix tendinitis.  He is agreeable to surgery to remove the distal clavicle even though he may not get full pain relief  He agrees to our office providing his oral pain medications for 2 weeks after surgery    2:39 PM Arther Abbott, MD  01/14/2019

## 2019-01-16 DIAGNOSIS — G8929 Other chronic pain: Secondary | ICD-10-CM | POA: Diagnosis not present

## 2019-01-16 DIAGNOSIS — G5793 Unspecified mononeuropathy of bilateral lower limbs: Secondary | ICD-10-CM | POA: Diagnosis not present

## 2019-01-17 NOTE — Patient Instructions (Signed)
Michael [name]  01/17/2019     @PREFPERIOPPHARMACY @   Your procedure is scheduled on  01/23/2019  Report to [facility] at  [place].  Call this number if you have problems the morning of surgery:  [phone]   Remember:  Do not eat or drink after midnight.                      Take these medicines the morning of surgery with A SIP OF WATER  Hydroxyzine, lyrica, effexor. Use your nebulizer before you come.    Do not wear jewelry, make-up or nail polish.  Do not wear lotions, powders, or perfumes, or deodorant. Please brush your teeth.  Do not shave 48 hours prior to surgery.  Men may shave face and neck.  Do not bring valuables to the hospital.  [facility] is not responsible for any belongings or valuables.  Contacts, dentures or bridgework may not be worn into surgery.  Leave your suitcase in the car.  After surgery it may be brought to your room.  For patients admitted to the hospital, discharge time will be determined by your treatment team.  Patients discharged the day of surgery will not be allowed to drive home.   Name and phone number of your driver:   family Special instructions:  None  Please read over the following fact sheets that you were given. Anesthesia Post-op Instructions and Care and Recovery After Surgery       Surgery for Acromioclavicular Separation, Care After This sheet gives you information about how to care for yourself after your procedure. Your health care provider may also give you more specific instructions. If you have problems or questions, contact your health care provider. What can I expect after the procedure? After the procedure, it is common to have:  Pain.  Soreness.  Stiffness.  Tenderness. Follow these instructions at home: If you have a sling:  Wear the sling as told by your health care provider. Remove it only as told by your health care provider.  Keep the sling clean.  Do not let your sling get wet.  Bathing  Do not take baths, swim, or use a hot tub until your health care provider approves. Ask your health care provider if you may take showers. You may only be allowed to take sponge baths.  Keep your bandage (dressing) dry until your health care provider says it can be removed. Incision care   Follow instructions from your health care provider about how to take care of your incision or incisions. Make sure you: ? Wash your hands with soap and water before and after you change your bandage (dressing). If soap and water are not available, use hand sanitizer. ? Change your dressing as told by your health care provider. ? Leave stitches (sutures), skin glue, or adhesive strips in place. These skin closures may need to stay in place for 2 weeks or longer. If adhesive strip edges start to loosen and curl up, you may trim the loose edges. Do not remove adhesive strips completely unless your health care provider tells you to do that.  Check your incision areas every day for signs of infection. Check for: ? More redness, swelling, or pain. ? Fluid or blood. ? Warmth. ? Pus or a bad smell. Managing pain, stiffness, and swelling   If directed, put ice on your shoulder area. ? Put ice in a plastic bag. ?  Place a towel between your skin and the bag. ? Leave the ice on for 20 minutes, 2-3 times a day.  Move your fingers often to reduce stiffness and swelling.  Raise (elevate) the injured area above the level of your heart while you are sitting or lying down. Driving  Do not drive for 24 hours if you were given a sedative during your procedure.  Ask your health care provider when it is safe for you to drive. Activity  Do not use your arm to support your body weight until your health care provider approves.  Do not lift your arm at the shoulder with your muscles until told by your health care provider.  Do not lift anything that is heavier than 10 lb (4.5 kg), or the limit that you are  told, until your health care provider says that it is safe.  Return to your normal activities as told by your health care provider. Ask your health care provider what activities are safe for you.  Do exercises only as told by your health care provider.  Move your fingers, hands, and elbow to increase blood flow as told by your health care provider. Medicines  Take over-the-counter and prescription medicines only as told by your health care provider.  Ask your health care provider if the medicine prescribed to you: ? Requires you to avoid driving or using heavy machinery. ? Can cause constipation. You may need to take steps to prevent or treat constipation, such as:  Drink enough fluid to keep your urine pale yellow.  Take over-the-counter or prescription medicines.  Eat foods that are high in fiber, such as beans, whole grains, and fresh fruits and vegetables.  Limit foods that are high in fat and processed sugars, such as fried or sweet foods. General instructions   Do not use any products that contain nicotine or tobacco, such as cigarettes, e-cigarettes, and chewing tobacco. These can delay healing after surgery. If you need help quitting, ask your health care provider.  Keep all follow-up visits as told by your health care provider. This is important. Contact a health care provider if:  You continue to have pain and stiffness after 6 weeks.  You cannot do your physical therapy exercises.  You have any of these problems: ? A fever. ? More redness, swelling, or pain around your incision. ? Fluid or blood coming from your incision. ? Your incision feeling warm to the touch. ? Pus or a bad smell coming from your incision. ? Pain that gets worse or does not get better with medicine. Get help right away if:  Your arm becomes numb.  Your arm turns cold and blue. Summary  If you have a sling, wear it as told by your health care provider. Remove it only as told by your  health care provider.  Change your bandage (dressing) as told by your health care provider. Check your incision areas every day for signs of infection.  Return to your normal activities as told by your health care provider. Ask your health care provider what activities are safe for you.  Keep all follow-up visits as told by your health care provider. This is important. This information is not intended to replace advice given to you by your health care provider. Make sure you discuss any questions you have with your health care provider. Document Released: 10/26/2004 Document Revised: 03/11/2018 Document Reviewed: 03/11/2018 Elsevier Patient Education  2020 [employer] Anesthesia, Adult, Care After This sheet gives  you information about how to care for yourself after your procedure. Your health care provider may also give you more specific instructions. If you have problems or questions, contact your health care provider. What can I expect after the procedure? After the procedure, the following side effects are common:  Pain or discomfort at the IV site.  Nausea.  Vomiting.  Sore throat.  Trouble concentrating.  Feeling cold or chills.  Weak or tired.  Sleepiness and fatigue.  Soreness and body aches. These side effects can affect parts of the body that were not involved in surgery. Follow these instructions at home:  For at least 24 hours after the procedure:  Have a responsible adult stay with you. It is important to have someone help care for you until you are awake and alert.  Rest as needed.  Do not: ? Participate in activities in which you could fall or become injured. ? Drive. ? Use heavy machinery. ? Drink alcohol. ? Take sleeping pills or medicines that cause drowsiness. ? Make important decisions or sign legal documents. ? Take care of children on your own. Eating and drinking  Follow any instructions from your health care provider about eating  or drinking restrictions.  When you feel hungry, start by eating small amounts of foods that are soft and easy to digest (bland), such as toast. Gradually return to your regular diet.  Drink enough fluid to keep your urine pale yellow.  If you vomit, rehydrate by drinking water, juice, or clear broth. General instructions  If you have sleep apnea, surgery and certain medicines can increase your risk for breathing problems. Follow instructions from your health care provider about wearing your sleep device: ? Anytime you are sleeping, including during daytime naps. ? While taking prescription pain medicines, sleeping medicines, or medicines that make you drowsy.  Return to your normal activities as told by your health care provider. Ask your health care provider what activities are safe for you.  Take over-the-counter and prescription medicines only as told by your health care provider.  If you smoke, do not smoke without supervision.  Keep all follow-up visits as told by your health care provider. This is important. Contact a health care provider if:  You have nausea or vomiting that does not get better with medicine.  You cannot eat or drink without vomiting.  You have pain that does not get better with medicine.  You are unable to pass urine.  You develop a skin rash.  You have a fever.  You have redness around your IV site that gets worse. Get help right away if:  You have difficulty breathing.  You have chest pain.  You have blood in your urine or stool, or you vomit blood. Summary  After the procedure, it is common to have a sore throat or nausea. It is also common to feel tired.  Have a responsible adult stay with you for the first 24 hours after general anesthesia. It is important to have someone help care for you until you are awake and alert.  When you feel hungry, start by eating small amounts of foods that are soft and easy to digest (bland), such as toast.  Gradually return to your regular diet.  Drink enough fluid to keep your urine pale yellow.  Return to your normal activities as told by your health care provider. Ask your health care provider what activities are safe for you. This information is not intended to replace advice given to you  by your health care provider. Make sure you discuss any questions you have with your health care provider. Document Released: 07/03/2000 Document Revised: 03/30/2017 Document Reviewed: 11/10/2016 Elsevier Patient Education  2020 [employer]. How to Use Chlorhexidine for Bathing Chlorhexidine gluconate (CHG) is a germ-killing (antiseptic) solution that is used to clean the skin. It can get rid of the bacteria that normally live on the skin and can keep them away for about 24 hours. To clean your skin with CHG, you may be given:  A CHG solution to use in the shower or as part of a sponge bath.  A prepackaged cloth that contains CHG. Cleaning your skin with CHG may help lower the risk for infection:  While you are staying in the intensive care unit of the hospital.  If you have a vascular access, such as a central line, to provide short-term or long-term access to your veins.  If you have a catheter to drain urine from your bladder.  If you are on a ventilator. A ventilator is a machine that helps you breathe by moving air in and out of your lungs.  After surgery. What are the risks? Risks of using CHG include:  A skin reaction.  Hearing loss, if CHG gets in your ears.  Eye injury, if CHG gets in your eyes and is not rinsed out.  The CHG product catching fire. Make sure that you avoid smoking and flames after applying CHG to your skin. Do not use CHG:  If you have a chlorhexidine allergy or have previously reacted to chlorhexidine.  On babies younger than 3 months of age. How to use CHG solution  Use CHG only as told by your health care provider, and follow the instructions on the label.   Use the full amount of CHG as directed. Usually, this is one bottle. During a shower Follow these steps when using CHG solution during a shower (unless your health care provider gives you different instructions): 1. Start the shower. 2. Use your normal soap and shampoo to wash your face and hair. 3. Turn off the shower or move out of the shower stream. 4. Pour the CHG onto a clean washcloth. Do not use any type of brush or rough-edged sponge. 5. Starting at your neck, lather your body down to your toes. Make sure you follow these instructions: ? If you will be having surgery, pay special attention to the part of your body where you will be having surgery. Scrub this area for at least 1 minute. ? Do not use CHG on your head or face. If the solution gets into your ears or eyes, rinse them well with water. ? Avoid your genital area. ? Avoid any areas of skin that have broken skin, cuts, or scrapes. ? Scrub your back and under your arms. Make sure to wash skin folds. 6. Let the lather sit on your skin for 1-2 minutes or as long as told by your health care provider. 7. Thoroughly rinse your entire body in the shower. Make sure that all body creases and crevices are rinsed well. 8. Dry off with a clean towel. Do not put any substances on your body afterward-such as powder, lotion, or perfume-unless you are told to do so by your health care provider. Only use lotions that are recommended by the manufacturer. 9. Put on clean clothes or pajamas. 10. If it is the night before your surgery, sleep in clean sheets.  During a sponge bath Follow these steps when  using CHG solution during a sponge bath (unless your health care provider gives you different instructions): 1. Use your normal soap and shampoo to wash your face and hair. 2. Pour the CHG onto a clean washcloth. 3. Starting at your neck, lather your body down to your toes. Make sure you follow these instructions: ? If you will be having surgery,  pay special attention to the part of your body where you will be having surgery. Scrub this area for at least 1 minute. ? Do not use CHG on your head or face. If the solution gets into your ears or eyes, rinse them well with water. ? Avoid your genital area. ? Avoid any areas of skin that have broken skin, cuts, or scrapes. ? Scrub your back and under your arms. Make sure to wash skin folds. 4. Let the lather sit on your skin for 1-2 minutes or as long as told by your health care provider. 5. Using a different clean, wet washcloth, thoroughly rinse your entire body. Make sure that all body creases and crevices are rinsed well. 6. Dry off with a clean towel. Do not put any substances on your body afterward-such as powder, lotion, or perfume-unless you are told to do so by your health care provider. Only use lotions that are recommended by the manufacturer. 7. Put on clean clothes or pajamas. 8. If it is the night before your surgery, sleep in clean sheets. How to use CHG prepackaged cloths  Only use CHG cloths as told by your health care provider, and follow the instructions on the label.  Use the CHG cloth on clean, dry skin.  Do not use the CHG cloth on your head or face unless your health care provider tells you to.  When washing with the CHG cloth: ? Avoid your genital area. ? Avoid any areas of skin that have broken skin, cuts, or scrapes. Before surgery Follow these steps when using a CHG cloth to clean before surgery (unless your health care provider gives you different instructions): 1. Using the CHG cloth, vigorously scrub the part of your body where you will be having surgery. Scrub using a back-and-forth motion for 3 minutes. The area on your body should be completely wet with CHG when you are done scrubbing. 2. Do not rinse. Discard the cloth and let the area air-dry. Do not put any substances on the area afterward, such as powder, lotion, or perfume. 3. Put on clean clothes or  pajamas. 4. If it is the night before your surgery, sleep in clean sheets.  For general bathing Follow these steps when using CHG cloths for general bathing (unless your health care provider gives you different instructions). 1. Use a separate CHG cloth for each area of your body. Make sure you wash between any folds of skin and between your fingers and toes. Wash your body in the following order, switching to a new cloth after each step: ? The front of your neck, shoulders, and chest. ? Both of your arms, under your arms, and your hands. ? Your stomach and groin area, avoiding the genitals. ? Your right leg and foot. ? Your left leg and foot. ? The back of your neck, your back, and your buttocks. 2. Do not rinse. Discard the cloth and let the area air-dry. Do not put any substances on your body afterward-such as powder, lotion, or perfume-unless you are told to do so by your health care provider. Only use lotions that are recommended by  the manufacturer. 3. Put on clean clothes or pajamas. Contact a health care provider if:  Your skin gets irritated after scrubbing.  You have questions about using your solution or cloth. Get help right away if:  Your eyes become very red or swollen.  Your eyes itch badly.  Your skin itches badly and is red or swollen.  Your hearing changes.  You have trouble seeing.  You have swelling or tingling in your mouth or throat.  You have trouble breathing.  You swallow any chlorhexidine. Summary  Chlorhexidine gluconate (CHG) is a germ-killing (antiseptic) solution that is used to clean the skin. Cleaning your skin with CHG may help to lower your risk for infection.  You may be given CHG to use for bathing. It may be in a bottle or in a prepackaged cloth to use on your skin. Carefully follow your health care provider's instructions and the instructions on the product label.  Do not use CHG if you have a chlorhexidine allergy.  Contact your  health care provider if your skin gets irritated after scrubbing. This information is not intended to replace advice given to you by your health care provider. Make sure you discuss any questions you have with your health care provider. Document Released: 12/20/2011 Document Revised: 06/13/2018 Document Reviewed: 02/22/2017 Elsevier Patient Education  2020 [employer].

## 2019-01-21 ENCOUNTER — Other Ambulatory Visit (HOSPITAL_COMMUNITY)
Admission: RE | Admit: 2019-01-21 | Discharge: 2019-01-21 | Disposition: A | Discharge status: Home / Self Care | Payer: Medicare Other | Source: Ambulatory Visit | Attending: Orthopedic Surgery | Admitting: Orthopedic Surgery

## 2019-01-21 ENCOUNTER — Other Ambulatory Visit: Payer: Self-pay

## 2019-01-21 ENCOUNTER — Encounter (HOSPITAL_COMMUNITY)
Admission: RE | Admit: 2019-01-21 | Discharge: 2019-01-21 | Disposition: A | Discharge status: Home / Self Care | Payer: Medicare Other | Source: Ambulatory Visit | Attending: Orthopedic Surgery | Admitting: Orthopedic Surgery

## 2019-01-21 ENCOUNTER — Encounter (HOSPITAL_COMMUNITY): Payer: Self-pay

## 2019-01-21 DIAGNOSIS — Z01812 Encounter for preprocedural laboratory examination: Secondary | ICD-10-CM | POA: Diagnosis not present

## 2019-01-21 DIAGNOSIS — H538 Other visual disturbances: Secondary | ICD-10-CM | POA: Diagnosis not present

## 2019-01-21 DIAGNOSIS — R519 Headache, unspecified: Secondary | ICD-10-CM | POA: Diagnosis not present

## 2019-01-21 DIAGNOSIS — Z20828 Contact with and (suspected) exposure to other viral communicable diseases: Secondary | ICD-10-CM | POA: Diagnosis not present

## 2019-01-21 HISTORY — DX: Sleep apnea, unspecified: G47.30

## 2019-01-21 LAB — CBC WITH DIFFERENTIAL/PLATELET
Abs Immature Granulocytes: 0.05 10*3/uL (ref 0.00–0.07)
Basophils Absolute: 0.1 10*3/uL (ref 0.0–0.1)
Basophils Relative: 1 %
Eosinophils Absolute: 0.6 10*3/uL — ABNORMAL HIGH (ref 0.0–0.5)
Eosinophils Relative: 6 %
HCT: 39.9 % (ref 39.0–52.0)
Hemoglobin: 12.7 g/dL — ABNORMAL LOW (ref 13.0–17.0)
Immature Granulocytes: 1 %
Lymphocytes Relative: 39 %
Lymphs Abs: 3.7 10*3/uL (ref 0.7–4.0)
MCH: 30.4 pg (ref 26.0–34.0)
MCHC: 31.8 g/dL (ref 30.0–36.0)
MCV: 95.5 fL (ref 80.0–100.0)
Monocytes Absolute: 0.9 10*3/uL (ref 0.1–1.0)
Monocytes Relative: 9 %
Neutro Abs: 4.3 10*3/uL (ref 1.7–7.7)
Neutrophils Relative %: 44 %
Platelets: 286 10*3/uL (ref 150–400)
RBC: 4.18 MIL/uL — ABNORMAL LOW (ref 4.22–5.81)
RDW: 15 % (ref 11.5–15.5)
WBC: 9.6 10*3/uL (ref 4.0–10.5)
nRBC: 0 % (ref 0.0–0.2)

## 2019-01-21 LAB — BASIC METABOLIC PANEL
Anion gap: 9 (ref 5–15)
BUN: 14 mg/dL (ref 6–20)
CO2: 24 mmol/L (ref 22–32)
Calcium: 9.2 mg/dL (ref 8.9–10.3)
Chloride: 108 mmol/L (ref 98–111)
Creatinine, Ser: 1.44 mg/dL — ABNORMAL HIGH (ref 0.61–1.24)
GFR calc Af Amer: >60 mL/min (ref >60)
GFR calc non Af Amer: 54 mL/min — ABNORMAL LOW (ref >60)
Glucose, Bld: 106 mg/dL — ABNORMAL HIGH (ref 70–99)
Potassium: 4.5 mmol/L (ref 3.5–5.1)
Sodium: 141 mmol/L (ref 135–145)

## 2019-01-21 LAB — SARS CORONAVIRUS 2 (TAT 6-24 HRS): SARS Coronavirus 2: NEGATIVE

## 2019-01-21 NOTE — Progress Notes (Signed)
Dr Flossie Buffy aware of BP 143/99 RA 140/103 LA  And that patient has never been on any BP meds.  Instructed patient to see Rosealee Albee NP in [place].  No other orders given.

## 2019-01-22 NOTE — H&P (Signed)
Michael [name]   01/14/2019   HISTORY SECTION :       Chief Complaint  Patient presents with  . Shoulder Pain      right / surgical consult     Michael [name] is 58 years old he has had bilateral hip replacements he has had several lumbar spine operations.  Unfortunately he has developed chronic pain syndrome and is no longer being seen at the pain management center, seeking new arrangements for chronic pain management but presents to [place] at the request of Dr. Luna Glasgow for possible surgery on his right shoulder.  Complains of a one-year history of severe right shoulder pain associated with forward elevation unrelieved by physical therapy cortisone injections.  He is not NSAID tolerant secondary to sarcoidosis   He does not report weakness per se in the shoulder but when he reaches out across his body or in front of him he has pain in the deltoid area and distal clavicle   Review of Systems  Constitutional: Negative for chills, fever and weight loss.  Respiratory: Positive for shortness of breath.   Cardiovascular: Negative for chest pain.  Neurological: Negative for tingling, sensory change, speech change and focal weakness.  All other systems reviewed and are negative.      has a past medical history of Allergic rhinitis, Arthritis, Chronic low back pain, Depression, Depression with anxiety, GERD (gastroesophageal reflux disease), History of kidney stones, Insomnia, Migraines, and Sarcoidosis.    Past Surgical History:  Procedure Laterality Date  . BACK SURGERY   2012    2nd back surgery 3 monthes ago and in 2007  . COLONOSCOPY   11/05/2003    [id number] internal hemorrhoids, otherwise normal rectum and colon  . ESOPHAGOGASTRODUODENOSCOPY   03/22/2004    [id number] esophagogastroduodenoscopy. Small hiatal hernia otherwise normal stomach  . HARVEST BONE GRAFT Left      thigh  . HIP ARTHROPLASTY Bilateral      left 2017, right 2016  . HIP SURGERY Left 2017    bone graft  .  LAMINECTOMY   2009    Balateral L4-L5  . TOOTH EXTRACTION Bilateral 12/28/2017    Procedure: DENTAL RESTORATION/EXTRACTIONS;  Surgeon: Diona Browner, DDS;  Location: [facility];  Service: Oral Surgery;  Laterality: Bilateral;      Body mass index is 27.22 kg/m.     Allergies  Allergen Reactions  . Aspirin Other (See Comments)      REACTION: "Reacts with sarcoidosis"  . Methadone Other (See Comments)      REACTION: "Become mentally unstable"        Current Outpatient Medications:  .  albuterol (PROVENTIL) (2.5 MG/3ML) 0.083% nebulizer solution, Take 2.5 mg by nebulization daily. , Disp: , Rfl:  .  Aspirin-Acetaminophen-Caffeine (GOODY HEADACHE PO), Take 1 packet by mouth daily as needed (for headache). , Disp: , Rfl:  .  pantoprazole (PROTONIX) 40 MG tablet, Take 1 tablet (40 mg total) by mouth daily. Take 30 minutes before breakfast, Disp: 90 tablet, Rfl: 3 .  tiZANidine (ZANAFLEX) 4 MG tablet, Take 4 mg by mouth 2 (two) times daily. , Disp: , Rfl: 2 .  Oxycodone HCl 10 MG TABS, Take 10 mg by mouth 4 (four) times daily. , Disp: , Rfl:  .  pregabalin (LYRICA) 150 MG capsule, Take 150 mg by mouth 3 (three) times daily. , Disp: , Rfl:  .  XTAMPZA ER 9 MG C12A, Take 9 mg by mouth 2 (two) times daily. , Disp: , Rfl: 0  PHYSICAL EXAM SECTION: 1) BP (!) 144/124   Pulse 72   Ht 6\' 2"  (1.88 m)   Wt 212 lb (96.2 kg)   BMI 27.22 kg/m   Body mass index is 27.22 kg/m. General appearance: Well-developed well-nourished no gross deformities  2) Cardiovascular normal pulse and perfusion in the upper extremities normal color without edema  3) Neurologically deep tendon reflexes are equal and normal, no sensation loss or deficits no pathologic reflexes   4) Psychological: Awake alert and oriented x3 mood and affect normal   5) Skin no lacerations or ulcerations no nodularity no palpable masses, no erythema or nodularity   6) Musculoskeletal:    Left shoulder normal range of motion in the  left shoulder with no instability strength seems normal nontender   Right shoulder tenderness over the distal clavicle lateral deltoid Shoulder stable in all directions Strength and muscle tone normal Painful range of motion with abduction and flexion and reaching across his chest     MEDICAL DECISION SECTION:  No diagnosis found.   Imaging Image #1 was on a disc 3 views of the right shoulder.  I do not see fracture dislocation or subluxation no arthritis   Image #2 MRI shows impinging osteophytes distal clavicle and medial acromion with rotator cuff tendinitis no tear   Plan:  (Rx., Inj., surg., Frx, MRI/CT, XR:2)   SURGERY PLANNED OPEN DISTAL CLAVICLE EXCISION   The procedure has been fully reviewed with the patient; The risks and benefits of surgery have been discussed and explained and understood. Alternative treatment has also been reviewed, questions were encouraged and answered. The postoperative plan is also been reviewed.   Michael [name] understands that he may or may not get complete relief of his shoulder pain and that surgery cannot fix tendinitis.  He is agreeable to surgery to remove the distal clavicle even though he may not get full pain relief   He agrees to our office providing his oral pain medications for 2 weeks after surgery

## 2019-01-23 ENCOUNTER — Encounter (HOSPITAL_COMMUNITY): Payer: Self-pay | Admitting: *Deleted

## 2019-01-23 ENCOUNTER — Encounter (HOSPITAL_COMMUNITY)
Admission: RE | Disposition: A | Discharge status: Home / Self Care | Payer: Self-pay | Source: Home / Self Care | Attending: Orthopedic Surgery

## 2019-01-23 ENCOUNTER — Ambulatory Visit (HOSPITAL_COMMUNITY): Payer: Medicare Other | Admitting: Anesthesiology

## 2019-01-23 ENCOUNTER — Ambulatory Visit (HOSPITAL_COMMUNITY)
Admission: RE | Admit: 2019-01-23 | Discharge: 2019-01-23 | Disposition: A | Discharge status: Home / Self Care | Payer: Medicare Other | Attending: Orthopedic Surgery | Admitting: Orthopedic Surgery

## 2019-01-23 DIAGNOSIS — F17210 Nicotine dependence, cigarettes, uncomplicated: Secondary | ICD-10-CM | POA: Insufficient documentation

## 2019-01-23 DIAGNOSIS — G894 Chronic pain syndrome: Secondary | ICD-10-CM | POA: Diagnosis not present

## 2019-01-23 DIAGNOSIS — Z79899 Other long term (current) drug therapy: Secondary | ICD-10-CM | POA: Insufficient documentation

## 2019-01-23 DIAGNOSIS — M25711 Osteophyte, right shoulder: Secondary | ICD-10-CM | POA: Diagnosis not present

## 2019-01-23 DIAGNOSIS — I10 Essential (primary) hypertension: Secondary | ICD-10-CM | POA: Insufficient documentation

## 2019-01-23 DIAGNOSIS — M19011 Primary osteoarthritis, right shoulder: Secondary | ICD-10-CM

## 2019-01-23 DIAGNOSIS — K219 Gastro-esophageal reflux disease without esophagitis: Secondary | ICD-10-CM | POA: Diagnosis not present

## 2019-01-23 DIAGNOSIS — G43909 Migraine, unspecified, not intractable, without status migrainosus: Secondary | ICD-10-CM | POA: Insufficient documentation

## 2019-01-23 DIAGNOSIS — D869 Sarcoidosis, unspecified: Secondary | ICD-10-CM | POA: Diagnosis not present

## 2019-01-23 DIAGNOSIS — M19019 Primary osteoarthritis, unspecified shoulder: Secondary | ICD-10-CM

## 2019-01-23 DIAGNOSIS — F329 Major depressive disorder, single episode, unspecified: Secondary | ICD-10-CM | POA: Insufficient documentation

## 2019-01-23 DIAGNOSIS — G4700 Insomnia, unspecified: Secondary | ICD-10-CM | POA: Diagnosis not present

## 2019-01-23 DIAGNOSIS — Z96643 Presence of artificial hip joint, bilateral: Secondary | ICD-10-CM | POA: Diagnosis not present

## 2019-01-23 HISTORY — PX: RESECTION DISTAL CLAVICAL: SHX5053

## 2019-01-23 SURGERY — EXCISION, CLAVICLE, DISTAL, OPEN
Anesthesia: General | Site: Shoulder | Laterality: Right

## 2019-01-23 MED ORDER — PROMETHAZINE HCL 25 MG/ML IJ SOLN: 6.2500 mg | INTRAMUSCULAR | Status: DC | PRN

## 2019-01-23 MED ORDER — FENTANYL CITRATE (PF) 250 MCG/5ML IJ SOLN
INTRAMUSCULAR | Status: AC
  Filled 2019-01-23: qty 5

## 2019-01-23 MED ORDER — CEFAZOLIN SODIUM-DEXTROSE 2-4 GM/100ML-% IV SOLN
INTRAVENOUS | Status: AC
  Filled 2019-01-23: qty 100

## 2019-01-23 MED ORDER — HYDROCODONE-ACETAMINOPHEN 7.5-325 MG PO TABS: 1.0000 | ORAL_TABLET | Freq: Once | ORAL | Status: DC | PRN

## 2019-01-23 MED ORDER — FENTANYL CITRATE (PF) 100 MCG/2ML IJ SOLN
INTRAMUSCULAR | Status: DC | PRN
  Administered 2019-01-23: 50 ug via INTRAVENOUS
  Administered 2019-01-23: 150 ug via INTRAVENOUS
  Administered 2019-01-23: 50 ug via INTRAVENOUS

## 2019-01-23 MED ORDER — SUGAMMADEX SODIUM 500 MG/5ML IV SOLN
INTRAVENOUS | Status: DC | PRN
  Administered 2019-01-23: 200 mg via INTRAVENOUS

## 2019-01-23 MED ORDER — PHENYLEPHRINE 40 MCG/ML (10ML) SYRINGE FOR IV PUSH (FOR BLOOD PRESSURE SUPPORT)
PREFILLED_SYRINGE | INTRAVENOUS | Status: AC
  Filled 2019-01-23: qty 10

## 2019-01-23 MED ORDER — BUPIVACAINE-EPINEPHRINE (PF) 0.5% -1:200000 IJ SOLN
INTRAMUSCULAR | Status: AC
  Filled 2019-01-23: qty 60

## 2019-01-23 MED ORDER — SUCCINYLCHOLINE CHLORIDE 200 MG/10ML IV SOSY
PREFILLED_SYRINGE | INTRAVENOUS | Status: AC
  Filled 2019-01-23: qty 10

## 2019-01-23 MED ORDER — ONDANSETRON HCL 4 MG/2ML IJ SOLN
INTRAMUSCULAR | Status: DC | PRN
  Administered 2019-01-23: 4 mg via INTRAVENOUS

## 2019-01-23 MED ORDER — PROPOFOL 10 MG/ML IV BOLUS
INTRAVENOUS | Status: DC | PRN
  Administered 2019-01-23: 200 mg via INTRAVENOUS

## 2019-01-23 MED ORDER — ROCURONIUM BROMIDE 10 MG/ML (PF) SYRINGE
PREFILLED_SYRINGE | INTRAVENOUS | Status: AC
  Filled 2019-01-23: qty 10

## 2019-01-23 MED ORDER — ALBUTEROL SULFATE (2.5 MG/3ML) 0.083% IN NEBU
2.5000 mg | INHALATION_SOLUTION | Freq: Four times a day (QID) | RESPIRATORY_TRACT | Status: DC | PRN
  Administered 2019-01-23: 2.5 mg via RESPIRATORY_TRACT

## 2019-01-23 MED ORDER — BUPIVACAINE-EPINEPHRINE (PF) 0.5% -1:200000 IJ SOLN
INTRAMUSCULAR | Status: DC | PRN
  Administered 2019-01-23: 20 mL

## 2019-01-23 MED ORDER — SODIUM CHLORIDE 0.9 % IR SOLN
Status: DC | PRN
  Administered 2019-01-23: 1000 mL

## 2019-01-23 MED ORDER — BUPIVACAINE IN DEXTROSE 0.75-8.25 % IT SOLN
INTRATHECAL | Status: AC
  Filled 2019-01-23: qty 2

## 2019-01-23 MED ORDER — PROPOFOL 10 MG/ML IV BOLUS
INTRAVENOUS | Status: AC
  Filled 2019-01-23: qty 20

## 2019-01-23 MED ORDER — CEFAZOLIN SODIUM-DEXTROSE 2-4 GM/100ML-% IV SOLN
2.0000 g | INTRAVENOUS | Status: AC
  Administered 2019-01-23: 2 g via INTRAVENOUS

## 2019-01-23 MED ORDER — OXYCODONE HCL 5 MG PO TABS
10.0000 mg | ORAL_TABLET | Freq: Once | ORAL | Status: AC
  Administered 2019-01-23: 10 mg via ORAL
  Filled 2019-01-23: qty 2

## 2019-01-23 MED ORDER — CHLORHEXIDINE GLUCONATE 4 % EX LIQD: 60.0000 mL | Freq: Once | CUTANEOUS | Status: DC

## 2019-01-23 MED ORDER — MIDAZOLAM HCL 2 MG/2ML IJ SOLN: 0.5000 mg | Freq: Once | INTRAMUSCULAR | Status: DC | PRN

## 2019-01-23 MED ORDER — ROCURONIUM BROMIDE 100 MG/10ML IV SOLN
INTRAVENOUS | Status: DC | PRN
  Administered 2019-01-23: 20 mg via INTRAVENOUS

## 2019-01-23 MED ORDER — OXYCODONE HCL 5 MG PO TABS: ORAL_TABLET | ORAL | 0 refills | Status: DC

## 2019-01-23 MED ORDER — PHENYLEPHRINE HCL (PRESSORS) 10 MG/ML IV SOLN
INTRAVENOUS | Status: DC | PRN
  Administered 2019-01-23 (×3): 40 ug via INTRAVENOUS

## 2019-01-23 MED ORDER — LACTATED RINGERS IV SOLN
INTRAVENOUS | Status: DC
  Administered 2019-01-23: 08:00:00 1000 mL via INTRAVENOUS

## 2019-01-23 MED ORDER — PROMETHAZINE HCL 12.5 MG PO TABS: 12.5000 mg | ORAL_TABLET | Freq: Four times a day (QID) | ORAL | 0 refills | Status: DC | PRN

## 2019-01-23 MED ORDER — SUCCINYLCHOLINE CHLORIDE 20 MG/ML IJ SOLN
INTRAMUSCULAR | Status: DC | PRN
  Administered 2019-01-23: 120 mg via INTRAVENOUS

## 2019-01-23 MED ORDER — ONDANSETRON HCL 4 MG/2ML IJ SOLN
INTRAMUSCULAR | Status: AC
  Filled 2019-01-23: qty 2

## 2019-01-23 MED ORDER — HYDROMORPHONE HCL 1 MG/ML IJ SOLN
0.2500 mg | INTRAMUSCULAR | Status: DC | PRN
  Administered 2019-01-23: 0.5 mg via INTRAVENOUS
  Filled 2019-01-23: qty 0.5

## 2019-01-23 MED ORDER — ALBUTEROL SULFATE (2.5 MG/3ML) 0.083% IN NEBU
INHALATION_SOLUTION | RESPIRATORY_TRACT | Status: AC
  Filled 2019-01-23: qty 3

## 2019-01-23 SURGICAL SUPPLY — 49 items
BENZOIN TINCTURE PRP APPL 2/3 (GAUZE/BANDAGES/DRESSINGS) ×2 IMPLANT
BLADE HEX COATED 2.75 (ELECTRODE) ×2 IMPLANT
BLADE OSC/SAGITTAL MD 9X18.5 (BLADE) ×2 IMPLANT
BLADE SURG 15 STRL LF DISP TIS (BLADE) ×1 IMPLANT
BLADE SURG 15 STRL SS (BLADE) ×1
BNDG COHESIVE 4X5 TAN STRL (GAUZE/BANDAGES/DRESSINGS) ×2 IMPLANT
CHLORAPREP W/TINT 26 (MISCELLANEOUS) ×2 IMPLANT
CLOTH BEACON ORANGE TIMEOUT ST (SAFETY) ×2 IMPLANT
COVER LIGHT HANDLE STERIS (MISCELLANEOUS) ×4 IMPLANT
COVER MAYO STAND XLG (MISCELLANEOUS) ×2 IMPLANT
COVER WAND RF STERILE (DRAPES) ×2 IMPLANT
CUFF CRYO UNI SHDR 32X48 (MISCELLANEOUS) ×2 IMPLANT
DECANTER SPIKE VIAL GLASS SM (MISCELLANEOUS) ×4 IMPLANT
DRAPE ORTHO 2.5IN SPLIT 77X108 (DRAPES) ×2 IMPLANT
DRAPE ORTHO SPLIT 77X108 STRL (DRAPES) ×2
DRAPE U-SHAPE 47X51 STRL (DRAPES) ×2 IMPLANT
DRESSING ALLEVYN BORDER 5X5 (GAUZE/BANDAGES/DRESSINGS) ×2 IMPLANT
ELECT REM PT RETURN 9FT ADLT (ELECTROSURGICAL) ×2
ELECTRODE REM PT RTRN 9FT ADLT (ELECTROSURGICAL) ×1 IMPLANT
GLOVE BIO SURGEON STRL SZ7 (GLOVE) ×2 IMPLANT
GLOVE BIOGEL PI IND STRL 7.0 (GLOVE) ×3 IMPLANT
GLOVE BIOGEL PI INDICATOR 7.0 (GLOVE) ×3
GLOVE ECLIPSE 6.5 STRL STRAW (GLOVE) ×2 IMPLANT
GLOVE SKINSENSE NS SZ8.0 LF (GLOVE) ×1
GLOVE SKINSENSE STRL SZ8.0 LF (GLOVE) ×1 IMPLANT
GLOVE SS N UNI LF 8.5 STRL (GLOVE) ×2 IMPLANT
GOWN STRL REUS W/TWL LRG LVL3 (GOWN DISPOSABLE) ×6 IMPLANT
INST SET MINOR BONE (KITS) ×2 IMPLANT
KIT SURGICAL DEVON (SET/KITS/TRAYS/PACK) ×2 IMPLANT
KIT TURNOVER KIT A (KITS) ×2 IMPLANT
MANIFOLD NEPTUNE II (INSTRUMENTS) ×2 IMPLANT
MARKER SKIN DUAL TIP RULER LAB (MISCELLANEOUS) ×2 IMPLANT
NEEDLE HYPO 21X1.5 SAFETY (NEEDLE) ×2 IMPLANT
NS IRRIG 1000ML POUR BTL (IV SOLUTION) ×2 IMPLANT
PACK BASIC III (CUSTOM PROCEDURE TRAY) ×1
PACK SRG BSC III STRL LF ECLPS (CUSTOM PROCEDURE TRAY) ×1 IMPLANT
PAD ARMBOARD 7.5X6 YLW CONV (MISCELLANEOUS) ×2 IMPLANT
PENCIL HANDSWITCHING (ELECTRODE) ×2 IMPLANT
RASP LG TEAR CROSS CUT (RASP) ×2 IMPLANT
SET BASIN LINEN APH (SET/KITS/TRAYS/PACK) ×2 IMPLANT
SPONGE LAP 18X18 RF (DISPOSABLE) ×2 IMPLANT
STOCKINETTE IMPERVIOUS LG (DRAPES) ×2 IMPLANT
STRIP CLOSURE SKIN 1/2X4 (GAUZE/BANDAGES/DRESSINGS) ×2 IMPLANT
SUT BONE WAX W31G (SUTURE) ×2 IMPLANT
SUT MON AB 0 CT1 (SUTURE) ×2 IMPLANT
SUT MON AB 2-0 CT1 36 (SUTURE) ×2 IMPLANT
SYR 30ML LL (SYRINGE) ×2 IMPLANT
SYR BULB IRRIGATION 50ML (SYRINGE) ×4 IMPLANT
YANKAUER SUCT 12FT TUBE ARGYLE (SUCTIONS) ×2 IMPLANT

## 2019-01-23 NOTE — Discharge Instructions (Signed)

## 2019-01-23 NOTE — Op Note (Addendum)
8:43 AM 01/23/2019  Operative report  Preop diagnosis acromioclavicular joint arthritis right shoulder  Postop diagnosis same   Procedure open distal clavicle excision, 23120  Operative findings arthritis of the acromioclavicular joint with large inferior osteophyte  Surgeon Aline Brochure  Surgical details The patient was seen in the preop area, the surgical site was confirmed the chart was reviewed and the right shoulder was marked for surgery  The patient was taken to surgery where he had 2 g of Ancef IV he was intubated in the supine position  He was placed in the semiupright position where he had a 5 pound sandbag placed on the right shoulder.  He was prepped and draped sterilely and then the timeout was completed with all images reviewed and available  A longitudinal incision was made over the acromioclavicular joint subcutaneous tissue was divided the periosteum of the clavicle and acromion was divided longitudinally in line with the clavicle subperiosteal dissection exposed the distal end of the clavicle.  A ruler was placed to take off 9 mm of distal clavicle.  The oscillating saw was used to remove the 9 mm of clavicle and the posterior cut was chamfered.  The inferior portion of the collarbone was then smoothed with a rasp.  Marcaine with epinephrine was used to inject the soft tissues.   The joint was irrigated hemostasis was obtained bone wax was placed at the end of the clavicle.  The subperiosteal dissection was closed with 0 Monocryl suture  Second irrigation was performed and 0 Monocryl was used to close the subcu tissue followed by running 2-0 Monocryl.  Steri-Strips and benzoin were used to secure the skin edges.  Sterile dressing was applied  Assisted by Corrie Dandy  Anesthesia General  Complications none  Under needle count correct  Patient to PACU in stable condition  Discharge to home when stable

## 2019-01-23 NOTE — Anesthesia Preprocedure Evaluation (Addendum)
Anesthesia Evaluation  Patient identified by MRN, date of birth, ID band Patient awake    Reviewed: Allergy & Precautions, NPO status , Patient's Chart, lab work & pertinent test results  Airway Mallampati: II  TM Distance: >3 FB Neck ROM: Full    Dental no notable dental hx. (+) Missing, Poor Dentition   Pulmonary neg pulmonary ROS, Current Smoker and Patient abstained from smoking.,    Pulmonary exam normal breath sounds clear to auscultation       Cardiovascular Exercise Tolerance: Good hypertension, Normal cardiovascular examI Rhythm:Regular Rate:Normal  States can walk a mile  Denies CABG Had - cath without intervention  States can walk miles  Denies NTG use Pt doesn't use BP meds -DBP ~90s -states anxious   Neuro/Psych  Headaches, Anxiety Depression  Neuromuscular disease negative psych ROS   GI/Hepatic Neg liver ROS, GERD  Medicated and Controlled,  Endo/Other  negative endocrine ROS  Renal/GU Renal InsufficiencyRenal disease  negative genitourinary   Musculoskeletal  (+) Arthritis , Osteoarthritis,    Abdominal   Peds negative pediatric ROS (+)  Hematology negative hematology ROS (+)   Anesthesia Other Findings   Reproductive/Obstetrics negative OB ROS                             Anesthesia Physical Anesthesia Plan  ASA: III  Anesthesia Plan: General   Post-op Pain Management:    Induction: Intravenous  PONV Risk Score and Plan: 1 and Ondansetron, Midazolam and Treatment may vary due to age or medical condition  Airway Management Planned: Oral ETT  Additional Equipment:   Intra-op Plan:   Post-operative Plan: Extubation in OR  Informed Consent: I have reviewed the patients History and Physical, chart, labs and discussed the procedure including the risks, benefits and alternatives for the proposed anesthesia with the patient or authorized representative who has  indicated his/her understanding and acceptance.     Dental advisory given  Plan Discussed with: CRNA  Anesthesia Plan Comments: (Plan Full PPE use Plan GETA D/W PT -WTP with same after Q&A  Pt reports Mountain dew consumption ~530 am -just a little  WTP at min increased risk when explained why NPO status is important )        Anesthesia Quick Evaluation

## 2019-01-23 NOTE — Anesthesia Postprocedure Evaluation (Signed)
Anesthesia Post Note  Patient: Michael [name]  Procedure(s) Performed: OPEN DISTAL CLAVICLE EXCISION (Right Shoulder)  Patient location during evaluation: Phase II Anesthesia Type: General Level of consciousness: awake and alert and patient cooperative Pain management: satisfactory to patient Vital Signs Assessment: post-procedure vital signs reviewed and stable Respiratory status: spontaneous breathing Cardiovascular status: stable Postop Assessment: no apparent nausea or vomiting Anesthetic complications: no     Last Vitals:  Vitals:   01/23/19 0851 01/23/19 0948  BP: (!) 158/89 (!) 142/92  Pulse: 88 77  Resp: (!) 21 18  Temp: 36.6 C 36.5 C  SpO2: 99% 97%    Last Pain:  Vitals:   01/23/19 0948  TempSrc: Oral  PainSc: 0-No pain                 Paxten Appelt

## 2019-01-23 NOTE — Brief Op Note (Signed)
01/23/2019  8:40 AM  PATIENT:  Michael [name]  58 y.o. male  PRE-OPERATIVE DIAGNOSIS:  AC joint arthritis right shoulder  POST-OPERATIVE DIAGNOSIS:  AC joint arthritis right shoulder  PROCEDURE:  Procedure(s): OPEN DISTAL CLAVICLE EXCISION (Right) 23120  Operative findings arthritic acromioclavicular joint with large inferior acromial spur  SURGEON:  Surgeon(s) and Role:    Carole Civil, MD - Primary  PHYSICIAN ASSISTANT:   ASSISTANTS: Corrie Dandy  ANESTHESIA:   general  EBL:  10 mL   BLOOD ADMINISTERED:none  DRAINS: none   LOCAL MEDICATIONS USED:  MARCAINE     SPECIMEN:  No Specimen  DISPOSITION OF SPECIMEN:  N/A  COUNTS:  YES  TOURNIQUET:  * No tourniquets in log *  DICTATION: .Dragon Dictation  PLAN OF CARE: Discharge to home after PACU  PATIENT DISPOSITION:  PACU - hemodynamically stable.   Delay start of Pharmacological VTE agent (>24hrs) due to surgical blood loss or risk of bleeding: na

## 2019-01-23 NOTE — Interval H&P Note (Signed)
History and Physical Interval Note:  01/23/2019 7:22 AM   BP (!) 168/99   Temp 97.8 F (36.6 C) (Oral)   Resp 20   SpO2 98%   CBC Latest Ref Rng & Units 01/21/2019 12/28/2017 07/06/2017  WBC 4.0 - 10.5 K/uL 9.6 9.7 7.8  Hemoglobin 13.0 - 17.0 g/dL 12.7(L) 13.0 13.7  Hematocrit 39.0 - 52.0 % 39.9 39.8 42.7  Platelets 150 - 400 K/uL 286 280 175    BMP Latest Ref Rng & Units 01/21/2019 07/06/2017 06/11/2017  Glucose 70 - 99 mg/dL 106(H) 108(H) 88  BUN 6 - 20 mg/dL 14 19 9   Creatinine 0.61 - 1.24 mg/dL 1.44(H) 1.66(H) 1.50(H)  BUN/Creat Ratio 6 - 22 (calc) - - 6  Sodium 135 - 145 mmol/L 141 140 140  Potassium 3.5 - 5.1 mmol/L 4.5 4.6 4.4  Chloride 98 - 111 mmol/L 108 106 106  CO2 22 - 32 mmol/L 24 23 28   Calcium 8.9 - 10.3 mg/dL 9.2 9.2 10.1     Michael [name]  has presented today for surgery, with the diagnosis of AC joint arthritis right shoulder.  The various methods of treatment have been discussed with the patient and family. After consideration of risks, benefits and other options for treatment, the patient has consented to  Procedure(s): OPEN DISTAL CLAVICLE EXCISION (Right) as a surgical intervention.  The patient's history has been reviewed, patient examined, no change in status, stable for surgery.  I have reviewed the patient's chart and labs.  Questions were answered to the patient's satisfaction.     Arther Abbott

## 2019-01-23 NOTE — Anesthesia Procedure Notes (Signed)
Procedure Name: Intubation Date/Time: 01/23/2019 7:44 AM Performed by: Vista Deck, CRNA Pre-anesthesia Checklist: Patient identified, Patient being monitored, Timeout performed, Emergency Drugs available and Suction available Patient Re-evaluated:Patient Re-evaluated prior to induction Oxygen Delivery Method: Circle System Utilized Preoxygenation: Pre-oxygenation with 100% oxygen Induction Type: IV induction Laryngoscope Size: Mac and 4 Grade View: Grade I Tube type: Oral Tube size: 7.0 mm Number of attempts: 1 Airway Equipment and Method: stylet Placement Confirmation: ETT inserted through vocal cords under direct vision,  positive ETCO2 and breath sounds checked- equal and bilateral Secured at: 23 cm Tube secured with: Tape Dental Injury: Teeth and Oropharynx as per pre-operative assessment

## 2019-01-23 NOTE — Transfer of Care (Signed)
Immediate Anesthesia Transfer of Care Note  Patient: Michael [name]  Procedure(s) Performed: OPEN DISTAL CLAVICLE EXCISION (Right Shoulder)  Patient Location: PACU  Anesthesia Type:General  Level of Consciousness: awake, alert  and patient cooperative  Airway & Oxygen Therapy: Patient Spontanous Breathing  Post-op Assessment: Report given to RN and Post -op Vital signs reviewed and stable  Post vital signs: Reviewed and stable  Last Vitals:  Vitals Value Taken Time  BP 158/89 01/23/2019  0851  Temp 97.8 01/23/2019 0850  Pulse 90 01/23/2019  0851  Resp 20 01/23/2019   0851  SpO2 98 01/23/2019  0851    Last Pain:  Vitals:   01/23/19 0703  TempSrc: Oral  PainSc: 7       Patients Stated Pain Goal: 8 ([id number])  Complications: No apparent anesthesia complications

## 2019-01-24 ENCOUNTER — Encounter (HOSPITAL_COMMUNITY): Payer: Self-pay | Admitting: Orthopedic Surgery

## 2019-01-28 DIAGNOSIS — M25511 Pain in right shoulder: Secondary | ICD-10-CM | POA: Diagnosis not present

## 2019-01-28 DIAGNOSIS — Z09 Encounter for follow-up examination after completed treatment for conditions other than malignant neoplasm: Secondary | ICD-10-CM | POA: Diagnosis not present

## 2019-02-06 DIAGNOSIS — Z9889 Other specified postprocedural states: Secondary | ICD-10-CM | POA: Insufficient documentation

## 2019-02-07 ENCOUNTER — Encounter: Payer: Self-pay | Admitting: Orthopedic Surgery

## 2019-02-07 ENCOUNTER — Ambulatory Visit (INDEPENDENT_AMBULATORY_CARE_PROVIDER_SITE_OTHER): Payer: Medicare Other | Admitting: Orthopedic Surgery

## 2019-02-07 ENCOUNTER — Other Ambulatory Visit: Payer: Self-pay

## 2019-02-07 VITALS — BP 131/117 | HR 68

## 2019-02-07 DIAGNOSIS — Z9889 Other specified postprocedural states: Secondary | ICD-10-CM

## 2019-02-07 MED ORDER — OXYCODONE HCL 10 MG PO TABS: 10.0000 mg | ORAL_TABLET | ORAL | 0 refills | Status: AC | PRN

## 2019-02-07 NOTE — Progress Notes (Signed)
Postop visit status post open distal clavicle excision on October 15  Patient says he is doing reasonably well  He does have chronic pain he would like his oxycodone 10 mg every 4 until he sees pain management  His wound looks good he had absorbable sutures  His range of motion is improving nicely he said at one point he could get his hand over his head although the last week with the rain has caused him more soreness and aching  Everything looks good at this point  Recommend 4 to 6-week follow-up and home exercise program with the [facility]  Meds ordered this encounter  Medications  . Oxycodone HCl 10 MG TABS    Sig: Take 1 tablet (10 mg total) by mouth every 4 (four) hours as needed for up to 7 days.    Dispense:  42 tablet    Refill:  0

## 2019-02-07 NOTE — Patient Instructions (Signed)
Home exercises   Medication until sees pain management   Fu 6 weeks

## 2019-02-19 DIAGNOSIS — G5793 Unspecified mononeuropathy of bilateral lower limbs: Secondary | ICD-10-CM | POA: Diagnosis not present

## 2019-02-19 DIAGNOSIS — H5789 Other specified disorders of eye and adnexa: Secondary | ICD-10-CM | POA: Diagnosis not present

## 2019-02-24 DIAGNOSIS — M5416 Radiculopathy, lumbar region: Secondary | ICD-10-CM | POA: Diagnosis not present

## 2019-02-24 DIAGNOSIS — M961 Postlaminectomy syndrome, not elsewhere classified: Secondary | ICD-10-CM | POA: Diagnosis not present

## 2019-02-26 ENCOUNTER — Telehealth: Payer: Self-pay | Admitting: Orthopedic Surgery

## 2019-02-26 DIAGNOSIS — D869 Sarcoidosis, unspecified: Secondary | ICD-10-CM | POA: Diagnosis not present

## 2019-02-26 DIAGNOSIS — M25511 Pain in right shoulder: Secondary | ICD-10-CM | POA: Diagnosis not present

## 2019-02-26 NOTE — Telephone Encounter (Signed)
Voice message received from patient relaying that his shoulder is still hurting; asking if there is anything that Dr Aline Brochure can prescribe to help with the pain.  Chart note indicates medication until pain management. I called back to patient to obtain more information, reached his voice mail, left message to return call.

## 2019-02-28 ENCOUNTER — Telehealth: Payer: Self-pay | Admitting: Orthopedic Surgery

## 2019-02-28 NOTE — Telephone Encounter (Signed)
Patient called back, again via voice message - I returned call to # [phone]. Message is: Request for something for pain - Chart note indicates 'medication until pain management'  Patient's ph# is [facility], [facility]

## 2019-03-03 DIAGNOSIS — M5127 Other intervertebral disc displacement, lumbosacral region: Secondary | ICD-10-CM | POA: Diagnosis not present

## 2019-03-03 DIAGNOSIS — Z01812 Encounter for preprocedural laboratory examination: Secondary | ICD-10-CM | POA: Diagnosis not present

## 2019-03-03 DIAGNOSIS — M5416 Radiculopathy, lumbar region: Secondary | ICD-10-CM | POA: Diagnosis not present

## 2019-03-03 DIAGNOSIS — M48061 Spinal stenosis, lumbar region without neurogenic claudication: Secondary | ICD-10-CM | POA: Diagnosis not present

## 2019-03-03 DIAGNOSIS — M5126 Other intervertebral disc displacement, lumbar region: Secondary | ICD-10-CM | POA: Diagnosis not present

## 2019-03-03 NOTE — Telephone Encounter (Signed)
I called him to check status of his pain clinic appt, but number in chart is not his, a lady has advised wrong number.

## 2019-03-04 NOTE — Telephone Encounter (Signed)
Unable to reach.

## 2019-03-10 ENCOUNTER — Telehealth: Payer: Self-pay

## 2019-03-10 NOTE — Telephone Encounter (Signed)
Patient left message stating he needed something for pain. Stated he had told Dr. Aline Brochure he was going to pain clinic, but they are not doing anything right now. Stated they want to do a lot of test.  Please call and advise  [phone]

## 2019-03-10 NOTE — Telephone Encounter (Signed)
As stated to him before surgery I CAN NOT HANDLE HIS CHRONIC PAIN MEDS

## 2019-03-10 NOTE — Telephone Encounter (Signed)
I have advised him.

## 2019-03-10 NOTE — Telephone Encounter (Signed)
He went to pain clinic, asking for pain meds from you still Please advise

## 2019-03-20 ENCOUNTER — Inpatient Hospital Stay (HOSPITAL_COMMUNITY)
Admission: EM | Disposition: A | Discharge status: Home / Self Care | Payer: Self-pay | Source: Home / Self Care | Attending: Cardiovascular Disease

## 2019-03-20 ENCOUNTER — Inpatient Hospital Stay (HOSPITAL_COMMUNITY)
Admission: EM | Admit: 2019-03-20 | Discharge: 2019-03-23 | DRG: 251 | Disposition: A | Discharge status: Home / Self Care | Payer: Medicare Other | Attending: Cardiovascular Disease | Admitting: Cardiovascular Disease

## 2019-03-20 DIAGNOSIS — N182 Chronic kidney disease, stage 2 (mild): Secondary | ICD-10-CM | POA: Diagnosis present

## 2019-03-20 DIAGNOSIS — Z20828 Contact with and (suspected) exposure to other viral communicable diseases: Secondary | ICD-10-CM | POA: Diagnosis present

## 2019-03-20 DIAGNOSIS — D869 Sarcoidosis, unspecified: Secondary | ICD-10-CM | POA: Diagnosis present

## 2019-03-20 DIAGNOSIS — D860 Sarcoidosis of lung: Secondary | ICD-10-CM | POA: Diagnosis not present

## 2019-03-20 DIAGNOSIS — R0789 Other chest pain: Secondary | ICD-10-CM | POA: Diagnosis not present

## 2019-03-20 DIAGNOSIS — F329 Major depressive disorder, single episode, unspecified: Secondary | ICD-10-CM | POA: Diagnosis present

## 2019-03-20 DIAGNOSIS — E785 Hyperlipidemia, unspecified: Secondary | ICD-10-CM | POA: Diagnosis present

## 2019-03-20 DIAGNOSIS — Z8249 Family history of ischemic heart disease and other diseases of the circulatory system: Secondary | ICD-10-CM | POA: Diagnosis not present

## 2019-03-20 DIAGNOSIS — F17200 Nicotine dependence, unspecified, uncomplicated: Secondary | ICD-10-CM | POA: Diagnosis present

## 2019-03-20 DIAGNOSIS — I2510 Atherosclerotic heart disease of native coronary artery without angina pectoris: Secondary | ICD-10-CM | POA: Diagnosis not present

## 2019-03-20 DIAGNOSIS — F17210 Nicotine dependence, cigarettes, uncomplicated: Secondary | ICD-10-CM | POA: Diagnosis present

## 2019-03-20 DIAGNOSIS — I213 ST elevation (STEMI) myocardial infarction of unspecified site: Secondary | ICD-10-CM | POA: Diagnosis present

## 2019-03-20 DIAGNOSIS — Z800 Family history of malignant neoplasm of digestive organs: Secondary | ICD-10-CM

## 2019-03-20 DIAGNOSIS — I2119 ST elevation (STEMI) myocardial infarction involving other coronary artery of inferior wall: Secondary | ICD-10-CM | POA: Diagnosis not present

## 2019-03-20 DIAGNOSIS — Z96643 Presence of artificial hip joint, bilateral: Secondary | ICD-10-CM | POA: Diagnosis not present

## 2019-03-20 DIAGNOSIS — Z9861 Coronary angioplasty status: Secondary | ICD-10-CM

## 2019-03-20 DIAGNOSIS — I2121 ST elevation (STEMI) myocardial infarction involving left circumflex coronary artery: Principal | ICD-10-CM | POA: Diagnosis present

## 2019-03-20 DIAGNOSIS — R0689 Other abnormalities of breathing: Secondary | ICD-10-CM | POA: Diagnosis not present

## 2019-03-20 DIAGNOSIS — I499 Cardiac arrhythmia, unspecified: Secondary | ICD-10-CM | POA: Diagnosis not present

## 2019-03-20 DIAGNOSIS — R079 Chest pain, unspecified: Secondary | ICD-10-CM | POA: Diagnosis not present

## 2019-03-20 HISTORY — PX: LEFT HEART CATH AND CORONARY ANGIOGRAPHY: CATH118249

## 2019-03-20 HISTORY — PX: CORONARY/GRAFT ACUTE MI REVASCULARIZATION: CATH118305

## 2019-03-20 LAB — COMPREHENSIVE METABOLIC PANEL
ALT: 24 U/L (ref 0–44)
AST: 23 U/L (ref 15–41)
Albumin: 3.2 g/dL — ABNORMAL LOW (ref 3.5–5.0)
Alkaline Phosphatase: 70 U/L (ref 38–126)
Anion gap: 9 (ref 5–15)
BUN: 16 mg/dL (ref 6–20)
CO2: 23 mmol/L (ref 22–32)
Calcium: 8.8 mg/dL — ABNORMAL LOW (ref 8.9–10.3)
Chloride: 108 mmol/L (ref 98–111)
Creatinine, Ser: 1.69 mg/dL — ABNORMAL HIGH (ref 0.61–1.24)
GFR calc Af Amer: 51 mL/min — ABNORMAL LOW (ref >60)
GFR calc non Af Amer: 44 mL/min — ABNORMAL LOW (ref >60)
Glucose, Bld: 97 mg/dL (ref 70–99)
Potassium: 3.6 mmol/L (ref 3.5–5.1)
Sodium: 140 mmol/L (ref 135–145)
Total Bilirubin: 0.5 mg/dL (ref 0.3–1.2)
Total Protein: 6 g/dL — ABNORMAL LOW (ref 6.5–8.1)

## 2019-03-20 LAB — CBC WITH DIFFERENTIAL/PLATELET
Abs Immature Granulocytes: 1.07 10*3/uL — ABNORMAL HIGH (ref 0.00–0.07)
Basophils Absolute: 0.1 10*3/uL (ref 0.0–0.1)
Basophils Relative: 1 %
Eosinophils Absolute: 0.2 10*3/uL (ref 0.0–0.5)
Eosinophils Relative: 1 %
HCT: 37.5 % — ABNORMAL LOW (ref 39.0–52.0)
Hemoglobin: 12.1 g/dL — ABNORMAL LOW (ref 13.0–17.0)
Immature Granulocytes: 6 %
Lymphocytes Relative: 36 %
Lymphs Abs: 5.9 10*3/uL — ABNORMAL HIGH (ref 0.7–4.0)
MCH: 29.8 pg (ref 26.0–34.0)
MCHC: 32.3 g/dL (ref 30.0–36.0)
MCV: 92.4 fL (ref 80.0–100.0)
Monocytes Absolute: 1.4 10*3/uL — ABNORMAL HIGH (ref 0.1–1.0)
Monocytes Relative: 8 %
Neutro Abs: 8 10*3/uL — ABNORMAL HIGH (ref 1.7–7.7)
Neutrophils Relative %: 48 %
Platelets: 288 10*3/uL (ref 150–400)
RBC: 4.06 MIL/uL — ABNORMAL LOW (ref 4.22–5.81)
RDW: 15.6 % — ABNORMAL HIGH (ref 11.5–15.5)
WBC: 16.5 10*3/uL — ABNORMAL HIGH (ref 4.0–10.5)
nRBC: 0.1 % (ref 0.0–0.2)

## 2019-03-20 LAB — LIPID PANEL
Cholesterol: 173 mg/dL (ref 0–200)
HDL: 48 mg/dL (ref >40)
LDL Cholesterol: 97 mg/dL (ref 0–99)
Total CHOL/HDL Ratio: 3.6 RATIO
Triglycerides: 140 mg/dL (ref <150)
VLDL: 28 mg/dL (ref 0–40)

## 2019-03-20 LAB — APTT: aPTT: 24 seconds (ref 24–36)

## 2019-03-20 LAB — RESPIRATORY PANEL BY RT PCR (FLU A&B, COVID)
Influenza A by PCR: NEGATIVE
Influenza B by PCR: NEGATIVE
SARS Coronavirus 2 by RT PCR: NEGATIVE

## 2019-03-20 LAB — TROPONIN I (HIGH SENSITIVITY): Troponin I (High Sensitivity): 138 ng/L (ref <18)

## 2019-03-20 LAB — PROTIME-INR
INR: 0.9 (ref 0.8–1.2)
Prothrombin Time: 12.4 seconds (ref 11.4–15.2)

## 2019-03-20 SURGERY — CORONARY/GRAFT ACUTE MI REVASCULARIZATION
Anesthesia: LOCAL

## 2019-03-20 MED ORDER — CHLORHEXIDINE GLUCONATE CLOTH 2 % EX PADS
6.0000 | MEDICATED_PAD | Freq: Every day | CUTANEOUS | Status: DC
  Administered 2019-03-21: 6 via TOPICAL

## 2019-03-20 MED ORDER — HEPARIN (PORCINE) IN NACL 1000-0.9 UT/500ML-% IV SOLN
INTRAVENOUS | Status: AC
  Filled 2019-03-20: qty 1000

## 2019-03-20 MED ORDER — FENTANYL CITRATE (PF) 100 MCG/2ML IJ SOLN
INTRAMUSCULAR | Status: AC
  Filled 2019-03-20: qty 2

## 2019-03-20 MED ORDER — ASPIRIN 81 MG PO CHEW
324.0000 mg | CHEWABLE_TABLET | Freq: Once | ORAL | Status: AC
  Administered 2019-03-20: 22:00:00 324 mg via ORAL

## 2019-03-20 MED ORDER — ONDANSETRON HCL 4 MG/2ML IJ SOLN: 4.0000 mg | Freq: Four times a day (QID) | INTRAMUSCULAR | Status: DC | PRN

## 2019-03-20 MED ORDER — DIAZEPAM 5 MG PO TABS: 5.0000 mg | ORAL_TABLET | Freq: Four times a day (QID) | ORAL | Status: DC | PRN

## 2019-03-20 MED ORDER — ASPIRIN EC 81 MG PO TBEC
81.0000 mg | DELAYED_RELEASE_TABLET | Freq: Every day | ORAL | Status: DC
  Filled 2019-03-20: qty 1

## 2019-03-20 MED ORDER — HEPARIN SODIUM (PORCINE) 1000 UNIT/ML IJ SOLN
INTRAMUSCULAR | Status: AC
  Filled 2019-03-20: qty 1

## 2019-03-20 MED ORDER — ZOLPIDEM TARTRATE 5 MG PO TABS: 5.0000 mg | ORAL_TABLET | Freq: Every evening | ORAL | Status: DC | PRN

## 2019-03-20 MED ORDER — ACETAMINOPHEN 325 MG PO TABS
650.0000 mg | ORAL_TABLET | ORAL | Status: DC | PRN
  Filled 2019-03-20: qty 2

## 2019-03-20 MED ORDER — ACETAMINOPHEN 325 MG PO TABS
650.0000 mg | ORAL_TABLET | ORAL | Status: DC | PRN
  Administered 2019-03-21: 01:00:00 650 mg via ORAL
  Filled 2019-03-20: qty 2

## 2019-03-20 MED ORDER — HEPARIN SODIUM (PORCINE) 1000 UNIT/ML IJ SOLN
INTRAMUSCULAR | Status: DC | PRN
  Administered 2019-03-20: 3000 [IU] via INTRAVENOUS
  Administered 2019-03-20: 6000 [IU] via INTRAVENOUS

## 2019-03-20 MED ORDER — NITROGLYCERIN 1 MG/10 ML FOR IR/CATH LAB
INTRA_ARTERIAL | Status: DC | PRN
  Administered 2019-03-20 (×3): 200 ug via INTRACORONARY

## 2019-03-20 MED ORDER — TICAGRELOR 90 MG PO TABS
ORAL_TABLET | ORAL | Status: AC
  Filled 2019-03-20: qty 2

## 2019-03-20 MED ORDER — MIDAZOLAM HCL 2 MG/2ML IJ SOLN
INTRAMUSCULAR | Status: DC | PRN
  Administered 2019-03-20: 2 mg via INTRAVENOUS
  Administered 2019-03-20: 1 mg via INTRAVENOUS

## 2019-03-20 MED ORDER — FENTANYL CITRATE (PF) 100 MCG/2ML IJ SOLN
INTRAMUSCULAR | Status: DC | PRN
  Administered 2019-03-20: 25 ug via INTRAVENOUS
  Administered 2019-03-20: 50 ug via INTRAVENOUS

## 2019-03-20 MED ORDER — TICAGRELOR 90 MG PO TABS: 90.0000 mg | ORAL_TABLET | Freq: Two times a day (BID) | ORAL | Status: DC

## 2019-03-20 MED ORDER — VERAPAMIL HCL 2.5 MG/ML IV SOLN
INTRAVENOUS | Status: DC | PRN
  Administered 2019-03-20: 10 mL via INTRA_ARTERIAL

## 2019-03-20 MED ORDER — TIROFIBAN HCL IN NACL 5-0.9 MG/100ML-% IV SOLN
INTRAVENOUS | Status: AC | PRN
  Administered 2019-03-20: 0.15 ug/kg/min via INTRAVENOUS

## 2019-03-20 MED ORDER — TICAGRELOR 90 MG PO TABS
ORAL_TABLET | ORAL | Status: DC | PRN
  Administered 2019-03-20: 180 mg via ORAL

## 2019-03-20 MED ORDER — TIROFIBAN (AGGRASTAT) BOLUS VIA INFUSION
INTRAVENOUS | Status: DC | PRN
  Administered 2019-03-20: 2725 ug via INTRAVENOUS

## 2019-03-20 MED ORDER — ATORVASTATIN CALCIUM 80 MG PO TABS
80.0000 mg | ORAL_TABLET | Freq: Every day | ORAL | Status: DC
  Administered 2019-03-21 – 2019-03-22 (×2): 80 mg via ORAL
  Filled 2019-03-20: qty 1

## 2019-03-20 MED ORDER — VERAPAMIL HCL 2.5 MG/ML IV SOLN
INTRAVENOUS | Status: AC
  Filled 2019-03-20: qty 2

## 2019-03-20 MED ORDER — MIDAZOLAM HCL 2 MG/2ML IJ SOLN
INTRAMUSCULAR | Status: AC
  Filled 2019-03-20: qty 2

## 2019-03-20 MED ORDER — LIDOCAINE HCL (PF) 1 % IJ SOLN
INTRAMUSCULAR | Status: AC
  Filled 2019-03-20: qty 30

## 2019-03-20 MED ORDER — TIROFIBAN HCL IN NACL 5-0.9 MG/100ML-% IV SOLN
INTRAVENOUS | Status: AC
  Filled 2019-03-20: qty 100

## 2019-03-20 MED ORDER — SODIUM CHLORIDE 0.9 % IV SOLN
INTRAVENOUS | Status: DC
  Administered 2019-03-20: 23:00:00 via INTRAVENOUS

## 2019-03-20 MED ORDER — NITROGLYCERIN IN D5W 200-5 MCG/ML-% IV SOLN: 0.0000 ug/min | INTRAVENOUS | Status: DC

## 2019-03-20 MED ORDER — LABETALOL HCL 5 MG/ML IV SOLN
10.0000 mg | INTRAVENOUS | Status: AC | PRN
  Filled 2019-03-20: qty 4

## 2019-03-20 MED ORDER — HYDRALAZINE HCL 20 MG/ML IJ SOLN: 10.0000 mg | INTRAMUSCULAR | Status: AC | PRN

## 2019-03-20 MED ORDER — SODIUM CHLORIDE 0.9 % IV SOLN
INTRAVENOUS | Status: DC
  Administered 2019-03-21: via INTRAVENOUS

## 2019-03-20 MED ORDER — ASPIRIN 81 MG PO CHEW
81.0000 mg | CHEWABLE_TABLET | Freq: Every day | ORAL | Status: DC
  Administered 2019-03-21 – 2019-03-23 (×3): 81 mg via ORAL
  Filled 2019-03-20 (×3): qty 1

## 2019-03-20 MED ORDER — TIROFIBAN HCL IV 12.5 MG/250 ML
0.0750 ug/kg/min | INTRAVENOUS | Status: AC
  Administered 2019-03-21: 0.075 ug/kg/min via INTRAVENOUS
  Filled 2019-03-20: qty 250

## 2019-03-20 MED ORDER — SODIUM CHLORIDE 0.9% FLUSH: 3.0000 mL | INTRAVENOUS | Status: DC | PRN

## 2019-03-20 MED ORDER — ROSUVASTATIN CALCIUM 5 MG PO TABS
5.0000 mg | ORAL_TABLET | Freq: Every day | ORAL | Status: DC
  Filled 2019-03-20: qty 1

## 2019-03-20 MED ORDER — LIDOCAINE HCL (PF) 1 % IJ SOLN
INTRAMUSCULAR | Status: DC | PRN
  Administered 2019-03-20: 2 mL

## 2019-03-20 MED ORDER — SODIUM CHLORIDE 0.9% FLUSH
3.0000 mL | Freq: Two times a day (BID) | INTRAVENOUS | Status: DC
  Administered 2019-03-21 – 2019-03-22 (×4): 3 mL via INTRAVENOUS

## 2019-03-20 MED ORDER — NITROGLYCERIN 0.4 MG SL SUBL: 0.4000 mg | SUBLINGUAL_TABLET | SUBLINGUAL | Status: DC | PRN

## 2019-03-20 MED ORDER — SODIUM CHLORIDE 0.9 % IV SOLN: 250.0000 mL | INTRAVENOUS | Status: DC | PRN

## 2019-03-20 MED ORDER — NITROGLYCERIN IN D5W 200-5 MCG/ML-% IV SOLN
0.0000 ug/min | INTRAVENOUS | Status: DC
  Administered 2019-03-20: 22:00:00 10 ug/min via INTRAVENOUS

## 2019-03-20 MED ORDER — HEPARIN (PORCINE) IN NACL 1000-0.9 UT/500ML-% IV SOLN
INTRAVENOUS | Status: DC | PRN
  Administered 2019-03-20: 500 mL

## 2019-03-20 MED ORDER — IOHEXOL 350 MG/ML SOLN
INTRAVENOUS | Status: DC | PRN
  Administered 2019-03-20: 240 mL

## 2019-03-20 MED ORDER — HEPARIN SODIUM (PORCINE) 5000 UNIT/ML IJ SOLN
4000.0000 [IU] | Freq: Once | INTRAMUSCULAR | Status: AC
  Administered 2019-03-20: 22:00:00 4000 [IU] via INTRAVENOUS

## 2019-03-20 MED ORDER — NITROGLYCERIN 1 MG/10 ML FOR IR/CATH LAB
INTRA_ARTERIAL | Status: AC
  Filled 2019-03-20: qty 10

## 2019-03-20 MED ORDER — TICAGRELOR 90 MG PO TABS
90.0000 mg | ORAL_TABLET | Freq: Two times a day (BID) | ORAL | Status: DC
  Administered 2019-03-21 – 2019-03-23 (×5): 90 mg via ORAL
  Filled 2019-03-20 (×5): qty 1

## 2019-03-20 SURGICAL SUPPLY — 15 items
BALLN SAPPHIRE 2.0X12 (BALLOONS) ×2
BALLOON SAPPHIRE 2.0X12 (BALLOONS) ×1 IMPLANT
CATH OPTITORQUE TIG 4.0 5F (CATHETERS) ×2 IMPLANT
CATH VISTA GUIDE 6FR JR4 (CATHETERS) ×2 IMPLANT
CATH VISTA GUIDE 6FR XB3.5 (CATHETERS) ×2 IMPLANT
DEVICE RAD COMP TR BAND LRG (VASCULAR PRODUCTS) ×2 IMPLANT
GLIDESHEATH SLEND SS 6F .021 (SHEATH) ×2 IMPLANT
GUIDEWIRE INQWIRE 1.5J.035X260 (WIRE) ×1 IMPLANT
INQWIRE 1.5J .035X260CM (WIRE) ×2
KIT ENCORE 26 ADVANTAGE (KITS) ×2 IMPLANT
KIT HEART LEFT (KITS) ×2 IMPLANT
PACK CARDIAC CATHETERIZATION (CUSTOM PROCEDURE TRAY) ×2 IMPLANT
TRANSDUCER W/STOPCOCK (MISCELLANEOUS) ×2 IMPLANT
TUBING CIL FLEX 10 FLL-RA (TUBING) ×2 IMPLANT
WIRE COUGAR XT STRL 190CM (WIRE) ×2 IMPLANT

## 2019-03-20 NOTE — H&P (Addendum)
Cardiology Admission History and Physical:   Patient ID: Michael [name] MRN: [id number]; DOB: 1960-07-04   Admission date: 03/20/2019  Primary Care Provider: Neale Burly, MD Primary Cardiologist: No primary care provider on file.  Primary Electrophysiologist:  None   Chief Complaint:  Chest pain  Patient Profile:   Michael [name] is a 58 y.o. active smoker with known pulmonary sarcoidosis (on chronic prednisone) and CKD (BL Cr ~ 1.5?) who presents with CP found to have inferior STE.   History of Present Illness:   Michael [name] developed CP tonight around 6PM after washing his car. He described it as a burning sensation that radiated to his bilateral arms and to his back. EMS was activated and a 12 lead EKG en route was notable for inferior STE.   Upon arrival to the [facility] he was hypertensive to 180s/100s with ongoing 6/10 CP. He was given 325 ASA (allergy noted, but per patient cannot take due to CKD) and 4000U of heparin before being brought to the [facility] cath lab.   Past Medical History:  Diagnosis Date  . Allergic rhinitis   . Arthritis   . Chronic low back pain    follwed in Pain Clinic(Dr.Kirchmayer)  . Depression   . Depression with anxiety   . GERD (gastroesophageal reflux disease)   . History of kidney stones    passed  . Insomnia   . Migraines   . Sarcoidosis    Multisystem,pulmonary and hepatic (Dr.Rourk)Liver bx in 06 mildly postive AMA but on re-check normal  . Sleep apnea     Past Surgical History:  Procedure Laterality Date  . BACK SURGERY  2012   2nd back surgery 3 monthes ago and in 2007  . COLONOSCOPY  11/05/2003   [id number] internal hemorrhoids, otherwise normal rectum and colon  . CORONARY ARTERY BYPASS GRAFT  2017  . ESOPHAGOGASTRODUODENOSCOPY  03/22/2004   [id number] esophagogastroduodenoscopy. Small hiatal hernia otherwise normal stomach  . HARVEST BONE GRAFT Left    thigh  . HIP ARTHROPLASTY Bilateral    left 2017, right 2016  . HIP SURGERY  Left 2017   bone graft  . LAMINECTOMY  2009   Balateral L4-L5  . RESECTION DISTAL CLAVICAL Right 01/23/2019   Procedure: OPEN DISTAL CLAVICLE EXCISION;  Surgeon: Carole Civil, MD;  Location: [facility];  Service: Orthopedics;  Laterality: Right;  . TOOTH EXTRACTION Bilateral 12/28/2017   Procedure: DENTAL RESTORATION/EXTRACTIONS;  Surgeon: Diona Browner, DDS;  Location: [facility];  Service: Oral Surgery;  Laterality: Bilateral;     Medications Prior to Admission: Prior to Admission medications   Medication Sig Start Date End Date Taking? Authorizing Provider  albuterol (PROVENTIL) (2.5 MG/3ML) 0.083% nebulizer solution Take 2.5 mg by nebulization every 6 (six) hours as needed for wheezing or shortness of breath.     [provider]  atorvastatin (LIPITOR) 20 MG tablet Take 20 mg by mouth daily.    [provider]  hydrOXYzine (ATARAX/VISTARIL) 25 MG tablet Take 25 mg by mouth every 8 (eight) hours.    [provider]  pregabalin (LYRICA) 200 MG capsule Take 200 mg by mouth 3 (three) times daily.     [provider]  promethazine (PHENERGAN) 12.5 MG tablet Take 1 tablet (12.5 mg total) by mouth every 6 (six) hours as needed for nausea or vomiting. 01/23/19   Carole Civil, MD  venlafaxine XR (EFFEXOR-XR) 37.5 MG 24 hr capsule Take 37.5 mg by mouth daily with breakfast.  [provider]     Allergies:    Allergies  Allergen Reactions  . Aspirin Other (See Comments)    REACTION: "Reacts with sarcoidosis"  . Methadone Other (See Comments)    REACTION: "Become mentally unstable"    Social History:   Social History   Socioeconomic History  . Marital status: Legally Separated    Spouse name: Not on file  . Number of children: Not on file  . Years of education: Not on file  . Highest education level: Not on file  Occupational History  . Not on file  Tobacco Use  . Smoking status: Current Every Day Smoker    Packs/day: 0.50     Years: 29.00    Pack years: 14.50    Types: Cigarettes  . Smokeless tobacco: Never Used  Substance and Sexual Activity  . Alcohol use: Yes    Comment: ocassional  . Drug use: No  . Sexual activity: Not on file  Other Topics Concern  . Not on file  Social History Narrative  . Not on file   Social Determinants of Health   Financial Resource Strain:   . Difficulty of Paying Living Expenses: Not on file  Food Insecurity:   . Worried About Charity fundraiser in the Last Year: Not on file  . Ran Out of Food in the Last Year: Not on file  Transportation Needs:   . Lack of Transportation (Medical): Not on file  . Lack of Transportation (Non-Medical): Not on file  Physical Activity:   . Days of Exercise per Week: Not on file  . Minutes of Exercise per Session: Not on file  Stress:   . Feeling of Stress : Not on file  Social Connections:   . Frequency of Communication with Friends and Family: Not on file  . Frequency of Social Gatherings with Friends and Family: Not on file  . Attends Religious Services: Not on file  . Active Member of Clubs or Organizations: Not on file  . Attends Archivist Meetings: Not on file  . Marital Status: Not on file  Intimate Partner Violence:   . Fear of Current or Ex-Partner: Not on file  . Emotionally Abused: Not on file  . Physically Abused: Not on file  . Sexually Abused: Not on file    Family History:   The patient's family history includes Colon cancer in his brother.  Strong family history of CAD in siblings.   Review of Systems: [y] = yes, [ ]  = no     General: Weight gain [ ] ; Weight loss [ ] ; Anorexia [ ] ; Fatigue [ ] ; Fever [ ] ; Chills [ ] ; Weakness [ ]    Cardiac: Chest pain/pressure Blue.Reese ]; Resting SOB [ ] ; Exertional SOB [ ] ; Orthopnea [ ] ; Pedal Edema [ ] ; Palpitations [ ] ; Syncope [ ] ; Presyncope [ ] ; Paroxysmal nocturnal dyspnea[ ]    Pulmonary: Cough [ ] ; Wheezing[ ] ; Hemoptysis[ ] ; Sputum [ ] ; Snoring [ ]    GI:  Vomiting[ ] ; Dysphagia[ ] ; Melena[ ] ; Hematochezia [ ] ; Heartburn[ ] ; Abdominal pain [ ] ; Constipation [ ] ; Diarrhea [ ] ; BRBPR [ ]    GU: Hematuria[ ] ; Dysuria [ ] ; Nocturia[ ]    Vascular: Pain in legs with walking [ ] ; Pain in feet with lying flat [ ] ; Non-healing sores [ ] ; Stroke [ ] ; TIA [ ] ; Slurred speech [ ] ;   Neuro: Headaches[ ] ; Vertigo[ ] ; Seizures[ ] ; Paresthesias[ ] ;Blurred vision [ ] ; Diplopia [ ] ;  Vision changes [ ]    Ortho/Skin: Arthritis [ ] ; Joint pain [ ] ; Muscle pain [ ] ; Joint swelling [ ] ; Back Pain [ ] ; Rash [ ]    Psych: Depression[ ] ; Anxiety[ ]    Heme: Bleeding problems [ ] ; Clotting disorders [ ] ; Anemia [ ]    Endocrine: Diabetes [ ] ; Thyroid dysfunction[ ]   Physical Exam/Data:   Vitals:   03/20/19 2125 03/20/19 2128 03/20/19 2130  BP: (!) 158/105    Pulse: 67  77  Resp: 12  19  Temp:   97.7 F (36.5 C)  TempSrc:   Oral  SpO2: 95%  100%  Weight:  109 kg   Height:  6\' 2"  (1.88 m)    No intake or output data in the 24 hours ending 03/20/19 2150 Filed Weights   03/20/19 2128  Weight: 109 kg   Body mass index is 30.85 kg/m.  General:  Well nourished, well developed, uncomfortable.  HEENT: normal Neck: no JVD Vascular: No carotid bruits; FA pulses 2+ bilaterally without bruits  Cardiac:  normal S1, S2; RRR; no murmur. Lungs:  clear to auscultation bilaterally, no wheezing, rhonchi or rales  Abd: soft, nontender, no hepatomegaly Ext: no LE edema Musculoskeletal:  No deformities, BUE and BLE strength normal and equal Skin: warm and dry  Neuro:  CNs 2-12 intact, no focal abnormalities noted Psych:  Normal affect   EKG:  The ECG that was done was notable for SR with marked ST in II, III, AVF with reciprocal depressions in V1, V2.   Relevant CV Studies: n/a  Laboratory Data:  ChemistryNo results for input(s): NA, K, CL, CO2, GLUCOSE, BUN, CREATININE, CALCIUM, GFRNONAA, GFRAA, ANIONGAP in the last 168 hours.  No results for input(s): PROT,  ALBUMIN, AST, ALT, ALKPHOS, BILITOT in the last 168 hours. Hematology Recent Labs  Lab 03/20/19 2129  WBC 16.5*  RBC 4.06*  HGB 12.1*  HCT 37.5*  MCV 92.4  MCH 29.8  MCHC 32.3  RDW 15.6*  PLT 288   Cardiac EnzymesNo results for input(s): TROPONINI in the last 168 hours. No results for input(s): TROPIPOC in the last 168 hours.  BNPNo results for input(s): BNP, PROBNP in the last 168 hours.  DDimer No results for input(s): DDIMER in the last 168 hours.  Radiology/Studies:  No results found.  Assessment and Plan:   Michael [name] is a 58 year old active smoker with a history of pulmonary sarcoidosis and CKD II who presents with chest pain found to have inferolateral ST elevations. In the lab, found to have thrombotic distal disease of LAD and LCx. S/p POBA to LCx. No DES placed. TIMI III flow in LAD and TIMI II flow in LCx with remaining distal step off improving by end of case. Plan to continue G2B3A overnight and DAPT in the short term.   #STEMI -- ASA 81mg  daily, Brillinta 180mg  given.  -- On nitro gtt for ongoing CP.  -- Continue Brillinta 90mg  BID.  -- Aggrastat to run for 18 hours. -- Lipid panel and HbA1c to risk stratify -- Complete TTE in the AM with bubble.  -- Add low dose rosuvastatin (apparently with poor reaction to atorvastatin).  -- Will need aggressive risk factor modification and smoking cessation.  -- Send tox screen.   #Pulmonary Sarcoidosis -- Continue home prednisone 20mg  daily -- Will need to discuss whether he needs prophylaxis if this is his chronic daily dose.  #CKD II -- Send urinalysis, avoid nephrotoxins.   Severity of Illness: The appropriate  patient status for this patient is INPATIENT. Inpatient status is judged to be reasonable and necessary in order to provide the required intensity of service to ensure the patient's safety. The patient's presenting symptoms, physical exam findings, and initial radiographic and laboratory data in the context of  their chronic comorbidities is felt to place them at high risk for further clinical deterioration. Furthermore, it is not anticipated that the patient will be medically stable for discharge from the hospital within 2 midnights of admission. The following factors support the patient status of inpatient.   " The patient's presenting symptoms include chest pain.  " The worrisome physical exam findings include chest pain.  " The initial radiographic and laboratory data are worrisome because of ST elevations.  " The chronic co-morbidities include pulmonary sarcoidosis, CKD.    * I certify that at the point of admission it is my clinical judgment that the patient will require inpatient hospital care spanning beyond 2 midnights from the point of admission due to high intensity of service, high risk for further deterioration and high frequency of surveillance required.*    For questions or updates, please contact [facility] Please consult [email or web address] for contact info under    Signed, Milus Banister, MD  03/20/2019 9:50 PM    Patient seen and examined. Agree with assessment and plan. MichaelAndrews is a 58 year old African-American male who has a greater than 25-year history of tobacco use.  He has a history of pulmonary sarcoidosis and is on prednisone 20 mg daily, mild renal insufficiency with prior creatinine at 1.4.  Reportedly he had undergone cardiac catheterization in [facility] approximately 5 years ago and only had 20% narrowing in his LAD.  This evening after washing his car he smoked a cigarette and shortly thereafter developed severe substernal chest discomfort radiating to bilateral arms and to the back of his neck.  EMS was called and a code STEMI was activated in the field with 1 mm of inferolateral early ST elevation.  Upon arrival to the emergency room the patient was still having 6 out of 10 chest pain and was hypertensive.  He was treated with heparin 4000 units, given aspirin 325  mg brought emergently up to the catheterization laboratory for emergent cardiac catheterization.   Troy Sine, MD, [facility] 03/20/2019 11:16 PM

## 2019-03-20 NOTE — ED Triage Notes (Signed)
Pt arrives with c/o cp since yesterday around 1800, given 0.4 nitro and 6 morphine en route. No asa en route. Pain 8/10 on arrival.

## 2019-03-20 NOTE — ED Provider Notes (Signed)
[facility] Emergency Department Provider Note MRN:  [id number]  Arrival date & time: 03/20/19     Chief Complaint   Code STEMI   History of Present Illness   Michael [name] is a 58 y.o. year-old male with a history of sleep apnea, sarcoidosis presenting to the ED with chief complaint of code STEMI.  Patient was at work this morning doing some exertional activity when he began having central chest pain.  Described as a soreness.  Constant since then.  Code STEMI initiated prior to arrival.  Review of Systems  Positive for chest pain.  Patient's Health History    Past Medical History:  Diagnosis Date  . Allergic rhinitis   . Arthritis   . Chronic low back pain    follwed in Pain Clinic(Dr.Kirchmayer)  . Depression   . Depression with anxiety   . GERD (gastroesophageal reflux disease)   . History of kidney stones    passed  . Insomnia   . Migraines   . Sarcoidosis    Multisystem,pulmonary and hepatic (Dr.Rourk)Liver bx in 06 mildly postive AMA but on re-check normal  . Sleep apnea     Past Surgical History:  Procedure Laterality Date  . BACK SURGERY  2012   2nd back surgery 3 monthes ago and in 2007  . COLONOSCOPY  11/05/2003   [id number] internal hemorrhoids, otherwise normal rectum and colon  . CORONARY ARTERY BYPASS GRAFT  2017  . ESOPHAGOGASTRODUODENOSCOPY  03/22/2004   [id number] esophagogastroduodenoscopy. Small hiatal hernia otherwise normal stomach  . HARVEST BONE GRAFT Left    thigh  . HIP ARTHROPLASTY Bilateral    left 2017, right 2016  . HIP SURGERY Left 2017   bone graft  . LAMINECTOMY  2009   Balateral L4-L5  . RESECTION DISTAL CLAVICAL Right 01/23/2019   Procedure: OPEN DISTAL CLAVICLE EXCISION;  Surgeon: Carole Civil, MD;  Location: [facility];  Service: Orthopedics;  Laterality: Right;  . TOOTH EXTRACTION Bilateral 12/28/2017   Procedure: DENTAL RESTORATION/EXTRACTIONS;  Surgeon: Diona Browner, DDS;  Location: [facility];   Service: Oral Surgery;  Laterality: Bilateral;    Family History  Problem Relation Age of Onset  . Colon cancer Brother        in his 50s    Social History   Socioeconomic History  . Marital status: Legally Separated    Spouse name: Not on file  . Number of children: Not on file  . Years of education: Not on file  . Highest education level: Not on file  Occupational History  . Not on file  Tobacco Use  . Smoking status: Current Every Day Smoker    Packs/day: 0.50    Years: 29.00    Pack years: 14.50    Types: Cigarettes  . Smokeless tobacco: Never Used  Substance and Sexual Activity  . Alcohol use: Yes    Comment: ocassional  . Drug use: No  . Sexual activity: Not on file  Other Topics Concern  . Not on file  Social History Narrative  . Not on file   Social Determinants of Health   Financial Resource Strain:   . Difficulty of Paying Living Expenses: Not on file  Food Insecurity:   . Worried About Charity fundraiser in the Last Year: Not on file  . Ran Out of Food in the Last Year: Not on file  Transportation Needs:   . Lack of Transportation (Medical): Not on file  . Lack of Transportation (Non-Medical):  Not on file  Physical Activity:   . Days of Exercise per Week: Not on file  . Minutes of Exercise per Session: Not on file  Stress:   . Feeling of Stress : Not on file  Social Connections:   . Frequency of Communication with Friends and Family: Not on file  . Frequency of Social Gatherings with Friends and Family: Not on file  . Attends Religious Services: Not on file  . Active Member of Clubs or Organizations: Not on file  . Attends Archivist Meetings: Not on file  . Marital Status: Not on file  Intimate Partner Violence:   . Fear of Current or Ex-Partner: Not on file  . Emotionally Abused: Not on file  . Physically Abused: Not on file  . Sexually Abused: Not on file     Physical Exam  Vital Signs and Nursing Notes reviewed Vitals:    03/20/19 2125 03/20/19 2130  BP: (!) 158/105   Pulse: 67 77  Resp: 12 19  Temp:  97.7 F (36.5 C)  SpO2: 95% 100%    CONSTITUTIONAL: Well-appearing, NAD NEURO:  Alert and oriented x 3, no focal deficits EYES:  eyes equal and reactive ENT/NECK:  no LAD, no JVD CARDIO: Regular rate, well-perfused, normal S1 and S2 PULM:  CTAB no wheezing or rhonchi GI/GU:  normal bowel sounds, non-distended, non-tender MSK/SPINE:  No gross deformities, no edema SKIN:  no rash, atraumatic PSYCH:  Appropriate speech and behavior  Diagnostic and Interventional Summary    EKG Interpretation  Date/Time:  03-20-2019 at 21: 25: 18 Ventricular Rate:  77 PR Interval:  165 QRS Duration: 83 QT Interval:  373 QTC Calculation: 423 R Axis:     Text Interpretation: Sinus rhythm inferior ST elevation concerning for STEMI      Labs Reviewed  CBC WITH DIFFERENTIAL/PLATELET - Abnormal; Notable for the following components:      Result Value   WBC 16.5 (*)    RBC 4.06 (*)    Hemoglobin 12.1 (*)    HCT 37.5 (*)    RDW 15.6 (*)    All other components within normal limits  RESPIRATORY PANEL BY RT PCR (FLU A&B, COVID)  PROTIME-INR  APTT  COMPREHENSIVE METABOLIC PANEL  LIPID PANEL  TROPONIN I (HIGH SENSITIVITY)    No orders to display    Medications  0.9 %  sodium chloride infusion (has no administration in time range)  nitroGLYCERIN 50 mg in dextrose 5 % 250 mL (0.2 mg/mL) infusion (10 mcg/min Intravenous New Bag/Given 03/20/19 2137)  aspirin chewable tablet 324 mg (324 mg Oral Given by Other 03/20/19 2131)  heparin injection 4,000 Units (4,000 Units Intravenous Given 03/20/19 2131)     Procedures  /  Critical Care .Critical Care Performed by: Maudie Flakes, MD Authorized by: Maudie Flakes, MD   Critical care provider statement:    Critical care time (minutes):  33   Critical care was necessary to treat or prevent imminent or life-threatening deterioration of the following conditions: ST  elevation myocardial infarction.   Critical care was time spent personally by me on the following activities:  Discussions with consultants, evaluation of patient's response to treatment, examination of patient, ordering and performing treatments and interventions, ordering and review of laboratory studies, ordering and review of radiographic studies, pulse oximetry, re-evaluation of patient's condition, obtaining history from patient or surrogate and review of old charts    ED Course and Medical Decision Making  I have reviewed the  triage vital signs and the nursing notes.  Pertinent labs & imaging results that were available during my care of the patient were reviewed by me and considered in my medical decision making (see below for details).     Code STEMI initiated prior to arrival based on patient's chest pain and new inferior elevations, question of reciprocal depression in aVL.  Hemodynamically stable, continues to have moderate pain, brought to Cath Lab for PCI.   Barth Kirks. Sedonia Small, MD [place] [email or web address]  Final Clinical Impressions(s) / ED Diagnoses     ICD-10-CM   1. ST elevation myocardial infarction (STEMI), unspecified artery ([place])  I21.3     ED Discharge Orders    None       Discharge Instructions Discussed with and Provided to Patient:   Discharge Instructions   None       Maudie Flakes, MD 03/20/19 2146

## 2019-03-21 ENCOUNTER — Inpatient Hospital Stay (HOSPITAL_COMMUNITY): Payer: Medicare Other

## 2019-03-21 ENCOUNTER — Ambulatory Visit: Payer: Medicare Other | Admitting: Orthopedic Surgery

## 2019-03-21 ENCOUNTER — Telehealth: Payer: Self-pay | Admitting: Orthopedic Surgery

## 2019-03-21 ENCOUNTER — Encounter (HOSPITAL_COMMUNITY): Payer: Self-pay | Admitting: Cardiovascular Disease

## 2019-03-21 DIAGNOSIS — I2121 ST elevation (STEMI) myocardial infarction involving left circumflex coronary artery: Secondary | ICD-10-CM

## 2019-03-21 LAB — TROPONIN I (HIGH SENSITIVITY)
Troponin I (High Sensitivity): 1145 ng/L (ref <18)
Troponin I (High Sensitivity): 4664 ng/L (ref <18)

## 2019-03-21 LAB — CBC
HCT: 34.6 % — ABNORMAL LOW (ref 39.0–52.0)
HCT: 37.9 % — ABNORMAL LOW (ref 39.0–52.0)
Hemoglobin: 11.5 g/dL — ABNORMAL LOW (ref 13.0–17.0)
Hemoglobin: 12.4 g/dL — ABNORMAL LOW (ref 13.0–17.0)
MCH: 30 pg (ref 26.0–34.0)
MCH: 30.3 pg (ref 26.0–34.0)
MCHC: 32.7 g/dL (ref 30.0–36.0)
MCHC: 33.2 g/dL (ref 30.0–36.0)
MCV: 91.1 fL (ref 80.0–100.0)
MCV: 91.5 fL (ref 80.0–100.0)
Platelets: 263 10*3/uL (ref 150–400)
Platelets: 298 10*3/uL (ref 150–400)
RBC: 3.8 MIL/uL — ABNORMAL LOW (ref 4.22–5.81)
RBC: 4.14 MIL/uL — ABNORMAL LOW (ref 4.22–5.81)
RDW: 15.3 % (ref 11.5–15.5)
RDW: 15.7 % — ABNORMAL HIGH (ref 11.5–15.5)
WBC: 12.8 10*3/uL — ABNORMAL HIGH (ref 4.0–10.5)
WBC: 15.3 10*3/uL — ABNORMAL HIGH (ref 4.0–10.5)
nRBC: 0 % (ref 0.0–0.2)
nRBC: 0.2 % (ref 0.0–0.2)

## 2019-03-21 LAB — LIPID PANEL
Cholesterol: 165 mg/dL (ref 0–200)
HDL: 48 mg/dL (ref >40)
LDL Cholesterol: 106 mg/dL — ABNORMAL HIGH (ref 0–99)
Total CHOL/HDL Ratio: 3.4 RATIO
Triglycerides: 56 mg/dL (ref <150)
VLDL: 11 mg/dL (ref 0–40)

## 2019-03-21 LAB — ECHOCARDIOGRAM COMPLETE
Height: 74 in
Weight: 3844.82 oz

## 2019-03-21 LAB — BASIC METABOLIC PANEL
Anion gap: 9 (ref 5–15)
BUN: 14 mg/dL (ref 6–20)
CO2: 22 mmol/L (ref 22–32)
Calcium: 8.5 mg/dL — ABNORMAL LOW (ref 8.9–10.3)
Chloride: 107 mmol/L (ref 98–111)
Creatinine, Ser: 1.5 mg/dL — ABNORMAL HIGH (ref 0.61–1.24)
GFR calc Af Amer: 59 mL/min — ABNORMAL LOW (ref >60)
GFR calc non Af Amer: 51 mL/min — ABNORMAL LOW (ref >60)
Glucose, Bld: 94 mg/dL (ref 70–99)
Potassium: 3.9 mmol/L (ref 3.5–5.1)
Sodium: 138 mmol/L (ref 135–145)

## 2019-03-21 LAB — URINALYSIS, ROUTINE W REFLEX MICROSCOPIC
Bilirubin Urine: NEGATIVE
Glucose, UA: NEGATIVE mg/dL
Ketones, ur: NEGATIVE mg/dL
Leukocytes,Ua: NEGATIVE
Nitrite: NEGATIVE
Protein, ur: NEGATIVE mg/dL
RBC / HPF: >50 RBC/hpf — ABNORMAL HIGH (ref 0–5)
Specific Gravity, Urine: 1.033 — ABNORMAL HIGH (ref 1.005–1.030)
pH: 6 (ref 5.0–8.0)

## 2019-03-21 LAB — RAPID URINE DRUG SCREEN, HOSP PERFORMED
Amphetamines: POSITIVE — AB
Barbiturates: NOT DETECTED
Benzodiazepines: POSITIVE — AB
Cocaine: NOT DETECTED
Opiates: POSITIVE — AB
Tetrahydrocannabinol: NOT DETECTED

## 2019-03-21 LAB — POCT I-STAT, CHEM 8
BUN: 16 mg/dL (ref 6–20)
Calcium, Ion: 1.14 mmol/L — ABNORMAL LOW (ref 1.15–1.40)
Chloride: 107 mmol/L (ref 98–111)
Creatinine, Ser: 1.3 mg/dL — ABNORMAL HIGH (ref 0.61–1.24)
Glucose, Bld: 98 mg/dL (ref 70–99)
HCT: 36 % — ABNORMAL LOW (ref 39.0–52.0)
Hemoglobin: 12.2 g/dL — ABNORMAL LOW (ref 13.0–17.0)
Potassium: 3.5 mmol/L (ref 3.5–5.1)
Sodium: 142 mmol/L (ref 135–145)
TCO2: 24 mmol/L (ref 22–32)

## 2019-03-21 LAB — HEMOGLOBIN A1C
Hgb A1c MFr Bld: 6.2 % — ABNORMAL HIGH (ref 4.8–5.6)
Mean Plasma Glucose: 131.24 mg/dL

## 2019-03-21 LAB — POCT ACTIVATED CLOTTING TIME
Activated Clotting Time: 224 seconds
Activated Clotting Time: 406 seconds

## 2019-03-21 LAB — MRSA PCR SCREENING: MRSA by PCR: NEGATIVE

## 2019-03-21 LAB — HIV ANTIBODY (ROUTINE TESTING W REFLEX): HIV Screen 4th Generation wRfx: NONREACTIVE

## 2019-03-21 LAB — TSH: TSH: 1.999 u[IU]/mL (ref 0.350–4.500)

## 2019-03-21 LAB — BRAIN NATRIURETIC PEPTIDE: B Natriuretic Peptide: 34.8 pg/mL (ref 0.0–100.0)

## 2019-03-21 MED ORDER — LISINOPRIL 5 MG PO TABS
5.0000 mg | ORAL_TABLET | Freq: Every day | ORAL | Status: DC
  Administered 2019-03-21 – 2019-03-23 (×3): 5 mg via ORAL
  Filled 2019-03-21 (×3): qty 1

## 2019-03-21 MED FILL — Heparin Sod (Porcine)-NaCl IV Soln 1000 Unit/500ML-0.9%: INTRAVENOUS | Qty: 500 | Status: AC

## 2019-03-21 NOTE — Care Management (Signed)
Patient has Medicaid; his Brilinta will be $3.90.  Midge Minium MSN, RN, NCM-BC, ACM-RN [phone]

## 2019-03-21 NOTE — Progress Notes (Addendum)
Progress Note  Patient Name: Michael [name] Date of Encounter: 03/21/2019  Primary Cardiologist: Shelva Majestic  Subjective   Mild chest pain this am.   Inpatient Medications    Scheduled Meds: . aspirin  81 mg Oral Daily  . atorvastatin  80 mg Oral q1800  . Chlorhexidine Gluconate Cloth  6 each Topical Daily  . sodium chloride flush  3 mL Intravenous Q12H  . ticagrelor  90 mg Oral BID   Continuous Infusions: . sodium chloride 10 mL/hr at 03/20/19 2303  . sodium chloride 125 mL/hr at 03/21/19 0700  . sodium chloride    . nitroGLYCERIN 20 mcg/min (03/21/19 0700)  . tirofiban 0.075 mcg/kg/min (03/21/19 0700)   PRN Meds: sodium chloride, acetaminophen, diazepam, nitroGLYCERIN, ondansetron (ZOFRAN) IV, sodium chloride flush, zolpidem   Vital Signs    Vitals:   03/21/19 0615 03/21/19 0630 03/21/19 0645 03/21/19 0811  BP: 125/84 139/90 (!) 128/96   Pulse: (!) 57 (!) 55 (!) 57   Resp: 17 13 13    Temp:    (!) 97.5 F (36.4 C)  TempSrc:    Oral  SpO2: 99% 99% 99%   Weight:      Height:        Intake/Output Summary (Last 24 hours) at 03/21/2019 0829 Last data filed at 03/21/2019 0700 Gross per 24 hour  Intake 1200.98 ml  Output 725 ml  Net 475.98 ml   Last 3 Weights 03/20/2019 01/21/2019 01/14/2019  Weight (lbs) 240 lb 4.8 oz 212 lb 212 lb  Weight (kg) 109 kg 96.163 kg 96.163 kg      Telemetry    Sinus brady, PVCs - Personally Reviewed  ECG    NSR, non-specific ST and T wave abn - Personally Reviewed  Physical Exam   GEN: No acute distress.   Neck: No JVD Cardiac: RRR, no murmurs, rubs, or gallops.  Respiratory: Clear to auscultation bilaterally. GI: Soft, nontender, non-distended  MS: No edema; No deformity. Neuro:  Nonfocal  Psych: Normal affect   Labs    High Sensitivity Troponin:   Recent Labs  Lab 03/20/19 2129 03/20/19 2344 03/21/19 0103  TROPONINIHS 138* 1,145* 4,664*      Chemistry Recent Labs  Lab 03/20/19 2129 03/20/19 2159  03/20/19 2344  NA 140 142 138  K 3.6 3.5 3.9  CL 108 107 107  CO2 23  --  22  GLUCOSE 97 98 94  BUN 16 16 14   CREATININE 1.69* 1.30* 1.50*  CALCIUM 8.8*  --  8.5*  PROT 6.0*  --   --   ALBUMIN 3.2*  --   --   AST 23  --   --   ALT 24  --   --   ALKPHOS 70  --   --   BILITOT 0.5  --   --   GFRNONAA 44*  --  51*  GFRAA 51*  --  59*  ANIONGAP 9  --  9     Hematology Recent Labs  Lab 03/20/19 2129 03/20/19 2159 03/20/19 2344  WBC 16.5*  --  15.3*  RBC 4.06*  --  4.14*  HGB 12.1* 12.2* 12.4*  HCT 37.5* 36.0* 37.9*  MCV 92.4  --  91.5  MCH 29.8  --  30.0  MCHC 32.3  --  32.7  RDW 15.6*  --  15.3  PLT 288  --  298    BNP Recent Labs  Lab 03/20/19 2344  BNP 34.8     DDimer No  results for input(s): DDIMER in the last 168 hours.   Radiology    CARDIAC CATHETERIZATION  Result Date: 03/20/2019  Dist LAD lesion is 100% stenosed.  1st Mrg lesion is 100% stenosed.  The left ventricular systolic function is normal.  The left ventricular ejection fraction is 50-55% by visual estimate.  Post intervention, there is a 0% residual stenosis.  Probable embolic occlusion of the distal OM branch of the circumflex as well as distal LAD. Normal RCA with superior takeoff. Low normal global LV function with EF estimated 50 to 55% and focal area of mid anterolateral and focal apical hypocontractility. Low level PTCA of the distal circumflex with improvement of flow from TIMI 0 to TIMI I in a small caliber distal vessel. Spontaneous reperfusion of the LAD which ultimately wraps around the LV apex. RECOMMENDATION: Aggrastat was started due to high suspect for embolic etiology rather than atherosclerotic plaque; continue for 18 hours post procedure.  DAPT with aspirin/Brilinta.  Smoking cessation is imperative.  We will plan for echo Doppler study in a.m.    Cardiac Studies     Patient Profile     58 y.o. male with history of pulmonary sardoidosis on chronic prednisone, CKD who was  admitted 03/20/19 with an inferior STEMI.   Assessment & Plan    1. CAD/Inferolateral STEMI: Pt found to have occlusion of the distal Circumflex and distal LAD. The distal Circumflex was treated with balloon angioplasty. The LAD lesion resolved during the case. He is on Aggrastat for 18 hours post cath, ASA, Brilinta and statin. I reviewed with Dr. Claiborne Billings. Plan to continue ASA and Brilinta and not start long term anti-coagulation.  If he had recurrent embolic events, would need to look for embolic sources. Echo pending today. Will leave in ICU today. Add Lisinopril  2. Tobacco abuse: Smoking cessation counseling provided.   For questions or updates, please contact [place] Please consult [email or web address] for contact info under        Signed, Lauree Chandler, MD  03/21/2019, 8:29 AM

## 2019-03-21 NOTE — Progress Notes (Signed)
1410-1500 MI education begun with pt who voiced understanding. Reviewed MI restrictions, NTG use, brilinta, heart healthy food choices(gave diet), smoking cessation (gave handout) and CRP 2. Discussed with pt that his sugars were up. A1C at 6.2.  Pt stated he drinks 12 regular Mtn Dews a day. Discussed cutting back on sodas and starchy foods and eating lots of green vegetables and lean meats. Pt stated he quit smoking once for about 6 months cold [place] and plans to quit now. Referred to [place] CRP 2. Pt is interested in participating in Virtual Cardiac and Pulmonary Rehab. Pt advised that Virtual Cardiac and Pulmonary Rehab is provided at no cost to the patient.  Checklist:  1. Pt has smart device  ie smartphone and/or ipad for downloading an app  Yes 2. Reliable internet/wifi service    Yes 3. Understands how to use their smartphone and navigate within an app.  Yes   Pt verbalized understanding and is in agreement.

## 2019-03-21 NOTE — Progress Notes (Signed)
Echocardiogram 2D Echocardiogram has been performed.  Oneal Deputy Mykel Mohl 03/21/2019, 10:24 AM

## 2019-03-21 NOTE — Telephone Encounter (Signed)
Patient did not keep his appointment today because he is in the hospital after suffering a heart attack last night.  He will call back to reschedule this appointment

## 2019-03-21 NOTE — Progress Notes (Deleted)
Pharmacy note: tirofiban  37 you male s/p cath with angioplasty on tirofiban for 18 hours -SCr= 1.5, CrCl ~ 70 -hg= 12.4, plt= 298  Plan -Adjust tirofiban to 0.15 mcg/kg/min due to CrCl > 60  Hildred Laser, PharmD Clinical Pharmacist **Pharmacist phone directory can now be found on [email or web address] ([email or web address]).  Listed under [place].

## 2019-03-22 ENCOUNTER — Other Ambulatory Visit: Payer: Self-pay

## 2019-03-22 DIAGNOSIS — I2119 ST elevation (STEMI) myocardial infarction involving other coronary artery of inferior wall: Secondary | ICD-10-CM

## 2019-03-22 LAB — BASIC METABOLIC PANEL
Anion gap: 9 (ref 5–15)
BUN: 15 mg/dL (ref 6–20)
CO2: 23 mmol/L (ref 22–32)
Calcium: 8.8 mg/dL — ABNORMAL LOW (ref 8.9–10.3)
Chloride: 108 mmol/L (ref 98–111)
Creatinine, Ser: 1.43 mg/dL — ABNORMAL HIGH (ref 0.61–1.24)
GFR calc Af Amer: >60 mL/min (ref >60)
GFR calc non Af Amer: 54 mL/min — ABNORMAL LOW (ref >60)
Glucose, Bld: 98 mg/dL (ref 70–99)
Potassium: 4.3 mmol/L (ref 3.5–5.1)
Sodium: 140 mmol/L (ref 135–145)

## 2019-03-22 MED ORDER — METOPROLOL SUCCINATE ER 25 MG PO TB24
12.5000 mg | ORAL_TABLET | Freq: Every day | ORAL | Status: DC
  Administered 2019-03-22 – 2019-03-23 (×2): 12.5 mg via ORAL
  Filled 2019-03-22 (×2): qty 1

## 2019-03-22 NOTE — Progress Notes (Signed)
Progress Note  Patient Name: Michael [name] Date of Encounter: 03/22/2019  Primary Cardiologist: No primary care provider on file.   Subjective   On presentation with acute onset chest pain.  Found to have probable coronary emboli in 2 different territories.  Currently denies shortness of breath.  Has pulmonary sarcoidosis.  Inpatient Medications    Scheduled Meds: . aspirin  81 mg Oral Daily  . atorvastatin  80 mg Oral q1800  . Chlorhexidine Gluconate Cloth  6 each Topical Daily  . lisinopril  5 mg Oral Daily  . sodium chloride flush  3 mL Intravenous Q12H  . ticagrelor  90 mg Oral BID   Continuous Infusions: . sodium chloride Stopped (03/21/19 1900)  . sodium chloride Stopped (03/21/19 1040)  . sodium chloride    . nitroGLYCERIN Stopped (03/21/19 1639)   PRN Meds: sodium chloride, acetaminophen, diazepam, nitroGLYCERIN, ondansetron (ZOFRAN) IV, sodium chloride flush, zolpidem   Vital Signs    Vitals:   03/22/19 0600 03/22/19 0700 03/22/19 0808 03/22/19 1000  BP:    116/87  Pulse: 71 73  63  Resp: 16 11  11   Temp:   97.7 F (36.5 C)   TempSrc:   Oral   SpO2: 99% 99%  100%  Weight:      Height:        Intake/Output Summary (Last 24 hours) at 03/22/2019 1053 Last data filed at 03/22/2019 0800 Gross per 24 hour  Intake 320.35 ml  Output 2200 ml  Net -1879.65 ml   Last 3 Weights 03/20/2019 01/21/2019 01/14/2019  Weight (lbs) 240 lb 4.8 oz 212 lb 212 lb  Weight (kg) 109 kg 96.163 kg 96.163 kg      Telemetry    Normal sinus rhythm- Personally Reviewed  ECG    Performed this morning demonstrates inferolateral Q wave infarction with inferior and lateral T wave inversion.  Otherwise unremarkable.  When compared to the admitting EKG, ST segment elevation has resolved.- Personally Reviewed  Physical Exam  Bland affect GEN: No acute distress.   Neck: No JVD Cardiac: RRR, no murmurs, rubs, or gallops.  Respiratory: Clear to auscultation bilaterally. GI:  Soft, nontender, non-distended  MS: No edema; No deformity. Neuro:  Nonfocal  Psych: Bland affect affect   Labs    High Sensitivity Troponin:   Recent Labs  Lab 03/20/19 2129 03/20/19 2344 03/21/19 0103  TROPONINIHS 138* 1,145* 4,664*      Chemistry Recent Labs  Lab 03/20/19 2129 03/20/19 2159 03/20/19 2344 03/22/19 0403  NA 140 142 138 140  K 3.6 3.5 3.9 4.3  CL 108 107 107 108  CO2 23  --  22 23  GLUCOSE 97 98 94 98  BUN 16 16 14 15   CREATININE 1.69* 1.30* 1.50* 1.43*  CALCIUM 8.8*  --  8.5* 8.8*  PROT 6.0*  --   --   --   ALBUMIN 3.2*  --   --   --   AST 23  --   --   --   ALT 24  --   --   --   ALKPHOS 70  --   --   --   BILITOT 0.5  --   --   --   GFRNONAA 44*  --  51* 54*  GFRAA 51*  --  59* >60  ANIONGAP 9  --  9 9     Hematology Recent Labs  Lab 03/20/19 2129 03/20/19 2159 03/20/19 2344 03/21/19 2346  WBC 16.5*  --  15.3* 12.8*  RBC 4.06*  --  4.14* 3.80*  HGB 12.1* 12.2* 12.4* 11.5*  HCT 37.5* 36.0* 37.9* 34.6*  MCV 92.4  --  91.5 91.1  MCH 29.8  --  30.0 30.3  MCHC 32.3  --  32.7 33.2  RDW 15.6*  --  15.3 15.7*  PLT 288  --  298 263    BNP Recent Labs  Lab 03/20/19 2344  BNP 34.8     DDimer No results for input(s): DDIMER in the last 168 hours.   Radiology    CARDIAC CATHETERIZATION  Result Date: 03/20/2019  Dist LAD lesion is 100% stenosed.  1st Mrg lesion is 100% stenosed.  The left ventricular systolic function is normal.  The left ventricular ejection fraction is 50-55% by visual estimate.  Post intervention, there is a 0% residual stenosis.  Probable embolic occlusion of the distal OM branch of the circumflex as well as distal LAD. Normal RCA with superior takeoff. Low normal global LV function with EF estimated 50 to 55% and focal area of mid anterolateral and focal apical hypocontractility. Low level PTCA of the distal circumflex with improvement of flow from TIMI 0 to TIMI I in a small caliber distal vessel. Spontaneous  reperfusion of the LAD which ultimately wraps around the LV apex. RECOMMENDATION: Aggrastat was started due to high suspect for embolic etiology rather than atherosclerotic plaque; continue for 18 hours post procedure.  DAPT with aspirin/Brilinta.  Smoking cessation is imperative.  We will plan for echo Doppler study in a.m.   ECHOCARDIOGRAM COMPLETE  Result Date: 03/21/2019   ECHOCARDIOGRAM REPORT   Patient Name:   Michael [name] Date of Exam: 03/21/2019 Medical Rec #:  [id number]    Height:       74.0 in Accession #:    [id number]   Weight:       240.3 lb Date of Birth:  [birth year]   BSA:          2.35 m Patient Age:    9 years     BP:           138/88 mmHg Patient Gender: M            HR:           65 bpm. Exam Location:  Inpatient Procedure: 2D Echo, Color Doppler and Cardiac Doppler Indications:    Acute MI (410)  History:        Patient has no prior history of Echocardiogram examinations.                 STEMI; Risk Factors:Sleep Apnea. Sarcoidosis.  Sonographer:    Raquel Sarna Senior RDCS Referring Phys: [place]  1. Left ventricular ejection fraction, by visual estimation, is 60 to 65%. The left ventricle has normal function. There is mildly increased left ventricular hypertrophy.  2. The left ventricle has no regional wall motion abnormalities.  3. Global right ventricle has normal systolic function.The right ventricular size is normal. No increase in right ventricular wall thickness.  4. Left atrial size was normal.  5. Right atrial size was normal.  6. The mitral valve is normal in structure. No evidence of mitral valve regurgitation.  7. The tricuspid valve is normal in structure. Tricuspid valve regurgitation is not demonstrated.  8. The aortic valve is grossly normal. Aortic valve regurgitation is not visualized. No evidence of aortic valve sclerosis or stenosis.  9. The pulmonic valve was normal in structure. Pulmonic valve regurgitation  is not visualized. 10. Technically  difficult echo with poor image quality. 11. The atrial septum is grossly normal. FINDINGS  Left Ventricle: Left ventricular ejection fraction, by visual estimation, is 60 to 65%. The left ventricle has normal function. The left ventricle has no regional wall motion abnormalities. There is mildly increased left ventricular hypertrophy. Right Ventricle: The right ventricular size is normal. No increase in right ventricular wall thickness. Global RV systolic function is has normal systolic function. Left Atrium: Left atrial size was normal in size. Right Atrium: Right atrial size was normal in size Pericardium: There is no evidence of pericardial effusion. Mitral Valve: The mitral valve is normal in structure. No evidence of mitral valve regurgitation. Tricuspid Valve: The tricuspid valve is normal in structure. Tricuspid valve regurgitation is not demonstrated. Aortic Valve: The aortic valve is grossly normal. Aortic valve regurgitation is not visualized. The aortic valve is structurally normal, with no evidence of sclerosis or stenosis. Pulmonic Valve: The pulmonic valve was normal in structure. Pulmonic valve regurgitation is not visualized. Pulmonic regurgitation is not visualized. Aorta: The aortic root and ascending aorta are structurally normal, with no evidence of dilitation. IAS/Shunts: The atrial septum is grossly normal. Additional Comments: Technically difficult echo with poor image quality.  LEFT VENTRICLE PLAX 2D LVIDd:         4.65 cm       Diastology LVIDs:         3.50 cm       LV e' lateral:   6.53 cm/s LV PW:         0.96 cm       LV E/e' lateral: 9.1 LV IVS:        0.96 cm       LV e' medial:    7.18 cm/s LVOT diam:     2.00 cm       LV E/e' medial:  8.2 LV SV:         49 ml LV SV Index:   20.31 LVOT Area:     3.14 cm  LV Volumes (MOD) LV area d, A2C:    35.10 cm LV area d, A4C:    36.80 cm LV area s, A2C:    23.30 cm LV area s, A4C:    22.50 cm LV major d, A2C:   9.20 cm LV major d, A4C:    9.21 cm LV major s, A2C:   7.74 cm LV major s, A4C:   7.85 cm LV vol d, MOD A2C: 112.0 ml LV vol d, MOD A4C: 125.0 ml LV vol s, MOD A2C: 58.8 ml LV vol s, MOD A4C: 54.5 ml LV SV MOD A2C:     53.2 ml LV SV MOD A4C:     125.0 ml LV SV MOD BP:      61.3 ml RIGHT VENTRICLE RV S prime:     9.03 cm/s TAPSE (M-mode): 1.8 cm LEFT ATRIUM             Index       RIGHT ATRIUM           Index LA diam:        2.90 cm 1.23 cm/m  RA Area:     16.20 cm LA Vol (A2C):   50.9 ml 21.66 ml/m RA Volume:   42.50 ml  18.09 ml/m LA Vol (A4C):   27.0 ml 11.49 ml/m LA Biplane Vol: 37.4 ml 15.92 ml/m  AORTIC VALVE LVOT Vmax:   93.80 cm/s LVOT  Vmean:  64.700 cm/s LVOT VTI:    0.193 m  AORTA Ao Root diam: 2.60 cm Ao Asc diam:  3.00 cm MITRAL VALVE MV Area (PHT): 2.80 cm             SHUNTS MV PHT:        78.59 msec           Systemic VTI:  0.19 m MV Decel Time: 271 msec             Systemic Diam: 2.00 cm MV E velocity: 59.20 cm/s 103 cm/s MV A velocity: 43.70 cm/s 70.3 cm/s MV E/A ratio:  1.35       1.5  Mertie Moores MD Electronically signed by Mertie Moores MD Signature Date/Time: 03/21/2019/12:17:53 PM    Final     Cardiac Studies   See echo report above.  No significant abnormalities found.  Patient Profile     58 y.o. male with history of pulmonary sardoidosis on chronic prednisone, CKD who was admitted 03/20/19 with an inferior STEMI.   Assessment & Plan    1. ST elevation myocardial infarction, involving the inferior lateral wall: Embolic occlusion of the apical LAD and an inferolateral marginal branch.  Etiology is uncertain.  No obvious source of emboli noted on echocardiography.  Possible explanations could be atrial fibrillation with embolic event, paradoxical embolism, and thrombophilia.  May need to have a hypercoagulable work-up performed.  Plan preventive therapy including dual antiplatelet agent, beta-blocker and ARB if tolerated by blood pressure, graded increase in physical activity, further follow-up as  needed. 2. History of pulmonary sarcoidosis: Unable to find substantiating data 3. Depression 4. Chronic kidney disease, stage II  For questions or updates, please contact [place] Please consult [email or web address] for contact info under        Signed, Sinclair Grooms, MD  03/22/2019, 10:53 AM

## 2019-03-22 NOTE — Plan of Care (Signed)
Overnight Progress Note:  Pt has been off all gtt since yesterday afternoon. VSS, R radial site level 0. Pt denies any CP or pressure. Voids fine. Seems pt is a good candidate for transfer/discharge.   Discussed importance of Brilinta and smoking cessation to prevent future heart attacks. Pt laughed. Will continue to reinforce importance of these things.  Of note, UDS noted benzos, opioids, and amphetamine. Pt received versed and fentanyl in Cath Lab, but has not received any amphetamines this admission and does not have them listed in his PTA med list.   Problem: Clinical Measurements: Goal: Ability to maintain clinical measurements within normal limits will improve Outcome: Adequate for Discharge Goal: Diagnostic test results will improve Outcome: Adequate for Discharge Goal: Cardiovascular complication will be avoided Outcome: Adequate for Discharge   Problem: Activity: Goal: Risk for activity intolerance will decrease Outcome: Adequate for Discharge   Problem: Coping: Goal: Level of anxiety will decrease Outcome: Adequate for Discharge   Problem: Elimination: Goal: Will not experience complications related to bowel motility Outcome: Adequate for Discharge Goal: Will not experience complications related to urinary retention Outcome: Adequate for Discharge   Problem: Pain Managment: Goal: General experience of comfort will improve Outcome: Adequate for Discharge   Problem: Safety: Goal: Ability to remain free from injury will improve Outcome: Adequate for Discharge   Problem: Skin Integrity: Goal: Risk for impaired skin integrity will decrease Outcome: Adequate for Discharge   Problem: Activity: Goal: Ability to return to baseline activity level will improve Outcome: Adequate for Discharge   Problem: Cardiovascular: Goal: Ability to achieve and maintain adequate cardiovascular perfusion will improve Outcome: Adequate for Discharge Goal: Vascular access site(s) Level  0-1 will be maintained Outcome: Adequate for Discharge

## 2019-03-22 NOTE — Progress Notes (Signed)
CARDIAC REHAB PHASE I   PRE:  Rate/Rhythm: SR 70   BP:  Supine: 117/75     SaO2: 100% RA  MODE:  Ambulation: 370 ft   POST:  Rate/Rhythm: SR 85  BP:  Sitting: 123/83     SaO2: 100% RA  1140-1210 Pt ambulated 360ft with steady gait.  No complaints.  Pt assisted to chair after walk with call bell in reach.  Lunch given to pt. Exercise guidelines reviewed and handout given to pt.  Verbalized understanding.  Pt requesting to "wash up."  Bedside RN informed of this and pt's walk.    Noel Christmas, RN 03/22/2019 12:10 PM

## 2019-03-23 ENCOUNTER — Encounter (HOSPITAL_COMMUNITY): Payer: Self-pay | Admitting: Cardiovascular Disease

## 2019-03-23 DIAGNOSIS — E785 Hyperlipidemia, unspecified: Secondary | ICD-10-CM

## 2019-03-23 DIAGNOSIS — Z9861 Coronary angioplasty status: Secondary | ICD-10-CM

## 2019-03-23 HISTORY — DX: Hyperlipidemia, unspecified: E78.5

## 2019-03-23 MED ORDER — PREDNISONE 20 MG PO TABS: 20.0000 mg | ORAL_TABLET | Freq: Every day | ORAL | Status: DC

## 2019-03-23 MED ORDER — METOPROLOL SUCCINATE ER 25 MG PO TB24: 12.5000 mg | ORAL_TABLET | Freq: Every day | ORAL | 6 refills | Status: DC

## 2019-03-23 MED ORDER — LISINOPRIL 5 MG PO TABS: 5.0000 mg | ORAL_TABLET | Freq: Every day | ORAL | 6 refills | Status: DC

## 2019-03-23 MED ORDER — ASPIRIN 81 MG PO CHEW: 81.0000 mg | CHEWABLE_TABLET | Freq: Every day | ORAL | Status: DC

## 2019-03-23 MED ORDER — PREDNISONE 20 MG PO TABS: 20.0000 mg | ORAL_TABLET | Freq: Every day | ORAL | 1 refills | Status: DC

## 2019-03-23 MED ORDER — NITROGLYCERIN 0.4 MG SL SUBL: 0.4000 mg | SUBLINGUAL_TABLET | SUBLINGUAL | 4 refills | Status: DC | PRN

## 2019-03-23 MED ORDER — ACETAMINOPHEN 325 MG PO TABS: 650.0000 mg | ORAL_TABLET | ORAL | Status: DC | PRN

## 2019-03-23 MED ORDER — TICAGRELOR 90 MG PO TABS: 90.0000 mg | ORAL_TABLET | Freq: Two times a day (BID) | ORAL | 11 refills | Status: DC

## 2019-03-23 MED ORDER — ATORVASTATIN CALCIUM 80 MG PO TABS: 80.0000 mg | ORAL_TABLET | Freq: Every day | ORAL | 6 refills | Status: DC

## 2019-03-23 NOTE — Progress Notes (Signed)
Progress Note  Patient Name: Michael [name] Date of Encounter: 03/23/2019  Primary Cardiologist: Dr Claiborne Billings  Subjective   No complaints  Inpatient Medications    Scheduled Meds: . aspirin  81 mg Oral Daily  . atorvastatin  80 mg Oral q1800  . Chlorhexidine Gluconate Cloth  6 each Topical Daily  . lisinopril  5 mg Oral Daily  . metoprolol succinate  12.5 mg Oral Daily  . sodium chloride flush  3 mL Intravenous Q12H  . ticagrelor  90 mg Oral BID   Continuous Infusions: . sodium chloride Stopped (03/21/19 1900)  . sodium chloride Stopped (03/21/19 1040)  . sodium chloride    . nitroGLYCERIN Stopped (03/21/19 1639)   PRN Meds: sodium chloride, acetaminophen, diazepam, nitroGLYCERIN, ondansetron (ZOFRAN) IV, sodium chloride flush, zolpidem   Vital Signs    Vitals:   03/22/19 2023 03/22/19 2351 03/23/19 0512 03/23/19 0751  BP: (!) 113/100 125/76 96/70 119/81  Pulse: 76 66 71 71  Resp: 16 13 18    Temp: 99.9 F (37.7 C) 98.2 F (36.8 C) 98.4 F (36.9 C) 98.8 F (37.1 C)  TempSrc: Oral Oral Oral Oral  SpO2: 98% 98% 100% 99%  Weight:   96.9 kg   Height:        Intake/Output Summary (Last 24 hours) at 03/23/2019 0828 Last data filed at 03/23/2019 0700 Gross per 24 hour  Intake 540 ml  Output 1900 ml  Net -1360 ml   Last 3 Weights 03/23/2019 03/20/2019 01/21/2019  Weight (lbs) 213 lb 9.6 oz 240 lb 4.8 oz 212 lb  Weight (kg) 96.888 kg 109 kg 96.163 kg      Telemetry    SR - Personally Reviewed  ECG    n/a - Personally Reviewed  Physical Exam   GEN: No acute distress.   Neck: No JVD Cardiac: RRR, no murmurs, rubs, or gallops.  Respiratory: Clear to auscultation bilaterally. GI: Soft, nontender, non-distended  MS: No edema; No deformity. Neuro:  Nonfocal  Psych: Normal affect   Labs    High Sensitivity Troponin:   Recent Labs  Lab 03/20/19 2129 03/20/19 2344 03/21/19 0103  TROPONINIHS 138* 1,145* 4,664*      Chemistry Recent Labs  Lab  03/20/19 2129 03/20/19 2159 03/20/19 2344 03/22/19 0403  NA 140 142 138 140  K 3.6 3.5 3.9 4.3  CL 108 107 107 108  CO2 23  --  22 23  GLUCOSE 97 98 94 98  BUN 16 16 14 15   CREATININE 1.69* 1.30* 1.50* 1.43*  CALCIUM 8.8*  --  8.5* 8.8*  PROT 6.0*  --   --   --   ALBUMIN 3.2*  --   --   --   AST 23  --   --   --   ALT 24  --   --   --   ALKPHOS 70  --   --   --   BILITOT 0.5  --   --   --   GFRNONAA 44*  --  51* 54*  GFRAA 51*  --  59* >60  ANIONGAP 9  --  9 9     Hematology Recent Labs  Lab 03/20/19 2129 03/20/19 2159 03/20/19 2344 03/21/19 2346  WBC 16.5*  --  15.3* 12.8*  RBC 4.06*  --  4.14* 3.80*  HGB 12.1* 12.2* 12.4* 11.5*  HCT 37.5* 36.0* 37.9* 34.6*  MCV 92.4  --  91.5 91.1  MCH 29.8  --  30.0 30.3  MCHC 32.3  --  32.7 33.2  RDW 15.6*  --  15.3 15.7*  PLT 288  --  298 263    BNP Recent Labs  Lab 03/20/19 2344  BNP 34.8     DDimer No results for input(s): DDIMER in the last 168 hours.   Radiology    ECHOCARDIOGRAM COMPLETE  Result Date: 03/21/2019   ECHOCARDIOGRAM REPORT   Patient Name:   Michael [name] Date of Exam: 03/21/2019 Medical Rec #:  [id number]    Height:       74.0 in Accession #:    [id number]   Weight:       240.3 lb Date of Birth:  09/29/1960   BSA:          2.35 m Patient Age:    58 years     BP:           138/88 mmHg Patient Gender: M            HR:           65 bpm. Exam Location:  Inpatient Procedure: 2D Echo, Color Doppler and Cardiac Doppler Indications:    Acute MI (410)  History:        Patient has no prior history of Echocardiogram examinations.                 STEMI; Risk Factors:Sleep Apnea. Sarcoidosis.  Sonographer:    Raquel Sarna Senior RDCS Referring Phys: Arthur  1. Left ventricular ejection fraction, by visual estimation, is 60 to 65%. The left ventricle has normal function. There is mildly increased left ventricular hypertrophy.  2. The left ventricle has no regional wall motion abnormalities.  3. Global  right ventricle has normal systolic function.The right ventricular size is normal. No increase in right ventricular wall thickness.  4. Left atrial size was normal.  5. Right atrial size was normal.  6. The mitral valve is normal in structure. No evidence of mitral valve regurgitation.  7. The tricuspid valve is normal in structure. Tricuspid valve regurgitation is not demonstrated.  8. The aortic valve is grossly normal. Aortic valve regurgitation is not visualized. No evidence of aortic valve sclerosis or stenosis.  9. The pulmonic valve was normal in structure. Pulmonic valve regurgitation is not visualized. 10. Technically difficult echo with poor image quality. 11. The atrial septum is grossly normal. FINDINGS  Left Ventricle: Left ventricular ejection fraction, by visual estimation, is 60 to 65%. The left ventricle has normal function. The left ventricle has no regional wall motion abnormalities. There is mildly increased left ventricular hypertrophy. Right Ventricle: The right ventricular size is normal. No increase in right ventricular wall thickness. Global RV systolic function is has normal systolic function. Left Atrium: Left atrial size was normal in size. Right Atrium: Right atrial size was normal in size Pericardium: There is no evidence of pericardial effusion. Mitral Valve: The mitral valve is normal in structure. No evidence of mitral valve regurgitation. Tricuspid Valve: The tricuspid valve is normal in structure. Tricuspid valve regurgitation is not demonstrated. Aortic Valve: The aortic valve is grossly normal. Aortic valve regurgitation is not visualized. The aortic valve is structurally normal, with no evidence of sclerosis or stenosis. Pulmonic Valve: The pulmonic valve was normal in structure. Pulmonic valve regurgitation is not visualized. Pulmonic regurgitation is not visualized. Aorta: The aortic root and ascending aorta are structurally normal, with no evidence of dilitation. IAS/Shunts:  The atrial septum is grossly normal. Additional Comments:  Technically difficult echo with poor image quality.  LEFT VENTRICLE PLAX 2D LVIDd:         4.65 cm       Diastology LVIDs:         3.50 cm       LV e' lateral:   6.53 cm/s LV PW:         0.96 cm       LV E/e' lateral: 9.1 LV IVS:        0.96 cm       LV e' medial:    7.18 cm/s LVOT diam:     2.00 cm       LV E/e' medial:  8.2 LV SV:         49 ml LV SV Index:   20.31 LVOT Area:     3.14 cm  LV Volumes (MOD) LV area d, A2C:    35.10 cm LV area d, A4C:    36.80 cm LV area s, A2C:    23.30 cm LV area s, A4C:    22.50 cm LV major d, A2C:   9.20 cm LV major d, A4C:   9.21 cm LV major s, A2C:   7.74 cm LV major s, A4C:   7.85 cm LV vol d, MOD A2C: 112.0 ml LV vol d, MOD A4C: 125.0 ml LV vol s, MOD A2C: 58.8 ml LV vol s, MOD A4C: 54.5 ml LV SV MOD A2C:     53.2 ml LV SV MOD A4C:     125.0 ml LV SV MOD BP:      61.3 ml RIGHT VENTRICLE RV S prime:     9.03 cm/s TAPSE (M-mode): 1.8 cm LEFT ATRIUM             Index       RIGHT ATRIUM           Index LA diam:        2.90 cm 1.23 cm/m  RA Area:     16.20 cm LA Vol (A2C):   50.9 ml 21.66 ml/m RA Volume:   42.50 ml  18.09 ml/m LA Vol (A4C):   27.0 ml 11.49 ml/m LA Biplane Vol: 37.4 ml 15.92 ml/m  AORTIC VALVE LVOT Vmax:   93.80 cm/s LVOT Vmean:  64.700 cm/s LVOT VTI:    0.193 m  AORTA Ao Root diam: 2.60 cm Ao Asc diam:  3.00 cm MITRAL VALVE MV Area (PHT): 2.80 cm             SHUNTS MV PHT:        78.59 msec           Systemic VTI:  0.19 m MV Decel Time: 271 msec             Systemic Diam: 2.00 cm MV E velocity: 59.20 cm/s 103 cm/s MV A velocity: 43.70 cm/s 70.3 cm/s MV E/A ratio:  1.35       1.5  Mertie Moores MD Electronically signed by Mertie Moores MD Signature Date/Time: 03/21/2019/12:17:53 PM    Final     Cardiac Studies     Patient Profile     58 y.o. male with history of pulmonary sardoidosis on chronic prednisone, CKD who was admitted 03/20/19 with an inferior STEMI.  Assessment & Plan      1. STEMI - admitted with inferior STEMI - catho showed occluded distal LAD and OM1, probable emoblic occlusion - [id number] echo LVEF 60-65%, normal RV - unclear source  of emboli. No significaint finding by echo. No afib has been noted this admission  - medical therapy with ASA 81, atorva 80 mg, lisinopril 5 mg, toprol 12.5 mg, brillinta 90mg  bid - doing well this morning without symptoms,ambulated yesterday with cardiac rehab without issuess  Plan for discharge today.   For questions or updates, please contact [facility] Please consult [email or web address] for contact info under        Signed, Carlyle Dolly, MD  03/23/2019, 8:28 AM

## 2019-03-23 NOTE — Discharge Instructions (Signed)
Call [facility] at [phone] if any bleeding, swelling or drainage at cath site.  May shower, no tub baths for 48 hours for groin sticks. No lifting over 5 pounds for 5 days.  No Driving for 5 days  Take 1 NTG, under your tongue, while sitting.  If no relief of pain may repeat NTG, one tab every 5 minutes up to 3 tablets total over 15 minutes.  If no relief CALL 911.  If you have dizziness/lightheadness  while taking NTG, stop taking and call 911.        Heart healthy diet, you should decrease your sugar - your levels are running high.  Decrease white bread better to use whole wheat .  Check your bottle to make sure prednisone is same if NOT do not take new prescription.    You need to take asprin daily and brilianta  twice a day- this is to help prevent clots that caused your heart attack.  Very important not to stop.

## 2019-03-23 NOTE — Discharge Summary (Signed)
Discharge Summary    Patient ID: Michael [name] MRN: [id number]; DOB: 05/27/1960  Admit date: 03/20/2019 Discharge date: 03/23/2019  Primary Care Provider: Neale Burly, MD  Primary Cardiologist: Carlyle Dolly, MD  Primary Electrophysiologist:  None   Discharge Diagnoses    Principal Problem:   STEMI involving left circumflex coronary artery [facility]) Active Problems:   S/P PTCA (percutaneous transluminal coronary angioplasty) 03/20/19 to LCX    Sarcoidosis   TOBACCO ABUSE   Chronic kidney disease, stage 2 (mild)   STEMI (ST elevation myocardial infarction) ([place])   HLD (hyperlipidemia)    Diagnostic Studies/Procedures    Emergent Cath and PCI 03/20/19  Dist LAD lesion is 100% stenosed.  1st Mrg lesion is 100% stenosed.  The left ventricular systolic function is normal.  The left ventricular ejection fraction is 50-55% by visual estimate.  Post intervention, there is a 0% residual stenosis.   Probable embolic occlusion of the distal OM branch of the circumflex as well as distal LAD.  Normal RCA with superior takeoff.  Low normal global LV function with EF estimated 50 to 55% and focal area of mid anterolateral and focal apical hypocontractility.  Low level PTCA of the distal circumflex with improvement of flow from TIMI 0 to TIMI I in a small caliber distal vessel.  Spontaneous reperfusion of the LAD which ultimately wraps around the LV apex.  RECOMMENDATION: Aggrastat was started due to high suspect for embolic etiology rather than atherosclerotic plaque; continue for 18 hours post procedure.  DAPT with aspirin/Brilinta.  Smoking cessation is imperative.  We will plan for echo Doppler study in a.m.   Diagnostic Dominance: Right  Intervention    _______  Echo 03/21/19 _ IMPRESSIONS    1. Left ventricular ejection fraction, by visual estimation, is 60 to 65%. The left ventricle has normal function. There is mildly increased left ventricular  hypertrophy.  2. The left ventricle has no regional wall motion abnormalities.  3. Global right ventricle has normal systolic function.The right ventricular size is normal. No increase in right ventricular wall thickness.  4. Left atrial size was normal.  5. Right atrial size was normal.  6. The mitral valve is normal in structure. No evidence of mitral valve regurgitation.  7. The tricuspid valve is normal in structure. Tricuspid valve regurgitation is not demonstrated.  8. The aortic valve is grossly normal. Aortic valve regurgitation is not visualized. No evidence of aortic valve sclerosis or stenosis.  9. The pulmonic valve was normal in structure. Pulmonic valve regurgitation is not visualized. 10. Technically difficult echo with poor image quality. 11. The atrial septum is grossly normal.  FINDINGS  Left Ventricle: Left ventricular ejection fraction, by visual estimation, is 60 to 65%. The left ventricle has normal function. The left ventricle has no regional wall motion abnormalities. There is mildly increased left ventricular hypertrophy.  Right Ventricle: The right ventricular size is normal. No increase in right ventricular wall thickness. Global RV systolic function is has normal systolic function.  Left Atrium: Left atrial size was normal in size.  Right Atrium: Right atrial size was normal in size  Pericardium: There is no evidence of pericardial effusion.  Mitral Valve: The mitral valve is normal in structure. No evidence of mitral valve regurgitation.  Tricuspid Valve: The tricuspid valve is normal in structure. Tricuspid valve regurgitation is not demonstrated.  Aortic Valve: The aortic valve is grossly normal. Aortic valve regurgitation is not visualized. The aortic valve is structurally normal, with no evidence of  sclerosis or stenosis.  Pulmonic Valve: The pulmonic valve was normal in structure. Pulmonic valve regurgitation is not visualized. Pulmonic  regurgitation is not visualized.  Aorta: The aortic root and ascending aorta are structurally normal, with no evidence of dilitation.  IAS/Shunts: The atrial septum is grossly normal.  Additional Comments: Technically difficult echo with poor image quality.   _____   History of Present Illness     Michael [name] is a 58 y.o. male with tobacco use and pulmonary sarcoidosis, on chronic prednisone,  Presented to ER 03/20/19 with chest pain that began around 6 pm, a burning sensation with radiation to both arms and back.  EMS was called and EKG with ST elevation in inf and lateral leads.   His BP in ER 1802/100s with continued chest pain and ASA 325 given.    Covid and Flu tests neg.  Hs troponin 138 Cr 1.69 K+ 3.6 Hgb 12.1, WBC 16.5, plts 288.  He was taken emergently to cath lab for STEMI.    Hospital Course     Consultants: none   Pt was found to have 100% LAD distal and 1st marginal 100% stenosis  EF 50-55%, though to be embolic occlusion of distal OM of LCX and distal LAD.    Did the patient have an acute coronary syndrome (MI, NSTEMI, STEMI, etc) thi PCI with low level PTCA of distal LCX with improved flow in small caliber distal bessel. and spontaneous reperfusion of the LAD.  Pt placed on aggrastat due to high suspect for embolic etiology rather than atherosclerotic plaque.  Pts pk troponin 4,664  HgbA1C 6.2.  Pt has done well since cath -no chest pain or SOB.  He was seen and evaluated by Dr. Harl Bowie today and found stable for discharge. He was seen by cardiac rehab on the 12th with review of tobacco cessation, ambulation and exercise guidelines. He will need TOC 5-7 day follow up and instructions sent to office.   To [place] office pt lives in [place].  Pt aware meds sent to [facility] on [address] here in [place] - his pharmacy is closed today.   Acute MI on admission?:  Yes                               AHA/ACC Clinical Performance & Quality Measures: 1. Aspirin prescribed? -  Yes 2. ADP Receptor Inhibitor (Plavix/Clopidogrel, Brilinta/Ticagrelor or Effient/Prasugrel) prescribed (includes medically managed patients)? - Yes 3. Beta Blocker prescribed? - Yes 4. High Intensity Statin (Lipitor 40-80mg  or Crestor 20-40mg ) prescribed? - Yes 5. EF assessed during THIS hospitalization? - Yes 6. For EF <40%, was ACEI/ARB prescribed? - Not Applicable (EF >/= [id number]) 7. For EF <40%, Aldosterone Antagonist (Spironolactone or Eplerenone) prescribed? - Not Applicable (EF >/= [id number]) 8. Cardiac Rehab Phase II ordered (Included Medically managed Patients)? - Yes   _____________  Discharge Vitals Blood pressure 119/81, pulse 71, temperature 98.8 F (37.1 C), temperature source Oral, resp. rate 14, height 6\' 2"  (1.88 m), weight 96.9 kg, SpO2 99 %.  Filed Weights   03/20/19 2128 03/23/19 0512  Weight: 109 kg 96.9 kg    Labs & Radiologic Studies    CBC Recent Labs    03/20/19 2129 03/20/19 2344 03/21/19 2346  WBC 16.5* 15.3* 12.8*  NEUTROABS 8.0*  --   --   HGB 12.1* 12.4* 11.5*  HCT 37.5* 37.9* 34.6*  MCV 92.4 91.5 91.1  PLT 288 298 263  Basic Metabolic Panel Recent Labs    03/20/19 2344 03/22/19 0403  NA 138 140  K 3.9 4.3  CL 107 108  CO2 22 23  GLUCOSE 94 98  BUN 14 15  CREATININE 1.50* 1.43*  CALCIUM 8.5* 8.8*   Liver Function Tests Recent Labs    03/20/19 2129  AST 23  ALT 24  ALKPHOS 70  BILITOT 0.5  PROT 6.0*  ALBUMIN 3.2*   No results for input(s): LIPASE, AMYLASE in the last 72 hours. High Sensitivity Troponin:   Recent Labs  Lab 03/20/19 2129 03/20/19 2344 03/21/19 0103  TROPONINIHS 138* 1,145* 4,664*    BNP Invalid input(s): POCBNP D-Dimer No results for input(s): DDIMER in the last 72 hours. Hemoglobin A1C Recent Labs    03/20/19 2344  HGBA1C 6.2*   Fasting Lipid Panel Recent Labs    03/20/19 2344  CHOL 165  HDL 48  LDLCALC 106*  TRIG 56  CHOLHDL 3.4   Thyroid Function Tests Recent Labs    03/20/19 2344  TSH  1.999   _____________  CARDIAC CATHETERIZATION  Result Date: 03/20/2019  Dist LAD lesion is 100% stenosed.  1st Mrg lesion is 100% stenosed.  The left ventricular systolic function is normal.  The left ventricular ejection fraction is 50-55% by visual estimate.  Post intervention, there is a 0% residual stenosis.  Probable embolic occlusion of the distal OM branch of the circumflex as well as distal LAD. Normal RCA with superior takeoff. Low normal global LV function with EF estimated 50 to 55% and focal area of mid anterolateral and focal apical hypocontractility. Low level PTCA of the distal circumflex with improvement of flow from TIMI 0 to TIMI I in a small caliber distal vessel. Spontaneous reperfusion of the LAD which ultimately wraps around the LV apex. RECOMMENDATION: Aggrastat was started due to high suspect for embolic etiology rather than atherosclerotic plaque; continue for 18 hours post procedure.  DAPT with aspirin/Brilinta.  Smoking cessation is imperative.  We will plan for echo Doppler study in a.m.   ECHOCARDIOGRAM COMPLETE  Result Date: 03/21/2019   ECHOCARDIOGRAM REPORT   Patient Name:   Michael [name] Date of Exam: 03/21/2019 Medical Rec #:  [id number]    Height:       74.0 in Accession #:    [id number]   Weight:       240.3 lb Date of Birth:  [birth year]   BSA:          2.35 m Patient Age:    58 years     BP:           138/88 mmHg Patient Gender: M            HR:           65 bpm. Exam Location:  Inpatient Procedure: 2D Echo, Color Doppler and Cardiac Doppler Indications:    Acute MI (410)  History:        Patient has no prior history of Echocardiogram examinations.                 STEMI; Risk Factors:Sleep Apnea. Sarcoidosis.  Sonographer:    Raquel Sarna Senior RDCS Referring Phys: [facility]  1. Left ventricular ejection fraction, by visual estimation, is 60 to 65%. The left ventricle has normal function. There is mildly increased left ventricular hypertrophy.  2.  The left ventricle has no regional wall motion abnormalities.  3. Global right ventricle has normal systolic function.The right ventricular  size is normal. No increase in right ventricular wall thickness.  4. Left atrial size was normal.  5. Right atrial size was normal.  6. The mitral valve is normal in structure. No evidence of mitral valve regurgitation.  7. The tricuspid valve is normal in structure. Tricuspid valve regurgitation is not demonstrated.  8. The aortic valve is grossly normal. Aortic valve regurgitation is not visualized. No evidence of aortic valve sclerosis or stenosis.  9. The pulmonic valve was normal in structure. Pulmonic valve regurgitation is not visualized. 10. Technically difficult echo with poor image quality. 11. The atrial septum is grossly normal. FINDINGS  Left Ventricle: Left ventricular ejection fraction, by visual estimation, is 60 to 65%. The left ventricle has normal function. The left ventricle has no regional wall motion abnormalities. There is mildly increased left ventricular hypertrophy. Right Ventricle: The right ventricular size is normal. No increase in right ventricular wall thickness. Global RV systolic function is has normal systolic function. Left Atrium: Left atrial size was normal in size. Right Atrium: Right atrial size was normal in size Pericardium: There is no evidence of pericardial effusion. Mitral Valve: The mitral valve is normal in structure. No evidence of mitral valve regurgitation. Tricuspid Valve: The tricuspid valve is normal in structure. Tricuspid valve regurgitation is not demonstrated. Aortic Valve: The aortic valve is grossly normal. Aortic valve regurgitation is not visualized. The aortic valve is structurally normal, with no evidence of sclerosis or stenosis. Pulmonic Valve: The pulmonic valve was normal in structure. Pulmonic valve regurgitation is not visualized. Pulmonic regurgitation is not visualized. Aorta: The aortic root and ascending  aorta are structurally normal, with no evidence of dilitation. IAS/Shunts: The atrial septum is grossly normal. Additional Comments: Technically difficult echo with poor image quality.  LEFT VENTRICLE PLAX 2D LVIDd:         4.65 cm       Diastology LVIDs:         3.50 cm       LV e' lateral:   6.53 cm/s LV PW:         0.96 cm       LV E/e' lateral: 9.1 LV IVS:        0.96 cm       LV e' medial:    7.18 cm/s LVOT diam:     2.00 cm       LV E/e' medial:  8.2 LV SV:         49 ml LV SV Index:   20.31 LVOT Area:     3.14 cm  LV Volumes (MOD) LV area d, A2C:    35.10 cm LV area d, A4C:    36.80 cm LV area s, A2C:    23.30 cm LV area s, A4C:    22.50 cm LV major d, A2C:   9.20 cm LV major d, A4C:   9.21 cm LV major s, A2C:   7.74 cm LV major s, A4C:   7.85 cm LV vol d, MOD A2C: 112.0 ml LV vol d, MOD A4C: 125.0 ml LV vol s, MOD A2C: 58.8 ml LV vol s, MOD A4C: 54.5 ml LV SV MOD A2C:     53.2 ml LV SV MOD A4C:     125.0 ml LV SV MOD BP:      61.3 ml RIGHT VENTRICLE RV S prime:     9.03 cm/s TAPSE (M-mode): 1.8 cm LEFT ATRIUM  Index       RIGHT ATRIUM           Index LA diam:        2.90 cm 1.23 cm/m  RA Area:     16.20 cm LA Vol (A2C):   50.9 ml 21.66 ml/m RA Volume:   42.50 ml  18.09 ml/m LA Vol (A4C):   27.0 ml 11.49 ml/m LA Biplane Vol: 37.4 ml 15.92 ml/m  AORTIC VALVE LVOT Vmax:   93.80 cm/s LVOT Vmean:  64.700 cm/s LVOT VTI:    0.193 m  AORTA Ao Root diam: 2.60 cm Ao Asc diam:  3.00 cm MITRAL VALVE MV Area (PHT): 2.80 cm             SHUNTS MV PHT:        78.59 msec           Systemic VTI:  0.19 m MV Decel Time: 271 msec             Systemic Diam: 2.00 cm MV E velocity: 59.20 cm/s 103 cm/s MV A velocity: 43.70 cm/s 70.3 cm/s MV E/A ratio:  1.35       1.5  Mertie Moores MD Electronically signed by Mertie Moores MD Signature Date/Time: 03/21/2019/12:17:53 PM    Final    Disposition   Pt is being discharged home today in good condition.  Follow-up Plans & Appointments  Call [facility] at [phone] if any bleeding, swelling or drainage at cath site.  May shower, no tub baths for 48 hours for groin sticks. No lifting over 5 pounds for 5 days.  No Driving for 5 days  Take 1 NTG, under your tongue, while sitting.  If no relief of pain may repeat NTG, one tab every 5 minutes up to 3 tablets total over 15 minutes.  If no relief CALL 911.  If you have dizziness/lightheadness  while taking NTG, stop taking and call 911.        Heart healthy diet, you should decrease your sugar - your levels are running high.  Decrease white bread better to use whole wheat .  Check your bottle to make sure prednisone is same if NOT do not take new prescription.    You need to take asprin daily and brilianta  twice a day- this is to help prevent clots that caused your heart attack.  Very important not to stop.      Follow-up Information    Branch, Alphonse Guild, MD Follow up.   Specialty: Cardiology Why: that office should call you Monday for follow up appt.  if you have not heard by Tuesday please call the office. Contact information: [place] [address] [phone]        Neale Burly, MD. Schedule an appointment as soon as possible for a visit.   Specialty: Internal Medicine Why: see Dr. Quentin Mulling this week to make sure medications are correct. Contact information: [place] [phone]          Discharge Instructions    Amb Referral to Cardiac Rehabilitation   Complete by: As directed    Diagnosis:  PTCA STEMI     After initial evaluation and assessments completed: Virtual Based Care may be provided alone or in conjunction with Phase 2 Cardiac Rehab based on patient barriers.: Yes      Discharge Medications   Allergies as of 03/23/2019  Reactions   Methadone Other (See Comments)   REACTION: "Become mentally unstable"      Medication List    STOP taking these medications    promethazine 12.5 MG tablet Commonly known as: PHENERGAN     TAKE these medications   acetaminophen 325 MG tablet Commonly known as: TYLENOL Take 2 tablets (650 mg total) by mouth every 4 (four) hours as needed for headache or mild pain.   albuterol (2.5 MG/3ML) 0.083% nebulizer solution Commonly known as: PROVENTIL Take 2.5 mg by nebulization every 6 (six) hours as needed for wheezing or shortness of breath.   aspirin 81 MG chewable tablet Chew 1 tablet (81 mg total) by mouth daily. Start taking on: March 24, 2019   atorvastatin 80 MG tablet Commonly known as: LIPITOR Take 1 tablet (80 mg total) by mouth daily at 6 PM. What changed:   medication strength  how much to take  when to take this   lisinopril 5 MG tablet Commonly known as: ZESTRIL Take 1 tablet (5 mg total) by mouth daily. Start taking on: March 24, 2019   metoprolol succinate 25 MG 24 hr tablet Commonly known as: TOPROL-XL Take 0.5 tablets (12.5 mg total) by mouth daily. Start taking on: March 24, 2019   nitroGLYCERIN 0.4 MG SL tablet Commonly known as: NITROSTAT Place 1 tablet (0.4 mg total) under the tongue every 5 (five) minutes x 3 doses as needed for chest pain.   predniSONE 20 MG tablet Commonly known as: DELTASONE Take 1 tablet (20 mg total) by mouth daily with breakfast.   pregabalin 200 MG capsule Commonly known as: LYRICA Take 200 mg by mouth 3 (three) times daily.   tetrahydrozoline-zinc 0.05-0.25 % ophthalmic solution Commonly known as: VISINE-AC Place 2 drops into both eyes 3 (three) times daily as needed (dry/red eyes).   ticagrelor 90 MG Tabs tablet Commonly known as: BRILINTA Take 1 tablet (90 mg total) by mouth 2 (two) times daily.          Outstanding Labs/Studies   BMP  Duration of Discharge Encounter   Greater than 30 minutes including physician time.  Signed, Cecilie Kicks, NP 03/23/2019, 10:01 AM

## 2019-03-23 NOTE — Plan of Care (Signed)
  Problem: Education: Goal: Knowledge of General Education information will improve Description: Including pain rating scale, medication(s)/side effects and non-pharmacologic comfort measures Outcome: Adequate for Discharge   Problem: Health Behavior/Discharge Planning: Goal: Ability to manage health-related needs will improve Outcome: Adequate for Discharge   Problem: Clinical Measurements: Goal: Ability to maintain clinical measurements within normal limits will improve Outcome: Adequate for Discharge Goal: Diagnostic test results will improve Outcome: Adequate for Discharge Goal: Cardiovascular complication will be avoided Outcome: Adequate for Discharge   Problem: Activity: Goal: Risk for activity intolerance will decrease Outcome: Adequate for Discharge   

## 2019-03-24 ENCOUNTER — Telehealth: Payer: Self-pay | Admitting: Medical

## 2019-03-24 NOTE — Telephone Encounter (Signed)
New message   Per Cecilie Kicks scheduled a TOC appt with Roby Lofts on 03/31/2019 at 9:00 am.

## 2019-03-25 NOTE — Telephone Encounter (Signed)
Lm to call back second message .Adonis Housekeeper

## 2019-03-26 NOTE — Telephone Encounter (Signed)
Patient contacted regarding discharge from [facility] on 03/23/19  Patient understands to follow up with provider Roby Lofts PA on 03/31/19 at 9;00 AM at [facility]. Patient understands discharge instructions? YES Patient understands medications and regiment? YES Patient understands to bring all medications to this visit? YES PER PT MAY CANCEL APPT the patient HAS TRANSPORTATION ISSUES WILL TRY AND KEEP APPT

## 2019-03-28 DIAGNOSIS — Z09 Encounter for follow-up examination after completed treatment for conditions other than malignant neoplasm: Secondary | ICD-10-CM | POA: Diagnosis not present

## 2019-03-28 NOTE — Progress Notes (Deleted)
Cardiology Office Note   Date:  03/28/2019   ID:  Michael [name], DOB 1961-01-29, MRN [id number]  PCP:  Neale Burly, MD  Cardiologist:  Carlyle Dolly, MD EP: None  No chief complaint on file.     History of Present Illness: Michael [name] is a 58 y.o. male with a PMH of recent STEMI 03/20/2019 2/2 with PCI to distal OM branch of LCx 2/2 emboli rather than atherosclerotic plaque, HTN, HLD, pulmonary sarcoidosis, CKD stage 3, and tobacco abuse, who presents for post hospital follow-up.   He presented to [facility] ED 03/20/2019 with complaints of chest pain. He was markedly hypertensive on arrival and EKG revealed inferior STE. Emergent LHC showed 100% distal LAD stenosis and 100% 1st marginal stenosis felt to be embolic in nature rather than atherosclerotic plaque. He had PCI to 1st Marginal lesion and was recommended for DAPT with aspirin and brilinta. Echo showed EF 60-65%, mild LVH, no RWMA, and no significant valvular abnormalities. Smoking cessation was strongly encouraged.   He presents today for follow-up of recent hospitalization.   1. Recent STEMI: felt to be embolic in nature rather than atherosclerotic plaque.  - Continue aspirin and brilinta - Continue metoprolol and statin for risk factor modifications  2. HTN: BP *** today - Continue metoprolol and losartan  3. HLD: LDL 106 03/20/2019 - Continue atorvastatin - Will check FLP and LFTs in 6 weeks for close monitoring.   4. Tobacco abuse:  - Continue to encourage smoking cessation  TOC VISIT ****   Past Medical History:  Diagnosis Date  . Allergic rhinitis   . Arthritis   . Chronic low back pain    follwed in Pain Clinic(Dr.Kirchmayer)  . Depression   . Depression with anxiety   . GERD (gastroesophageal reflux disease)   . History of kidney stones    passed  . HLD (hyperlipidemia) 03/23/2019  . Insomnia   . Migraines   . Sarcoidosis    Multisystem,pulmonary and hepatic (Dr.Rourk)Liver bx in 06 mildly  postive AMA but on re-check normal  . Sleep apnea     Past Surgical History:  Procedure Laterality Date  . BACK SURGERY  2012   2nd back surgery 3 monthes ago and in 2007  . COLONOSCOPY  11/05/2003   [id number] internal hemorrhoids, otherwise normal rectum and colon  . CORONARY ARTERY BYPASS GRAFT  2017  . CORONARY/GRAFT ACUTE MI REVASCULARIZATION N/A 03/20/2019   Procedure: Coronary/Graft Acute MI Revascularization;  Surgeon: Troy Sine, MD;  Location: [place] [facility];  Service: Cardiovascular;  Laterality: N/A;  . ESOPHAGOGASTRODUODENOSCOPY  03/22/2004   [id number] esophagogastroduodenoscopy. Small hiatal hernia otherwise normal stomach  . HARVEST BONE GRAFT Left    thigh  . HIP ARTHROPLASTY Bilateral    left 2017, right 2016  . HIP SURGERY Left 2017   bone graft  . LAMINECTOMY  2009   Balateral L4-L5  . LEFT HEART CATH AND CORONARY ANGIOGRAPHY N/A 03/20/2019   Procedure: LEFT HEART CATH AND CORONARY ANGIOGRAPHY;  Surgeon: Troy Sine, MD;  Location: [place] [facility];  Service: Cardiovascular;  Laterality: N/A;  . RESECTION DISTAL CLAVICAL Right 01/23/2019   Procedure: OPEN DISTAL CLAVICLE EXCISION;  Surgeon: Carole Civil, MD;  Location: [facility];  Service: Orthopedics;  Laterality: Right;  . TOOTH EXTRACTION Bilateral 12/28/2017   Procedure: DENTAL RESTORATION/EXTRACTIONS;  Surgeon: Diona Browner, DDS;  Location: [place];  Service: Oral Surgery;  Laterality: Bilateral;     Current Outpatient Medications  Medication Sig Dispense Refill  . acetaminophen (TYLENOL) 325 MG tablet Take 2 tablets (650 mg total) by mouth every 4 (four) hours as needed for headache or mild pain.    Marland Kitchen albuterol (PROVENTIL) (2.5 MG/3ML) 0.083% nebulizer solution Take 2.5 mg by nebulization every 6 (six) hours as needed for wheezing or shortness of breath.     Marland Kitchen aspirin 81 MG chewable tablet Chew 1 tablet (81 mg total) by mouth daily.    Marland Kitchen atorvastatin (LIPITOR) 80 MG tablet Take 1 tablet  (80 mg total) by mouth daily at 6 PM. 30 tablet 6  . lisinopril (ZESTRIL) 5 MG tablet Take 1 tablet (5 mg total) by mouth daily. 30 tablet 6  . metoprolol succinate (TOPROL-XL) 25 MG 24 hr tablet Take 0.5 tablets (12.5 mg total) by mouth daily. 15 tablet 6  . nitroGLYCERIN (NITROSTAT) 0.4 MG SL tablet Place 1 tablet (0.4 mg total) under the tongue every 5 (five) minutes x 3 doses as needed for chest pain. 25 tablet 4  . predniSONE (DELTASONE) 20 MG tablet Take 1 tablet (20 mg total) by mouth daily with breakfast. 30 tablet 1  . pregabalin (LYRICA) 200 MG capsule Take 200 mg by mouth 3 (three) times daily.     Marland Kitchen tetrahydrozoline-zinc (VISINE-AC) 0.05-0.25 % ophthalmic solution Place 2 drops into both eyes 3 (three) times daily as needed (dry/red eyes).    . ticagrelor (BRILINTA) 90 MG TABS tablet Take 1 tablet (90 mg total) by mouth 2 (two) times daily. 60 tablet 11   No current facility-administered medications for this visit.    Allergies:   Methadone    Social History:  The patient  reports that he has been smoking cigarettes. He has a 14.50 pack-year smoking history. He has never used smokeless tobacco. He reports current alcohol use. He reports that he does not use drugs.   Family History:  The patient's ***family history includes Colon cancer in his brother.    ROS:  Please see the history of present illness.   Otherwise, review of systems are positive for {NONE DEFAULTED:18576::"none"}.   All other systems are reviewed and negative.    PHYSICAL EXAM: VS:  There were no vitals taken for this visit. , BMI There is no height or weight on file to calculate BMI. GEN: Well nourished, well developed, in no acute distress HEENT: normal Neck: no JVD, carotid bruits, or masses Cardiac: ***RRR; no murmurs, rubs, or gallops,no edema  Respiratory:  clear to auscultation bilaterally, normal work of breathing GI: soft, nontender, nondistended, + BS MS: no deformity or atrophy Skin: warm and  dry, no rash Neuro:  Strength and sensation are intact Psych: euthymic mood, full affect   EKG:  EKG {ACTION; IS/IS [id number] ordered today. The ekg ordered today demonstrates ***   Recent Labs: 03/20/2019: ALT 24; B Natriuretic Peptide 34.8; TSH 1.999 03/21/2019: Hemoglobin 11.5; Platelets 263 03/22/2019: BUN 15; Creatinine, Ser 1.43; Potassium 4.3; Sodium 140    Lipid Panel    Component Value Date/Time   CHOL 165 03/20/2019 2344   TRIG 56 03/20/2019 2344   HDL 48 03/20/2019 2344   CHOLHDL 3.4 03/20/2019 2344   VLDL 11 03/20/2019 2344   LDLCALC 106 (H) 03/20/2019 2344      Wt Readings from Last 3 Encounters:  03/23/19 213 lb 9.6 oz (96.9 kg)  01/21/19 212 lb (96.2 kg)  01/14/19 212 lb (96.2 kg)      Other studies Reviewed: Additional studies/ records that were reviewed today  include:   Emergent Cath and PCI 03/20/19  Dist LAD lesion is 100% stenosed.  1st Mrg lesion is 100% stenosed.  The left ventricular systolic function is normal.  The left ventricular ejection fraction is 50-55% by visual estimate.  Post intervention, there is a 0% residual stenosis.  Probable embolic occlusion of the distal OM branch of the circumflex as well as distal LAD.  Normal RCA with superior takeoff.  Low normal global LV function with EF estimated 50 to 55% and focal area of mid anterolateral and focal apical hypocontractility.  Low level PTCA of the distal circumflex with improvement of flow from TIMI 0 to TIMI I in a small caliber distal vessel.  Spontaneous reperfusion of the LAD which ultimately wraps around the LV apex.  RECOMMENDATION: Aggrastat was started due to high suspect for embolic etiology rather than atherosclerotic plaque; continue for 18 hours post procedure. DAPT with aspirin/Brilinta. Smoking cessation is imperative. We will plan for echo Doppler study in a.m.  Echocardiogram 03/21/2019: 1. Left ventricular ejection fraction, by visual  estimation, is 60 to 65%. The left ventricle has normal function. There is mildly increased left ventricular hypertrophy.  2. The left ventricle has no regional wall motion abnormalities.  3. Global right ventricle has normal systolic function.The right ventricular size is normal. No increase in right ventricular wall thickness.  4. Left atrial size was normal.  5. Right atrial size was normal.  6. The mitral valve is normal in structure. No evidence of mitral valve regurgitation.  7. The tricuspid valve is normal in structure. Tricuspid valve regurgitation is not demonstrated.  8. The aortic valve is grossly normal. Aortic valve regurgitation is not visualized. No evidence of aortic valve sclerosis or stenosis.  9. The pulmonic valve was normal in structure. Pulmonic valve regurgitation is not visualized. 10. Technically difficult echo with poor image quality. 11. The atrial septum is grossly normal.  ASSESSMENT AND PLAN:  1.  ***   Current medicines are reviewed at length with the patient today.  The patient {ACTIONS; HAS/DOES NOT HAVE:19233} concerns regarding medicines.  The following changes have been made:  {PLAN; NO CHANGE:13088:s}  Labs/ tests ordered today include: *** No orders of the defined types were placed in this encounter.    Disposition:   FU with *** in {gen number [id number] {Days to years:10300}  Signed, Abigail Butts, PA-C  03/28/2019 4:08 PM

## 2019-03-31 ENCOUNTER — Ambulatory Visit: Payer: Medicare Other | Admitting: Medical

## 2019-04-08 DIAGNOSIS — G4700 Insomnia, unspecified: Secondary | ICD-10-CM | POA: Diagnosis not present

## 2019-04-08 DIAGNOSIS — M961 Postlaminectomy syndrome, not elsewhere classified: Secondary | ICD-10-CM | POA: Diagnosis not present

## 2019-04-08 DIAGNOSIS — M5416 Radiculopathy, lumbar region: Secondary | ICD-10-CM | POA: Diagnosis not present

## 2019-04-16 ENCOUNTER — Telehealth: Payer: Medicare Other | Admitting: Physician Assistant

## 2019-04-17 ENCOUNTER — Telehealth: Payer: Self-pay | Admitting: Cardiology

## 2019-04-17 ENCOUNTER — Other Ambulatory Visit: Payer: Self-pay

## 2019-04-17 ENCOUNTER — Encounter: Payer: Self-pay | Admitting: Cardiology

## 2019-04-17 ENCOUNTER — Ambulatory Visit (INDEPENDENT_AMBULATORY_CARE_PROVIDER_SITE_OTHER): Payer: Medicare Other | Admitting: Cardiology

## 2019-04-17 VITALS — BP 115/75 | HR 60 | Ht 74.0 in | Wt 228.6 lb

## 2019-04-17 DIAGNOSIS — I252 Old myocardial infarction: Secondary | ICD-10-CM

## 2019-04-17 DIAGNOSIS — R002 Palpitations: Secondary | ICD-10-CM | POA: Diagnosis not present

## 2019-04-17 NOTE — Telephone Encounter (Signed)
Pre-cert Verification for the following procedure    30 DAY EVENT MONITOR

## 2019-04-17 NOTE — Progress Notes (Signed)
Clinical Summary Michael [name] is a 59 y.o.male seen today for follow up of the following medical problems.   1. History of embolic STEMI - admitted 03/2019 with STEMI - cath catho showed occluded distal LAD and OM1, probable emoblic occlusion. Spontaneous reprfusion of LAD, no clear atherosclerotic plaque - 03/2019 echo LVEF 60-65%, normal RV - unclear source of emboli. No significaint finding by echo. No afib has been noted this admission  - walking regularly, up to 4-5 miles without troubles. - no recent chest pain, no DOE - compliant with meds.      Past Medical History:  Diagnosis Date  . Allergic rhinitis   . Arthritis   . Chronic low back pain    follwed in Pain Clinic(Dr.Kirchmayer)  . Depression   . Depression with anxiety   . GERD (gastroesophageal reflux disease)   . History of kidney stones    passed  . HLD (hyperlipidemia) 03/23/2019  . Insomnia   . Migraines   . Sarcoidosis    Multisystem,pulmonary and hepatic (Dr.Rourk)Liver bx in 06 mildly postive AMA but on re-check normal  . Sleep apnea      Allergies  Allergen Reactions  . Methadone Other (See Comments)    REACTION: "Become mentally unstable"     Current Outpatient Medications  Medication Sig Dispense Refill  . acetaminophen (TYLENOL) 325 MG tablet Take 2 tablets (650 mg total) by mouth every 4 (four) hours as needed for headache or mild pain.    Marland Kitchen albuterol (PROVENTIL) (2.5 MG/3ML) 0.083% nebulizer solution Take 2.5 mg by nebulization every 6 (six) hours as needed for wheezing or shortness of breath.     Marland Kitchen aspirin 81 MG chewable tablet Chew 1 tablet (81 mg total) by mouth daily.    Marland Kitchen atorvastatin (LIPITOR) 80 MG tablet Take 1 tablet (80 mg total) by mouth daily at 6 PM. 30 tablet 6  . lisinopril (ZESTRIL) 5 MG tablet Take 1 tablet (5 mg total) by mouth daily. 30 tablet 6  . metoprolol succinate (TOPROL-XL) 25 MG 24 hr tablet Take 0.5 tablets (12.5 mg total) by mouth daily. 15 tablet 6  .  nitroGLYCERIN (NITROSTAT) 0.4 MG SL tablet Place 1 tablet (0.4 mg total) under the tongue every 5 (five) minutes x 3 doses as needed for chest pain. 25 tablet 4  . predniSONE (DELTASONE) 20 MG tablet Take 1 tablet (20 mg total) by mouth daily with breakfast. 30 tablet 1  . pregabalin (LYRICA) 200 MG capsule Take 200 mg by mouth 3 (three) times daily.     Marland Kitchen tetrahydrozoline-zinc (VISINE-AC) 0.05-0.25 % ophthalmic solution Place 2 drops into both eyes 3 (three) times daily as needed (dry/red eyes).    . ticagrelor (BRILINTA) 90 MG TABS tablet Take 1 tablet (90 mg total) by mouth 2 (two) times daily. 60 tablet 11   No current facility-administered medications for this visit.     Past Surgical History:  Procedure Laterality Date  . BACK SURGERY  2012   2nd back surgery 3 monthes ago and in 2007  . COLONOSCOPY  11/05/2003   [id number] internal hemorrhoids, otherwise normal rectum and colon  . CORONARY ARTERY BYPASS GRAFT  2017  . CORONARY/GRAFT ACUTE MI REVASCULARIZATION N/A 03/20/2019   Procedure: Coronary/Graft Acute MI Revascularization;  Surgeon: Troy Sine, MD;  Location: [place] [facility];  Service: Cardiovascular;  Laterality: N/A;  . ESOPHAGOGASTRODUODENOSCOPY  03/22/2004   [id number] esophagogastroduodenoscopy. Small hiatal hernia otherwise normal stomach  . HARVEST BONE GRAFT  Left    thigh  . HIP ARTHROPLASTY Bilateral    left 2017, right 2016  . HIP SURGERY Left 2017   bone graft  . LAMINECTOMY  2009   Balateral L4-L5  . LEFT HEART CATH AND CORONARY ANGIOGRAPHY N/A 03/20/2019   Procedure: LEFT HEART CATH AND CORONARY ANGIOGRAPHY;  Surgeon: Troy Sine, MD;  Location: [place] [facility];  Service: Cardiovascular;  Laterality: N/A;  . RESECTION DISTAL CLAVICAL Right 01/23/2019   Procedure: OPEN DISTAL CLAVICLE EXCISION;  Surgeon: Carole Civil, MD;  Location: [facility];  Service: Orthopedics;  Laterality: Right;  . TOOTH EXTRACTION Bilateral 12/28/2017   Procedure:  DENTAL RESTORATION/EXTRACTIONS;  Surgeon: Diona Browner, DDS;  Location: [place];  Service: Oral Surgery;  Laterality: Bilateral;     Allergies  Allergen Reactions  . Methadone Other (See Comments)    REACTION: "Become mentally unstable"      Family History  Problem Relation Age of Onset  . Colon cancer Brother        in his 59s     Social History Michael [name] reports that he has been smoking cigarettes. He has a 14.50 pack-year smoking history. He has never used smokeless tobacco. Michael [name] reports current alcohol use.   Review of Systems CONSTITUTIONAL: No weight loss, fever, chills, weakness or fatigue.  HEENT: Eyes: No visual loss, blurred vision, double vision or yellow sclerae.No hearing loss, sneezing, congestion, runny nose or sore throat.  SKIN: No rash or itching.  CARDIOVASCULAR: per hpi RESPIRATORY: No shortness of breath, cough or sputum.  GASTROINTESTINAL: No anorexia, nausea, vomiting or diarrhea. No abdominal pain or blood.  GENITOURINARY: No burning on urination, no polyuria NEUROLOGICAL: No headache, dizziness, syncope, paralysis, ataxia, numbness or tingling in the extremities. No change in bowel or bladder control.  MUSCULOSKELETAL: No muscle, back pain, joint pain or stiffness.  LYMPHATICS: No enlarged nodes. No history of splenectomy.  PSYCHIATRIC: No history of depression or anxiety.  ENDOCRINOLOGIC: No reports of sweating, cold or heat intolerance. No polyuria or polydipsia.  Marland Kitchen   Physical Examination Today's Vitals   04/17/19 1250  BP: 115/75  Pulse: 60  SpO2: 98%  Weight: 228 lb 9.6 oz (103.7 kg)  Height: 6\' 2"  (1.88 m)   Body mass index is 29.35 kg/m.  Gen: resting comfortably, no acute distress HEENT: no scleral icterus, pupils equal round and reactive, no palptable cervical adenopathy,  CV: RRR, no m/r/g, no jvd Resp: Clear to auscultation bilaterally GI: abdomen is soft, non-tender, non-distended, normal bowel sounds, no  hepatosplenomegaly MSK: extremities are warm, no edema.  Skin: warm, no rash Neuro:  no focal deficits Psych: appropriate affect   Diagnostic Studies  03/2019 cath  Dist LAD lesion is 100% stenosed.  1st Mrg lesion is 100% stenosed.  The left ventricular systolic function is normal.  The left ventricular ejection fraction is 50-55% by visual estimate.  Post intervention, there is a 0% residual stenosis.   Probable embolic occlusion of the distal OM Lexine Jaspers of the circumflex as well as distal LAD.  Normal RCA with superior takeoff.  Low normal global LV function with EF estimated 50 to 55% and focal area of mid anterolateral and focal apical hypocontractility.  Low level PTCA of the distal circumflex with improvement of flow from TIMI 0 to TIMI I in a small caliber distal vessel.  Spontaneous reperfusion of the LAD which ultimately wraps around the LV apex.  RECOMMENDATION: Aggrastat was started due to high suspect for embolic etiology  rather than atherosclerotic plaque; continue for 18 hours post procedure.  DAPT with aspirin/Brilinta.  Smoking cessation is imperative.  We will plan for echo Doppler study in a.m.   03/2019 echo IMPRESSIONS    1. Left ventricular ejection fraction, by visual estimation, is 60 to 65%. The left ventricle has normal function. There is mildly increased left ventricular hypertrophy.  2. The left ventricle has no regional wall motion abnormalities.  3. Global right ventricle has normal systolic function.The right ventricular size is normal. No increase in right ventricular wall thickness.  4. Left atrial size was normal.  5. Right atrial size was normal.  6. The mitral valve is normal in structure. No evidence of mitral valve regurgitation.  7. The tricuspid valve is normal in structure. Tricuspid valve regurgitation is not demonstrated.  8. The aortic valve is grossly normal. Aortic valve regurgitation is not visualized. No evidence of  aortic valve sclerosis or stenosis.  9. The pulmonic valve was normal in structure. Pulmonic valve regurgitation is not visualized. 10. Technically difficult echo with poor image quality. 11. The atrial septum is grossly normal.  Assessment and Plan  1. History of STEMI - appeared to be a cardioembolic MI, did not require stenting - unclear source of thromboembolism. We will obtain a 30 day event monitor to evaluate for any underlying afib - continue current meds   F/u 3 months    Arnoldo Lenis, M.D.

## 2019-04-17 NOTE — Patient Instructions (Signed)
Your physician recommends that you schedule a follow-up appointment in: [place]  Your physician recommends that you continue on your current medications as directed. Please refer to the Current Medication list given to you today.  Your physician has recommended that you wear an event monitor 30 DAY EVENT. Event monitors are medical devices that record the heart's electrical activity. Doctors most often [place] these monitors to diagnose arrhythmias. Arrhythmias are problems with the speed or rhythm of the heartbeat. The monitor is a small, portable device. You can wear one while you do your normal daily activities. This is usually used to diagnose what is causing palpitations/syncope (passing out).  Thank you for choosing [facility]!!

## 2019-04-23 DIAGNOSIS — K921 Melena: Secondary | ICD-10-CM | POA: Diagnosis not present

## 2019-04-23 DIAGNOSIS — M545 Low back pain: Secondary | ICD-10-CM | POA: Diagnosis not present

## 2019-04-27 DIAGNOSIS — R002 Palpitations: Secondary | ICD-10-CM | POA: Diagnosis not present

## 2019-04-28 ENCOUNTER — Encounter (INDEPENDENT_AMBULATORY_CARE_PROVIDER_SITE_OTHER): Payer: Medicare Other

## 2019-04-28 DIAGNOSIS — R002 Palpitations: Secondary | ICD-10-CM

## 2019-04-29 ENCOUNTER — Other Ambulatory Visit: Payer: Self-pay | Admitting: Cardiology

## 2019-05-08 ENCOUNTER — Telehealth: Payer: Self-pay | Admitting: Cardiology

## 2019-05-08 NOTE — Telephone Encounter (Signed)
[employer] sent strip of 12 beat run of NSVT. Continue to monitor at this time, likely related to scar from prior MI. Will check BMET/Mg.   Zandra Abts MD

## 2019-05-09 ENCOUNTER — Telehealth: Payer: Self-pay | Admitting: *Deleted

## 2019-05-09 NOTE — Telephone Encounter (Signed)
-----   Message from Arnoldo Lenis, MD sent at 05/08/2019  4:20 PM EST ----- Patient had a short episode of an abnormal rhythm on his monitor(NSVT). Can we get a BMET and Mg for him please  Zandra Abts MD

## 2019-05-09 NOTE — Telephone Encounter (Signed)
LM to return call.

## 2019-05-15 DIAGNOSIS — M545 Low back pain: Secondary | ICD-10-CM | POA: Diagnosis not present

## 2019-05-15 DIAGNOSIS — M25511 Pain in right shoulder: Secondary | ICD-10-CM | POA: Diagnosis not present

## 2019-05-15 DIAGNOSIS — M12819 Other specific arthropathies, not elsewhere classified, unspecified shoulder: Secondary | ICD-10-CM | POA: Diagnosis not present

## 2019-05-15 DIAGNOSIS — G8918 Other acute postprocedural pain: Secondary | ICD-10-CM | POA: Diagnosis not present

## 2019-05-19 DIAGNOSIS — R0789 Other chest pain: Secondary | ICD-10-CM | POA: Diagnosis not present

## 2019-05-19 DIAGNOSIS — I249 Acute ischemic heart disease, unspecified: Secondary | ICD-10-CM | POA: Diagnosis not present

## 2019-05-19 DIAGNOSIS — Z720 Tobacco use: Secondary | ICD-10-CM | POA: Diagnosis not present

## 2019-05-19 DIAGNOSIS — Z96643 Presence of artificial hip joint, bilateral: Secondary | ICD-10-CM | POA: Diagnosis not present

## 2019-05-19 DIAGNOSIS — Z885 Allergy status to narcotic agent status: Secondary | ICD-10-CM | POA: Diagnosis not present

## 2019-05-19 DIAGNOSIS — R0689 Other abnormalities of breathing: Secondary | ICD-10-CM | POA: Diagnosis not present

## 2019-05-19 DIAGNOSIS — R079 Chest pain, unspecified: Secondary | ICD-10-CM | POA: Diagnosis not present

## 2019-05-19 DIAGNOSIS — Z20822 Contact with and (suspected) exposure to covid-19: Secondary | ICD-10-CM | POA: Diagnosis not present

## 2019-05-19 DIAGNOSIS — Z79899 Other long term (current) drug therapy: Secondary | ICD-10-CM | POA: Diagnosis not present

## 2019-05-19 NOTE — Progress Notes (Signed)
Received transfer request from [facility].  Patient is a 59 y.o. male with tobacco use, pulmonary sarcoidosis, recent admission with STEMI s/p POBA distal LCX who presents with substernal chest pain that began around 5pm.  He presented to [facility] ED with these symptoms, and initial EKG showed Q waves and ST elevation in inferior leads.  I reviewed EKG with interventional cardiologist on call, Dr Claiborne Billings, who also did his recent PCI.  EKG does not appear changed from his EKG following his cath.  His cath showed distal LAD and LCX disease likely representing embolization, and continued to have poor distal flow following POBA.  Dr Georgina Peer recommended medical management.  Recommended starting heparin gtt and titrating nitro gtt until pain free.  Patient accepted for transfer to [facility].

## 2019-05-20 ENCOUNTER — Encounter (HOSPITAL_COMMUNITY)
Admission: EM | Disposition: A | Discharge status: Home / Self Care | Payer: Self-pay | Source: Other Acute Inpatient Hospital | Attending: Cardiology

## 2019-05-20 ENCOUNTER — Other Ambulatory Visit: Payer: Self-pay

## 2019-05-20 ENCOUNTER — Encounter (HOSPITAL_COMMUNITY): Payer: Self-pay | Admitting: Cardiology

## 2019-05-20 ENCOUNTER — Inpatient Hospital Stay (HOSPITAL_COMMUNITY)
Admission: EM | Admit: 2019-05-20 | Discharge: 2019-05-21 | DRG: 281 | Disposition: A | Discharge status: Home / Self Care | Payer: Medicare Other | Source: Other Acute Inpatient Hospital | Attending: Cardiology | Admitting: Cardiology

## 2019-05-20 ENCOUNTER — Inpatient Hospital Stay (HOSPITAL_COMMUNITY): Payer: Medicare Other

## 2019-05-20 DIAGNOSIS — I130 Hypertensive heart and chronic kidney disease with heart failure and stage 1 through stage 4 chronic kidney disease, or unspecified chronic kidney disease: Secondary | ICD-10-CM | POA: Diagnosis present

## 2019-05-20 DIAGNOSIS — Z951 Presence of aortocoronary bypass graft: Secondary | ICD-10-CM

## 2019-05-20 DIAGNOSIS — D860 Sarcoidosis of lung: Secondary | ICD-10-CM | POA: Diagnosis not present

## 2019-05-20 DIAGNOSIS — Z955 Presence of coronary angioplasty implant and graft: Secondary | ICD-10-CM

## 2019-05-20 DIAGNOSIS — Z20822 Contact with and (suspected) exposure to covid-19: Secondary | ICD-10-CM | POA: Diagnosis not present

## 2019-05-20 DIAGNOSIS — I2510 Atherosclerotic heart disease of native coronary artery without angina pectoris: Secondary | ICD-10-CM | POA: Diagnosis not present

## 2019-05-20 DIAGNOSIS — F17210 Nicotine dependence, cigarettes, uncomplicated: Secondary | ICD-10-CM | POA: Diagnosis present

## 2019-05-20 DIAGNOSIS — E785 Hyperlipidemia, unspecified: Secondary | ICD-10-CM | POA: Diagnosis present

## 2019-05-20 DIAGNOSIS — Z7982 Long term (current) use of aspirin: Secondary | ICD-10-CM

## 2019-05-20 DIAGNOSIS — I214 Non-ST elevation (NSTEMI) myocardial infarction: Principal | ICD-10-CM | POA: Diagnosis present

## 2019-05-20 DIAGNOSIS — I428 Other cardiomyopathies: Secondary | ICD-10-CM | POA: Diagnosis not present

## 2019-05-20 DIAGNOSIS — I5022 Chronic systolic (congestive) heart failure: Secondary | ICD-10-CM | POA: Diagnosis not present

## 2019-05-20 DIAGNOSIS — Z96643 Presence of artificial hip joint, bilateral: Secondary | ICD-10-CM | POA: Diagnosis not present

## 2019-05-20 DIAGNOSIS — N183 Chronic kidney disease, stage 3 unspecified: Secondary | ICD-10-CM | POA: Diagnosis present

## 2019-05-20 DIAGNOSIS — Z79899 Other long term (current) drug therapy: Secondary | ICD-10-CM | POA: Diagnosis not present

## 2019-05-20 DIAGNOSIS — Z7952 Long term (current) use of systemic steroids: Secondary | ICD-10-CM

## 2019-05-20 DIAGNOSIS — I252 Old myocardial infarction: Secondary | ICD-10-CM

## 2019-05-20 DIAGNOSIS — I472 Ventricular tachycardia: Secondary | ICD-10-CM | POA: Diagnosis not present

## 2019-05-20 DIAGNOSIS — Z885 Allergy status to narcotic agent status: Secondary | ICD-10-CM | POA: Diagnosis not present

## 2019-05-20 DIAGNOSIS — I249 Acute ischemic heart disease, unspecified: Secondary | ICD-10-CM | POA: Diagnosis not present

## 2019-05-20 HISTORY — PX: CARDIAC CATHETERIZATION: SHX172

## 2019-05-20 HISTORY — DX: Chronic kidney disease, stage 2 (mild): N18.2

## 2019-05-20 HISTORY — PX: LEFT HEART CATH AND CORONARY ANGIOGRAPHY: CATH118249

## 2019-05-20 HISTORY — DX: Non-ST elevation (NSTEMI) myocardial infarction: I21.4

## 2019-05-20 LAB — MRSA PCR SCREENING: MRSA by PCR: NEGATIVE

## 2019-05-20 LAB — RAPID URINE DRUG SCREEN, HOSP PERFORMED
Amphetamines: NOT DETECTED
Barbiturates: NOT DETECTED
Benzodiazepines: NOT DETECTED
Cocaine: NOT DETECTED
Opiates: POSITIVE — AB
Tetrahydrocannabinol: NOT DETECTED

## 2019-05-20 LAB — TSH: TSH: 2.7 u[IU]/mL (ref 0.350–4.500)

## 2019-05-20 LAB — LIPID PANEL
Cholesterol: 221 mg/dL — ABNORMAL HIGH (ref 0–200)
HDL: 47 mg/dL (ref >40)
LDL Cholesterol: 147 mg/dL — ABNORMAL HIGH (ref 0–99)
Total CHOL/HDL Ratio: 4.7 RATIO
Triglycerides: 135 mg/dL (ref <150)
VLDL: 27 mg/dL (ref 0–40)

## 2019-05-20 LAB — CBC WITH DIFFERENTIAL/PLATELET
Abs Immature Granulocytes: 0.4 10*3/uL — ABNORMAL HIGH (ref 0.00–0.07)
Basophils Absolute: 0.1 10*3/uL (ref 0.0–0.1)
Basophils Relative: 1 %
Eosinophils Absolute: 0.2 10*3/uL (ref 0.0–0.5)
Eosinophils Relative: 1 %
HCT: 36.6 % — ABNORMAL LOW (ref 39.0–52.0)
Hemoglobin: 12 g/dL — ABNORMAL LOW (ref 13.0–17.0)
Immature Granulocytes: 2 %
Lymphocytes Relative: 35 %
Lymphs Abs: 5.8 10*3/uL — ABNORMAL HIGH (ref 0.7–4.0)
MCH: 30.4 pg (ref 26.0–34.0)
MCHC: 32.8 g/dL (ref 30.0–36.0)
MCV: 92.7 fL (ref 80.0–100.0)
Monocytes Absolute: 1.3 10*3/uL — ABNORMAL HIGH (ref 0.1–1.0)
Monocytes Relative: 8 %
Neutro Abs: 9 10*3/uL — ABNORMAL HIGH (ref 1.7–7.7)
Neutrophils Relative %: 53 %
Platelets: 283 10*3/uL (ref 150–400)
RBC: 3.95 MIL/uL — ABNORMAL LOW (ref 4.22–5.81)
RDW: 16.1 % — ABNORMAL HIGH (ref 11.5–15.5)
WBC: 16.8 10*3/uL — ABNORMAL HIGH (ref 4.0–10.5)
nRBC: 0.1 % (ref 0.0–0.2)

## 2019-05-20 LAB — COMPREHENSIVE METABOLIC PANEL
ALT: 20 U/L (ref 0–44)
AST: 55 U/L — ABNORMAL HIGH (ref 15–41)
Albumin: 3.1 g/dL — ABNORMAL LOW (ref 3.5–5.0)
Alkaline Phosphatase: 72 U/L (ref 38–126)
Anion gap: 10 (ref 5–15)
BUN: 14 mg/dL (ref 6–20)
CO2: 24 mmol/L (ref 22–32)
Calcium: 8.8 mg/dL — ABNORMAL LOW (ref 8.9–10.3)
Chloride: 108 mmol/L (ref 98–111)
Creatinine, Ser: 1.39 mg/dL — ABNORMAL HIGH (ref 0.61–1.24)
GFR calc Af Amer: >60 mL/min (ref >60)
GFR calc non Af Amer: 56 mL/min — ABNORMAL LOW (ref >60)
Glucose, Bld: 128 mg/dL — ABNORMAL HIGH (ref 70–99)
Potassium: 3.8 mmol/L (ref 3.5–5.1)
Sodium: 142 mmol/L (ref 135–145)
Total Bilirubin: 0.4 mg/dL (ref 0.3–1.2)
Total Protein: 6.2 g/dL — ABNORMAL LOW (ref 6.5–8.1)

## 2019-05-20 LAB — TROPONIN I (HIGH SENSITIVITY)
Troponin I (High Sensitivity): 4718 ng/L (ref <18)
Troponin I (High Sensitivity): 5378 ng/L (ref <18)

## 2019-05-20 LAB — BRAIN NATRIURETIC PEPTIDE: B Natriuretic Peptide: 100.8 pg/mL — ABNORMAL HIGH (ref 0.0–100.0)

## 2019-05-20 LAB — HEPARIN LEVEL (UNFRACTIONATED): Heparin Unfractionated: 0.81 IU/mL — ABNORMAL HIGH (ref 0.30–0.70)

## 2019-05-20 SURGERY — LEFT HEART CATH AND CORONARY ANGIOGRAPHY
Anesthesia: LOCAL

## 2019-05-20 MED ORDER — VERAPAMIL HCL 2.5 MG/ML IV SOLN
INTRAVENOUS | Status: DC | PRN
  Administered 2019-05-20: 10:00:00 10 mL via INTRA_ARTERIAL

## 2019-05-20 MED ORDER — TICAGRELOR 90 MG PO TABS
90.0000 mg | ORAL_TABLET | Freq: Two times a day (BID) | ORAL | Status: DC
  Administered 2019-05-20 – 2019-05-21 (×3): 90 mg via ORAL
  Filled 2019-05-20 (×3): qty 1

## 2019-05-20 MED ORDER — ATORVASTATIN CALCIUM 80 MG PO TABS
80.0000 mg | ORAL_TABLET | Freq: Every day | ORAL | Status: DC
  Administered 2019-05-20: 80 mg via ORAL
  Filled 2019-05-20: qty 1

## 2019-05-20 MED ORDER — ASPIRIN 81 MG PO CHEW: 81.0000 mg | CHEWABLE_TABLET | ORAL | Status: DC

## 2019-05-20 MED ORDER — SODIUM CHLORIDE 0.9% FLUSH: 3.0000 mL | INTRAVENOUS | Status: DC | PRN

## 2019-05-20 MED ORDER — SODIUM CHLORIDE 0.9% FLUSH: 3.0000 mL | Freq: Two times a day (BID) | INTRAVENOUS | Status: DC

## 2019-05-20 MED ORDER — NITROGLYCERIN 0.4 MG SL SUBL: 0.4000 mg | SUBLINGUAL_TABLET | SUBLINGUAL | Status: DC | PRN

## 2019-05-20 MED ORDER — ASPIRIN 81 MG PO CHEW
324.0000 mg | CHEWABLE_TABLET | ORAL | Status: AC
  Administered 2019-05-20: 324 mg via ORAL
  Filled 2019-05-20: qty 4

## 2019-05-20 MED ORDER — HEPARIN (PORCINE) IN NACL 1000-0.9 UT/500ML-% IV SOLN
INTRAVENOUS | Status: DC | PRN
  Administered 2019-05-20 (×2): 500 mL

## 2019-05-20 MED ORDER — MIDAZOLAM HCL 2 MG/2ML IJ SOLN
INTRAMUSCULAR | Status: AC
  Filled 2019-05-20: qty 2

## 2019-05-20 MED ORDER — ASPIRIN EC 81 MG PO TBEC
81.0000 mg | DELAYED_RELEASE_TABLET | Freq: Every day | ORAL | Status: DC
  Administered 2019-05-21: 81 mg via ORAL
  Filled 2019-05-20: qty 1

## 2019-05-20 MED ORDER — SODIUM CHLORIDE 0.9% FLUSH
3.0000 mL | Freq: Two times a day (BID) | INTRAVENOUS | Status: DC
  Administered 2019-05-20: 22:00:00 3 mL via INTRAVENOUS

## 2019-05-20 MED ORDER — SODIUM CHLORIDE 0.9 % IV SOLN: 250.0000 mL | INTRAVENOUS | Status: DC | PRN

## 2019-05-20 MED ORDER — SODIUM CHLORIDE 0.9 % IV SOLN: INTRAVENOUS | Status: AC

## 2019-05-20 MED ORDER — ALBUTEROL SULFATE (2.5 MG/3ML) 0.083% IN NEBU: 2.5000 mg | INHALATION_SOLUTION | Freq: Four times a day (QID) | RESPIRATORY_TRACT | Status: DC | PRN

## 2019-05-20 MED ORDER — METOPROLOL SUCCINATE ER 25 MG PO TB24
12.5000 mg | ORAL_TABLET | Freq: Every day | ORAL | Status: DC
  Administered 2019-05-21: 08:00:00 12.5 mg via ORAL
  Filled 2019-05-20 (×2): qty 1

## 2019-05-20 MED ORDER — HEPARIN SODIUM (PORCINE) 5000 UNIT/ML IJ SOLN
5000.0000 [IU] | Freq: Three times a day (TID) | INTRAMUSCULAR | Status: DC
  Administered 2019-05-20 – 2019-05-21 (×2): 5000 [IU] via SUBCUTANEOUS
  Filled 2019-05-20 (×2): qty 1

## 2019-05-20 MED ORDER — ONDANSETRON HCL 4 MG/2ML IJ SOLN: 4.0000 mg | Freq: Four times a day (QID) | INTRAMUSCULAR | Status: DC | PRN

## 2019-05-20 MED ORDER — IOHEXOL 350 MG/ML SOLN
INTRAVENOUS | Status: DC | PRN
  Administered 2019-05-20: 65 mL

## 2019-05-20 MED ORDER — SODIUM CHLORIDE 0.9 % WEIGHT BASED INFUSION: 1.0000 mL/kg/h | INTRAVENOUS | Status: DC

## 2019-05-20 MED ORDER — HEPARIN (PORCINE) IN NACL 1000-0.9 UT/500ML-% IV SOLN
INTRAVENOUS | Status: AC
  Filled 2019-05-20: qty 1000

## 2019-05-20 MED ORDER — HEPARIN SODIUM (PORCINE) 1000 UNIT/ML IJ SOLN
INTRAMUSCULAR | Status: DC | PRN
  Administered 2019-05-20: 5000 [IU] via INTRAVENOUS

## 2019-05-20 MED ORDER — FENTANYL CITRATE (PF) 100 MCG/2ML IJ SOLN
INTRAMUSCULAR | Status: DC | PRN
  Administered 2019-05-20: 50 ug via INTRAVENOUS

## 2019-05-20 MED ORDER — LIDOCAINE HCL (PF) 1 % IJ SOLN
INTRAMUSCULAR | Status: DC | PRN
  Administered 2019-05-20: 2 mL

## 2019-05-20 MED ORDER — HYDRALAZINE HCL 20 MG/ML IJ SOLN: 10.0000 mg | INTRAMUSCULAR | Status: AC | PRN

## 2019-05-20 MED ORDER — FENTANYL CITRATE (PF) 100 MCG/2ML IJ SOLN
INTRAMUSCULAR | Status: AC
  Filled 2019-05-20: qty 2

## 2019-05-20 MED ORDER — MIDAZOLAM HCL 2 MG/2ML IJ SOLN
INTRAMUSCULAR | Status: DC | PRN
  Administered 2019-05-20: 1 mg via INTRAVENOUS

## 2019-05-20 MED ORDER — ASPIRIN 300 MG RE SUPP: 300.0000 mg | RECTAL | Status: AC

## 2019-05-20 MED ORDER — ACETAMINOPHEN 325 MG PO TABS: 650.0000 mg | ORAL_TABLET | ORAL | Status: DC | PRN

## 2019-05-20 MED ORDER — ASPIRIN 81 MG PO CHEW: 81.0000 mg | CHEWABLE_TABLET | Freq: Every day | ORAL | Status: DC

## 2019-05-20 MED ORDER — PREGABALIN 100 MG PO CAPS
200.0000 mg | ORAL_CAPSULE | Freq: Three times a day (TID) | ORAL | Status: DC
  Administered 2019-05-20 – 2019-05-21 (×4): 200 mg via ORAL
  Filled 2019-05-20 (×4): qty 2

## 2019-05-20 MED ORDER — PREDNISONE 20 MG PO TABS
20.0000 mg | ORAL_TABLET | Freq: Every day | ORAL | Status: DC
  Administered 2019-05-20 – 2019-05-21 (×2): 20 mg via ORAL
  Filled 2019-05-20 (×2): qty 1

## 2019-05-20 MED ORDER — LIDOCAINE HCL (PF) 1 % IJ SOLN
INTRAMUSCULAR | Status: AC
  Filled 2019-05-20: qty 30

## 2019-05-20 MED ORDER — LABETALOL HCL 5 MG/ML IV SOLN: 10.0000 mg | INTRAVENOUS | Status: AC | PRN

## 2019-05-20 MED ORDER — HEPARIN (PORCINE) 25000 UT/250ML-% IV SOLN
1750.0000 [IU]/h | INTRAVENOUS | Status: DC
  Administered 2019-05-20: 1950 [IU]/h via INTRAVENOUS
  Filled 2019-05-20: qty 250

## 2019-05-20 MED ORDER — HEPARIN SODIUM (PORCINE) 1000 UNIT/ML IJ SOLN
INTRAMUSCULAR | Status: AC
  Filled 2019-05-20: qty 1

## 2019-05-20 MED ORDER — VERAPAMIL HCL 2.5 MG/ML IV SOLN
INTRAVENOUS | Status: AC
  Filled 2019-05-20: qty 2

## 2019-05-20 MED ORDER — LISINOPRIL 5 MG PO TABS
5.0000 mg | ORAL_TABLET | Freq: Every day | ORAL | Status: DC
  Administered 2019-05-20: 5 mg via ORAL
  Filled 2019-05-20: qty 1

## 2019-05-20 MED ORDER — GADOBUTROL 1 MMOL/ML IV SOLN
12.0000 mL | Freq: Once | INTRAVENOUS | Status: AC | PRN
  Administered 2019-05-20: 12 mL via INTRAVENOUS

## 2019-05-20 MED ORDER — SODIUM CHLORIDE 0.9 % WEIGHT BASED INFUSION
3.0000 mL/kg/h | INTRAVENOUS | Status: AC
  Administered 2019-05-20: 06:00:00 3 mL/kg/h via INTRAVENOUS

## 2019-05-20 MED ORDER — NITROGLYCERIN IN D5W 200-5 MCG/ML-% IV SOLN
0.0000 ug/min | INTRAVENOUS | Status: DC
  Administered 2019-05-20 (×2): 10 ug/min via INTRAVENOUS
  Filled 2019-05-20 (×2): qty 250

## 2019-05-20 SURGICAL SUPPLY — 11 items
CATH INFINITI 5FR ANG PIGTAIL (CATHETERS) ×2 IMPLANT
CATH OPTITORQUE TIG 4.0 5F (CATHETERS) ×2 IMPLANT
DEVICE RAD COMP TR BAND LRG (VASCULAR PRODUCTS) ×2 IMPLANT
GLIDESHEATH SLEND SS 6F .021 (SHEATH) ×2 IMPLANT
GUIDEWIRE INQWIRE 1.5J.035X260 (WIRE) ×1 IMPLANT
INQWIRE 1.5J .035X260CM (WIRE) ×2
KIT HEART LEFT (KITS) ×2 IMPLANT
PACK CARDIAC CATHETERIZATION (CUSTOM PROCEDURE TRAY) ×2 IMPLANT
SHEATH PROBE COVER 6X72 (BAG) ×2 IMPLANT
TRANSDUCER W/STOPCOCK (MISCELLANEOUS) ×2 IMPLANT
TUBING CIL FLEX 10 FLL-RA (TUBING) ×2 IMPLANT

## 2019-05-20 NOTE — Progress Notes (Addendum)
ANTICOAGULATION CONSULT NOTE - Initial Consult  Pharmacy Consult for Heparin Indication: NSTEMI  Allergies  Allergen Reactions  . Methadone Other (See Comments)    REACTION: "Become mentally unstable"    Patient Measurements: Weight: 227 lb 3.2 oz (103.1 kg) Heparin Dosing Weight: 103 kg  Vital Signs: BP: 149/94 (02/09 0500) Pulse Rate: 50 (02/09 0500)  Labs: No results for input(s): HGB, HCT, PLT, APTT, LABPROT, INR, HEPARINUNFRC, HEPRLOWMOCWT, CREATININE, CKTOTAL, CKMB, TROPONINIHS in the last 72 hours.  CrCl cannot be calculated (Patient's most recent lab result is older than the maximum 21 days allowed.).   Medical History: Past Medical History:  Diagnosis Date  . Allergic rhinitis   . Arthritis   . Chronic low back pain    follwed in Pain Clinic(Dr.Kirchmayer)  . Depression   . Depression with anxiety   . GERD (gastroesophageal reflux disease)   . History of kidney stones    passed  . HLD (hyperlipidemia) 03/23/2019  . Insomnia   . Migraines   . Sarcoidosis    Multisystem,pulmonary and hepatic (Dr.Rourk)Liver bx in 06 mildly postive AMA but on re-check normal  . Sleep apnea     Medications:  Awaiting home med rec  Assessment: 59 y.o. M with NSTEMI (transferred from [facility]) Labs at [facility]: K 3.6, creatinine 1.5 (near baseline), TnT <0.01 => 0.14 => 0.36, WBCs 22, hgb 12.7, plts 310, COVID-19 negative, respiratory panel negative  Heparin 4000 unit bolus and gtt 1960 units/hr started at [facility] 2/8 ~1830.  Goal of Therapy:  Heparin level 0.3-0.7 units/ml Monitor platelets by anticoagulation protocol: Yes   Plan:  Heparin 1950 units/hr STAT heparin level  Sherlon Handing, PharmD, BCPS Please see amion for complete clinical pharmacist phone list 05/20/2019,5:06 AM   Addendum [phone]): Heparin level 0.81 (supratherapeutic)on gtt at 1950 units/hr.  Plan: Decrease gtt to 1750 units/hr Will f/u post cath  Sherlon Handing, PharmD, BCPS Please see  amion for complete clinical pharmacist phone list 05/20/2019 7:07 AM

## 2019-05-20 NOTE — Progress Notes (Addendum)
Progress Note  Patient Name: Michael [name] Date of Encounter: 05/20/2019  Primary Cardiologist: Carlyle Dolly, MD   Patient profile   59 y/o male with history of sarcoidosis was transferred from [facility] with chest pain/NSTEMI. He has a history of presumed embolic STEMI, he was admitted in 12/20 with chest pain/STE lateral and inferior leads with cath showing occlusion of distal LAD and distal LCx>The distal LAD reperfused spontaneously while in cath lab while the distal LCx underwent PTCA but was left with only TIMI 1 flow. initially treated with Aggrastat for presumed embolic MI rather than atherosclerotic plaque.  Echo showed EF 60-65%.  No clear source of embolus was found, no atrial fibrillation was noted. He went home on aspirin and ticagrelor and he has been wearing a heart monitor. He went to ED at [facility] again on the day of admission due to chest pain. after he smoked a cigarette. ECG unchanged from last EKG. He was sent to [facility] ED for suspected NSTEMI. (Trop was pending). (His Trop was elevated at [id number].)  Subjective   Patient was seen and evaluated at bedside on morning rounds. No acute events overnight. No complaint and no chest pain currently.   Inpatient Medications    Scheduled Meds:  [START ON 05/21/2019] aspirin EC  81 mg Oral Daily   atorvastatin  80 mg Oral q1800   lisinopril  5 mg Oral Daily   metoprolol succinate  12.5 mg Oral Daily   predniSONE  20 mg Oral Q breakfast   pregabalin  200 mg Oral TID   sodium chloride flush  3 mL Intravenous Q12H   ticagrelor  90 mg Oral BID   Continuous Infusions:  sodium chloride     sodium chloride 1 mL/kg/hr (05/20/19 0657)   heparin 1,750 Units/hr (05/20/19 0715)   nitroGLYCERIN 10 mcg/min (05/20/19 0541)   PRN Meds: sodium chloride, acetaminophen, albuterol, nitroGLYCERIN, ondansetron (ZOFRAN) IV, sodium chloride flush   Vital Signs    Vitals:   05/20/19 0600 05/20/19 0615 05/20/19 0630 05/20/19 0645    BP: 114/81 124/83 121/83 115/77  Pulse: (!) 53 (!) 53 (!) 53 (!) 55  Resp: 15 10 14 11   SpO2: 96% 97% 93% 97%  Weight:       No intake or output data in the 24 hours ending 05/20/19 0731 Last 3 Weights 05/20/2019 04/17/2019 03/23/2019  Weight (lbs) 227 lb 3.2 oz 228 lb 9.6 oz 213 lb 9.6 oz  Weight (kg) 103.057 kg 103.692 kg 96.888 kg      Telemetry    bordeline bradycardia, PVC - Personally Reviewed  ECG    T inversion in Inferior leads - Personally Reviewed  Physical Exam   GEN: No acute distress.   Neck: No JVD Cardiac: RRR, no murmurs, rubs, or gallops.  Respiratory: Clear to auscultation bilaterally. GI: Soft, nontender, non-distended  MS: No edema; No deformity. Neuro:  Nonfocal  Psych: Normal affect   Labs    High Sensitivity Troponin:   Recent Labs  Lab 05/20/19 0537  TROPONINIHS 4,718*      Chemistry Recent Labs  Lab 05/20/19 0537  NA 142  K 3.8  CL 108  CO2 24  GLUCOSE 128*  BUN 14  CREATININE 1.39*  CALCIUM 8.8*  PROT 6.2*  ALBUMIN 3.1*  AST 55*  ALT 20  ALKPHOS 72  BILITOT 0.4  GFRNONAA 56*  GFRAA >60  ANIONGAP 10     Hematology Recent Labs  Lab 05/20/19 0537  WBC 16.8*  RBC 3.95*  HGB 12.0*  HCT 36.6*  MCV 92.7  MCH 30.4  MCHC 32.8  RDW 16.1*  PLT 283    BNP Recent Labs  Lab 05/20/19 0537  BNP 100.8*     DDimer No results for input(s): DDIMER in the last 168 hours.   Radiology    No results found.  Cardiac Studies   There is moderate to severe left ventricular systolic dysfunction with regional and global hypokinesis. Ejection Fraction (EF) appears to be 25-35% by visual estimate. LV end diastolic pressure is mildly elevated. The previously occluded 1st Mrg treated with PTCA is widely patent. Previously occluded Dist LAD lesionis now widely patent Ost RCA lesion is 20% stenosed.   SUMMARY Previously occluded distal circumflex is widely patent as is the LAD.  Mild ostial RCA but otherwise normal, tortuous  vessel. Dilated Left Ventricle with global hypokinesis worse in the inferior and anterior apex.  EF estimated 30 of 35%. Suspect NONISCHEMIC CARDIOMYOPATHY     RECOMMENDATIONS Recommend checking 2D echocardiogram for better assessment of EF and wall motion. Evaluate for causes of Nonischemic Cardiomyopathy with elevated troponin -> consider myocarditis. Further plans per primary service -> would potentially consider cardiac MRI with concern for myocarditis.     Glenetta Hew, MD  Diagnostic Dominance: Right    Patient Profile     59 y.o. male with history of sarcoidosis was transferred from [facility] with chest pain/NSTEMI. He has a history of presumed embolic STEMI, he was admitted in 12/20 with chest pain/STE lateral and inferior leads with cath showing occlusion of distal LAD and distal LCx>The distal LAD reperfused spontaneously while in cath lab while the distal LCx underwent PTCA but was left with only TIMI 1 flow. initially treated with Aggrastat for presumed embolic MI rather than atherosclerotic plaque.  Echo showed EF 60-65%.  No clear source of embolus was found, no atrial fibrillation was noted. He went home on aspirin and ticagrelor and he has been wearing a heart monitor. He went to ED at [facility] again on the day of admission due to chest pain. after he smoked a cigarette. ECG unchanged from last EKG. He was sent to [facility] ED for suspected NSTEMI.  Assessment & Plan    CAD Cardiomyopathy: Recent hospitalization for inferolateral STEMI presumed to be embolic but no source found. He discharged home and went to ED again for chest pain where did not have new EKG changes but Trop was elevated and he transfered to [facility] hospital for NSTEMI after starting on IV Hep and NG drip.  Cardiac cath performed today. Prior occluded LAD and Circumflex that was seen on prior cath are now open. EF is globally low suggestive of CMP. BNP 100 TSH normal. Will evaluate infiltrative vs non  infiltrative CMP. Cardiac Sarcoidosis is top on the list of differentials and if proven, may need steroids. If rules out and if shown to be non infiltrative, will optimize supportive cardioprotective agent for CAD and asymptomatic HFrEF.   Of note his drug screen was positive for opioid. He denies recreational opioid use. Could be pain management he received for STEMI.  -Ordering cardiac MRI -Stable to transfer to cardiac tele - Continue ASA 81, ticagrelor.  - Continue atorvastatin 80 daily - Continue Toprol XL and lisinopril.  - I think that he is going to merit repeat coronary angiography as he appears to have a new event.  Discussed risks/benefits with patient, he agrees to procedure.  -Encouraged to quit smoking  HTN:  BP at 129/80 now Continue Toprol XL and lisinopril.       Sarcoidosis: On prednisone at home. His probable current finding of cardiomyopathy can be in setting of sarcoidosis. Doing further work up WBCs elevated, likely to prednisone  - Continue home prednisone for now.   CKD stage 3: Creatinine 1.5 at baseline. Hydrated prior to cath.  -BMP daily  For questions or updates, please contact [place] Please consult [email or web address] for contact info under        Signed, Dewayne Hatch, MD  05/20/2019, 7:31 AM     Agree with note by Dr Myrtie Hawk  59 year old African-American male with history of sarcoidosis and prior cardiac catheterization for occluded distal circumflex OM and LAD with POBA several years ago.  It was not thought that this was atherosclerotic in nature.  His EF at that time was normal.  He was admitted with substernal chest pain and had positive enzymes.  He went to cardiac cath today revealing a widely patent circumflex obtuse marginal branch distally which was previously intervened on an apical LAD.  However his EF was in the 30 to 35% range which was a new finding.  He denies symptoms of heart failure.  The question was what was the etiology  and possibility was raised that this was related to sarcoidosis.  We will plan on performing a cardiac MRI to further evaluate whether this was an infiltrative process and otherwise begin guideline directed optimal medical therapy for LV dysfunction.  Lorretta Harp, M.D., [place], [facility], Laverta [place] [address] [place], [place]  [address]  [phone] 05/20/2019 2:45 PM

## 2019-05-20 NOTE — H&P (Addendum)
PCP:  Wyatt Haste, NP  PCP-Cardiology: Carlyle Dolly, MD     Reason for Admission: NSTEMI   HPI:    59 y.o. with history of sarcoidosis was transferred from [facility] with chest pain/NSTEMI. He has a history of presumed embolic STEMI, he was admitted in 12/20 with chest pain/STE lateral and inferior leads with cath showing occlusion of distal LAD and distal LCx.  The distal LAD reperfused spontaneously while in cath lab while the distal LCx underwent PTCA but was left with only TIMI 1 flow. Peak HS TnI 4664.  He was treated initially with Aggrastat for presumed embolic MI rather than atherosclerotic plaque.  Echo showed EF 60-65%.  No clear source of embolus was found, no atrial fibrillation was noted. He went home on aspirin and ticagrelor and he has been wearing a heart monitor.   Yesterday, he went to the ER at [facility] with constant sharp mid-chest pain. He had walked a little bit, talked with some friends, and smoked a cigarette.  He was playing a poker machine then started to have severe burning central chest pain radiating to neck at 5 pm.  He went to the ER, pain improved with NTG and MSO4, but still with 1-2/10 CP.  He has NTG gtt and heparin gtt running. Prior to today, he had rare mild atypical chest pain.  Says he was taking all his meds.   CXR: clear  ECG showed inferior Qs and 2 mm STE.  This looked similar to 12/20 ECG at [facility] but without lateral changes.   Labs: K 3.6, creatinine 1.5 (near baseline), TnT <0.01 => 0.14 => 0.36, WBCs 22, hgb 12.7, plts 310, COVID-19 negative, respiratory panel negative   Review of Systems: All systems reviewed and negative except as per HPI.   Home Medications Prior to Admission medications   Medication Sig Start Date End Date Taking? Authorizing Provider  acetaminophen (TYLENOL) 325 MG tablet Take 2 tablets (650 mg total) by mouth every 4 (four) hours as needed for headache or mild pain. 03/23/19   Isaiah Serge, NP    albuterol (PROVENTIL) (2.5 MG/3ML) 0.083% nebulizer solution Take 2.5 mg by nebulization every 6 (six) hours as needed for wheezing or shortness of breath.     [provider]  aspirin 81 MG chewable tablet Chew 1 tablet (81 mg total) by mouth daily. 03/24/19   Isaiah Serge, NP  atorvastatin (LIPITOR) 80 MG tablet TAKE ONE TABLET BY MOUTH DAILY AT 6 PM. 04/29/19   Branch, Alphonse Guild, MD  lisinopril (ZESTRIL) 5 MG tablet Take 1 tablet (5 mg total) by mouth daily. 03/24/19   Isaiah Serge, NP  metoprolol succinate (TOPROL-XL) 25 MG 24 hr tablet Take 0.5 tablets (12.5 mg total) by mouth daily. 03/24/19   Isaiah Serge, NP  nitroGLYCERIN (NITROSTAT) 0.4 MG SL tablet Place 1 tablet (0.4 mg total) under the tongue every 5 (five) minutes x 3 doses as needed for chest pain. 03/23/19   Isaiah Serge, NP  predniSONE (DELTASONE) 20 MG tablet Take 1 tablet (20 mg total) by mouth daily with breakfast. 03/23/19   Isaiah Serge, NP  pregabalin (LYRICA) 200 MG capsule Take 200 mg by mouth 3 (three) times daily.     [provider]  tetrahydrozoline-zinc (VISINE-AC) 0.05-0.25 % ophthalmic solution Place 2 drops into both eyes 3 (three) times daily as needed (dry/red eyes).    [provider]  ticagrelor (BRILINTA) 90 MG TABS  tablet Take 1 tablet (90 mg total) by mouth 2 (two) times daily. 03/23/19   Isaiah Serge, NP    Past Medical History: Past Medical History:  Diagnosis Date  . Allergic rhinitis   . Arthritis   . Chronic low back pain    follwed in Pain Clinic(Dr.Kirchmayer)  . Depression   . Depression with anxiety   . GERD (gastroesophageal reflux disease)   . History of kidney stones    passed  . HLD (hyperlipidemia) 03/23/2019  . Insomnia   . Migraines   . Sarcoidosis    Multisystem,pulmonary and hepatic (Dr.Rourk)Liver bx in 06 mildly postive AMA but on re-check normal  . Sleep apnea     Past Surgical History: Past Surgical History:  Procedure  Laterality Date  . BACK SURGERY  2012   2nd back surgery 3 monthes ago and in 2007  . COLONOSCOPY  11/05/2003   [id number] internal hemorrhoids, otherwise normal rectum and colon  . CORONARY ARTERY BYPASS GRAFT  2017  . CORONARY/GRAFT ACUTE MI REVASCULARIZATION N/A 03/20/2019   Procedure: Coronary/Graft Acute MI Revascularization;  Surgeon: Troy Sine, MD;  Location: [place] [facility];  Service: Cardiovascular;  Laterality: N/A;  . ESOPHAGOGASTRODUODENOSCOPY  03/22/2004   [id number] esophagogastroduodenoscopy. Small hiatal hernia otherwise normal stomach  . HARVEST BONE GRAFT Left    thigh  . HIP ARTHROPLASTY Bilateral    left 2017, right 2016  . HIP SURGERY Left 2017   bone graft  . LAMINECTOMY  2009   Balateral L4-L5  . LEFT HEART CATH AND CORONARY ANGIOGRAPHY N/A 03/20/2019   Procedure: LEFT HEART CATH AND CORONARY ANGIOGRAPHY;  Surgeon: Troy Sine, MD;  Location: [place] [facility];  Service: Cardiovascular;  Laterality: N/A;  . RESECTION DISTAL CLAVICAL Right 01/23/2019   Procedure: OPEN DISTAL CLAVICLE EXCISION;  Surgeon: Carole Civil, MD;  Location: [facility];  Service: Orthopedics;  Laterality: Right;  . TOOTH EXTRACTION Bilateral 12/28/2017   Procedure: DENTAL RESTORATION/EXTRACTIONS;  Surgeon: Diona Browner, DDS;  Location: [place];  Service: Oral Surgery;  Laterality: Bilateral;    Family History:  Family History  Problem Relation Age of Onset  . Colon cancer Brother        in his 59s    Social History: Social History   Socioeconomic History  . Marital status: Legally Separated    Spouse name: Not on file  . Number of children: Not on file  . Years of education: Not on file  . Highest education level: Not on file  Occupational History  . Not on file  Tobacco Use  . Smoking status: Current Every Day Smoker    Packs/day: 0.50    Years: 29.00    Pack years: 14.50    Types: Cigarettes  . Smokeless tobacco: Never Used  Substance and Sexual  Activity  . Alcohol use: Yes    Comment: ocassional  . Drug use: No  . Sexual activity: Not on file  Other Topics Concern  . Not on file  Social History Narrative  . Not on file   Social Determinants of Health   Financial Resource Strain:   . Difficulty of Paying Living Expenses: Not on file  Food Insecurity:   . Worried About Charity fundraiser in the Last Year: Not on file  . Ran Out of Food in the Last Year: Not on file  Transportation Needs:   . Lack of Transportation (Medical): Not on file  . Lack of Transportation (Non-Medical): Not  on file  Physical Activity:   . Days of Exercise per Week: Not on file  . Minutes of Exercise per Session: Not on file  Stress:   . Feeling of Stress : Not on file  Social Connections:   . Frequency of Communication with Friends and Family: Not on file  . Frequency of Social Gatherings with Friends and Family: Not on file  . Attends Religious Services: Not on file  . Active Member of Clubs or Organizations: Not on file  . Attends Archivist Meetings: Not on file  . Marital Status: Not on file    Allergies:  Allergies  Allergen Reactions  . Methadone Other (See Comments)    REACTION: "Become mentally unstable"    Objective:    Vital Signs:       There were no vitals filed for this visit.   Physical Exam     General:  Well appearing. No respiratory difficulty HEENT: Normal Neck: Supple. no JVD. Carotids 2+ bilat; no bruits. No lymphadenopathy or thyromegaly appreciated. Cor: PMI nondisplaced. Regular rate & rhythm. No rubs, gallops or murmurs. Lungs: Clear Abdomen: Soft, nontender, nondistended. No hepatosplenomegaly. No bruits or masses. Good bowel sounds. Extremities: No cyanosis, clubbing, rash, edema Neuro: Alert & oriented x 3, cranial nerves grossly intact. moves all 4 extremities w/o difficulty. Affect pleasant.   Telemetry   NSR 60s, personally reviewed  EKG   NSR, inferior Qs and 2 mm STE.  This  looked similar to 12/20 ECG at [facility] but without lateral changes. Personally reviewed  Labs     Basic Metabolic Panel: No results for input(s): NA, K, CL, CO2, GLUCOSE, BUN, CREATININE, CALCIUM, MG, PHOS in the last 168 hours.  Liver Function Tests: No results for input(s): AST, ALT, ALKPHOS, BILITOT, PROT, ALBUMIN in the last 168 hours. No results for input(s): LIPASE, AMYLASE in the last 168 hours. No results for input(s): AMMONIA in the last 168 hours.  CBC: No results for input(s): WBC, NEUTROABS, HGB, HCT, MCV, PLT in the last 168 hours.  Cardiac Enzymes: No results for input(s): CKTOTAL, CKMB, CKMBINDEX, TROPONINI in the last 168 hours.  BNP: BNP (last 3 results) Recent Labs    03/20/19 2344  BNP 34.8    ProBNP (last 3 results) No results for input(s): PROBNP in the last 8760 hours.   CBG: No results for input(s): GLUCAP in the last 168 hours.  Coagulation Studies: No results for input(s): LABPROT, INR in the last 72 hours.  Imaging:  No results found.   Assessment/Plan   1. CAD: Patient had inferolateral STEMI in 12/20, cath showed occluded distal LAD and distal LCx, suspected embolic MI.  The LAD spontaneously recanalized, attempted PTCA distal LCx but unsuccessful. No source of embolization was found, echo showed EF 60-65%.  No atrial fibrillation was noted.  He is currently wearing a cardiac monitor.  He has had recurrent severe CP last night, persisting for hours (now mild).  Mild elevation in TnT from [facility].  The ECG showed inferior Qs with STE but looks similar to inferior leads on 12/20 ECG.  Suspected NSTEMI, transferred to [facility].   - Continue ASA 81, ticagrelor.  - heparin gtt - Continue atorvastatin 80 daily - Continue Toprol XL and lisinopril.  - Check troponin here.  - Titrate up NTG gtt to get him pain-free.  - Send urine drug screen.  - I think that he is going to merit repeat coronary angiography as he appears to have a new  event.   Discussed risks/benefits with patient, he agrees to procedure.  2. HTN: NTG gtt for chest pain, may titrate up.  Continue Toprol XL and lisinopril.     3. Smoking: Encouraged him to quit.  4. H/o sarcoidosis: CXR clear at outside hospital. He has been on prednisone long-term per his report, currently taking prednisone 20 mg daily (managed by PCP).  WBCs elevated, likely due in part to prednisone use.  - Continue home prednisone for now.  5. CKD stage 3: Creatinine 1.5, appears to be his baseline.  - Hydrate prior to cath.   Loralie Champagne, MD 05/20/2019, 4:08 AM  Advanced Heart Failure Team Pager [phone] (M-F; 7a - 4p)  Please contact [facility] for night-coverage after hours (4p -7a ) and weekends on [email or web address]

## 2019-05-20 NOTE — Progress Notes (Signed)
[employer] called re: 13 bt run VT  Rosaria Ferries, PA-C 05/20/2019 5:52 PM

## 2019-05-20 NOTE — Interval H&P Note (Signed)
History and Physical Interval Note:  05/20/2019 10:15 AM  Michael [name]  has presented today for surgery, with the diagnosis of NON-ST ELEVATION MI.  The various methods of treatment have been discussed with the patient and family. After consideration of risks, benefits and other options for treatment, the patient has consented to  Procedure(s): LEFT HEART CATH AND CORONARY ANGIOGRAPHY (N/A)  PERCUTANEOUS CORONARY INTERVENTION   as a surgical intervention.  The patient's history has been reviewed, patient examined, no change in status, stable for surgery.  I have reviewed the patient's chart and labs.  Questions were answered to the patient's satisfaction.    Cath Lab Visit (complete for each Cath Lab visit)  Clinical Evaluation Leading to the Procedure:   ACS: Yes.    Non-ACS:    Anginal Classification: CCS IV  Anti-ischemic medical therapy: Minimal Therapy (1 class of medications)  Non-Invasive Test Results: No non-invasive testing performed  Prior CABG: No previous CABG   Glenetta Hew

## 2019-05-20 NOTE — Progress Notes (Signed)
Spoke with Roby Lofts PA about outpatient heart monitor and scheduled cardiac MRI.  She stated to remove the outpatient cardiac monitor.

## 2019-05-21 LAB — BASIC METABOLIC PANEL
Anion gap: 8 (ref 5–15)
BUN: 17 mg/dL (ref 6–20)
CO2: 23 mmol/L (ref 22–32)
Calcium: 8.9 mg/dL (ref 8.9–10.3)
Chloride: 109 mmol/L (ref 98–111)
Creatinine, Ser: 1.36 mg/dL — ABNORMAL HIGH (ref 0.61–1.24)
GFR calc Af Amer: >60 mL/min (ref >60)
GFR calc non Af Amer: 58 mL/min — ABNORMAL LOW (ref >60)
Glucose, Bld: 156 mg/dL — ABNORMAL HIGH (ref 70–99)
Potassium: 4.6 mmol/L (ref 3.5–5.1)
Sodium: 140 mmol/L (ref 135–145)

## 2019-05-21 LAB — MAGNESIUM: Magnesium: 2.1 mg/dL (ref 1.7–2.4)

## 2019-05-21 MED ORDER — CARVEDILOL 3.125 MG PO TABS: 3.1250 mg | ORAL_TABLET | Freq: Two times a day (BID) | ORAL | Status: DC

## 2019-05-21 NOTE — Telephone Encounter (Signed)
Left multiple messages for pt to return call - pt currently admitted - will forward to Dr Francine Graven

## 2019-05-21 NOTE — Discharge Summary (Addendum)
Discharge Summary    Patient ID: Michael [name] MRN: [id number]; DOB: 1960-09-12  Admit date: 05/20/2019 Discharge date: 05/21/2019  Primary Care Provider: Wyatt Haste, NP  Primary Cardiologist: Carlyle Dolly, MD  Primary Electrophysiologist:  None   Discharge Diagnoses    Active Problems:   NSTEMI (non-ST elevated myocardial infarction) [facility]) Non infiltrative cardiomyopathy  Diagnostic Studies/Procedures     Cardiac MRI: 05/20/2019 IMPRESSION: 1. Normal left ventricular size with EF 56%, severe hypokinesis to akinesis of the mid inferolateral wall extending into a portion of the mid inferior wall.   2.  Normal RV size and systolic function, EF [id number].   3. Delayed enhancement pattern in the inferolateral wall matches the hypokinesis on cine images and looks like a coronary disease pattern (prior MI).     Heart Cath:  05/20/2019 There is moderate to severe left ventricular systolic dysfunction with regional and global hypokinesis. Ejection Fraction (EF) appears to be 25-35% by visual estimate. LV end diastolic pressure is mildly elevated. The previously occluded 1st Mrg treated with PTCA is widely patent. Previously occluded Dist LAD lesionis now widely patent Ost RCA lesion is 20% stenosed.   SUMMARY Previously occluded distal circumflex is widely patent as is the LAD.  Mild ostial RCA but otherwise normal, tortuous vessel. Dilated Left Ventricle with global hypokinesis worse in the inferior and anterior apex.  EF estimated 30 of 35%. Suspect NONISCHEMIC CARDIOMYOPATHY     RECOMMENDATIONS Recommend checking 2D echocardiogram for better assessment of EF and wall motion. Evaluate for causes of Nonischemic Cardiomyopathy with elevated troponin -> consider myocarditis. Further plans per primary service -> would potentially consider cardiac MRI with concern for myocarditis.     Michael Hew, MD   Diagnostic Dominance: Right        History of Present Illness       Michael [name] is a 59 y.o. male with   Hospital Course  Non ischemic transient cardiomyopathy/non infiltrative:   59 y/o african [place] male with history of sarcoidosis and HTN, and recent cardiac catheterization for occluded distal circumflex OM and LAD, (It was not thought that this was atherosclerotic in nature). His EF at that time was normal. He presented with substernal chest pain. EKG unchanged, but Troponin was elevated. He underwent cardiac cath that showed a widely patent circumflex obtuse marginal branch distally which was previously intervened on an apical LAD. However he had new global LV dysfunction with EF 30 to 35%, suggestive of non ischemic CMP. CMP 2/2 sarcoidosis was on differentials but it ruled out as Cardiac MRI did not showed evidence of infiltrated cardiomyopathy and also revealed improvement of LV function. Patient received optimal medical treatment for CAD and asymptomatic heart failure. Patient remained asymptomatic (No c.p and no sign or symptoms of heart failure.) He is discharge home with  guideline directed optimal medical therapy for LV dysfunction: BB, ACE inhibitor, as well as medical treatment for CAD.      Sarcoidosis: Patient has been on prednisone prior to this hospitalization. No evidence of cardiac sarcoidosis on cardiac MRI. Will continue home prednisone after discharge and follow up with PCP.  Did the patient have an acute coronary syndrome (MI, NSTEMI, STEMI, etc) this admission?:  Yes                               AHA/ACC Clinical Performance & Quality Measures: Aspirin prescribed? - Yes ADP Receptor Inhibitor (Plavix/Clopidogrel, Brilinta/Ticagrelor or Effient/Prasugrel)  prescribed (includes medically managed patients)? - Yes Beta Blocker prescribed? - Yes High Intensity Statin (Lipitor 40-80mg  or Crestor 20-40mg ) prescribed? - Yes EF assessed during THIS hospitalization? - Yes For EF <40%, was ACEI/ARB prescribed? - Yes For EF <40%,  Aldosterone Antagonist (Spironolactone or Eplerenone) prescribed? - Not Applicable (EF >/= [id number]) Cardiac Rehab Phase II ordered (Included Medically managed Patients)? - No - n/a   _____________  Discharge Vitals Blood pressure 116/77, pulse 64, temperature 98.3 F (36.8 C), temperature source Oral, resp. rate 15, height 6\' 2"  (1.88 m), weight 103.7 kg, SpO2 98 %.  Filed Weights   05/20/19 0428 05/20/19 0812 05/21/19 0538  Weight: 103.1 kg 103.1 kg 103.7 kg    Labs & Radiologic Studies    CBC Recent Labs    05/20/19 0537  WBC 16.8*  NEUTROABS 9.0*  HGB 12.0*  HCT 36.6*  MCV 92.7  PLT [id number]   Basic Metabolic Panel Recent Labs    05/20/19 0537 05/21/19 1257  NA 142 140  K 3.8 4.6  CL 108 109  CO2 24 23  GLUCOSE 128* 156*  BUN 14 17  CREATININE 1.39* 1.36*  CALCIUM 8.8* 8.9  MG  --  2.1   Liver Function Tests Recent Labs    05/20/19 0537  AST 55*  ALT 20  ALKPHOS 72  BILITOT 0.4  PROT 6.2*  ALBUMIN 3.1*   No results for input(s): LIPASE, AMYLASE in the last 72 hours. High Sensitivity Troponin:   Recent Labs  Lab 05/20/19 0537 05/20/19 0907  TROPONINIHS 4,718* 5,378*    BNP Invalid input(s): POCBNP D-Dimer No results for input(s): DDIMER in the last 72 hours. Hemoglobin A1C No results for input(s): HGBA1C in the last 72 hours. Fasting Lipid Panel Recent Labs    05/20/19 0537  CHOL 221*  HDL 47  LDLCALC 147*  TRIG 135  CHOLHDL 4.7   Thyroid Function Tests Recent Labs    05/20/19 0907  TSH 2.700   _____________  CARDIAC CATHETERIZATION  Result Date: 05/20/2019  There is moderate to severe left ventricular systolic dysfunction with regional and global hypokinesis. Ejection Fraction (EF) appears to be 25-35% by visual estimate.  LV end diastolic pressure is mildly elevated.  The previously occluded 1st Mrg treated with PTCA is widely patent.  Previously occluded Dist LAD lesionis now widely patent  Ost RCA lesion is 20% stenosed.  SUMMARY   Previously occluded distal circumflex is widely patent as is the LAD.  Mild ostial RCA but otherwise normal, tortuous vessel.  Dilated Left Ventricle with global hypokinesis worse in the inferior and anterior apex.  EF estimated 30 of 35%.  Suspect NONISCHEMIC CARDIOMYOPATHY RECOMMENDATIONS  Recommend checking 2D echocardiogram for better assessment of EF and wall motion.  Evaluate for causes of Nonischemic Cardiomyopathy with elevated troponin -> consider myocarditis.  Further plans per primary service -> would potentially consider cardiac MRI with concern for myocarditis. Michael Hew, MD  MR CARDIAC MORPHOLOGY W WO CONTRAST  Result Date: 05/21/2019 CLINICAL DATA:  NSTEMI, no significant coronary disease. EXAM: CARDIAC MRI TECHNIQUE: The patient was scanned on a 1.5 Tesla GE magnet. A dedicated cardiac coil was used. Functional imaging was done using Fiesta sequences. 2,3, and 4 chamber views were done to assess for RWMA's. Modified Simpson's rule using a short axis stack was used to calculate an ejection fraction on a dedicated work Conservation officer, nature. The patient received 12 cc of Gadavist. After 10 minutes inversion recovery sequences were used to assess for  infiltration and scar tissue. CONTRAST:  Gadavist 12 cc FINDINGS: Limited images of the lung fields showed no gross abnormalities. Normal left ventricle size and systolic function, EF 0000000. Normal wall thickness. There is severe hypokinesis to akinesis in the mid inferolateral wall extending into the mid inferior wall. Normal right ventricular size and systolic function, EF [id number]. Mild left atrial enlargement. Normal right atrial size. Trileaflet aortic valve, no significant regurgitation or stenosis. No significant mitral regurgitation noted. On delayed enhancement imaging, there was subendocardial 76-99% wall thickness late gadolinium enhancement (LGE) in the mid inferolateral wall and a portion of the mid inferior wall. Measurements:  LVEDV 180 mL LVSV 102 mL LVEF 56% RVEDV 157 mL RVSV 80 mL RVEF 51% IMPRESSION: 1. Normal left ventricular size with EF 56%, severe hypokinesis to akinesis of the mid inferolateral wall extending into a portion of the mid inferior wall. 2.  Normal RV size and systolic function, EF [id number]. 3. Delayed enhancement pattern in the inferolateral wall matches the hypokinesis on cine images and looks like a coronary disease pattern (prior MI). Dail Mclean Electronically Signed   By: Loralie Champagne M.D.   On: 05/21/2019 12:38   Disposition   Pt is being discharged home today in good condition.  Follow-up Plans & Appointments    Follow-up Information     Arnoldo Lenis, MD Follow up on 06/06/2019.   Specialty: Cardiology Why: at 9:20am for your follow up appt Contact information: [address] [place] [address] [phone]           Discharge Instructions     Call MD for:   Complete by: As directed    Call MD for:  persistant nausea and vomiting   Complete by: As directed    Call MD for:  severe uncontrolled pain   Complete by: As directed    Diet - low sodium heart healthy   Complete by: As directed    Discharge instructions   Complete by: As directed    Thank you for allowing [place] providing your care at [facility]. We are glad that you feel better. Please take your medications as instructed before. Make sure to follow up with your cardiologist in 2-3 weeks. Please call us if you have any questions or concern.  Radial Site Care Refer to this sheet in the next few weeks. These instructions provide you with information on caring for yourself after your procedure. Your caregiver may also give you more specific instructions. Your treatment has been planned according to current medical practices, but problems sometimes occur. Call your caregiver if you have any problems or questions after your procedure. HOME CARE INSTRUCTIONS You may shower the day after the procedure.  Remove the bandage (dressing) and gently wash the site with plain soap and water. Gently pat the site dry.  Do not apply powder or lotion to the site.  Do not submerge the affected site in water for 3 to 5 days.  Inspect the site at least twice daily.  Do not flex or bend the affected arm for 24 hours.  No lifting over 5 pounds (2.3 kg) for 5 days after your procedure.  Do not drive home if you are discharged the same day of the procedure. Have someone else drive you.  You may drive 24 hours after the procedure unless otherwise instructed by your caregiver.  What to expect: Any bruising will usually fade within 1 to 2 weeks.  Blood that collects in the tissue (hematoma)  may be painful to the touch. It should usually decrease in size and tenderness within 1 to 2 weeks.  SEEK IMMEDIATE MEDICAL CARE IF: You have unusual pain at the radial site.  You have redness, warmth, swelling, or pain at the radial site.  You have drainage (other than a small amount of blood on the dressing).  You have chills.  You have a fever or persistent symptoms for more than 72 hours.  You have a fever and your symptoms suddenly get worse.  Your arm becomes pale, cool, tingly, or numb.  You have heavy bleeding from the site. Hold pressure on the site.   Increase activity slowly   Complete by: As directed        Discharge Medications   Allergies as of 05/21/2019       Reactions   Methadone Other (See Comments)   REACTION: "Become mentally unstable"        Medication List     STOP taking these medications    oxyCODONE 5 MG immediate release tablet Commonly known as: Oxy IR/ROXICODONE       TAKE these medications    acetaminophen 325 MG tablet Commonly known as: TYLENOL Take 2 tablets (650 mg total) by mouth every 4 (four) hours as needed for headache or mild pain.   albuterol (2.5 MG/3ML) 0.083% nebulizer solution Commonly known as: PROVENTIL Take 2.5 mg by nebulization every 6 (six) hours  as needed for wheezing or shortness of breath.   aspirin 81 MG chewable tablet Chew 1 tablet (81 mg total) by mouth daily.   atorvastatin 80 MG tablet Commonly known as: LIPITOR TAKE ONE TABLET BY MOUTH DAILY AT 6 PM. What changed: See the new instructions.   lisinopril 5 MG tablet Commonly known as: ZESTRIL Take 1 tablet (5 mg total) by mouth daily.   metoprolol succinate 25 MG 24 hr tablet Commonly known as: TOPROL-XL Take 0.5 tablets (12.5 mg total) by mouth daily.   nitroGLYCERIN 0.4 MG SL tablet Commonly known as: NITROSTAT Place 1 tablet (0.4 mg total) under the tongue every 5 (five) minutes x 3 doses as needed for chest pain.   predniSONE 20 MG tablet Commonly known as: DELTASONE Take 1 tablet (20 mg total) by mouth daily with breakfast.   pregabalin 200 MG capsule Commonly known as: LYRICA Take 200 mg by mouth 3 (three) times daily.   ticagrelor 90 MG Tabs tablet Commonly known as: BRILINTA Take 1 tablet (90 mg total) by mouth 2 (two) times daily.   zolpidem 5 MG tablet Commonly known as: AMBIEN Take 5 mg by mouth at bedtime.           Outstanding Labs/Studies   Component     Latest Ref Rng & Units 05/20/2019 05/20/2019         5:37 AM  9:07 AM  Troponin I (High Sensitivity)     <18 ng/L 4,718 (HH) 5,378 (HH)   Component     Latest Ref Rng & Units 05/20/2019  TSH     0.350 - 4.500 uIU/mL 2.700   Component     Latest Ref Rng & Units 05/20/2019          Cholesterol     0 - 200 mg/dL 221 (H)  Triglycerides     <150 mg/dL 135  HDL Cholesterol     >40 mg/dL 47  Total CHOL/HDL Ratio     RATIO 4.7  VLDL     0 - 40 mg/dL 27  LDL (calc)     0 - 99 mg/dL 147 (H)   Component     Latest Ref Rng & Units 05/20/2019  B Natriuretic Peptide     0.0 - 100.0 pg/mL 100.8 (H)   BMP Latest Ref Rng & Units 05/21/2019 05/20/2019 03/22/2019  Glucose 70 - 99 mg/dL 156(H) 128(H) 98  BUN 6 - 20 mg/dL 17 14 15   Creatinine 0.61 - 1.24 mg/dL 1.36(H) 1.39(H) 1.43(H)    BUN/Creat Ratio 6 - 22 (calc) - - -  Sodium 135 - 145 mmol/L 140 142 140  Potassium 3.5 - 5.1 mmol/L 4.6 3.8 4.3  Chloride 98 - 111 mmol/L 109 108 108  CO2 22 - 32 mmol/L 23 24 23   Calcium 8.9 - 10.3 mg/dL 8.9 8.8(L) 8.8(L)    Duration of Discharge Encounter   Greater than 30 minutes including physician time.  Signed, Dewayne Hatch, MD 05/21/2019, 4:22 PM  Agree with note by [facility]  Mr. Colucci was admitted with chest pain.  He underwent cardiac catheterization revealing patent coronaries.  In particular the distal apical LAD and the distal obtuse marginal branch were widely patent.  His ejection fraction was moderately decreased by echo in the setting of sarcoidosis consult there was concern about an infiltrative process contributing to her cardiomyopathy.  A cardiac MRI was performed that showed inferior scar from the previously occluded distal circumflex obtuse marginal branch but no evidence of an infiltrative cardiomyopathy with an EF of 56%.  Patient has been pain-free since admission.  Based on this he is deemed stable for discharge home.  Lorretta Harp, M.D., [place], [facility], [place]ver[place] [address] [place], [place]  [address]  [phone] 05/21/2019 4:33 PM

## 2019-05-21 NOTE — Progress Notes (Addendum)
Progress Note  Patient Name: Michael [name] Date of Encounter: 05/21/2019  Primary Cardiologist: Carlyle Dolly, MD   Patient profile   59 y/o male with history of sarcoidosis was transferred from [facility] with chest pain/NSTEMI. He has a history of presumed embolic STEMI, he was admitted in 12/20 with chest pain/STE lateral and inferior leads with cath showing occlusion of distal LAD and distal LCx>The distal LAD reperfused spontaneously while in cath lab while the distal LCx underwent PTCA but was left with only TIMI 1 flow. After discharging home with medical treatment, he went back to ED at [facility] again on the day of admission due to chest pain after he smoked a cigarette. Some St T changes but not change from prior EKG. His Trop was elevated at [phone]. He was sent to [facility] ED for suspected NSTEMI. Cardiac cath here did not show any occlusion but showed globaly low EF, concerning for CMP.  Subjective   Patient was seen and evaluated at bedside on morning rounds. He feels very well today. No chest pain and no shortness of breath. He had few runs of v-tach but no other events overnight.   Inpatient Medications    Scheduled Meds:  aspirin EC  81 mg Oral Daily   atorvastatin  80 mg Oral q1800   carvedilol  3.125 mg Oral BID WC   heparin  5,000 Units Subcutaneous Q8H   lisinopril  5 mg Oral Daily   predniSONE  20 mg Oral Q breakfast   pregabalin  200 mg Oral TID   sodium chloride flush  3 mL Intravenous Q12H   sodium chloride flush  3 mL Intravenous Q12H   ticagrelor  90 mg Oral BID   Continuous Infusions:  sodium chloride     nitroGLYCERIN Stopped (05/20/19 2314)   PRN Meds: sodium chloride, acetaminophen, albuterol, nitroGLYCERIN, ondansetron (ZOFRAN) IV, sodium chloride flush   Vital Signs    Vitals:   05/20/19 2240 05/20/19 2340 05/21/19 0538 05/21/19 0732  BP: 99/60 93/61 115/65 107/69  Pulse:   64 70  Resp:   15 16  Temp:   98.5 F (36.9 C) 98.7  F (37.1 C)  TempSrc:   Oral Oral  SpO2:   97% 99%  Weight:   103.7 kg   Height:        Intake/Output Summary (Last 24 hours) at 05/21/2019 1154 Last data filed at 05/21/2019 0747 Gross per 24 hour  Intake 1289.18 ml  Output 750 ml  Net 539.18 ml   Last 3 Weights 05/21/2019 05/20/2019 05/20/2019  Weight (lbs) 228 lb 9.6 oz 227 lb 3.2 oz 227 lb 3.2 oz  Weight (kg) 103.692 kg 103.057 kg 103.057 kg      Telemetry    PVC, short runs of V tcah - Personally Reviewed  ECG    Not done today. Personally Reviewed  Physical Exam   GEN: No acute distress.   Neck: No JVD Cardiac: RRR, no murmurs, rubs, or gallops.  Respiratory: Clear to auscultation bilaterally. GI: Soft, nontender, non-distended  MS: No edema; No deformity. Neuro:  Nonfocal  Psych: Normal affect   Labs    High Sensitivity Troponin:   Recent Labs  Lab 05/20/19 0537 05/20/19 0907  TROPONINIHS 4,718* 5,378*      Chemistry Recent Labs  Lab 05/20/19 0537  NA 142  K 3.8  CL 108  CO2 24  GLUCOSE 128*  BUN 14  CREATININE 1.39*  CALCIUM 8.8*  PROT 6.2*  ALBUMIN  3.1*  AST 55*  ALT 20  ALKPHOS 72  BILITOT 0.4  GFRNONAA 56*  GFRAA >60  ANIONGAP 10     Hematology Recent Labs  Lab 05/20/19 0537  WBC 16.8*  RBC 3.95*  HGB 12.0*  HCT 36.6*  MCV 92.7  MCH 30.4  MCHC 32.8  RDW 16.1*  PLT 283    BNP Recent Labs  Lab 05/20/19 0537  BNP 100.8*     DDimer No results for input(s): DDIMER in the last 168 hours.   Radiology    CARDIAC CATHETERIZATION  Result Date: 05/20/2019  There is moderate to severe left ventricular systolic dysfunction with regional and global hypokinesis. Ejection Fraction (EF) appears to be 25-35% by visual estimate.  LV end diastolic pressure is mildly elevated.  The previously occluded 1st Mrg treated with PTCA is widely patent.  Previously occluded Dist LAD lesionis now widely patent  Ost RCA lesion is 20% stenosed.  SUMMARY  Previously occluded distal circumflex  is widely patent as is the LAD.  Mild ostial RCA but otherwise normal, tortuous vessel.  Dilated Left Ventricle with global hypokinesis worse in the inferior and anterior apex.  EF estimated 30 of 35%.  Suspect NONISCHEMIC CARDIOMYOPATHY RECOMMENDATIONS  Recommend checking 2D echocardiogram for better assessment of EF and wall motion.  Evaluate for causes of Nonischemic Cardiomyopathy with elevated troponin -> consider myocarditis.  Further plans per primary service -> would potentially consider cardiac MRI with concern for myocarditis. Glenetta Hew, MD   Cardiac Studies   There is moderate to severe left ventricular systolic dysfunction with regional and global hypokinesis. Ejection Fraction (EF) appears to be 25-35% by visual estimate. LV end diastolic pressure is mildly elevated. The previously occluded 1st Mrg treated with PTCA is widely patent. Previously occluded Dist LAD lesionis now widely patent Ost RCA lesion is 20% stenosed.   SUMMARY Previously occluded distal circumflex is widely patent as is the LAD.  Mild ostial RCA but otherwise normal, tortuous vessel. Dilated Left Ventricle with global hypokinesis worse in the inferior and anterior apex.  EF estimated 30 of 35%. Suspect NONISCHEMIC CARDIOMYOPATHY     RECOMMENDATIONS Recommend checking 2D echocardiogram for better assessment of EF and wall motion. Evaluate for causes of Nonischemic Cardiomyopathy with elevated troponin -> consider myocarditis. Further plans per primary service -> would potentially consider cardiac MRI with concern for myocarditis.     Glenetta Hew, MD  Diagnostic Dominance: Right    Patient Profile     59 y.o. male with history of sarcoidosis was transferred from [facility] with chest pain/NSTEMI. He has a history of presumed embolic STEMI, he was admitted in 12/20 with chest pain/STE lateral and inferior leads with cath showing occlusion of distal LAD and distal LCx>The distal LAD  reperfused spontaneously while in cath lab while the distal LCx underwent PTCA but was left with only TIMI 1 flow. After discharging home with medical treatment, he went back to ED at [facility] again on the day of admission due to chest pain after he smoked a cigarette. Some St T changes but not change from prior EKG. His Trop was elevated at [phone] .He was sent to [facility] ED for suspected NSTEMI. Cardiac cath here did not show any occlusion but showed globaly low EF, concerning for CMP.   Assessment & Plan    CAD Cardiomyopathy:Recent hospitalization for inferolateral STEMI presumed to be embolic but no source found. He discharged home and went to ED again for chest pain where did not  have new EKG changes but Trop was elevated and he transfered to [facility] hospital for NSTEMI after starting on IV Hep and NG drip.  Cardiac cath performed. Prior occluded LAD and Circumflex that was seen on prior cath are now open. EF is globally low suggestive of CMP.  Cardiomyopathy: Will evaluate infiltrative vs non infiltrative CMP. TSH normal. Cardiac Sarcoidosis is top on the list of differentials and if proven, may need steroids. If rules out and if shown to be non infiltrative, will optimize supportive cardioprotective agent for CAD and asymptomatic HFrEF. Cardiac MRI performed. Result is pending.  -Cardiac MRI performed. Result pending - Continue ASA 81, ticagrelor.  - Continue atorvastatin 80 daily -Had few runs of V tach. K normal. Checking Mg and changing Metoprolol to Coreg 3.125 mg BID - Continue lisinopril.  - F/u cardiac MRI result.  -Likley DC home today with appropriate tx based on cardiac MRI result -Encouraged to quit smoking -Replace electrolytes as needed  HTN:  -Continue lisinopril.     -Change Metoprolol to Coreg   Sarcoidosis: On prednisone at home. His probable current finding of cardiomyopathy can be in setting of sarcoidosis. Doing further work up WBCs elevated, likely to  prednisone  - Continue home prednisone for now.   CKD stage 3: Creatinine at baseline. Hydrated prior to cath.  -BMP daily  For questions or updates, please contact [place] Please consult [email or web address] for contact info under        Signed, Dewayne Hatch, MD  05/21/2019, 11:54 AM     Agree with note by Dr.Masoudi  Results of cardiac MRI pending to rule out an infiltrative process such as sarcoidosis to explain his decrease in LV function.  His cath showed normal coronary arteries.  He is currently pain-free.  He is on appropriate medications for LV dysfunction.  If his MRI comes back infiltrative suggesting sarcoidosis we can increase his home oral prednisone dose.  Anticipated discharge later today.  Lorretta Harp, M.D., [place], [facility], [place] [address] [place], [place]  [address]  [phone] 05/21/2019 12:12 PM

## 2019-05-30 ENCOUNTER — Other Ambulatory Visit: Payer: Self-pay

## 2019-05-30 NOTE — Patient Outreach (Signed)
[facility]) Care Management  05/30/2019  TEREL HASER Jun 30, 1960 [id number]   Medication Adherence call to Mr. Kelon Conkin Telephone call to Patient regarding Medication Adherence unable to reach patient, patient did not answer,patient is past due on Atorvastatin 80 mg under [place].   [place] Management Direct Dial [phone]  Fax [phone] [email or web address]

## 2019-06-06 ENCOUNTER — Ambulatory Visit (INDEPENDENT_AMBULATORY_CARE_PROVIDER_SITE_OTHER): Payer: Medicare Other | Admitting: Cardiology

## 2019-06-06 ENCOUNTER — Encounter: Payer: Self-pay | Admitting: Cardiology

## 2019-06-06 VITALS — BP 135/96 | HR 70 | Temp 98.0°F | Ht 74.0 in | Wt 234.0 lb

## 2019-06-06 DIAGNOSIS — R42 Dizziness and giddiness: Secondary | ICD-10-CM | POA: Diagnosis not present

## 2019-06-06 DIAGNOSIS — I252 Old myocardial infarction: Secondary | ICD-10-CM | POA: Diagnosis not present

## 2019-06-06 NOTE — Progress Notes (Signed)
Clinical Summary Michael [name] is a 59 y.o.male seen today for follow up of the following medical problems.   1. History of embolic STEMI - admitted 03/2019 with STEMI - cath catho showed occluded distal LAD and OM1, probable emoblic occlusion. Spontaneous reprfusion of LAD, low level PTCA of distal LCX - 03/2019 echo LVEF 60-65%, normal RV - unclear source of emboli. No significaint finding by echo. No afib has been noted this admission  - walking regularly, up to 4-5 miles without troubles. - no recent chest pain, no DOE - compliant with meds.    05/2019 admitted again with chest pain. Trop up to 5300 -  cath 05/2019 showed no significant disease. LV gram 30-35% - 05/2019 cardiac MRI: LVEF 56%, inferolateral hypokinesis with delayed enhancement consistent with scar.   2. History of sarcoid - 05/2019 cardiac MRI without signs of cardiac involvement  3. Chronic pain - followed in pain clinic - has appt with Dr Michael [name] coming up.    4. Dizziness - episode Monday morning. - felt dizzy, reports head dropped. Had foot on breaks, ran into wall - isolated episode. - dizzy with standing. Water bottles x 2, occasional coffee, mountain cans x 3-4, orange juice x 2 glasses, no EtOH.        Past Medical History:  Diagnosis Date  . Allergic rhinitis   . Arthritis   . Chronic low back pain    follwed in Pain Clinic(Michael [name])  . CKD (chronic kidney disease), stage II   . Depression   . Depression with anxiety   . GERD (gastroesophageal reflux disease)   . History of kidney stones    passed  . HLD (hyperlipidemia) 03/23/2019  . Insomnia   . Migraines   . NSTEMI (non-ST elevated myocardial infarction) (Michael [name])   . Sarcoidosis    Multisystem,pulmonary and hepatic (Michael [name])Liver bx in 06 mildly postive AMA but on re-check normal  . Sleep apnea      Allergies  Allergen Reactions  . Methadone Other (See Comments)    REACTION: "Become mentally unstable"      Current Outpatient Medications  Medication Sig Dispense Refill  . acetaminophen (TYLENOL) 325 MG tablet Take 2 tablets (650 mg total) by mouth every 4 (four) hours as needed for headache or mild pain.    Marland Kitchen albuterol (PROVENTIL) (2.5 MG/3ML) 0.083% nebulizer solution Take 2.5 mg by nebulization every 6 (six) hours as needed for wheezing or shortness of breath.     Marland Kitchen aspirin 81 MG chewable tablet Chew 1 tablet (81 mg total) by mouth daily.    Marland Kitchen atorvastatin (LIPITOR) 80 MG tablet TAKE ONE TABLET BY MOUTH DAILY AT 6 PM. (Patient taking differently: Take 80 mg by mouth daily. ) 30 tablet 6  . lisinopril (ZESTRIL) 5 MG tablet Take 1 tablet (5 mg total) by mouth daily. 30 tablet 6  . metoprolol succinate (TOPROL-XL) 25 MG 24 hr tablet Take 0.5 tablets (12.5 mg total) by mouth daily. 15 tablet 6  . nitroGLYCERIN (NITROSTAT) 0.4 MG SL tablet Place 1 tablet (0.4 mg total) under the tongue every 5 (five) minutes x 3 doses as needed for chest pain. 25 tablet 4  . predniSONE (DELTASONE) 20 MG tablet Take 1 tablet (20 mg total) by mouth daily with breakfast. 30 tablet 1  . pregabalin (LYRICA) 200 MG capsule Take 200 mg by mouth 3 (three) times daily.     . ticagrelor (BRILINTA) 90 MG TABS tablet Take 1 tablet (90 mg total) by  mouth 2 (two) times daily. 60 tablet 11  . zolpidem (AMBIEN) 5 MG tablet Take 5 mg by mouth at bedtime.     No current facility-administered medications for this visit.     Past Surgical History:  Procedure Laterality Date  . BACK SURGERY  2012   2nd back surgery 3 monthes ago and in 2007  . CARDIAC CATHETERIZATION  05/20/2019  . COLONOSCOPY  11/05/2003   [id number] internal hemorrhoids, otherwise normal rectum and colon  . CORONARY ARTERY BYPASS GRAFT  2017  . CORONARY/GRAFT ACUTE MI REVASCULARIZATION N/A 03/20/2019   Procedure: Coronary/Graft Acute MI Revascularization;  Surgeon: Michael Sine, MD;  Location: [place] [facility];  Service: Cardiovascular;  Laterality:  N/A;  . ESOPHAGOGASTRODUODENOSCOPY  03/22/2004   [id number] esophagogastroduodenoscopy. Small hiatal hernia otherwise normal stomach  . HARVEST BONE GRAFT Left    thigh  . HIP ARTHROPLASTY Bilateral    left 2017, right 2016  . HIP SURGERY Left 2017   bone graft  . LAMINECTOMY  2009   Balateral L4-L5  . LEFT HEART CATH AND CORONARY ANGIOGRAPHY N/A 03/20/2019   Procedure: LEFT HEART CATH AND CORONARY ANGIOGRAPHY;  Surgeon: Michael Sine, MD;  Location: [place] [facility];  Service: Cardiovascular;  Laterality: N/A;  . LEFT HEART CATH AND CORONARY ANGIOGRAPHY N/A 05/20/2019   Procedure: LEFT HEART CATH AND CORONARY ANGIOGRAPHY;  Surgeon: Leonie Man, MD;  Location: [place] [facility];  Service: Cardiovascular;  Laterality: N/A;  . RESECTION DISTAL CLAVICAL Right 01/23/2019   Procedure: OPEN DISTAL CLAVICLE EXCISION;  Surgeon: Michael Civil, MD;  Location: [facility];  Service: Orthopedics;  Laterality: Right;  . TOOTH EXTRACTION Bilateral 12/28/2017   Procedure: DENTAL RESTORATION/EXTRACTIONS;  Surgeon: Michael [name], DDS;  Location: [facility];  Service: Oral Surgery;  Laterality: Bilateral;     Allergies  Allergen Reactions  . Methadone Other (See Comments)    REACTION: "Become mentally unstable"      Family History  Problem Relation Age of Onset  . Colon cancer Brother        in his 43s     Social History Michael [name] reports that he has been smoking cigarettes. He has a 14.50 pack-year smoking history. He has never used smokeless tobacco. Michael [name] reports current alcohol use.   Review of Systems CONSTITUTIONAL: No weight loss, fever, chills, weakness or fatigue.  HEENT: Eyes: No visual loss, blurred vision, double vision or yellow sclerae.No hearing loss, sneezing, congestion, runny nose or sore throat.  SKIN: No rash or itching.  CARDIOVASCULAR: per hpi RESPIRATORY: No shortness of breath, cough or sputum.  GASTROINTESTINAL: No anorexia, nausea, vomiting or  diarrhea. No abdominal pain or blood.  GENITOURINARY: No burning on urination, no polyuria NEUROLOGICAL: per hpi MUSCULOSKELETAL: No muscle, back pain, joint pain or stiffness.  LYMPHATICS: No enlarged nodes. No history of splenectomy.  PSYCHIATRIC: No history of depression or anxiety.  ENDOCRINOLOGIC: No reports of sweating, cold or heat intolerance. No polyuria or polydipsia.  Marland Kitchen   Physical Examination Today's Vitals   06/06/19 0914  BP: (!) 135/96  Pulse: 70  Temp: 98 F (36.7 C)  SpO2: 98%  Weight: 234 lb (106.1 kg)  Height: 6\' 2"  (1.88 m)   Body mass index is 30.04 kg/m.  Gen: resting comfortably, no acute distress HEENT: no scleral icterus, pupils equal round and reactive, no palptable cervical adenopathy,  CV: RRR, no m/r/g, no jvd Resp: Clear to auscultation bilaterally GI: abdomen is soft, non-tender, non-distended, normal  bowel sounds, no hepatosplenomegaly MSK: extremities are warm, no edema.  Skin: warm, no rash Neuro:  no focal deficits Psych: appropriate affect   Diagnostic Studies  03/2019 cath  Dist LAD lesion is 100% stenosed.  1st Mrg lesion is 100% stenosed.  The left ventricular systolic function is normal.  The left ventricular ejection fraction is 50-55% by visual estimate.  Post intervention, there is a 0% residual stenosis.  Probable embolic occlusion of the distal OM Joci Dress of the circumflex as well as distal LAD.  Normal RCA with superior takeoff.  Low normal global LV function with EF estimated 50 to 55% and focal area of mid anterolateral and focal apical hypocontractility.  Low level PTCA of the distal circumflex with improvement of flow from TIMI 0 to TIMI I in a small caliber distal vessel.  Spontaneous reperfusion of the LAD which ultimately wraps around the LV apex.  RECOMMENDATION: Aggrastat was started due to high suspect for embolic etiology rather than atherosclerotic plaque; continue for 18 hours post procedure.  DAPT with aspirin/Brilinta. Smoking cessation is imperative. We will plan for echo Doppler study in a.m.   03/2019 echo IMPRESSIONS   1. Left ventricular ejection fraction, by visual estimation, is 60 to 65%. The left ventricle has normal function. There is mildly increased left ventricular hypertrophy. 2. The left ventricle has no regional wall motion abnormalities. 3. Global right ventricle has normal systolic function.The right ventricular size is normal. No increase in right ventricular wall thickness. 4. Left atrial size was normal. 5. Right atrial size was normal. 6. The mitral valve is normal in structure. No evidence of mitral valve regurgitation. 7. The tricuspid valve is normal in structure. Tricuspid valve regurgitation is not demonstrated. 8. The aortic valve is grossly normal. Aortic valve regurgitation is not visualized. No evidence of aortic valve sclerosis or stenosis. 9. The pulmonic valve was normal in structure. Pulmonic valve regurgitation is not visualized. 10. Technically difficult echo with poor image quality. 11. The atrial septum is grossly normal.   05/2019 cath  There is moderate to severe left ventricular systolic dysfunction with regional and global hypokinesis. Ejection Fraction (EF) appears to be 25-35% by visual estimate.  LV end diastolic pressure is mildly elevated.  The previously occluded 1st Mrg treated with PTCA is widely patent.  Previously occluded Dist LAD lesionis now widely patent  Ost RCA lesion is 20% stenosed.   SUMMARY  Previously occluded distal circumflex is widely patent as is the LAD.  Mild ostial RCA but otherwise normal, tortuous vessel.  Dilated Left Ventricle with global hypokinesis worse in the inferior and anterior apex.  EF estimated 30 of 35%.  Suspect NONISCHEMIC CARDIOMYOPATHY   RECOMMENDATIONS  Recommend checking 2D echocardiogram for better assessment of EF and wall motion.  Evaluate for  causes of Nonischemic Cardiomyopathy with elevated troponin -> consider myocarditis.  Further plans per primary service -> would potentially consider cardiac MRI with concern for myocarditis.  Assessment and Plan   1. History of STEMI - appeared to be a cardioembolic MI, did not require stenting - unclear source of thromboembolism. Benign recent 30 day monitor - repeat cath with patent vessels -continue medical therapy  2. Dizziness - unclear isolated episode of dizziness, lightheadedness. Said he dropped his head but does not believe he loss consciousness. No recurrent episodes. Orthostatics negative. Recent 30 day monitor with short runs of NSVT, nothing sustained - he will discuss episode with his upcoming neuro appt, not a clear cardiac etiology at this time -  if recurrent episodes without clear neuro etiology by evaluation and more suggestive of cardiac etiology by history, given his prior NSVT would consider loop recorder placement      Arnoldo Lenis, M.D.

## 2019-06-06 NOTE — Patient Instructions (Signed)

## 2019-06-09 ENCOUNTER — Other Ambulatory Visit: Payer: Self-pay

## 2019-06-09 ENCOUNTER — Encounter: Payer: Self-pay | Admitting: Orthopedic Surgery

## 2019-06-09 ENCOUNTER — Ambulatory Visit: Payer: Medicare Other

## 2019-06-09 ENCOUNTER — Ambulatory Visit (INDEPENDENT_AMBULATORY_CARE_PROVIDER_SITE_OTHER): Payer: Medicare Other | Admitting: Orthopedic Surgery

## 2019-06-09 VITALS — BP 139/87 | HR 89 | Ht 74.0 in | Wt 230.0 lb

## 2019-06-09 DIAGNOSIS — M25511 Pain in right shoulder: Secondary | ICD-10-CM | POA: Diagnosis not present

## 2019-06-09 DIAGNOSIS — I252 Old myocardial infarction: Secondary | ICD-10-CM | POA: Diagnosis not present

## 2019-06-09 DIAGNOSIS — Z9889 Other specified postprocedural states: Secondary | ICD-10-CM | POA: Diagnosis not present

## 2019-06-09 DIAGNOSIS — G8929 Other chronic pain: Secondary | ICD-10-CM

## 2019-06-09 DIAGNOSIS — M545 Low back pain: Secondary | ICD-10-CM | POA: Diagnosis not present

## 2019-06-09 NOTE — Patient Instructions (Signed)
Shoulder Range of Motion Exercises Shoulder range of motion (ROM) exercises are done to keep the shoulder moving freely or to increase movement. They are often recommended for people who have shoulder pain or stiffness or who are recovering from a shoulder surgery. Phase 1 exercises When you are able, do this exercise 1-2 times per day for 30-60 seconds in each direction, or as directed by your health care provider. Pendulum exercise To do this exercise while sitting: 1. Sit in a chair or at the edge of your bed with your feet flat on the floor. 2. Let your affected arm hang down in front of you over the edge of the bed or chair. 3. Relax your shoulder, arm, and hand. 4. Rock your body so your arm gently swings in small circles. You can also use your unaffected arm to start the motion. 5. Repeat changing the direction of the circles, swinging your arm left and right, and swinging your arm forward and back. To do this exercise while standing: 1. Stand next to a sturdy chair or table, and hold on to it with your hand on your unaffected side. 2. Bend forward at the waist. 3. Bend your knees slightly. 4. Relax your shoulder, arm, and hand. 5. While keeping your shoulder relaxed, use body motion to swing your arm in small circles. 6. Repeat changing the direction of the circles, swinging your arm left and right, and swinging your arm forward and back. 7. Between exercises, stand up tall and take a short break to relax your lower back.  Phase 2 exercises Do these exercises 1-2 times per day or as told by your health care provider. Hold each stretch for 30 seconds, and repeat 3 times. Do the exercises with one or both arms as instructed by your health care provider. For these exercises, sit at a table with your hand and arm supported by the table. A chair that slides easily or has wheels can be helpful. External rotation 1. Turn your chair so that your affected side is nearest to the  table. 2. Place your forearm on the table to your side. Bend your elbow about 90 at the elbow (right angle) and place your hand palm facing down on the table. Your elbow should be about 6 inches away from your side. 3. Keeping your arm on the table, lean your body forward. Abduction 1. Turn your chair so that your affected side is nearest to the table. 2. Place your forearm and hand on the table so that your thumb points toward the ceiling and your arm is straight out to your side. 3. Slide your hand out to the side and away from you, using your unaffected arm to do the work. 4. To increase the stretch, you can slide your chair away from the table. Flexion: forward stretch 1. Sit facing the table. Place your hand and elbow on the table in front of you. 2. Slide your hand forward and away from you, using your unaffected arm to do the work. 3. To increase the stretch, you can slide your chair backward. Phase 3 exercises Do these exercises 1-2 times per day or as told by your health care provider. Hold each stretch for 30 seconds, and repeat 3 times. Do the exercises with one or both arms as instructed by your health care provider. Cross-body stretch: posterior capsule stretch 1. Lift your arm straight out in front of you. 2. Bend your arm 90 at the elbow (right angle) so your forearm   moves across your body. 3. Use your other arm to gently pull the elbow across your body, toward your other shoulder. Wall climbs 1. Stand with your affected arm extended out to the side with your hand resting on a door frame. 2. Slide your hand slowly up the door frame. 3. To increase the stretch, step through the door frame. Keep your body upright and do not lean. Wand exercises You will need a cane, a piece of PVC pipe, or a sturdy wooden dowel for wand exercises. Flexion To do this exercise while standing: 1. Hold the wand with both of your hands, palms down. 2. Using the other arm to help, lift your arms  up and over your head, if able. 3. Push upward with your other arm to gently increase the stretch. To do this exercise while lying down: 1. Lie on your back with your elbows resting on the floor and the wand in both your hands. Your hands will be palm down, or pointing toward your feet. 2. Lift your hands toward the ceiling, using your unaffected arm to help if needed. 3. Bring your arms overhead as able, using your unaffected arm to help if needed. Internal rotation 1. Stand while holding the wand behind you with both hands. Your unaffected arm should be extended above your head with the arm of the affected side extended behind you at the level of your waist. The wand should be pointing straight up and down as you hold it. 2. Slowly pull the wand up behind your back by straightening the elbow of your unaffected arm and bending the elbow of your affected arm. External rotation 1. Lie on your back with your affected upper arm supported on a small pillow or rolled towel. When you first do this exercise, keep your upper arm close to your body. Over time, bring your arm up to a 90 angle out to the side. 2. Hold the wand across your stomach and with both hands palm up. Your elbow on your affected side should be bent at a 90 angle. 3. Use your unaffected side to help push your forearm away from you and toward the floor. Keep your elbow on your affected side bent at a 90 angle. Contact a health care provider if you have:  New or increasing pain.  New numbness, tingling, weakness, or discoloration in your arm or hand. This information is not intended to replace advice given to you by your health care provider. Make sure you discuss any questions you have with your health care provider. Document Revised: 05/09/2017 Document Reviewed: 05/09/2017 Elsevier Patient Education  2020 [employer]  

## 2019-06-09 NOTE — Progress Notes (Addendum)
Michael [name]  06/09/2019  Body mass index is 29.53 kg/m.   HISTORY SECTION :  Chief Complaint  Patient presents with  . Post-op Follow-up    right shoulder pain states severe and popping out of place    15 male status post distal clavicle excision history of chronic pain status post 2 recent MIs presents with complaint of his shoulder popping out of place pain when he lies on it at night pain is over the anterior shoulder joint and scapular spine  Review of Systems  Musculoskeletal: Positive for neck pain.  Neurological: Positive for tingling.     has a past medical history of Allergic rhinitis, Arthritis, Chronic low back pain, CKD (chronic kidney disease), stage II, Depression, Depression with anxiety, GERD (gastroesophageal reflux disease), History of kidney stones, HLD (hyperlipidemia) (03/23/2019), Insomnia, Migraines, NSTEMI (non-ST elevated myocardial infarction) ([place]), Sarcoidosis, and Sleep apnea.   Past Surgical History:  Procedure Laterality Date  . BACK SURGERY  2012   2nd back surgery 3 monthes ago and in 2007  . CARDIAC CATHETERIZATION  05/20/2019  . COLONOSCOPY  11/05/2003   [id number] internal hemorrhoids, otherwise normal rectum and colon  . CORONARY ARTERY BYPASS GRAFT  2017  . CORONARY/GRAFT ACUTE MI REVASCULARIZATION N/A 03/20/2019   Procedure: Coronary/Graft Acute MI Revascularization;  Surgeon: Troy Sine, MD;  Location: [place];  Service: Cardiovascular;  Laterality: N/A;  . ESOPHAGOGASTRODUODENOSCOPY  03/22/2004   [id number] esophagogastroduodenoscopy. Small hiatal hernia otherwise normal stomach  . HARVEST BONE GRAFT Left    thigh  . HIP ARTHROPLASTY Bilateral    left 2017, right 2016  . HIP SURGERY Left 2017   bone graft  . LAMINECTOMY  2009   Balateral L4-L5  . LEFT HEART CATH AND CORONARY ANGIOGRAPHY N/A 03/20/2019   Procedure: LEFT HEART CATH AND CORONARY ANGIOGRAPHY;  Surgeon: Troy Sine, MD;  Location: [place] [facility];   Service: Cardiovascular;  Laterality: N/A;  . LEFT HEART CATH AND CORONARY ANGIOGRAPHY N/A 05/20/2019   Procedure: LEFT HEART CATH AND CORONARY ANGIOGRAPHY;  Surgeon: Leonie Man, MD;  Location: [facility];  Service: Cardiovascular;  Laterality: N/A;  . RESECTION DISTAL CLAVICAL Right 01/23/2019   Procedure: OPEN DISTAL CLAVICLE EXCISION;  Surgeon: Carole Civil, MD;  Location: [facility];  Service: Orthopedics;  Laterality: Right;  . TOOTH EXTRACTION Bilateral 12/28/2017   Procedure: DENTAL RESTORATION/EXTRACTIONS;  Surgeon: Diona Browner, DDS;  Location: [place];  Service: Oral Surgery;  Laterality: Bilateral;    Body mass index is 29.53 kg/m.   Allergies  Allergen Reactions  . Methadone Other (See Comments)    REACTION: "Become mentally unstable"     Current Outpatient Medications:  .  acetaminophen (TYLENOL) 325 MG tablet, Take 2 tablets (650 mg total) by mouth every 4 (four) hours as needed for headache or mild pain., Disp:  , Rfl:  .  albuterol (PROVENTIL) (2.5 MG/3ML) 0.083% nebulizer solution, Take 2.5 mg by nebulization every 6 (six) hours as needed for wheezing or shortness of breath. , Disp: , Rfl:  .  aspirin 81 MG chewable tablet, Chew 1 tablet (81 mg total) by mouth daily., Disp:  , Rfl:  .  atorvastatin (LIPITOR) 80 MG tablet, TAKE ONE TABLET BY MOUTH DAILY AT 6 PM. (Patient taking differently: Take 80 mg by mouth daily. ), Disp: 30 tablet, Rfl: 6 .  lisinopril (ZESTRIL) 5 MG tablet, Take 1 tablet (5 mg total) by mouth daily., Disp: 30 tablet, Rfl: 6 .  metoprolol succinate (TOPROL-XL) 25 MG 24 hr tablet, Take 0.5 tablets (12.5 mg total) by mouth daily., Disp: 15 tablet, Rfl: 6 .  nitroGLYCERIN (NITROSTAT) 0.4 MG SL tablet, Place 1 tablet (0.4 mg total) under the tongue every 5 (five) minutes x 3 doses as needed for chest pain., Disp: 25 tablet, Rfl: 4 .  predniSONE (DELTASONE) 20 MG tablet, Take 1 tablet (20 mg total) by mouth daily with breakfast., Disp: 30  tablet, Rfl: 1 .  pregabalin (LYRICA) 200 MG capsule, Take 200 mg by mouth 3 (three) times daily. , Disp: , Rfl:  .  ticagrelor (BRILINTA) 90 MG TABS tablet, Take 1 tablet (90 mg total) by mouth 2 (two) times daily., Disp: 60 tablet, Rfl: 11 .  zolpidem (AMBIEN) 5 MG tablet, Take 5 mg by mouth at bedtime., Disp: , Rfl:    PHYSICAL EXAM SECTION: 1) BP 139/87   Pulse 89   Ht 6\' 2"  (1.88 m)   Wt 230 lb (104.3 kg)   BMI 29.53 kg/m   Body mass index is 29.53 kg/m. General appearance: Well-developed well-nourished no gross deformities  2) Cardiovascular normal pulse and perfusion , normal color   3) Neurologically deep tendon reflexes are equal and normal, no sensation loss or deficits no pathologic reflexes  4) Psychological: Awake alert and oriented x3 mood and affect normal  5) Skin no lacerations or ulcerations no nodularity no palpable masses, no erythema or nodularity  6) Musculoskeletal:  Surgical scar has healed.  Tenderness in the posterior subscapular area with some tenderness and decreased range of motion cervical spine with normal stability in the shoulder in abduction external rotation no evidence of subluxation inferiorly  Cuff strength is normal     MEDICAL DECISION MAKING  A.  Encounter Diagnoses  Name Primary?  . S/P shoulder surgery Yes  . Chronic right shoulder pain     B. DATA ANALYSED:  IMAGING:  Office x-rays are normal  Patient has chronic pain syndrome No complications noted after the distal clavicle excision Subacromial injection Home exercises Follow-up as needed   Procedure note the subacromial injection shoulder RIGHT    Verbal consent was obtained to inject the  RIGHT   Shoulder  Timeout was completed to confirm the injection site is a subacromial space of the  RIGHT  shoulder   Medication used Depo-Medrol 40 mg and lidocaine 1% 3 cc  Anesthesia was provided by ethyl chloride  The injection was performed in the RIGHT  posterior  subacromial space. After pinning the skin with alcohol and anesthetized the skin with ethyl chloride the subacromial space was injected using a 20-gauge needle. There were no complications  Sterile dressing was applied.   No orders of the defined types were placed in this encounter.     Arther Abbott, MD  06/09/2019 9:49 AM

## 2019-06-26 DIAGNOSIS — G5793 Unspecified mononeuropathy of bilateral lower limbs: Secondary | ICD-10-CM | POA: Diagnosis not present

## 2019-06-26 DIAGNOSIS — G629 Polyneuropathy, unspecified: Secondary | ICD-10-CM | POA: Diagnosis not present

## 2019-06-26 DIAGNOSIS — R600 Localized edema: Secondary | ICD-10-CM | POA: Diagnosis not present

## 2019-06-26 DIAGNOSIS — M5137 Other intervertebral disc degeneration, lumbosacral region: Secondary | ICD-10-CM | POA: Diagnosis not present

## 2019-06-26 DIAGNOSIS — B351 Tinea unguium: Secondary | ICD-10-CM | POA: Diagnosis not present

## 2019-07-02 ENCOUNTER — Other Ambulatory Visit: Payer: Self-pay

## 2019-07-02 DIAGNOSIS — M79609 Pain in unspecified limb: Secondary | ICD-10-CM | POA: Diagnosis not present

## 2019-07-02 DIAGNOSIS — M5416 Radiculopathy, lumbar region: Secondary | ICD-10-CM | POA: Diagnosis not present

## 2019-07-02 DIAGNOSIS — G894 Chronic pain syndrome: Secondary | ICD-10-CM | POA: Diagnosis not present

## 2019-07-02 DIAGNOSIS — M545 Low back pain: Secondary | ICD-10-CM | POA: Diagnosis not present

## 2019-07-02 DIAGNOSIS — M25511 Pain in right shoulder: Secondary | ICD-10-CM | POA: Diagnosis not present

## 2019-07-02 NOTE — Patient Outreach (Signed)
Michael [name]) Care Management  07/02/2019  Michael [name] January 30, 1961 [id number]   Medication Adherence call to Michael [name] [facility] Identifiers Verify spoke with patient he is past due on Atorvastatin 80 mg and Lisinopril 5 mg,patient explain he is taking both medication,patient was at doctors office and ask to call him back. Michael [name] is showing past due under [place].   [facility] Direct Dial [phone]  Fax [phone] [name]

## 2019-07-07 DIAGNOSIS — G5793 Unspecified mononeuropathy of bilateral lower limbs: Secondary | ICD-10-CM | POA: Diagnosis not present

## 2019-07-07 DIAGNOSIS — R600 Localized edema: Secondary | ICD-10-CM | POA: Diagnosis not present

## 2019-07-07 DIAGNOSIS — G8929 Other chronic pain: Secondary | ICD-10-CM | POA: Diagnosis not present

## 2019-07-07 DIAGNOSIS — G629 Polyneuropathy, unspecified: Secondary | ICD-10-CM | POA: Diagnosis not present

## 2019-07-21 ENCOUNTER — Ambulatory Visit: Payer: Medicare Other | Admitting: Cardiology

## 2019-07-22 ENCOUNTER — Encounter: Payer: Self-pay | Admitting: Internal Medicine

## 2019-07-24 DIAGNOSIS — R600 Localized edema: Secondary | ICD-10-CM | POA: Diagnosis not present

## 2019-07-24 DIAGNOSIS — G629 Polyneuropathy, unspecified: Secondary | ICD-10-CM | POA: Diagnosis not present

## 2019-07-24 DIAGNOSIS — M545 Low back pain: Secondary | ICD-10-CM | POA: Diagnosis not present

## 2019-07-24 DIAGNOSIS — G5793 Unspecified mononeuropathy of bilateral lower limbs: Secondary | ICD-10-CM | POA: Diagnosis not present

## 2019-07-24 DIAGNOSIS — Z79899 Other long term (current) drug therapy: Secondary | ICD-10-CM | POA: Diagnosis not present

## 2019-07-24 DIAGNOSIS — D869 Sarcoidosis, unspecified: Secondary | ICD-10-CM | POA: Diagnosis not present

## 2019-07-30 DIAGNOSIS — M79609 Pain in unspecified limb: Secondary | ICD-10-CM | POA: Diagnosis not present

## 2019-07-30 DIAGNOSIS — M5416 Radiculopathy, lumbar region: Secondary | ICD-10-CM | POA: Diagnosis not present

## 2019-07-30 DIAGNOSIS — M545 Low back pain: Secondary | ICD-10-CM | POA: Diagnosis not present

## 2019-07-30 DIAGNOSIS — I219 Acute myocardial infarction, unspecified: Secondary | ICD-10-CM | POA: Diagnosis not present

## 2019-07-30 DIAGNOSIS — M25511 Pain in right shoulder: Secondary | ICD-10-CM | POA: Diagnosis not present
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
## 2019-08-05 ENCOUNTER — Ambulatory Visit (INDEPENDENT_AMBULATORY_CARE_PROVIDER_SITE_OTHER): Payer: Medicare Other | Admitting: Family Medicine

## 2019-08-05 ENCOUNTER — Encounter: Payer: Self-pay | Admitting: Family Medicine

## 2019-08-05 ENCOUNTER — Other Ambulatory Visit: Payer: Self-pay

## 2019-08-05 VITALS — BP 128/82 | HR 57 | Ht 74.0 in | Wt 236.0 lb

## 2019-08-05 DIAGNOSIS — I739 Peripheral vascular disease, unspecified: Secondary | ICD-10-CM

## 2019-08-05 DIAGNOSIS — R42 Dizziness and giddiness: Secondary | ICD-10-CM

## 2019-08-05 DIAGNOSIS — I2119 ST elevation (STEMI) myocardial infarction involving other coronary artery of inferior wall: Secondary | ICD-10-CM

## 2019-08-05 NOTE — Patient Instructions (Addendum)
Medication Instructions:   Your physician recommends that you continue on your current medications as directed. Please refer to the Current Medication list given to you today.  Labwork:  NONE  Testing/Procedures:  Your physician has requested that you have a lower extremity arterial exercise duplex. During this test, exercise and ultrasound are used to evaluate arterial blood flow in the legs. Allow one hour for this exam. There are no restrictions or special instructions. Your physician has requested that you have an ankle brachial index (ABI). During this test an ultrasound and blood pressure cuff are used to evaluate the arteries that supply the arms and legs with blood. Allow thirty minutes for this exam. There are no restrictions or special instructions.  Follow-Up:  Your physician recommends that you schedule a follow-up appointment in: 3 months (office).   Any Other Special Instructions Will Be Listed Below (If Applicable).  If you need a refill on your cardiac medications before your next appointment, please call your pharmacy.

## 2019-08-06 ENCOUNTER — Other Ambulatory Visit: Payer: Self-pay | Admitting: Cardiology

## 2019-08-06 NOTE — Telephone Encounter (Signed)
Rx request sent to pharmacy.  

## 2019-08-07 ENCOUNTER — Other Ambulatory Visit: Payer: Self-pay | Admitting: Family Medicine

## 2019-08-07 DIAGNOSIS — I739 Peripheral vascular disease, unspecified: Secondary | ICD-10-CM

## 2019-08-08 ENCOUNTER — Other Ambulatory Visit: Payer: Self-pay

## 2019-08-08 ENCOUNTER — Ambulatory Visit (INDEPENDENT_AMBULATORY_CARE_PROVIDER_SITE_OTHER): Payer: Medicare Other | Admitting: Gastroenterology

## 2019-08-08 ENCOUNTER — Encounter: Payer: Self-pay | Admitting: Gastroenterology

## 2019-08-08 DIAGNOSIS — Z800 Family history of malignant neoplasm of digestive organs: Secondary | ICD-10-CM

## 2019-08-08 DIAGNOSIS — R159 Full incontinence of feces: Secondary | ICD-10-CM | POA: Insufficient documentation

## 2019-08-08 MED ORDER — HYDROCORTISONE (PERIANAL) 2.5 % EX CREA: 1.0000 "application " | TOPICAL_CREAM | Freq: Two times a day (BID) | CUTANEOUS | 1 refills | Status: DC

## 2019-08-08 NOTE — Progress Notes (Signed)
Primary Care Physician:  Leonie Douglas, MD  Primary Gastroenterologist:  Garfield Cornea, MD   Chief Complaint  Patient presents with  . chronic encopresis    x1 yr. reports he has "stains" in underwear if he walks/movement    HPI:  Michael [name] is a 59 y.o. male with history of multisystem sarcoidosis with hepatic involvement (liver biopsy 2006), chronic intermittent fecal incontinence, chronic kidney disease, history of myocardial infarctions presenting for further evaluation of ongoing fecal incontinence.  Patient last seen in 2019 for fecal incontinence.  At that time he reported symptoms at least for a couple years.  He also reported weight loss.  He had a CT abdomen pelvis with contrast showed fatty liver, small fat-containing umbilical hernia. He was scheduled for an EGD and colonoscopy to evaluate upper GI symptoms, rectal bleeding, fecal incontinence and due to family history of colon cancer.  Patient never completed.  As we last saw him he has been hospitalized twice due to acute MI.  In December 2020 he had STEMI involving the left circumflex coronary artery, status post PTCA.  It was felt that he had embolic etiology rather than atherosclerotic plaque, aspirin and Brilinta recommended.  May 20, 2019, presented back to the hospital with chest pain and determined to have NSTEMI.  He was noted to have new global LV dysfunction with EF of 30 to 35% suggestive of nonischemic cardiomyopathy.  Cardiac MRI performed for concern for cardiomyopathy related to sarcoidosis, however there was no evidence of infiltrative cardiomyopathy noted.  He has appointment to see Dr. Harl Bowie on June 3.  Last seen by cardiology on April 27.  Patient states he walks about 3 miles per day.  At times he will feel wet around his rectum when he gets home he will find clumps of stool in his underwear.  2 times recently while in bed watching TV he has had incontinence of large amounts of stool without warning.  He  has a bowel movement every day.  Sometimes stool is solid.  Other times liquid to watery.  No recent bright red blood per rectum.  He has some burning discomfort at times with bowel movements.  He has some vague lower abdominal pain, stabbing in quality, happening 1-2 times per month lasting for couple of minutes at a time.  He feels like this may be related to his back issues. No heartburn. No n/v.  Fecal incontinence may happen every day several days in a row but then may go days at a time without issues.  Sometimes he passes only a small smear but it there other times incontinent of larger quantities of stool.  Often will have the urge to have a bowel movement associated with urgency and if unable to get to the bathroom in time he will be incontinent.   Patient had attempted colonoscopy in 2012, he did not complete bowel prep and procedure was aborted due to poor prep.  EGD and colonoscopy in 2005: EGD normal except for small hiatal hernia, small bowel biopsies obtained, pathology unavailable to me at this time. Colonoscopy showed minimal internal hemorrhoids, normal colon and terminal ileum.   Current Outpatient Medications  Medication Sig Dispense Refill  . acetaminophen (TYLENOL) 325 MG tablet Take 2 tablets (650 mg total) by mouth every 4 (four) hours as needed for headache or mild pain.    Marland Kitchen albuterol (PROVENTIL) (2.5 MG/3ML) 0.083% nebulizer solution Take 2.5 mg by nebulization every 6 (six) hours as needed for wheezing or shortness of  breath.     Marland Kitchen atorvastatin (LIPITOR) 80 MG tablet TAKE ONE TABLET BY MOUTH DAILY AT 6 PM. (Patient taking differently: Take 80 mg by mouth daily. ) 30 tablet 6  . hydrochlorothiazide (MICROZIDE) 12.5 MG capsule Take 12.5 mg by mouth daily.    Marland Kitchen lisinopril (ZESTRIL) 5 MG tablet Take 1 tablet (5 mg total) by mouth daily. 30 tablet 6  . metoprolol succinate (TOPROL-XL) 25 MG 24 hr tablet Take 0.5 tablets (12.5 mg total) by mouth daily. 15 tablet 6  . nitroGLYCERIN  (NITROSTAT) 0.4 MG SL tablet DISSOLVE ONE TABLET UNDER TONGUE EVERY 5 MINUTES UP TO 3 DOSES AS NEEDED FOR CHEST PAIN. 25 tablet 4  . pregabalin (LYRICA) 200 MG capsule Take 200 mg by mouth 3 (three) times daily.     . ticagrelor (BRILINTA) 90 MG TABS tablet Take 1 tablet (90 mg total) by mouth 2 (two) times daily. 60 tablet 11  . zolpidem (AMBIEN) 5 MG tablet Take 5 mg by mouth at bedtime.     No current facility-administered medications for this visit.    Allergies as of 08/08/2019 - Review Complete 08/08/2019  Allergen Reaction Noted  . Methadone Other (See Comments)     Past Medical History:  Diagnosis Date  . Allergic rhinitis   . Arthritis   . Chronic low back pain    follwed in [facility](Dr.Kirchmayer)  . CKD (chronic kidney disease), stage II   . Depression   . Depression with anxiety   . GERD (gastroesophageal reflux disease)   . History of kidney stones    passed  . HLD (hyperlipidemia) 03/23/2019  . Insomnia   . Migraines   . NSTEMI (non-ST elevated myocardial infarction) ([place])   . Sarcoidosis    Multisystem,pulmonary and hepatic (Dr.Rourk)Liver bx in 06 mildly postive AMA but on re-check normal  . Sleep apnea     Past Surgical History:  Procedure Laterality Date  . BACK SURGERY  2012   2nd back surgery 3 monthes ago and in 2007  . CARDIAC CATHETERIZATION  05/20/2019  . COLONOSCOPY  11/05/2003   [id number] internal hemorrhoids, otherwise normal rectum and colon  . CORONARY ARTERY BYPASS GRAFT  2017  . CORONARY/GRAFT ACUTE MI REVASCULARIZATION N/A 03/20/2019   Procedure: Coronary/Graft Acute MI Revascularization;  Surgeon: Troy Sine, MD;  Location: [facility];  Service: Cardiovascular;  Laterality: N/A;  . ESOPHAGOGASTRODUODENOSCOPY  03/22/2004   [id number] esophagogastroduodenoscopy. Small hiatal hernia otherwise normal stomach  . HARVEST BONE GRAFT Left    thigh  . HIP ARTHROPLASTY Bilateral    left 2017, right 2016  . HIP SURGERY Left  2017   bone graft  . LAMINECTOMY  2009   Balateral L4-L5  . LEFT HEART CATH AND CORONARY ANGIOGRAPHY N/A 03/20/2019   Procedure: LEFT HEART CATH AND CORONARY ANGIOGRAPHY;  Surgeon: Troy Sine, MD;  Location: [facility];  Service: Cardiovascular;  Laterality: N/A;  . LEFT HEART CATH AND CORONARY ANGIOGRAPHY N/A 05/20/2019   Procedure: LEFT HEART CATH AND CORONARY ANGIOGRAPHY;  Surgeon: Leonie Man, MD;  Location: [place];  Service: Cardiovascular;  Laterality: N/A;  . RESECTION DISTAL CLAVICAL Right 01/23/2019   Procedure: OPEN DISTAL CLAVICLE EXCISION;  Surgeon: Carole Civil, MD;  Location: [facility];  Service: Orthopedics;  Laterality: Right;  . TOOTH EXTRACTION Bilateral 12/28/2017   Procedure: DENTAL RESTORATION/EXTRACTIONS;  Surgeon: Diona Browner, DDS;  Location: [place];  Service: Oral Surgery;  Laterality: Bilateral;  Family History  Problem Relation Age of Onset  . Colon cancer Brother        in his 76s  . Heart failure Mother     Social History   Socioeconomic History  . Marital status: Legally Separated    Spouse name: Not on file  . Number of children: Not on file  . Years of education: Not on file  . Highest education level: Not on file  Occupational History  . Not on file  Tobacco Use  . Smoking status: Current Every Day Smoker    Packs/day: 0.50    Years: 29.00    Pack years: 14.50    Types: Cigarettes  . Smokeless tobacco: Never Used  Substance and Sexual Activity  . Alcohol use: Not Currently    Comment: occasional  . Drug use: No  . Sexual activity: Not on file  Other Topics Concern  . Not on file  Social History Narrative  . Not on file   Social Determinants of Health   Financial Resource Strain:   . Difficulty of Paying Living Expenses:   Food Insecurity:   . Worried About Charity fundraiser in the Last Year:   . Arboriculturist in the Last Year:   Transportation Needs:   . Film/video editor (Medical):   Marland Kitchen  Lack of Transportation (Non-Medical):   Physical Activity:   . Days of Exercise per Week:   . Minutes of Exercise per Session:   Stress:   . Feeling of Stress :   Social Connections:   . Frequency of Communication with Friends and Family:   . Frequency of Social Gatherings with Friends and Family:   . Attends Religious Services:   . Active Member of Clubs or Organizations:   . Attends Archivist Meetings:   Marland Kitchen Marital Status:   Intimate Partner Violence:   . Fear of Current or Ex-Partner:   . Emotionally Abused:   Marland Kitchen Physically Abused:   . Sexually Abused:       ROS:  General: Negative for anorexia, weight loss, fever, chills, fatigue, weakness. Eyes: Negative for vision changes.  ENT: Negative for hoarseness, difficulty swallowing , nasal congestion. CV: Negative for chest pain, angina, palpitations, dyspnea on exertion, peripheral edema.  Respiratory: Negative for dyspnea at rest, dyspnea on exertion, cough, sputum, wheezing.  GI: See history of present illness. GU:  Negative for dysuria, hematuria, urinary incontinence, urinary frequency, nocturnal urination.  MS: Negative for joint pain,+ low back pain.  Derm: Negative for rash or itching.  Neuro: Negative for weakness, abnormal sensation, seizure, frequent headaches, memory loss, confusion.  Psych: Negative for anxiety, depression, suicidal ideation, hallucinations.  Endo: Negative for unusual weight change.  Heme: Negative for bruising or bleeding. Allergy: Negative for rash or hives.    Physical Examination:  BP 108/74   Pulse 77   Temp (!) 97.1 F (36.2 C) (Oral)   Ht 6\' 2"  (1.88 m)   Wt 233 lb (105.7 kg)   BMI 29.92 kg/m    General: Well-nourished, well-developed in no acute distress.  Head: Normocephalic, atraumatic.   Eyes: Conjunctiva pink, no icterus. Mouth: masked. Neck: Supple without thyromegaly, masses, or lymphadenopathy.  Lungs: Clear to auscultation bilaterally.  Heart: Regular rate  and rhythm, no murmurs rubs or gallops.  Abdomen: Bowel sounds are normal, nontender, nondistended, no hepatosplenomegaly or masses, no abdominal bruits or    hernia , no rebound or guarding.   Rectal: patient refused Extremities: No  lower extremity edema. No clubbing or deformities.  Neuro: Alert and oriented x 4 , grossly normal neurologically.  Skin: Warm and dry, no rash or jaundice.   Psych: Alert and cooperative, normal mood and affect.  Labs: Lab Results  Component Value Date   CREATININE 1.36 (H) 05/21/2019   BUN 17 05/21/2019   NA 140 05/21/2019   K 4.6 05/21/2019   CL 109 05/21/2019   CO2 23 05/21/2019   Lab Results  Component Value Date   ALT 20 05/20/2019   AST 55 (H) 05/20/2019   ALKPHOS 72 05/20/2019   BILITOT 0.4 05/20/2019   Lab Results  Component Value Date   WBC 16.8 (H) 05/20/2019   HGB 12.0 (L) 05/20/2019   HCT 36.6 (L) 05/20/2019   MCV 92.7 05/20/2019   PLT 283 05/20/2019   Lab Results  Component Value Date   TSH 2.700 05/20/2019    No results found for: FOLATE No results found for: IRON, TIBC, FERRITIN No results found for: VITAMINB12  Imaging Studies: No results found.  Impression/Plan:  Pleasant 59 year old gentleman with family history of colon cancer (brother in his 55s), chronic intermittent fecal incontinence, relatively new normocytic anemia first noted last fall presenting for further evaluation.  Several year history of intermittent fecal incontinence/soiling at times, work-up has been incomplete due to patient compliance.  He was scheduled for colonoscopy in 2019 to evaluate symptoms as well as for high risk rating colonoscopy due to family history of colon cancer, but patient canceled.  He states he did not want to take the bowel prep.  In fact in 2012 he presented unprepped for colonoscopy which had to be aborted.  He states that he is tired of dealing with fecal incontinence and wants to have a colonoscopy.  Some of his fecal soiling  may be related to internal hemorrhoids.  At least some of his incontinence is related to urgency and inability to get to a bathroom on time.  However what is alarming is for fecal incontinence noted while sitting around watching TV without warning.  He has a lot of back issues.  Does have some rectal pain.  Recommend colonoscopy for evaluation of above symptoms.  He will need deep sedation given his multiple comorbidities.  Given recent cardiac issues, NSTEMI (05/2019) and STEMI (03/2019) from possible embolic source and now on Brilinta/ASA, he will need cardiac clearance prior to scheduling colonoscopy. We will reach out to his cardiologist. Once clearance obtained, we will pursue procedure.   In the interim, consider Imodium 2 mg 1-2 times daily especially on days that he has frequent stools or needs to be away from home for period of time.  Also will start Anusol cream and rectally twice daily for 14 days for treatment of likely hemorrhoids based on his clinical symptoms (patient would not allow digital rectal exam today).

## 2019-08-08 NOTE — Patient Instructions (Signed)
Minor leakage of stool sometimes can be due to internal hemorrhoids. While we wait for cardiac clearance to get your colonoscopy done, let's try anusol cream (apply twice per day just inside anal opening) for 2 weeks.   For days you are going away from home longer or are having increased bowel movements, you can take imodium 2mg  up to twice per day (buy over the counter).  Once we have cardiac clearance for colonoscopy, we will get you scheduled.

## 2019-08-12 ENCOUNTER — Telehealth: Payer: Self-pay | Admitting: Emergency Medicine

## 2019-08-12 NOTE — Telephone Encounter (Signed)
Request sent to cardiologist Levell July  for clearance to due a colonoscopy

## 2019-08-12 NOTE — Telephone Encounter (Signed)
-----   Message from Verta Ellen., NP sent at 08/12/2019 10:33 AM EDT ----- Regarding: RE: clearance He is clear to have diagnostic colonoscopy but must hold Brilinta 5 days prior to procedure and must restart afterward. He must stay on ASA. He is to resume Brilinta after procedure assuming no complications. Thanks  ----- Message ----- From: Luanne Bras, CMA Sent: 08/12/2019   8:02 AM EDT To: Verta Ellen., NP Subject: clearance                                      Hello sorry last message was not complete. Our provider Neil Crouch would like cardiac clearance on pt  Given recent cardiac issues, NSTEMI (05/2019) and STEMI (03/2019) from possible embolic source and now on Brilinta/ASA, he will need cardiac clearance prior to scheduling colonoscopy.

## 2019-08-14 ENCOUNTER — Telehealth: Payer: Self-pay | Admitting: Internal Medicine

## 2019-08-14 NOTE — Telephone Encounter (Signed)
Called pt. Made aware we did not refer to cardiology. We were getting clearance for TCS from cardiology. Patient also aware we are awaiting august schedule to book for procedures

## 2019-08-14 NOTE — Telephone Encounter (Signed)
PATIENT CALLED AND SAID THAT HE WAS SEEN AND WE WERE GONG TO REFER HIM TO A CARDIOLOGIST FOR A TEST TO BE DONE. HE HAS NOT HEARD ANYTHING ABOUT THAT AND IS CONCERNED

## 2019-08-15 ENCOUNTER — Ambulatory Visit (INDEPENDENT_AMBULATORY_CARE_PROVIDER_SITE_OTHER): Payer: Medicare Other | Admitting: Orthopedic Surgery

## 2019-08-15 ENCOUNTER — Other Ambulatory Visit: Payer: Self-pay

## 2019-08-15 ENCOUNTER — Ambulatory Visit: Payer: Medicare Other

## 2019-08-15 VITALS — BP 136/84 | HR 89 | Temp 97.1°F | Ht 74.0 in | Wt 233.0 lb

## 2019-08-15 DIAGNOSIS — G8929 Other chronic pain: Secondary | ICD-10-CM

## 2019-08-15 DIAGNOSIS — M25511 Pain in right shoulder: Secondary | ICD-10-CM

## 2019-08-15 NOTE — Progress Notes (Signed)
Chief Complaint  Patient presents with  . Follow-up    Recheck on right shoulder, DOS 01-23-19.    58 status post distal clavicle excision open technique presents back after being evaluated for popping and feeling that the shoulder was popping out of place.  Patient got home exercise program but says it still pops feels like is not in the right position  I repeated his x-ray concentrating on his [facility] joint.  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  Encounter Diagnoses  Name Primary?  . Chronic right shoulder pain Yes  . Arthralgia of right acromioclavicular joint

## 2019-08-21 ENCOUNTER — Ambulatory Visit: Payer: Medicare Other | Admitting: Cardiology

## 2019-08-21 DIAGNOSIS — M545 Low back pain: Secondary | ICD-10-CM | POA: Diagnosis not present

## 2019-08-21 DIAGNOSIS — M25511 Pain in right shoulder: Secondary | ICD-10-CM | POA: Diagnosis not present

## 2019-08-21 DIAGNOSIS — M12819 Other specific arthropathies, not elsewhere classified, unspecified shoulder: Secondary | ICD-10-CM | POA: Diagnosis not present

## 2019-08-26 DIAGNOSIS — M5416 Radiculopathy, lumbar region: Secondary | ICD-10-CM | POA: Diagnosis not present

## 2019-08-26 DIAGNOSIS — M545 Low back pain: Secondary | ICD-10-CM | POA: Diagnosis not present

## 2019-08-26 DIAGNOSIS — M25511 Pain in right shoulder: Secondary | ICD-10-CM | POA: Diagnosis not present

## 2019-08-26 DIAGNOSIS — M79609 Pain in unspecified limb: Secondary | ICD-10-CM | POA: Diagnosis not present

## 2019-08-26 DIAGNOSIS — I219 Acute myocardial infarction, unspecified: Secondary | ICD-10-CM | POA: Diagnosis not present

## 2019-08-27 DIAGNOSIS — Z79891 Long term (current) use of opiate analgesic: Secondary | ICD-10-CM | POA: Insufficient documentation

## 2019-08-27 NOTE — Telephone Encounter (Signed)
Noted. Called patient and offered procedure date 5/27. Declined stating he has a cardiac procedure that day. I advised patient will call with another date if one happens to come open before we receive our august schedule. He voiced understanding.

## 2019-08-27 NOTE — Telephone Encounter (Signed)
OK to schedule colonoscopy with propofol for [id number] incontinence, normocytic anemia, FH CRC in brother before age of 32.  Hold imodium 3 days before procedure.  Hold iron 7 days before procedure.  We do not need him to hold Brilinta or ASA for procedure.  See separate message, cardiac clearance to have colonoscopy has been provided.

## 2019-08-29 DIAGNOSIS — N182 Chronic kidney disease, stage 2 (mild): Secondary | ICD-10-CM | POA: Diagnosis not present

## 2019-08-29 DIAGNOSIS — S3992XA Unspecified injury of lower back, initial encounter: Secondary | ICD-10-CM | POA: Diagnosis not present

## 2019-08-29 DIAGNOSIS — M549 Dorsalgia, unspecified: Secondary | ICD-10-CM | POA: Diagnosis not present

## 2019-08-29 DIAGNOSIS — M79602 Pain in left arm: Secondary | ICD-10-CM | POA: Diagnosis not present

## 2019-08-29 DIAGNOSIS — Z96643 Presence of artificial hip joint, bilateral: Secondary | ICD-10-CM | POA: Diagnosis not present

## 2019-08-29 DIAGNOSIS — M5136 Other intervertebral disc degeneration, lumbar region: Secondary | ICD-10-CM | POA: Diagnosis not present

## 2019-08-29 DIAGNOSIS — I2510 Atherosclerotic heart disease of native coronary artery without angina pectoris: Secondary | ICD-10-CM | POA: Diagnosis not present

## 2019-08-29 DIAGNOSIS — G8929 Other chronic pain: Secondary | ICD-10-CM | POA: Diagnosis not present

## 2019-09-01 ENCOUNTER — Telehealth: Payer: Self-pay | Admitting: *Deleted

## 2019-09-01 NOTE — Telephone Encounter (Signed)
Called pt and is aware pre-op/covid test appt scheduled for 6/1 at 1:30pm.

## 2019-09-01 NOTE — Telephone Encounter (Signed)
Called pt. Offered procedure date for TCS with propofol with RMR 6/3 at 10:00am and patient was agreeable. Aware he will need a covid/pre-op appt prior. Advised will mail prep instructions to him.

## 2019-09-02 NOTE — Discharge Instructions (Signed)
Michael [name]  09/02/2019      [facility] - [place], [place] - [address] [place] [address] Phone: [phone] Fax: [phone]    Your procedure is scheduled on 09/11/19  Report to [facility] at 23 A.M.  Call this number if you have problems the morning of surgery:  [phone]   Remember:             FOLLOW ALL INSTRUCTIONS GIVEN TO YOU BY DR Roseanne Kaufman OFFICE    Take these medicines the morning of surgery with A SIP OF WATER Albuterol neb, Lisinopril Toprol XL, Lyrica STOP Brilinta 5 days prior to procedure    Do not wear jewelry, make-up or nail polish.  Do not wear lotions, powders, or perfumes, or deodorant.  Do not shave 48 hours prior to surgery.  Men may shave face and neck.  Do not bring valuables to the hospital.  [facility] is not responsible for any belongings or valuables.  Contacts, dentures or bridgework may not be worn into surgery.  Leave your suitcase in the car.  After surgery it may be brought to your room.  For patients admitted to the hospital, discharge time will be determined by your treatment team.  Patients discharged the day of surgery will not be allowed to drive home.    Please read over the following fact sheets that you were given. Anesthesia Post-op Instructions     PATIENT INSTRUCTIONS POST-ANESTHESIA  IMMEDIATELY FOLLOWING SURGERY:  Do not drive or operate machinery for the first twenty four hours after surgery.  Do not make any important decisions for twenty four hours after surgery or while taking narcotic pain medications or sedatives.  If you develop intractable nausea and vomiting or a severe headache please notify your doctor immediately.  FOLLOW-UP:  Please make an appointment with your surgeon as instructed. You do not need to follow up with anesthesia unless specifically instructed to do so.  WOUND CARE INSTRUCTIONS (if applicable):  Keep a dry clean dressing on the anesthesia/puncture wound site if  there is drainage.  Once the wound has quit draining you may leave it open to air.  Generally you should leave the bandage intact for twenty four hours unless there is drainage.  If the epidural site drains for more than 36-48 hours please call the anesthesia department.  QUESTIONS?:  Please feel free to call your physician or the hospital operator if you have any questions, and they will be happy to assist you.      Colonoscopy, Adult A colonoscopy is a procedure to look at the entire large intestine. This procedure is done using a long, thin, flexible tube that has a camera on the end. You may have a colonoscopy:  As a part of normal colorectal screening.  If you have certain symptoms, such as: ? A low number of red blood cells in your blood (anemia). ? Diarrhea that does not go away. ? Pain in your abdomen. ? Blood in your stool. A colonoscopy can help screen for and diagnose medical problems, including:  Tumors.  Extra tissue that grows where mucus forms (polyps).  Inflammation.  Areas of bleeding. Tell your health care provider about:  Any allergies you have.  All medicines you are taking, including vitamins, herbs, eye drops, creams, and over-the-counter medicines.  Any problems you or family members have had with anesthetic medicines.  Any blood disorders you have.  Any surgeries you have had.  Any medical  conditions you have.  Any problems you have had with having bowel movements.  Whether you are pregnant or may be pregnant. What are the risks? Generally, this is a safe procedure. However, problems may occur, including:  Bleeding.  Damage to your intestine.  Allergic reactions to medicines given during the procedure.  Infection. This is rare. What happens before the procedure? Eating and drinking restrictions Follow instructions from your health care provider about eating or drinking restrictions, which may include:  A few days before the  procedure: ? Follow a low-fiber diet. ? Avoid nuts, seeds, dried fruit, raw fruits, and vegetables.  1-3 days before the procedure: ? Eat only gelatin dessert or ice pops. ? Drink only clear liquids, such as water, clear juice, clear broth or bouillon, black coffee or tea, or clear soft drinks or sports drinks. ? Avoid liquids that contain red or purple dye.  The day of the procedure: ? Do not eat solid foods. You may continue to drink clear liquids until up to 2 hours before the procedure. ? Do not eat or drink anything starting 2 hours before the procedure, or within the time period that your health care provider recommends. Bowel prep If you were prescribed a bowel prep to take by mouth (orally) to clean out your colon:  Take it as told by your health care provider. Starting the day before your procedure, you will need to drink a large amount of liquid medicine. The liquid will cause you to have many bowel movements of loose stool until your stool becomes almost clear or light green.  If your skin or the opening between the buttocks (anus) gets irritated from diarrhea, you may relieve the irritation using: ? Wipes with medicine in them, such as adult wet wipes with aloe and vitamin E. ? A product to soothe skin, such as petroleum jelly.  If you vomit while drinking the bowel prep: ? Take a break for up to 60 minutes. ? Begin the bowel prep again. ? Call your health care provider if you keep vomiting or you cannot take the bowel prep without vomiting.  To clean out your colon, you may also be given: ? Laxative medicines. These help you have a bowel movement. ? Instructions for enema use. An enema is liquid medicine injected into your rectum. Medicines Ask your health care provider about:  Changing or stopping your regular medicines or supplements. This is especially important if you are taking iron supplements, diabetes medicines, or blood thinners.  Taking medicines such as  aspirin and ibuprofen. These medicines can thin your blood. Do not take these medicines unless your health care provider tells you to take them.  Taking over-the-counter medicines, vitamins, herbs, and supplements. General instructions  Ask your health care provider what steps will be taken to help prevent infection. These may include washing skin with a germ-killing soap.  Plan to have someone take you home from the hospital or clinic. What happens during the procedure?   An IV will be inserted into one of your veins.  You may be given one or more of the following: ? A medicine to help you relax (sedative). ? A medicine to numb the area (local anesthetic). ? A medicine to make you fall asleep (general anesthetic). This is rarely needed.  You will lie on your side with your knees bent.  The tube will: ? Have oil or gel put on it (be lubricated). ? Be inserted into your anus. ? Be gently eased through  all parts of your large intestine.  Air will be sent into your colon to keep it open. This may cause some pressure or cramping.  Images will be taken with the camera and will appear on a screen.  A small tissue sample may be removed to be looked at under a microscope (biopsy). The tissue may be sent to a lab for testing if any signs of problems are found.  If small polyps are found, they may be removed and checked for cancer cells.  When the procedure is finished, the tube will be removed. The procedure may vary among health care providers and hospitals. What happens after the procedure?  Your blood pressure, heart rate, breathing rate, and blood oxygen level will be monitored until you leave the hospital or clinic.  You may have a small amount of blood in your stool.  You may pass gas and have mild cramping or bloating in your abdomen. This is caused by the air that was used to open your colon during the exam.  Do not drive for 24 hours after the procedure.  It is up to you  to get the results of your procedure. Ask your health care provider, or the department that is doing the procedure, when your results will be ready. Summary  A colonoscopy is a procedure to look at the entire large intestine.  Follow instructions from your health care provider about eating and drinking before the procedure.  If you were prescribed an oral bowel prep to clean out your colon, take it as told by your health care provider.  During the colonoscopy, a flexible tube with a camera on its end is inserted into the anus and then passed into the other parts of the large intestine. This information is not intended to replace advice given to you by your health care provider. Make sure you discuss any questions you have with your health care provider. Document Revised: 10/18/2018 Document Reviewed: 10/18/2018 Elsevier Patient Education  [place].

## 2019-09-03 NOTE — Patient Instructions (Signed)
Michael [name]  09/03/2019     @PREFPERIOPPHARMACY @   Your procedure is scheduled on  09/11/2019 .  Report to [facility] at  0830  A.M.  Call this number if you have problems the morning of surgery:  [phone]   Remember:  Follow the diet and prep instructions given to you by Dr Roseanne Kaufman office.                     Take these medicines the morning of surgery with A SIP OF WATER  Metoprolol, lyrica. Use your nebulizer before you come.  Make sure your last dose of immodium is on 09-08-2019 and your last dose of iron is on 09/04/2019.    Do not wear jewelry, make-up or nail polish.  Do not wear lotions, powders, or perfumes. Please wear deodorant and brush your teeth.  Do not shave 48 hours prior to surgery.  Men may shave face and neck.  Do not bring valuables to the hospital.  [facility] is not responsible for any belongings or valuables.  Contacts, dentures or bridgework may not be worn into surgery.  Leave your suitcase in the car.  After surgery it may be brought to your room.  For patients admitted to the hospital, discharge time will be determined by your treatment team.  Patients discharged the day of surgery will not be allowed to drive home.   Name and phone number of your driver:   family Special instructions:  DO NOT smoke the morning of your procedure.  Please read over the following fact sheets that you were given. Anesthesia Post-op Instructions and Care and Recovery After Surgery       Colonoscopy, Adult, Care After This sheet gives you information about how to care for yourself after your procedure. Your health care provider may also give you more specific instructions. If you have problems or questions, contact your health care provider. What can I expect after the procedure? After the procedure, it is common to have:  A small amount of blood in your stool for 24 hours after the procedure.  Some gas.  Mild cramping or bloating of your  abdomen. Follow these instructions at home: Eating and drinking   Drink enough fluid to keep your urine pale yellow.  Follow instructions from your health care provider about eating or drinking restrictions.  Resume your normal diet as instructed by your health care provider. Avoid heavy or fried foods that are hard to digest. Activity  Rest as told by your health care provider.  Avoid sitting for a long time without moving. Get up to take short walks every 1-2 hours. This is important to improve blood flow and breathing. Ask for help if you feel weak or unsteady.  Return to your normal activities as told by your health care provider. Ask your health care provider what activities are safe for you. Managing cramping and bloating   Try walking around when you have cramps or feel bloated.  Apply heat to your abdomen as told by your health care provider. Use the heat source that your health care provider recommends, such as a moist heat pack or a heating pad. ? Place a towel between your skin and the heat source. ? Leave the heat on for 20-30 minutes. ? Remove the heat if your skin turns bright red. This is especially important if you are unable to feel pain, heat, or cold. You may have a greater  risk of getting burned. General instructions  For the first 24 hours after the procedure: ? Do not drive or use machinery. ? Do not sign important documents. ? Do not drink alcohol. ? Do your regular daily activities at a slower pace than normal. ? Eat soft foods that are easy to digest.  Take over-the-counter and prescription medicines only as told by your health care provider.  Keep all follow-up visits as told by your health care provider. This is important. Contact a health care provider if:  You have blood in your stool 2-3 days after the procedure. Get help right away if you have:  More than a small spotting of blood in your stool.  Large blood clots in your stool.  Swelling  of your abdomen.  Nausea or vomiting.  A fever.  Increasing pain in your abdomen that is not relieved with medicine. Summary  After the procedure, it is common to have a small amount of blood in your stool. You may also have mild cramping and bloating of your abdomen.  For the first 24 hours after the procedure, do not drive or use machinery, sign important documents, or drink alcohol.  Get help right away if you have a lot of blood in your stool, nausea or vomiting, a fever, or increased pain in your abdomen. This information is not intended to replace advice given to you by your health care provider. Make sure you discuss any questions you have with your health care provider. Document Revised: 10/21/2018 Document Reviewed: 10/21/2018 Elsevier Patient Education  [employer] After These instructions provide you with information about caring for yourself after your procedure. Your health care provider may also give you more specific instructions. Your treatment has been planned according to current medical practices, but problems sometimes occur. Call your health care provider if you have any problems or questions after your procedure. What can I expect after the procedure? After your procedure, you may:  Feel sleepy for several hours.  Feel clumsy and have poor balance for several hours.  Feel forgetful about what happened after the procedure.  Have poor judgment for several hours.  Feel nauseous or vomit.  Have a sore throat if you had a breathing tube during the procedure. Follow these instructions at home: For at least 24 hours after the procedure:      Have a responsible adult stay with you. It is important to have someone help care for you until you are awake and alert.  Rest as needed.  Do not: ? Participate in activities in which you could fall or become injured. ? Drive. ? Use heavy machinery. ? Drink alcohol. ? Take  sleeping pills or medicines that cause drowsiness. ? Make important decisions or sign legal documents. ? Take care of children on your own. Eating and drinking  Follow the diet that is recommended by your health care provider.  If you vomit, drink water, juice, or soup when you can drink without vomiting.  Make sure you have little or no nausea before eating solid foods. General instructions  Take over-the-counter and prescription medicines only as told by your health care provider.  If you have sleep apnea, surgery and certain medicines can increase your risk for breathing problems. Follow instructions from your health care provider about wearing your sleep device: ? Anytime you are sleeping, including during daytime naps. ? While taking prescription pain medicines, sleeping medicines, or medicines that make you drowsy.  If you smoke,  do not smoke without supervision.  Keep all follow-up visits as told by your health care provider. This is important. Contact a health care provider if:  You keep feeling nauseous or you keep vomiting.  You feel light-headed.  You develop a rash.  You have a fever. Get help right away if:  You have trouble breathing. Summary  For several hours after your procedure, you may feel sleepy and have poor judgment.  Have a responsible adult stay with you for at least 24 hours or until you are awake and alert. This information is not intended to replace advice given to you by your health care provider. Make sure you discuss any questions you have with your health care provider. Document Revised: 06/25/2017 Document Reviewed: 07/18/2015 Elsevier Patient Education  [place].

## 2019-09-04 ENCOUNTER — Ambulatory Visit (INDEPENDENT_AMBULATORY_CARE_PROVIDER_SITE_OTHER): Payer: Medicare Other

## 2019-09-04 ENCOUNTER — Other Ambulatory Visit: Payer: Self-pay

## 2019-09-04 ENCOUNTER — Ambulatory Visit: Payer: Medicare Other | Admitting: Cardiology

## 2019-09-04 DIAGNOSIS — I739 Peripheral vascular disease, unspecified: Secondary | ICD-10-CM | POA: Diagnosis not present

## 2019-09-05 DIAGNOSIS — M799 Soft tissue disorder, unspecified: Secondary | ICD-10-CM | POA: Diagnosis not present

## 2019-09-05 DIAGNOSIS — M25511 Pain in right shoulder: Secondary | ICD-10-CM | POA: Diagnosis not present

## 2019-09-05 DIAGNOSIS — Z7409 Other reduced mobility: Secondary | ICD-10-CM | POA: Diagnosis not present

## 2019-09-05 DIAGNOSIS — M25611 Stiffness of right shoulder, not elsewhere classified: Secondary | ICD-10-CM | POA: Diagnosis not present

## 2019-09-05 DIAGNOSIS — M6281 Muscle weakness (generalized): Secondary | ICD-10-CM | POA: Diagnosis not present

## 2019-09-09 ENCOUNTER — Other Ambulatory Visit: Payer: Self-pay

## 2019-09-09 ENCOUNTER — Encounter (HOSPITAL_COMMUNITY): Payer: Self-pay

## 2019-09-09 ENCOUNTER — Encounter (HOSPITAL_COMMUNITY)
Admission: RE | Admit: 2019-09-09 | Discharge: 2019-09-09 | Disposition: A | Discharge status: Home / Self Care | Payer: Medicare Other | Source: Ambulatory Visit | Attending: Internal Medicine | Admitting: Internal Medicine

## 2019-09-09 ENCOUNTER — Other Ambulatory Visit (HOSPITAL_COMMUNITY)
Admission: RE | Admit: 2019-09-09 | Discharge: 2019-09-09 | Disposition: A | Discharge status: Home / Self Care | Payer: Medicare Other | Source: Ambulatory Visit | Attending: Internal Medicine | Admitting: Internal Medicine

## 2019-09-09 DIAGNOSIS — Z20822 Contact with and (suspected) exposure to covid-19: Secondary | ICD-10-CM | POA: Insufficient documentation

## 2019-09-09 DIAGNOSIS — Z01812 Encounter for preprocedural laboratory examination: Secondary | ICD-10-CM | POA: Diagnosis not present

## 2019-09-09 LAB — BASIC METABOLIC PANEL
Anion gap: 10 (ref 5–15)
BUN: 23 mg/dL — ABNORMAL HIGH (ref 6–20)
CO2: 23 mmol/L (ref 22–32)
Calcium: 9.3 mg/dL (ref 8.9–10.3)
Chloride: 102 mmol/L (ref 98–111)
Creatinine, Ser: 2.11 mg/dL — ABNORMAL HIGH (ref 0.61–1.24)
GFR calc Af Amer: 39 mL/min — ABNORMAL LOW (ref >60)
GFR calc non Af Amer: 34 mL/min — ABNORMAL LOW (ref >60)
Glucose, Bld: 92 mg/dL (ref 70–99)
Potassium: 4 mmol/L (ref 3.5–5.1)
Sodium: 135 mmol/L (ref 135–145)

## 2019-09-09 LAB — SARS CORONAVIRUS 2 (TAT 6-24 HRS): SARS Coronavirus 2: NEGATIVE

## 2019-09-09 NOTE — Pre-Procedure Instructions (Signed)
Dr Charna Elizabeth aware of creatine and okay to proceed.

## 2019-09-09 NOTE — Pre-Procedure Instructions (Signed)
BMet routed to PCP. 

## 2019-09-10 ENCOUNTER — Telehealth: Payer: Self-pay | Admitting: *Deleted

## 2019-09-10 NOTE — Telephone Encounter (Signed)
Patient informed. Copy sent to PCP °

## 2019-09-10 NOTE — Telephone Encounter (Signed)
-----   Message from Verta Ellen., NP sent at 09/08/2019  8:42 PM EDT ----- Please call the patient and let him know the lower extremity showed no evidence of arterial disease in his legs. Tell him the the leg pain and numbness is likely from something going on in his lower back and may need to go to a back doctor if symptoms continue. Thanks

## 2019-09-11 ENCOUNTER — Other Ambulatory Visit: Payer: Self-pay

## 2019-09-11 ENCOUNTER — Ambulatory Visit (HOSPITAL_COMMUNITY)
Admission: RE | Admit: 2019-09-11 | Discharge: 2019-09-11 | Disposition: A | Discharge status: Home / Self Care | Payer: Medicare Other | Attending: Internal Medicine | Admitting: Internal Medicine

## 2019-09-11 ENCOUNTER — Encounter (HOSPITAL_COMMUNITY)
Admission: RE | Disposition: A | Discharge status: Home / Self Care | Payer: Self-pay | Source: Home / Self Care | Attending: Internal Medicine

## 2019-09-11 ENCOUNTER — Telehealth: Payer: Self-pay | Admitting: *Deleted

## 2019-09-11 ENCOUNTER — Encounter (HOSPITAL_COMMUNITY): Payer: Self-pay | Admitting: Anesthesiology

## 2019-09-11 ENCOUNTER — Ambulatory Visit: Payer: Medicare Other | Admitting: Cardiology

## 2019-09-11 ENCOUNTER — Encounter (HOSPITAL_COMMUNITY): Payer: Self-pay | Admitting: Internal Medicine

## 2019-09-11 DIAGNOSIS — Z5309 Procedure and treatment not carried out because of other contraindication: Secondary | ICD-10-CM | POA: Diagnosis not present

## 2019-09-11 SURGERY — COLONOSCOPY WITH PROPOFOL
Anesthesia: Monitor Anesthesia Care

## 2019-09-11 MED ORDER — CHLORHEXIDINE GLUCONATE CLOTH 2 % EX PADS: 6.0000 | MEDICATED_PAD | Freq: Once | CUTANEOUS | Status: DC

## 2019-09-11 NOTE — Telephone Encounter (Signed)
OR Nursing by Gean Birchwood, RN at 09/11/2019 8:42 AM Version 1 of 1 Author: Gean Birchwood, RN Service: -- Author Type: Registered Nurse  Filed: 09/11/2019 8:46 AM Date of Service: 09/11/2019 8:42 AM Status: Signed  Editor: Gean Birchwood, RN (Registered Nurse)    [name] came in for procedure this morning and stated he did now take any prep prior to his arrival. He said he was not given any to take. He also stated he had eaten chicken the night before at around 1530 and had a soft drink the morning of his procedure.  Charge nurse Michael [name] talked with Michael [name] about patient not having prep and eating. Michael [name] said the procedure would need to be rescheduled and for Michael [name] to go by his office after he left to regroup and get a plan to reschedule.     ---  Patient aware will call to r/s once we receive aug/sept schedule for TCS with propofol

## 2019-09-11 NOTE — OR Nursing (Signed)
Michael [name] came in for procedure this morning and stated he did now take any prep prior to his arrival. He said he was not given any to take. He also stated he had eaten chicken the night before at around 1530 and had a soft drink the morning of his procedure.  Charge nurse Abbie Sons talked with Dr. Gala Romney about patient not having prep and eating. Dr. Gala Romney said the procedure would need to be rescheduled and for [name] to go by his office after he left to regroup and get a plan to reschedule.

## 2019-09-13 DIAGNOSIS — R58 Hemorrhage, not elsewhere classified: Secondary | ICD-10-CM | POA: Diagnosis not present

## 2019-09-14 DIAGNOSIS — S0510XA Contusion of eyeball and orbital tissues, unspecified eye, initial encounter: Secondary | ICD-10-CM | POA: Diagnosis not present

## 2019-09-14 DIAGNOSIS — S0292XA Unspecified fracture of facial bones, initial encounter for closed fracture: Secondary | ICD-10-CM | POA: Diagnosis not present

## 2019-09-14 DIAGNOSIS — S0240CA Maxillary fracture, right side, initial encounter for closed fracture: Secondary | ICD-10-CM | POA: Diagnosis not present

## 2019-09-14 DIAGNOSIS — I2510 Atherosclerotic heart disease of native coronary artery without angina pectoris: Secondary | ICD-10-CM | POA: Diagnosis not present

## 2019-09-14 DIAGNOSIS — Z23 Encounter for immunization: Secondary | ICD-10-CM | POA: Diagnosis not present

## 2019-09-14 DIAGNOSIS — K219 Gastro-esophageal reflux disease without esophagitis: Secondary | ICD-10-CM | POA: Diagnosis not present

## 2019-09-14 DIAGNOSIS — I252 Old myocardial infarction: Secondary | ICD-10-CM | POA: Diagnosis not present

## 2019-09-14 DIAGNOSIS — D869 Sarcoidosis, unspecified: Secondary | ICD-10-CM | POA: Diagnosis not present

## 2019-09-14 DIAGNOSIS — S0990XA Unspecified injury of head, initial encounter: Secondary | ICD-10-CM | POA: Diagnosis not present

## 2019-09-14 DIAGNOSIS — N182 Chronic kidney disease, stage 2 (mild): Secondary | ICD-10-CM | POA: Diagnosis not present

## 2019-09-14 DIAGNOSIS — S0240DA Maxillary fracture, left side, initial encounter for closed fracture: Secondary | ICD-10-CM | POA: Diagnosis not present

## 2019-09-22 ENCOUNTER — Ambulatory Visit: Payer: Medicare Other | Admitting: Cardiology

## 2019-09-22 NOTE — Progress Notes (Deleted)
Clinical Summary Michael [name] is a 59 y.o.male seen today for follow up of the following medical problems.  1. History of embolic STEMI - admitted 03/2019 with STEMI - cathcatho showed occluded distal LAD and OM1, probable emoblic occlusion. Spontaneous reprfusion of LAD, low level PTCA of distal LCX - 03/2019 echo LVEF 60-65%, normal RV - unclear source of emboli. No significaint finding by echo. No afib has been noted this admission  - walking regularly, up to 4-5 miles without troubles. - no recent chest pain, no DOE - compliant with meds.   05/2019 admitted again with chest pain. Trop up to 5300 -  cath 05/2019 showed no significant disease. LV gram 30-35% - 05/2019 cardiac MRI: LVEF 56%, inferolateral hypokinesis with delayed enhancement consistent with scar.   2. History of sarcoid - 05/2019 cardiac MRI without signs of cardiac involvement  3. Chronic pain - followed in pain clinic - has appt with Dr Arsenio Katz coming up.    4. Dizziness - episode Monday morning. - felt dizzy, reports head dropped. Had foot on breaks, ran into wall - isolated episode. - dizzy with standing. Water bottles x 2, occasional coffee, mountain cans x 3-4, orange juice x 2 glasses, no EtOH.      Past Medical History:  Diagnosis Date   Allergic rhinitis    Arthritis    Chronic low back pain    follwed in Pain Clinic(Dr.Kirchmayer)   CKD (chronic kidney disease), stage II    Depression    Depression with anxiety    GERD (gastroesophageal reflux disease)    History of kidney stones    passed   HLD (hyperlipidemia) 03/23/2019   Insomnia    Migraines    NSTEMI (non-ST elevated myocardial infarction) ([place])    x2   Sarcoidosis    Multisystem,pulmonary and hepatic (Dr.Rourk)Liver bx in 06 mildly postive AMA but on re-check normal   Sleep apnea    "tested, but said it wasnt bad enough for a machine".     Allergies  Allergen Reactions   Methadone Other  (See Comments)    REACTION: "Become mentally unstable"     Current Outpatient Medications  Medication Sig Dispense Refill   acetaminophen (TYLENOL) 325 MG tablet Take 2 tablets (650 mg total) by mouth every 4 (four) hours as needed for headache or mild pain.     albuterol (PROVENTIL) (2.5 MG/3ML) 0.083% nebulizer solution Take 2.5 mg by nebulization every 6 (six) hours as needed for wheezing or shortness of breath.      atorvastatin (LIPITOR) 80 MG tablet TAKE ONE TABLET BY MOUTH DAILY AT 6 PM. (Patient taking differently: Take 80 mg by mouth daily. ) 30 tablet 6   hydrochlorothiazide (MICROZIDE) 12.5 MG capsule Take 12.5 mg by mouth daily.     hydrocortisone (ANUSOL-HC) 2.5 % rectal cream Place 1 application rectally 2 (two) times daily. For 14 days 30 g 1   lisinopril (ZESTRIL) 5 MG tablet Take 1 tablet (5 mg total) by mouth daily. 30 tablet 6   metoprolol succinate (TOPROL-XL) 25 MG 24 hr tablet Take 0.5 tablets (12.5 mg total) by mouth daily. 15 tablet 6   nitroGLYCERIN (NITROSTAT) 0.4 MG SL tablet DISSOLVE ONE TABLET UNDER TONGUE EVERY 5 MINUTES UP TO 3 DOSES AS NEEDED FOR CHEST PAIN. 25 tablet 4   pregabalin (LYRICA) 200 MG capsule Take 200 mg by mouth 3 (three) times daily.      ticagrelor (BRILINTA) 90 MG TABS tablet Take 1 tablet (  90 mg total) by mouth 2 (two) times daily. 60 tablet 11   zolpidem (AMBIEN) 5 MG tablet Take 5 mg by mouth at bedtime.     No current facility-administered medications for this visit.     Past Surgical History:  Procedure Laterality Date   BACK SURGERY  2012   2nd back surgery 3 monthes ago and in 2007   [place]  05/20/2019   COLONOSCOPY  11/05/2003   [id number]:[id number] internal hemorrhoids, otherwise normal rectum and colon   CORONARY/GRAFT ACUTE MI REVASCULARIZATION N/A 03/20/2019   Procedure: Coronary/Graft Acute MI Revascularization;  Surgeon: Troy Sine, MD;  Location: [place] [facility] [facility];  Service: Cardiovascular;   Laterality: N/A;   ESOPHAGOGASTRODUODENOSCOPY  03/22/2004   KXF:GHWEXH esophagogastroduodenoscopy. Small hiatal hernia otherwise normal stomach   HARVEST BONE GRAFT Left    thigh   HIP ARTHROPLASTY Bilateral    left 2017, right 2016   HIP SURGERY Left 2017   bone graft   LAMINECTOMY  2009   Balateral L4-L5   LEFT HEART CATH AND CORONARY ANGIOGRAPHY N/A 03/20/2019   Procedure: LEFT HEART CATH AND CORONARY ANGIOGRAPHY;  Surgeon: Troy Sine, MD;  Location: [facility];  Service: Cardiovascular;  Laterality: N/A;   LEFT HEART CATH AND CORONARY ANGIOGRAPHY N/A 05/20/2019   Procedure: LEFT HEART CATH AND CORONARY ANGIOGRAPHY;  Surgeon: Leonie Man, MD;  Location: [place] [facility] [facility];  Service: Cardiovascular;  Laterality: N/A;   RESECTION DISTAL CLAVICAL Right 01/23/2019   Procedure: OPEN DISTAL CLAVICLE EXCISION;  Surgeon: Carole Civil, MD;  Location: [facility];  Service: Orthopedics;  Laterality: Right;   TOOTH EXTRACTION Bilateral 12/28/2017   Procedure: DENTAL RESTORATION/EXTRACTIONS;  Surgeon: Diona Browner, DDS;  Location: [facility];  Service: Oral Surgery;  Laterality: Bilateral;     Allergies  Allergen Reactions   Methadone Other (See Comments)    REACTION: "Become mentally unstable"      Family History  Problem Relation Age of Onset   Colon cancer Brother        in his 31s   Heart failure Mother      Social History Michael [name] reports that he has been smoking cigarettes. He has a 14.50 pack-year smoking history. He has never used smokeless tobacco. Michael [name] reports previous alcohol use.   Review of Systems CONSTITUTIONAL: No weight loss, fever, chills, weakness or fatigue.  HEENT: Eyes: No visual loss, blurred vision, double vision or yellow sclerae.No hearing loss, sneezing, congestion, runny nose or sore throat.  SKIN: No rash or itching.  CARDIOVASCULAR:  RESPIRATORY: No shortness of breath, cough or sputum.  GASTROINTESTINAL: No  anorexia, nausea, vomiting or diarrhea. No abdominal pain or blood.  GENITOURINARY: No burning on urination, no polyuria NEUROLOGICAL: No headache, dizziness, syncope, paralysis, ataxia, numbness or tingling in the extremities. No change in bowel or bladder control.  MUSCULOSKELETAL: No muscle, back pain, joint pain or stiffness.  LYMPHATICS: No enlarged nodes. No history of splenectomy.  PSYCHIATRIC: No history of depression or anxiety.  ENDOCRINOLOGIC: No reports of sweating, cold or heat intolerance. No polyuria or polydipsia.  Marland Kitchen   Physical Examination There were no vitals filed for this visit. There were no vitals filed for this visit.  Gen: resting comfortably, no acute distress HEENT: no scleral icterus, pupils equal round and reactive, no palptable cervical adenopathy,  CV Resp: Clear to auscultation bilaterally GI: abdomen is soft, non-tender, non-distended, normal bowel sounds, no hepatosplenomegaly MSK: extremities are warm, no edema.  Skin:  warm, no rash Neuro:  no focal deficits Psych: appropriate affect   Diagnostic Studies 03/2019 cath  Dist LAD lesion is 100% stenosed.  1st Mrg lesion is 100% stenosed.  The left ventricular systolic function is normal.  The left ventricular ejection fraction is 50-55% by visual estimate.  Post intervention, there is a 0% residual stenosis.  Probable embolic occlusion of the distal OM Michael [name] of the circumflex as well as distal LAD.  Normal RCA with superior takeoff.  Low normal global LV function with EF estimated 50 to 55% and focal area of mid anterolateral and focal apical hypocontractility.  Low level PTCA of the distal circumflex with improvement of flow from TIMI 0 to TIMI I in a small caliber distal vessel.  Spontaneous reperfusion of the LAD which ultimately wraps around the LV apex.  RECOMMENDATION: Aggrastat was started due to high suspect for embolic etiology rather than atherosclerotic plaque; continue  for 18 hours post procedure. DAPT with aspirin/Brilinta. Smoking cessation is imperative. We will plan for echo Doppler study in a.m.   03/2019 echo IMPRESSIONS   1. Left ventricular ejection fraction, by visual estimation, is 60 to 65%. The left ventricle has normal function. There is mildly increased left ventricular hypertrophy. 2. The left ventricle has no regional wall motion abnormalities. 3. Global right ventricle has normal systolic function.The right ventricular size is normal. No increase in right ventricular wall thickness. 4. Left atrial size was normal. 5. Right atrial size was normal. 6. The mitral valve is normal in structure. No evidence of mitral valve regurgitation. 7. The tricuspid valve is normal in structure. Tricuspid valve regurgitation is not demonstrated. 8. The aortic valve is grossly normal. Aortic valve regurgitation is not visualized. No evidence of aortic valve sclerosis or stenosis. 9. The pulmonic valve was normal in structure. Pulmonic valve regurgitation is not visualized. 10. Technically difficult echo with poor image quality. 11. The atrial septum is grossly normal.   05/2019 cath  There is moderate to severe left ventricular systolic dysfunction with regional and global hypokinesis. Ejection Fraction (EF) appears to be 25-35% by visual estimate.  LV end diastolic pressure is mildly elevated.  The previously occluded 1st Mrg treated with PTCA is widely patent.  Previously occluded Dist LAD lesionis now widely patent  Ost RCA lesion is 20% stenosed.  SUMMARY  Previously occluded distal circumflex is widely patent as is the LAD. Mild ostial RCA but otherwise normal, tortuous vessel.  Dilated Left Ventricle with global hypokinesis worse in the inferior and anterior apex. EF estimated 30 of 35%.  Suspect NONISCHEMIC CARDIOMYOPATHY   RECOMMENDATIONS  Recommend checking 2D echocardiogram for better assessment of EF and  wall motion.  Evaluate for causes of Nonischemic Cardiomyopathy with elevated troponin -> consider myocarditis.  Further plans per primary service -> would potentially consider cardiac MRI with concern for myocarditis.    Assessment and Plan  1. History of STEMI - appeared to be a cardioembolic MI, did not require stenting - unclear source of thromboembolism. Benign recent 30 day monitor - repeat cath with patent vessels -continue medical therapy  2. Dizziness - unclear isolated episode of dizziness, lightheadedness. Said he dropped his head but does not believe he loss consciousness. No recurrent episodes. Orthostatics negative. Recent 30 day monitor with short runs of NSVT, nothing sustained - he will discuss episode with his upcoming neuro appt, not a clear cardiac etiology at this time - if recurrent episodes without clear neuro etiology by evaluation and more suggestive of cardiac  etiology by history, given his prior NSVT would consider loop recorder placement      Arnoldo Lenis, M.D., F.A.C.C.

## 2019-09-23 DIAGNOSIS — M5416 Radiculopathy, lumbar region: Secondary | ICD-10-CM | POA: Diagnosis not present

## 2019-09-23 DIAGNOSIS — I219 Acute myocardial infarction, unspecified: Secondary | ICD-10-CM | POA: Diagnosis not present

## 2019-09-23 DIAGNOSIS — M25511 Pain in right shoulder: Secondary | ICD-10-CM | POA: Diagnosis not present

## 2019-09-23 DIAGNOSIS — Z79891 Long term (current) use of opiate analgesic: Secondary | ICD-10-CM | POA: Diagnosis not present

## 2019-09-23 DIAGNOSIS — M545 Low back pain: Secondary | ICD-10-CM | POA: Diagnosis not present

## 2019-09-26 DIAGNOSIS — S0292XD Unspecified fracture of facial bones, subsequent encounter for fracture with routine healing: Secondary | ICD-10-CM | POA: Diagnosis not present

## 2019-09-29 DIAGNOSIS — D869 Sarcoidosis, unspecified: Secondary | ICD-10-CM | POA: Diagnosis not present

## 2019-09-29 DIAGNOSIS — I2510 Atherosclerotic heart disease of native coronary artery without angina pectoris: Secondary | ICD-10-CM | POA: Diagnosis not present

## 2019-09-29 DIAGNOSIS — K219 Gastro-esophageal reflux disease without esophagitis: Secondary | ICD-10-CM | POA: Diagnosis not present

## 2019-09-29 DIAGNOSIS — M79602 Pain in left arm: Secondary | ICD-10-CM | POA: Diagnosis not present

## 2019-09-29 DIAGNOSIS — M79642 Pain in left hand: Secondary | ICD-10-CM | POA: Diagnosis not present

## 2019-09-29 DIAGNOSIS — M25512 Pain in left shoulder: Secondary | ICD-10-CM | POA: Diagnosis not present

## 2019-09-29 DIAGNOSIS — N182 Chronic kidney disease, stage 2 (mild): Secondary | ICD-10-CM | POA: Diagnosis not present

## 2019-09-29 DIAGNOSIS — Z96643 Presence of artificial hip joint, bilateral: Secondary | ICD-10-CM | POA: Diagnosis not present

## 2019-10-10 DIAGNOSIS — M12812 Other specific arthropathies, not elsewhere classified, left shoulder: Secondary | ICD-10-CM | POA: Diagnosis not present

## 2019-10-14 ENCOUNTER — Other Ambulatory Visit: Payer: Self-pay | Admitting: Cardiology

## 2019-10-16 DIAGNOSIS — S02401D Maxillary fracture, unspecified, subsequent encounter for fracture with routine healing: Secondary | ICD-10-CM | POA: Diagnosis not present

## 2019-10-21 ENCOUNTER — Telehealth: Payer: Self-pay | Admitting: Cardiology

## 2019-10-21 DIAGNOSIS — M5416 Radiculopathy, lumbar region: Secondary | ICD-10-CM | POA: Diagnosis not present

## 2019-10-21 DIAGNOSIS — M79609 Pain in unspecified limb: Secondary | ICD-10-CM | POA: Diagnosis not present

## 2019-10-21 DIAGNOSIS — I219 Acute myocardial infarction, unspecified: Secondary | ICD-10-CM | POA: Diagnosis not present

## 2019-10-21 DIAGNOSIS — M545 Low back pain: Secondary | ICD-10-CM | POA: Diagnosis not present

## 2019-10-21 DIAGNOSIS — M25511 Pain in right shoulder: Secondary | ICD-10-CM | POA: Diagnosis not present

## 2019-10-21 DIAGNOSIS — M79602 Pain in left arm: Secondary | ICD-10-CM | POA: Diagnosis not present

## 2019-10-21 DIAGNOSIS — Z79891 Long term (current) use of opiate analgesic: Secondary | ICD-10-CM | POA: Diagnosis not present

## 2019-10-21 NOTE — Telephone Encounter (Signed)
  Patient Consent for Virtual Visit         Michael [name] has provided verbal consent on 10/21/2019 for a virtual visit (video or telephone).   CONSENT FOR VIRTUAL VISIT FOR:  Michael [name]  By participating in this virtual visit I agree to the following:  I hereby voluntarily request, consent and authorize [facility] and its employed or contracted physicians, physician assistants, nurse practitioners or other licensed health care professionals (the Practitioner), to provide me with telemedicine health care services (the "Services") as deemed necessary by the treating Practitioner. I acknowledge and consent to receive the Services by the Practitioner via telemedicine. I understand that the telemedicine visit will involve communicating with the Practitioner through live audiovisual communication technology and the disclosure of certain medical information by electronic transmission. I acknowledge that I have been given the opportunity to request an in-person assessment or other available alternative prior to the telemedicine visit and am voluntarily participating in the telemedicine visit.  I understand that I have the right to withhold or withdraw my consent to the use of telemedicine in the course of my care at any time, without affecting my right to future care or treatment, and that the Practitioner or I may terminate the telemedicine visit at any time. I understand that I have the right to inspect all information obtained and/or recorded in the course of the telemedicine visit and may receive copies of available information for a reasonable fee.  I understand that some of the potential risks of receiving the Services via telemedicine include:  Marland Kitchen Delay or interruption in medical evaluation due to technological equipment failure or disruption; . Information transmitted may not be sufficient (e.g. poor resolution of images) to allow for appropriate medical decision making by the Practitioner;  and/or  . In rare instances, security protocols could fail, causing a breach of personal health information.  Furthermore, I acknowledge that it is my responsibility to provide information about my medical history, conditions and care that is complete and accurate to the best of my ability. I acknowledge that Practitioner's advice, recommendations, and/or decision may be based on factors not within their control, such as incomplete or inaccurate data provided by me or distortions of diagnostic images or specimens that may result from electronic transmissions. I understand that the practice of medicine is not an exact science and that Practitioner makes no warranties or guarantees regarding treatment outcomes. I acknowledge that a copy of this consent can be made available to me via my patient portal ([facility]), or I can request a printed copy by calling the office of [facility].    I understand that my insurance will be billed for this visit.   I have read or had this consent read to me. . I understand the contents of this consent, which adequately explains the benefits and risks of the Services being provided via telemedicine.  . I have been provided ample opportunity to ask questions regarding this consent and the Services and have had my questions answered to my satisfaction. . I give my informed consent for the services to be provided through the use of telemedicine in my medical care

## 2019-10-24 ENCOUNTER — Encounter: Payer: Self-pay | Admitting: Cardiology

## 2019-10-24 ENCOUNTER — Telehealth (INDEPENDENT_AMBULATORY_CARE_PROVIDER_SITE_OTHER): Payer: Medicare Other | Admitting: Cardiology

## 2019-10-24 VITALS — Ht 74.0 in | Wt 220.0 lb

## 2019-10-24 DIAGNOSIS — I252 Old myocardial infarction: Secondary | ICD-10-CM

## 2019-10-24 DIAGNOSIS — R42 Dizziness and giddiness: Secondary | ICD-10-CM

## 2019-10-24 MED ORDER — CLOPIDOGREL BISULFATE 75 MG PO TABS: ORAL_TABLET | ORAL | 3 refills | Status: DC
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
## 2019-10-24 NOTE — Addendum Note (Signed)
Addended by: Julian Hy T on: 10/24/2019 08:55 AM   Modules accepted: Orders

## 2019-10-24 NOTE — Patient Instructions (Signed)
Your physician recommends that you schedule a follow-up appointment in: [place] has recommended you make the following change in your medication:   STOP BRILINTA   START PLAVIX 75 MG - TAKE (4 TABLETS ON DAY 1) THEN TAKE 1 TABLET DAILY AFTER  Thank you for choosing [facility]!!

## 2019-11-13 ENCOUNTER — Other Ambulatory Visit: Payer: Self-pay | Admitting: Cardiology

## 2019-11-13 DIAGNOSIS — Z720 Tobacco use: Secondary | ICD-10-CM | POA: Diagnosis not present

## 2019-11-13 DIAGNOSIS — R21 Rash and other nonspecific skin eruption: Secondary | ICD-10-CM | POA: Diagnosis not present

## 2019-11-13 DIAGNOSIS — R110 Nausea: Secondary | ICD-10-CM | POA: Diagnosis not present

## 2019-11-13 DIAGNOSIS — D869 Sarcoidosis, unspecified: Secondary | ICD-10-CM | POA: Diagnosis not present

## 2019-11-13 DIAGNOSIS — M12812 Other specific arthropathies, not elsewhere classified, left shoulder: Secondary | ICD-10-CM | POA: Diagnosis not present

## 2019-11-17 ENCOUNTER — Encounter: Payer: Self-pay | Admitting: *Deleted

## 2019-11-17 MED ORDER — CLENPIQ 10-3.5-12 MG-GM -GM/160ML PO SOLN: 1.0000 | Freq: Once | ORAL | 0 refills | Status: AC

## 2019-11-17 NOTE — Addendum Note (Signed)
Addended by: Cheron Every on: 11/17/2019 11:26 AM   Modules accepted: Orders

## 2019-11-17 NOTE — Telephone Encounter (Addendum)
Called patient. He is scheduled for TCS with propofol with Dr. Gala Romney 9/13 at 10:00am. Patient is aware I will send his prep to his pharmacy [facility]. He asked I not send in the gallon. I told him to go ahead and pick this up to have on hand. He voiced understanding. I advised pt I mail instructions with pre-op/covid test. Confirmed mailing address. I advised pt if he does not get this to call us to let us know.

## 2019-11-18 ENCOUNTER — Telehealth: Payer: Self-pay | Admitting: *Deleted

## 2019-11-18 DIAGNOSIS — Z79891 Long term (current) use of opiate analgesic: Secondary | ICD-10-CM | POA: Diagnosis not present

## 2019-11-18 DIAGNOSIS — M79609 Pain in unspecified limb: Secondary | ICD-10-CM | POA: Diagnosis not present

## 2019-11-18 DIAGNOSIS — M25511 Pain in right shoulder: Secondary | ICD-10-CM | POA: Diagnosis not present

## 2019-11-18 DIAGNOSIS — M545 Low back pain: Secondary | ICD-10-CM | POA: Diagnosis not present

## 2019-11-18 DIAGNOSIS — M5412 Radiculopathy, cervical region: Secondary | ICD-10-CM | POA: Diagnosis not present

## 2019-11-18 DIAGNOSIS — M79602 Pain in left arm: Secondary | ICD-10-CM | POA: Diagnosis not present

## 2019-11-18 DIAGNOSIS — M5416 Radiculopathy, lumbar region: Secondary | ICD-10-CM | POA: Diagnosis not present

## 2019-11-18 NOTE — Telephone Encounter (Signed)
[facility] website for PA for TCS and received message:  Notification or Prior Authorization is not required for the requested services Decision ID #:[id number]

## 2019-11-20 DIAGNOSIS — I6521 Occlusion and stenosis of right carotid artery: Secondary | ICD-10-CM | POA: Diagnosis not present

## 2019-11-20 DIAGNOSIS — M5412 Radiculopathy, cervical region: Secondary | ICD-10-CM | POA: Diagnosis not present

## 2019-11-20 DIAGNOSIS — S199XXA Unspecified injury of neck, initial encounter: Secondary | ICD-10-CM | POA: Diagnosis not present

## 2019-11-20 DIAGNOSIS — M542 Cervicalgia: Secondary | ICD-10-CM | POA: Diagnosis not present

## 2019-11-25 DIAGNOSIS — I509 Heart failure, unspecified: Secondary | ICD-10-CM | POA: Diagnosis not present

## 2019-11-25 DIAGNOSIS — B070 Plantar wart: Secondary | ICD-10-CM | POA: Diagnosis not present

## 2019-11-25 DIAGNOSIS — Z720 Tobacco use: Secondary | ICD-10-CM | POA: Diagnosis not present

## 2019-11-25 DIAGNOSIS — D869 Sarcoidosis, unspecified: Secondary | ICD-10-CM | POA: Diagnosis not present

## 2019-11-25 DIAGNOSIS — N1832 Chronic kidney disease, stage 3b: Secondary | ICD-10-CM | POA: Diagnosis not present

## 2019-12-17 ENCOUNTER — Other Ambulatory Visit: Payer: Self-pay

## 2019-12-17 ENCOUNTER — Ambulatory Visit (INDEPENDENT_AMBULATORY_CARE_PROVIDER_SITE_OTHER): Payer: Medicare Other | Admitting: Cardiology

## 2019-12-17 ENCOUNTER — Encounter: Payer: Self-pay | Admitting: Cardiology

## 2019-12-17 VITALS — BP 114/72 | HR 65 | Ht 74.0 in | Wt 212.8 lb

## 2019-12-17 DIAGNOSIS — R0602 Shortness of breath: Secondary | ICD-10-CM

## 2019-12-17 DIAGNOSIS — I252 Old myocardial infarction: Secondary | ICD-10-CM

## 2019-12-17 NOTE — Patient Instructions (Signed)
Michael [name]  12/17/2019     @PREFPERIOPPHARMACY @   Your procedure is scheduled on  12/22/2019   Report to [facility] at  [email or web address].M.  Call this number if you have problems the morning of surgery:  [phone]   Remember:  Follow the diet and prep instructions given to you by the office.                        Take these medicines the morning of surgery with A SIP OF WATER  Metoprolol,lyrica. Use your nebulizer before you come.    Do not wear jewelry, make-up or nail polish.  Do not wear lotions, powders, or perfumes. Please wear deodorant and brush your teeth.  Do not shave 48 hours prior to surgery.  Men may shave face and neck.  Do not bring valuables to the hospital.  [facility] is not responsible for any belongings or valuables.  Contacts, dentures or bridgework may not be worn into surgery.  Leave your suitcase in the car.  After surgery it may be brought to your room.  For patients admitted to the hospital, discharge time will be determined by your treatment team.  Patients discharged the day of surgery will not be allowed to drive home.   Name and phone number of your driver:   family Special instructions:   DO NOT smoke the morning of your procedure.  Please read over the following fact sheets that you were given. Anesthesia Post-op Instructions and Care and Recovery After Surgery       Colonoscopy, Adult, Care After This sheet gives you information about how to care for yourself after your procedure. Your health care provider may also give you more specific instructions. If you have problems or questions, contact your health care provider. What can I expect after the procedure? After the procedure, it is common to have:  A small amount of blood in your stool for 24 hours after the procedure.  Some gas.  Mild cramping or bloating of your abdomen. Follow these instructions at home: Eating and drinking   Drink enough fluid to keep your urine pale  yellow.  Follow instructions from your health care provider about eating or drinking restrictions.  Resume your normal diet as instructed by your health care provider. Avoid heavy or fried foods that are hard to digest. Activity  Rest as told by your health care provider.  Avoid sitting for a long time without moving. Get up to take short walks every 1-2 hours. This is important to improve blood flow and breathing. Ask for help if you feel weak or unsteady.  Return to your normal activities as told by your health care provider. Ask your health care provider what activities are safe for you. Managing cramping and bloating   Try walking around when you have cramps or feel bloated.  Apply heat to your abdomen as told by your health care provider. Use the heat source that your health care provider recommends, such as a moist heat pack or a heating pad. ? Place a towel between your skin and the heat source. ? Leave the heat on for 20-30 minutes. ? Remove the heat if your skin turns bright red. This is especially important if you are unable to feel pain, heat, or cold. You may have a greater risk of getting burned. General instructions  For the first 24 hours after the procedure: ? Do not  drive or use machinery. ? Do not sign important documents. ? Do not drink alcohol. ? Do your regular daily activities at a slower pace than normal. ? Eat soft foods that are easy to digest.  Take over-the-counter and prescription medicines only as told by your health care provider.  Keep all follow-up visits as told by your health care provider. This is important. Contact a health care provider if:  You have blood in your stool 2-3 days after the procedure. Get help right away if you have:  More than a small spotting of blood in your stool.  Large blood clots in your stool.  Swelling of your abdomen.  Nausea or vomiting.  A fever.  Increasing pain in your abdomen that is not relieved with  medicine. Summary  After the procedure, it is common to have a small amount of blood in your stool. You may also have mild cramping and bloating of your abdomen.  For the first 24 hours after the procedure, do not drive or use machinery, sign important documents, or drink alcohol.  Get help right away if you have a lot of blood in your stool, nausea or vomiting, a fever, or increased pain in your abdomen. This information is not intended to replace advice given to you by your health care provider. Make sure you discuss any questions you have with your health care provider. Document Revised: 10/21/2018 Document Reviewed: 10/21/2018 Elsevier Patient Education  [place] These instructions provide you with information about caring for yourself after your procedure. Your health care provider may also give you more specific instructions. Your treatment has been planned according to current medical practices, but problems sometimes occur. Call your health care provider if you have any problems or questions after your procedure. What can I expect after the procedure? After your procedure, you may:  Feel sleepy for several hours.  Feel clumsy and have poor balance for several hours.  Feel forgetful about what happened after the procedure.  Have poor judgment for several hours.  Feel nauseous or vomit.  Have a sore throat if you had a breathing tube during the procedure. Follow these instructions at home: For at least 24 hours after the procedure:      Have a responsible adult stay with you. It is important to have someone help care for you until you are awake and alert.  Rest as needed.  Do not: ? Participate in activities in which you could fall or become injured. ? Drive. ? Use heavy machinery. ? Drink alcohol. ? Take sleeping pills or medicines that cause drowsiness. ? Make important decisions or sign legal documents. ? Take care  of children on your own. Eating and drinking  Follow the diet that is recommended by your health care provider.  If you vomit, drink water, juice, or soup when you can drink without vomiting.  Make sure you have little or no nausea before eating solid foods. General instructions  Take over-the-counter and prescription medicines only as told by your health care provider.  If you have sleep apnea, surgery and certain medicines can increase your risk for breathing problems. Follow instructions from your health care provider about wearing your sleep device: ? Anytime you are sleeping, including during daytime naps. ? While taking prescription pain medicines, sleeping medicines, or medicines that make you drowsy.  If you smoke, do not smoke without supervision.  Keep all follow-up visits as told by your health care provider. This  is important. Contact a health care provider if:  You keep feeling nauseous or you keep vomiting.  You feel light-headed.  You develop a rash.  You have a fever. Get help right away if:  You have trouble breathing. Summary  For several hours after your procedure, you may feel sleepy and have poor judgment.  Have a responsible adult stay with you for at least 24 hours or until you are awake and alert. This information is not intended to replace advice given to you by your health care provider. Make sure you discuss any questions you have with your health care provider. Document Revised: 06/25/2017 Document Reviewed: 07/18/2015 Elsevier Patient Education  [place].
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
## 2019-12-17 NOTE — Patient Instructions (Signed)
Your physician wants you to follow-up in: [place]  Your physician recommends that you continue on your current medications as directed. Please refer to the Current Medication list given to you today.  Your physician has recommended that you have a pulmonary function test. Pulmonary Function Tests are a group of tests that measure how well air moves in and out of your lungs.  Thank you for choosing [facility]!!

## 2019-12-18 DIAGNOSIS — M545 Low back pain: Secondary | ICD-10-CM | POA: Diagnosis not present

## 2019-12-18 DIAGNOSIS — M79602 Pain in left arm: Secondary | ICD-10-CM | POA: Diagnosis not present

## 2019-12-18 DIAGNOSIS — M5412 Radiculopathy, cervical region: Secondary | ICD-10-CM | POA: Diagnosis not present

## 2019-12-18 DIAGNOSIS — Z79891 Long term (current) use of opiate analgesic: Secondary | ICD-10-CM | POA: Diagnosis not present

## 2019-12-18 DIAGNOSIS — M25511 Pain in right shoulder: Secondary | ICD-10-CM | POA: Diagnosis not present

## 2019-12-19 ENCOUNTER — Other Ambulatory Visit (HOSPITAL_COMMUNITY): Payer: Medicare Other | Attending: Internal Medicine

## 2019-12-19 ENCOUNTER — Inpatient Hospital Stay (HOSPITAL_COMMUNITY)
Admission: RE | Admit: 2019-12-19 | Discharge: 2019-12-19 | Disposition: A | Discharge status: Home / Self Care | Payer: Medicare Other | Source: Ambulatory Visit

## 2019-12-19 ENCOUNTER — Encounter (HOSPITAL_COMMUNITY): Payer: Medicare Other

## 2019-12-19 ENCOUNTER — Encounter (HOSPITAL_COMMUNITY): Payer: Self-pay

## 2019-12-19 ENCOUNTER — Telehealth: Payer: Self-pay | Admitting: *Deleted

## 2019-12-19 NOTE — Telephone Encounter (Signed)
-----   Message from Encarnacion Chu, RN sent at 12/19/2019 10:50 AM EDT ----- Regarding: no show HAPPY Friday!!!!...:):).... Michael [name] did not show for his COVID or his PAT today. He is for Monday morning of course!

## 2019-12-19 NOTE — Pre-Procedure Instructions (Signed)
Interoffice message sent to [facility] @ [facility] because patient was a no show for his PAT and COIVD today.

## 2019-12-19 NOTE — Telephone Encounter (Signed)
Called pt. He states "My stomach don't feel good, I'm not going today". I advised will call endo and made aware procedure to be cancelled. Procedure has been cancelled multiple times since 2019. Last OV with Neil Crouch 08/08/2019. Please advise Dr. Gala Romney on how to proceed? Thanks

## 2019-12-20 NOTE — Telephone Encounter (Signed)
Needs OV.  

## 2019-12-22 ENCOUNTER — Encounter: Payer: Self-pay | Admitting: Internal Medicine

## 2019-12-22 ENCOUNTER — Ambulatory Visit (HOSPITAL_COMMUNITY): Admission: RE | Admit: 2019-12-22 | Payer: Medicare Other | Source: Home / Self Care | Admitting: Internal Medicine

## 2019-12-22 ENCOUNTER — Encounter (HOSPITAL_COMMUNITY): Admission: RE | Payer: Self-pay | Source: Home / Self Care

## 2019-12-22 SURGERY — COLONOSCOPY WITH PROPOFOL
Anesthesia: Monitor Anesthesia Care

## 2019-12-22 NOTE — Telephone Encounter (Signed)
Michael [name] please schedule and mail letter. thanks

## 2019-12-26 DIAGNOSIS — R112 Nausea with vomiting, unspecified: Secondary | ICD-10-CM | POA: Diagnosis not present

## 2020-01-13 DIAGNOSIS — G4700 Insomnia, unspecified: Secondary | ICD-10-CM | POA: Diagnosis not present

## 2020-01-29 ENCOUNTER — Telehealth: Payer: Self-pay | Admitting: Cardiology

## 2020-01-29 NOTE — Telephone Encounter (Signed)
Spoke with Cathey at [facility] and made aware that Kary Kos was d/c'd in July and that pt should be on plavix 75 mg daily - brilinta was still on pt profile but has not been filled but wanted to clarify before she removed it

## 2020-01-29 NOTE — Telephone Encounter (Signed)
Pharmacist states patient requested refill on his Brilinta.  It looks like he is taking something in the place of Brilinta but wanted to make sure.  Please advise on which medication he should be on.

## 2020-02-09 ENCOUNTER — Ambulatory Visit: Payer: Medicare Other | Admitting: Gastroenterology

## 2020-02-09 ENCOUNTER — Encounter: Payer: Self-pay | Admitting: Internal Medicine

## 2020-02-12 DIAGNOSIS — M25511 Pain in right shoulder: Secondary | ICD-10-CM | POA: Diagnosis not present

## 2020-02-12 DIAGNOSIS — Z79891 Long term (current) use of opiate analgesic: Secondary | ICD-10-CM | POA: Diagnosis not present

## 2020-02-12 DIAGNOSIS — M79602 Pain in left arm: Secondary | ICD-10-CM | POA: Diagnosis not present

## 2020-02-12 DIAGNOSIS — M5412 Radiculopathy, cervical region: Secondary | ICD-10-CM | POA: Diagnosis not present

## 2020-02-12 DIAGNOSIS — M545 Low back pain, unspecified: Secondary | ICD-10-CM | POA: Diagnosis not present

## 2020-02-19 ENCOUNTER — Encounter (HOSPITAL_COMMUNITY): Payer: Medicare Other

## 2020-02-26 DIAGNOSIS — G8929 Other chronic pain: Secondary | ICD-10-CM | POA: Diagnosis not present

## 2020-02-26 DIAGNOSIS — N189 Chronic kidney disease, unspecified: Secondary | ICD-10-CM | POA: Diagnosis not present

## 2020-02-26 DIAGNOSIS — Z720 Tobacco use: Secondary | ICD-10-CM | POA: Diagnosis not present

## 2020-02-26 DIAGNOSIS — I509 Heart failure, unspecified: Secondary | ICD-10-CM | POA: Diagnosis not present

## 2020-03-25 DIAGNOSIS — Z79891 Long term (current) use of opiate analgesic: Secondary | ICD-10-CM | POA: Diagnosis not present

## 2020-03-25 DIAGNOSIS — M5412 Radiculopathy, cervical region: Secondary | ICD-10-CM | POA: Diagnosis not present

## 2020-03-25 DIAGNOSIS — G4700 Insomnia, unspecified: Secondary | ICD-10-CM | POA: Diagnosis not present

## 2020-03-25 DIAGNOSIS — M545 Low back pain, unspecified: Secondary | ICD-10-CM | POA: Diagnosis not present

## 2020-03-25 DIAGNOSIS — M25511 Pain in right shoulder: Secondary | ICD-10-CM | POA: Diagnosis not present

## 2020-04-15 ENCOUNTER — Encounter: Payer: Self-pay | Admitting: Internal Medicine

## 2020-04-20 DIAGNOSIS — Z23 Encounter for immunization: Secondary | ICD-10-CM | POA: Diagnosis not present

## 2020-05-04 DIAGNOSIS — I7300 Raynaud's syndrome without gangrene: Secondary | ICD-10-CM | POA: Diagnosis not present

## 2020-05-04 DIAGNOSIS — Z7901 Long term (current) use of anticoagulants: Secondary | ICD-10-CM | POA: Diagnosis not present

## 2020-05-04 DIAGNOSIS — Z79891 Long term (current) use of opiate analgesic: Secondary | ICD-10-CM | POA: Diagnosis not present

## 2020-05-04 DIAGNOSIS — I129 Hypertensive chronic kidney disease with stage 1 through stage 4 chronic kidney disease, or unspecified chronic kidney disease: Secondary | ICD-10-CM | POA: Diagnosis not present

## 2020-05-04 DIAGNOSIS — N189 Chronic kidney disease, unspecified: Secondary | ICD-10-CM | POA: Diagnosis not present

## 2020-05-04 DIAGNOSIS — K219 Gastro-esophageal reflux disease without esophagitis: Secondary | ICD-10-CM | POA: Diagnosis not present

## 2020-05-04 DIAGNOSIS — Z9114 Patient's other noncompliance with medication regimen: Secondary | ICD-10-CM | POA: Diagnosis not present

## 2020-05-04 DIAGNOSIS — Z9089 Acquired absence of other organs: Secondary | ICD-10-CM | POA: Diagnosis not present

## 2020-05-04 DIAGNOSIS — I255 Ischemic cardiomyopathy: Secondary | ICD-10-CM | POA: Diagnosis not present

## 2020-05-04 DIAGNOSIS — I252 Old myocardial infarction: Secondary | ICD-10-CM | POA: Diagnosis not present

## 2020-05-04 DIAGNOSIS — D869 Sarcoidosis, unspecified: Secondary | ICD-10-CM | POA: Diagnosis not present

## 2020-05-04 DIAGNOSIS — I2510 Atherosclerotic heart disease of native coronary artery without angina pectoris: Secondary | ICD-10-CM | POA: Diagnosis not present

## 2020-05-04 DIAGNOSIS — R109 Unspecified abdominal pain: Secondary | ICD-10-CM | POA: Diagnosis not present

## 2020-05-04 DIAGNOSIS — R1031 Right lower quadrant pain: Secondary | ICD-10-CM | POA: Diagnosis not present

## 2020-05-04 DIAGNOSIS — R944 Abnormal results of kidney function studies: Secondary | ICD-10-CM | POA: Diagnosis not present

## 2020-05-04 DIAGNOSIS — Z720 Tobacco use: Secondary | ICD-10-CM | POA: Diagnosis not present

## 2020-05-04 DIAGNOSIS — I509 Heart failure, unspecified: Secondary | ICD-10-CM | POA: Diagnosis not present

## 2020-05-04 DIAGNOSIS — Z20822 Contact with and (suspected) exposure to covid-19: Secondary | ICD-10-CM | POA: Diagnosis not present

## 2020-05-04 DIAGNOSIS — K3580 Unspecified acute appendicitis: Secondary | ICD-10-CM | POA: Diagnosis not present

## 2020-05-04 DIAGNOSIS — F17210 Nicotine dependence, cigarettes, uncomplicated: Secondary | ICD-10-CM | POA: Diagnosis not present

## 2020-05-04 DIAGNOSIS — N1832 Chronic kidney disease, stage 3b: Secondary | ICD-10-CM | POA: Diagnosis not present

## 2020-05-04 DIAGNOSIS — G629 Polyneuropathy, unspecified: Secondary | ICD-10-CM | POA: Diagnosis not present

## 2020-05-04 DIAGNOSIS — Z9119 Patient's noncompliance with other medical treatment and regimen: Secondary | ICD-10-CM | POA: Diagnosis not present

## 2020-05-04 DIAGNOSIS — N182 Chronic kidney disease, stage 2 (mild): Secondary | ICD-10-CM | POA: Diagnosis not present

## 2020-05-04 DIAGNOSIS — E785 Hyperlipidemia, unspecified: Secondary | ICD-10-CM | POA: Diagnosis not present

## 2020-05-04 DIAGNOSIS — Z8679 Personal history of other diseases of the circulatory system: Secondary | ICD-10-CM | POA: Diagnosis not present

## 2020-05-04 DIAGNOSIS — F17200 Nicotine dependence, unspecified, uncomplicated: Secondary | ICD-10-CM | POA: Diagnosis not present

## 2020-05-04 DIAGNOSIS — K5900 Constipation, unspecified: Secondary | ICD-10-CM | POA: Diagnosis not present

## 2020-05-11 ENCOUNTER — Encounter: Payer: Self-pay | Admitting: Internal Medicine

## 2020-05-11 ENCOUNTER — Other Ambulatory Visit: Payer: Self-pay

## 2020-05-11 ENCOUNTER — Ambulatory Visit (INDEPENDENT_AMBULATORY_CARE_PROVIDER_SITE_OTHER): Payer: Medicare Other | Admitting: Internal Medicine

## 2020-05-11 VITALS — BP 133/85 | HR 83 | Temp 97.3°F | Ht 74.0 in | Wt 221.8 lb

## 2020-05-11 DIAGNOSIS — Z1211 Encounter for screening for malignant neoplasm of colon: Secondary | ICD-10-CM

## 2020-05-11 DIAGNOSIS — Z800 Family history of malignant neoplasm of digestive organs: Secondary | ICD-10-CM

## 2020-05-11 DIAGNOSIS — Z79899 Other long term (current) drug therapy: Secondary | ICD-10-CM

## 2020-05-11 DIAGNOSIS — R159 Full incontinence of feces: Secondary | ICD-10-CM

## 2020-05-11 NOTE — Progress Notes (Signed)
Primary Care Physician:  Leonie Douglas, MD Primary Gastroenterologist:  Dr. Gala Romney  Pre-Procedure History & Physical: HPI:  Michael [name] is a 60 y.o. male here to set up a colonoscopy.  Patient has at least a 10-year history of intermittant fecal soiling and incontinence.  Chronically constipated.  States he is aware when stool comes out;  comes out on its own from time to time;  he has not had any bleeding.  Last complete colonoscopy occurred in  2005 -  negative.  Attempted colonoscopy in 2012 but he was not prepped.  He was seen here in 2019 for similar symptoms.  Because he was having upper abdominal pain at that time, we recommended both an EGD and colonoscopy.  These procedures were set up on multiple occasions and the patient canceled out on multiple occasions. Just last week, he developed acute appendicitis and underwent appendectomy at [facility].  He gives a sketchy  history of a liver lesion being found and possibly biopsied.  I do not have records for review at this time. History of systemic sarcoidosis.  History of elevated LFTs. Chronic medications include Abilify and oxycodone.  History of CAD.  Takes Plavix daily. Past Medical History:  Diagnosis Date  . Allergic rhinitis   . Arthritis   . Chronic low back pain    follwed in Pain Clinic(Dr.Kirchmayer)  . CKD (chronic kidney disease), stage II   . Depression   . Depression with anxiety   . GERD (gastroesophageal reflux disease)   . History of kidney stones    passed  . HLD (hyperlipidemia) 03/23/2019  . Insomnia   . Migraines   . NSTEMI (non-ST elevated myocardial infarction) ([place])    x2  . Sarcoidosis    Multisystem,pulmonary and hepatic (Dr.Mckinlee Dunk)Liver bx in 06 mildly postive AMA but on re-check normal  . Sleep apnea    "tested, but said it wasnt bad enough for a machine".    Past Surgical History:  Procedure Laterality Date  . BACK SURGERY  2012   2nd back surgery 3 monthes ago and in 2007  .  CARDIAC CATHETERIZATION  05/20/2019  . COLONOSCOPY  11/05/2003   ALP:[id number] internal hemorrhoids, otherwise normal rectum and colon  . CORONARY/GRAFT ACUTE MI REVASCULARIZATION N/A 03/20/2019   Procedure: Coronary/Graft Acute MI Revascularization;  Surgeon: Troy Sine, MD;  Location: [facility];  Service: Cardiovascular;  Laterality: N/A;  . ESOPHAGOGASTRODUODENOSCOPY  03/22/2004   XBD:ZHGDJM esophagogastroduodenoscopy. Small hiatal hernia otherwise normal stomach  . HARVEST BONE GRAFT Left    thigh  . HIP ARTHROPLASTY Bilateral    left 2017, right 2016  . HIP SURGERY Left 2017   bone graft  . LAMINECTOMY  2009   Balateral L4-L5  . LEFT HEART CATH AND CORONARY ANGIOGRAPHY N/A 03/20/2019   Procedure: LEFT HEART CATH AND CORONARY ANGIOGRAPHY;  Surgeon: Troy Sine, MD;  Location: [facility];  Service: Cardiovascular;  Laterality: N/A;  . LEFT HEART CATH AND CORONARY ANGIOGRAPHY N/A 05/20/2019   Procedure: LEFT HEART CATH AND CORONARY ANGIOGRAPHY;  Surgeon: Leonie Man, MD;  Location: [facility];  Service: Cardiovascular;  Laterality: N/A;  . RESECTION DISTAL CLAVICAL Right 01/23/2019   Procedure: OPEN DISTAL CLAVICLE EXCISION;  Surgeon: Carole Civil, MD;  Location: [facility];  Service: Orthopedics;  Laterality: Right;  . TOOTH EXTRACTION Bilateral 12/28/2017   Procedure: DENTAL RESTORATION/EXTRACTIONS;  Surgeon: Diona Browner, DDS;  Location: [facility];  Service: Oral Surgery;  Laterality: Bilateral;    Prior to Admission medications   Medication Sig Start Date End Date Taking? Authorizing Provider  acetaminophen (TYLENOL) 325 MG tablet Take 2 tablets (650 mg total) by mouth every 4 (four) hours as needed for headache or mild pain. 03/23/19  Yes Isaiah Serge, NP  albuterol (PROVENTIL) (2.5 MG/3ML) 0.083% nebulizer solution Take 2.5 mg by nebulization every 6 (six) hours as needed for wheezing or shortness of breath.   Yes [provider]   ARIPiprazole (ABILIFY) 10 MG tablet Take 10 mg by mouth daily.   Yes [provider]  atorvastatin (LIPITOR) 80 MG tablet Take 1 tablet (80 mg total) by mouth daily. 11/13/19  Yes Arnoldo Lenis, MD  clopidogrel (PLAVIX) 75 MG tablet TAKE 4 (300 MG) TABLETS DAY 1 THEN TAKE 1 TABLET DAILY AFTER 10/24/19  Yes Branch, Alphonse Guild, MD  metoprolol succinate (TOPROL-XL) 25 MG 24 hr tablet TAKE 1/2 TABLET BY MOUTH DAILY. 10/14/19  Yes Isaiah Serge, NP  nitroGLYCERIN (NITROSTAT) 0.4 MG SL tablet DISSOLVE ONE TABLET UNDER TONGUE EVERY 5 MINUTES UP TO 3 DOSES AS NEEDED FOR CHEST PAIN. 08/06/19  Yes Isaiah Serge, NP  ondansetron (ZOFRAN) 4 MG tablet Take 4 mg by mouth every 8 (eight) hours as needed for nausea or vomiting.   Yes [provider]  oxyCODONE (OXY IR/ROXICODONE) 5 MG immediate release tablet Take 5 mg by mouth as needed for severe pain.   Yes [provider]  pramipexole (MIRAPEX) 0.5 MG tablet Take 0.5 mg by mouth at bedtime.   Yes [provider]  pregabalin (LYRICA) 225 MG capsule Take 225 mg by mouth daily.   Yes [provider]  zolpidem (AMBIEN) 5 MG tablet Take 10 mg by mouth at bedtime as needed. 05/15/19  Yes [provider]    Allergies as of 05/11/2020 - Review Complete 05/11/2020  Allergen Reaction Noted  . Methadone Other (See Comments)     Family History  Problem Relation Age of Onset  . Colon cancer Brother        in his 66s  . Heart failure Mother     Social History   Socioeconomic History  . Marital status: Legally Separated    Spouse name: Not on file  . Number of children: Not on file  . Years of education: Not on file  . Highest education level: Not on file  Occupational History  . Not on file  Tobacco Use  . Smoking status: Current Every Day Smoker    Packs/day: 0.50    Years: 29.00    Pack years: 14.50    Types: Cigarettes  . Smokeless tobacco: Never Used  Vaping Use  . Vaping Use: Never used   Substance and Sexual Activity  . Alcohol use: Not Currently  . Drug use: No  . Sexual activity: Yes  Other Topics Concern  . Not on file  Social History Narrative  . Not on file   Social Determinants of Health   Financial Resource Strain: Not on file  Food Insecurity: Not on file  Transportation Needs: Not on file  Physical Activity: Not on file  Stress: Not on file  Social Connections: Not on file  Intimate Partner Violence: Not on file    Review of Systems: See HPI, otherwise negative ROS  Physical Exam: BP 133/85   Pulse 83   Temp (!) 97.3 F (36.3 C)   Ht 6\' 2"  (1.88 m)   Wt 221 lb 12.8  oz (100.6 kg)   BMI 28.48 kg/m  General:   Alert, , pleasant and cooperative in NAD Skin:  Intact without significant lesions or rashes. Heart:  Regular rate and rhythm; no murmurs, clicks, rubs,  or gallops.  1+ gynecomastia Abdomen: Nondistended.  Recent laparoscopy port sites healing well.  Abdomen is soft and nontender. Pulses:  Normal pulses noted. Extremities:  Without clubbing or edema. Rectal: Declined by patient-will be done at the time of colonoscopy  Impression/Plan: 60 year old gentleman presents with bowel complaints of intermittent fecal smearing, incontinence in the setting of polypharmacy including chronic opioid use.  I suspect he has an element of opioid-induced constipation along with episodes of overflow incontinence.  Positive family history of colon cancer in a first-degree relative at a young age. It is notable he has had the symptoms for nearly a decade.  Attempted colonoscopy thwarted in 2012 because of lack of adequate preparation. Moreover, we saw the patient in 2019 and we have scheduled him for colonoscopy on multiple occasions.  On multiple occasions he has canceled the procedure. Noncompliance has been a real issue in his management.  I discussed this issue with him at length today. Appendectomy for acute appendicitis just last week.  Records are not  currently available for review. History of hepatic sarcoidosis with no recent follow-up as far as I can tell.  Recommendations:  I have offered the patient a diagnostic colonoscopy in the near future.  We will utilize propofol.  ASA 3.  The risk benefits limitations alternatives have been reviewed.  I emphasized the importance of an adequate prep.  This means compliance with prep instructions. I recommend extra half a day of clear liquids and Linzess 290 daily for 5 days prior to the procedure to facilitate colonic purge.  I see no need for an EGD at this time.  We will update labs -  hepatic function profile and CBC today  We will also records pertaining to his recent appendectomy up at [facility].  Further recommendations to follow.      Notice: This dictation was prepared with Dragon dictation along with smaller phrase technology. Any transcriptional errors that result from this process are unintentional and may not be corrected upon review.

## 2020-05-11 NOTE — Patient Instructions (Addendum)
You need to have a colonoscopy because of your bowel symptoms and the fact your brother had colon cancer at age 58  We will schedule a diagnostic colonoscopy in about 12 weeks (delay to allow for your appendix surgery to heal)  Utilize propofol ASA 3 for anesthesia.  You will need Linzess 290  -  1 capsule daily for 5 days prior to the procedure to help clean your colon out  Hepatic profile and CBC within the next week.  It is important to follow through with instructions and keep appointment to finally get a colonoscopy completed.

## 2020-05-31 DIAGNOSIS — Z720 Tobacco use: Secondary | ICD-10-CM | POA: Diagnosis not present

## 2020-05-31 DIAGNOSIS — N1832 Chronic kidney disease, stage 3b: Secondary | ICD-10-CM | POA: Diagnosis not present

## 2020-05-31 DIAGNOSIS — I10 Essential (primary) hypertension: Secondary | ICD-10-CM | POA: Diagnosis not present

## 2020-05-31 DIAGNOSIS — Z79899 Other long term (current) drug therapy: Secondary | ICD-10-CM | POA: Diagnosis not present

## 2020-05-31 DIAGNOSIS — I509 Heart failure, unspecified: Secondary | ICD-10-CM | POA: Diagnosis not present

## 2020-05-31 DIAGNOSIS — J449 Chronic obstructive pulmonary disease, unspecified: Secondary | ICD-10-CM | POA: Diagnosis not present

## 2020-06-01 DIAGNOSIS — J449 Chronic obstructive pulmonary disease, unspecified: Secondary | ICD-10-CM | POA: Diagnosis not present

## 2020-06-17 DIAGNOSIS — Z79891 Long term (current) use of opiate analgesic: Secondary | ICD-10-CM | POA: Diagnosis not present

## 2020-06-17 DIAGNOSIS — M79602 Pain in left arm: Secondary | ICD-10-CM | POA: Diagnosis not present

## 2020-06-17 DIAGNOSIS — M25511 Pain in right shoulder: Secondary | ICD-10-CM | POA: Diagnosis not present

## 2020-06-17 DIAGNOSIS — I219 Acute myocardial infarction, unspecified: Secondary | ICD-10-CM | POA: Diagnosis not present

## 2020-06-17 DIAGNOSIS — M5416 Radiculopathy, lumbar region: Secondary | ICD-10-CM | POA: Diagnosis not present

## 2020-06-17 DIAGNOSIS — M545 Low back pain, unspecified: Secondary | ICD-10-CM | POA: Diagnosis not present

## 2020-06-17 DIAGNOSIS — M5412 Radiculopathy, cervical region: Secondary | ICD-10-CM | POA: Diagnosis not present

## 2020-06-17 DIAGNOSIS — G4700 Insomnia, unspecified: Secondary | ICD-10-CM | POA: Diagnosis not present

## 2020-06-17 DIAGNOSIS — M79609 Pain in unspecified limb: Secondary | ICD-10-CM | POA: Diagnosis not present

## 2020-06-29 DIAGNOSIS — J449 Chronic obstructive pulmonary disease, unspecified: Secondary | ICD-10-CM | POA: Diagnosis not present

## 2020-07-01 ENCOUNTER — Encounter: Payer: Self-pay | Admitting: *Deleted

## 2020-07-02 ENCOUNTER — Encounter: Payer: Self-pay | Admitting: Cardiology

## 2020-07-02 ENCOUNTER — Other Ambulatory Visit: Payer: Self-pay

## 2020-07-02 ENCOUNTER — Ambulatory Visit (INDEPENDENT_AMBULATORY_CARE_PROVIDER_SITE_OTHER): Payer: Medicare Other | Admitting: Cardiology

## 2020-07-02 VITALS — BP 116/76 | HR 78 | Ht 74.0 in | Wt 222.4 lb

## 2020-07-02 DIAGNOSIS — R0602 Shortness of breath: Secondary | ICD-10-CM

## 2020-07-02 DIAGNOSIS — I252 Old myocardial infarction: Secondary | ICD-10-CM | POA: Diagnosis not present

## 2020-07-02 MED ORDER — ALBUTEROL SULFATE HFA 108 (90 BASE) MCG/ACT IN AERS
1.0000 | INHALATION_SPRAY | Freq: Four times a day (QID) | RESPIRATORY_TRACT | 2 refills | Status: DC | PRN
Start: 2020-07-02 — End: 2021-09-23

## 2020-07-02 NOTE — Patient Instructions (Signed)
Your physician recommends that you schedule a follow-up appointment in: [place] [facility] has recommended you make the following change in your medication:   STOP ASPIRIN   USE ALBUTEROL INHALER 1 PUFF EVERY 6 HOURS AS AS NEEDED  Your physician has recommended that you have a pulmonary function test. Pulmonary Function Tests are a group of tests that measure how well air moves in and out of your lungs.  Thank you for choosing [facility]!!

## 2020-07-02 NOTE — Progress Notes (Signed)
Clinical Summary Mr. Livermore is a 60 y.o.male seen today for follow up of the following medical problems.    1. History of embolic STEMI - admitted 03/2019 with STEMI - cathshowed occluded distal LAD and OM1, probable emoblic occlusion. Spontaneous reprfusion of LAD,low level PTCA of distal LCX - 03/2019 echo LVEF 60-65%, normal RV - unclear source of emboli. No significaint finding by echo. No afibnoted during admission   05/2019 admitted again with chest pain. Trop up to 5300 - cath 05/2019 showed no significant disease. LV gram 30-35% - 05/2019 cardiac MRI: LVEF 56%, inferolateral hypokinesis with delayed enhancement consistent with scar.    - sharp pain left sided. Comes on when feeling stressed. Worst with position - compliant with meds   2. History of sarcoid - 05/2019 cardiac MRI without signs of cardiac involvement  3. Chronic pain - followed in pain clinic    4. Tobacco history - 25+ years history - has not had PFTs per his report  - PFTs ordered las visit but appears not completed.  - ongoing wheezing, SOB at times.   7. CKD - followed by pcp   Past Medical History:  Diagnosis Date  . Allergic rhinitis   . Arthritis   . Chronic low back pain    follwed in Pain Clinic(Dr.Kirchmayer)  . CKD (chronic kidney disease), stage II   . Depression   . Depression with anxiety   . GERD (gastroesophageal reflux disease)   . History of kidney stones    passed  . HLD (hyperlipidemia) 03/23/2019  . Insomnia   . Migraines   . NSTEMI (non-ST elevated myocardial infarction) ([place])    x2  . Sarcoidosis    Multisystem,pulmonary and hepatic (Dr.Rourk)Liver bx in 06 mildly postive AMA but on re-check normal  . Sleep apnea    "tested, but said it wasnt bad enough for a machine".     Allergies  Allergen Reactions  . Methadone Other (See Comments)    REACTION: "Become mentally unstable"     Current Outpatient Medications  Medication Sig  Dispense Refill  . acetaminophen (TYLENOL) 325 MG tablet Take 2 tablets (650 mg total) by mouth every 4 (four) hours as needed for headache or mild pain.    Marland Kitchen albuterol (PROVENTIL) (2.5 MG/3ML) 0.083% nebulizer solution Take 2.5 mg by nebulization every 6 (six) hours as needed for wheezing or shortness of breath.    . ARIPiprazole (ABILIFY) 10 MG tablet Take 10 mg by mouth daily.    Marland Kitchen atorvastatin (LIPITOR) 80 MG tablet Take 1 tablet (80 mg total) by mouth daily. 30 tablet 6  . clopidogrel (PLAVIX) 75 MG tablet TAKE 4 (300 MG) TABLETS DAY 1 THEN TAKE 1 TABLET DAILY AFTER 90 tablet 3  . metoprolol succinate (TOPROL-XL) 25 MG 24 hr tablet TAKE 1/2 TABLET BY MOUTH DAILY. 15 tablet 6  . nitroGLYCERIN (NITROSTAT) 0.4 MG SL tablet DISSOLVE ONE TABLET UNDER TONGUE EVERY 5 MINUTES UP TO 3 DOSES AS NEEDED FOR CHEST PAIN. 25 tablet 4  . ondansetron (ZOFRAN) 4 MG tablet Take 4 mg by mouth every 8 (eight) hours as needed for nausea or vomiting.    Marland Kitchen oxyCODONE (OXY IR/ROXICODONE) 5 MG immediate release tablet Take 5 mg by mouth as needed for severe pain.    . pramipexole (MIRAPEX) 0.5 MG tablet Take 0.5 mg by mouth at bedtime.    . pregabalin (LYRICA) 225 MG capsule Take 225 mg by mouth daily.    Marland Kitchen zolpidem (AMBIEN)  5 MG tablet Take 10 mg by mouth at bedtime as needed.     No current facility-administered medications for this visit.     Past Surgical History:  Procedure Laterality Date  . BACK SURGERY  2012   2nd back surgery 3 monthes ago and in 2007  . CARDIAC CATHETERIZATION  05/20/2019  . COLONOSCOPY  11/05/2003   [id number] internal hemorrhoids, otherwise normal rectum and colon  . CORONARY/GRAFT ACUTE MI REVASCULARIZATION N/A 03/20/2019   Procedure: Coronary/Graft Acute MI Revascularization;  Surgeon: Troy Sine, MD;  Location: [facility];  Service: Cardiovascular;  Laterality: N/A;  . ESOPHAGOGASTRODUODENOSCOPY  03/22/2004   HQP:[id number] esophagogastroduodenoscopy. Small hiatal  hernia otherwise normal stomach  . HARVEST BONE GRAFT Left    thigh  . HIP ARTHROPLASTY Bilateral    left 2017, right 2016  . HIP SURGERY Left 2017   bone graft  . LAMINECTOMY  2009   Balateral L4-L5  . LEFT HEART CATH AND CORONARY ANGIOGRAPHY N/A 03/20/2019   Procedure: LEFT HEART CATH AND CORONARY ANGIOGRAPHY;  Surgeon: Troy Sine, MD;  Location: [facility];  Service: Cardiovascular;  Laterality: N/A;  . LEFT HEART CATH AND CORONARY ANGIOGRAPHY N/A 05/20/2019   Procedure: LEFT HEART CATH AND CORONARY ANGIOGRAPHY;  Surgeon: Leonie Man, MD;  Location: [facility];  Service: Cardiovascular;  Laterality: N/A;  . RESECTION DISTAL CLAVICAL Right 01/23/2019   Procedure: OPEN DISTAL CLAVICLE EXCISION;  Surgeon: Carole Civil, MD;  Location: [facility];  Service: Orthopedics;  Laterality: Right;  . TOOTH EXTRACTION Bilateral 12/28/2017   Procedure: DENTAL RESTORATION/EXTRACTIONS;  Surgeon: Diona Browner, DDS;  Location: [facility];  Service: Oral Surgery;  Laterality: Bilateral;     Allergies  Allergen Reactions  . Methadone Other (See Comments)    REACTION: "Become mentally unstable"      Family History  Problem Relation Age of Onset  . Colon cancer Brother        in his 28s  . Heart failure Mother      Social History Mr. Zingale reports that he has been smoking cigarettes. He has a 14.50 pack-year smoking history. He has never used smokeless tobacco. Mr. Gartrell reports previous alcohol use.   Review of Systems CONSTITUTIONAL: No weight loss, fever, chills, weakness or fatigue.  HEENT: Eyes: No visual loss, blurred vision, double vision or yellow sclerae.No hearing loss, sneezing, congestion, runny nose or sore throat.  SKIN: No rash or itching.  CARDIOVASCULAR: per hpi RESPIRATORY: No shortness of breath, cough or sputum.  GASTROINTESTINAL: No anorexia, nausea, vomiting or diarrhea. No abdominal pain or blood.  GENITOURINARY: No burning on urination, no  polyuria NEUROLOGICAL: No headache, dizziness, syncope, paralysis, ataxia, numbness or tingling in the extremities. No change in bowel or bladder control.  MUSCULOSKELETAL: No muscle, back pain, joint pain or stiffness.  LYMPHATICS: No enlarged nodes. No history of splenectomy.  PSYCHIATRIC: No history of depression or anxiety.  ENDOCRINOLOGIC: No reports of sweating, cold or heat intolerance. No polyuria or polydipsia.  Marland Kitchen   Physical Examination Vitals:   07/02/20 0950  BP: 116/76  Pulse: 78  SpO2: 97%   Filed Weights   07/02/20 0950  Weight: 222 lb 6.4 oz (100.9 kg)    Gen: resting comfortably, no acute distress HEENT: no scleral icterus, pupils equal round and reactive, no palptable cervical adenopathy,  CV: RRR, no m/r/g, no jvd Resp: Clear to auscultation bilaterally GI: abdomen is soft, non-tender, non-distended, normal bowel sounds, no hepatosplenomegaly  MSK: extremities are warm, no edema.  Skin: warm, no rash Neuro:  no focal deficits Psych: appropriate affect   Diagnostic Studies 03/2019 cath  Dist LAD lesion is 100% stenosed.  1st Mrg lesion is 100% stenosed.  The left ventricular systolic function is normal.  The left ventricular ejection fraction is 50-55% by visual estimate.  Post intervention, there is a 0% residual stenosis.  Probable embolic occlusion of the distal OM branch of the circumflex as well as distal LAD.  Normal RCA with superior takeoff.  Low normal global LV function with EF estimated 50 to 55% and focal area of mid anterolateral and focal apical hypocontractility.  Low level PTCA of the distal circumflex with improvement of flow from TIMI 0 to TIMI I in a small caliber distal vessel.  Spontaneous reperfusion of the LAD which ultimately wraps around the LV apex.  RECOMMENDATION: Aggrastat was started due to high suspect for embolic etiology rather than atherosclerotic plaque; continue for 18 hours post procedure. DAPT with  aspirin/Brilinta. Smoking cessation is imperative. We will plan for echo Doppler study in a.m.   03/2019 echo IMPRESSIONS   1. Left ventricular ejection fraction, by visual estimation, is 60 to 65%. The left ventricle has normal function. There is mildly increased left ventricular hypertrophy. 2. The left ventricle has no regional wall motion abnormalities. 3. Global right ventricle has normal systolic function.The right ventricular size is normal. No increase in right ventricular wall thickness. 4. Left atrial size was normal. 5. Right atrial size was normal. 6. The mitral valve is normal in structure. No evidence of mitral valve regurgitation. 7. The tricuspid valve is normal in structure. Tricuspid valve regurgitation is not demonstrated. 8. The aortic valve is grossly normal. Aortic valve regurgitation is not visualized. No evidence of aortic valve sclerosis or stenosis. 9. The pulmonic valve was normal in structure. Pulmonic valve regurgitation is not visualized. 10. Technically difficult echo with poor image quality. 11. The atrial septum is grossly normal.   05/2019 cath  There is moderate to severe left ventricular systolic dysfunction with regional and global hypokinesis. Ejection Fraction (EF) appears to be 25-35% by visual estimate.  LV end diastolic pressure is mildly elevated.  The previously occluded 1st Mrg treated with PTCA is widely patent.  Previously occluded Dist LAD lesionis now widely patent  Ost RCA lesion is 20% stenosed.  SUMMARY  Previously occluded distal circumflex is widely patent as is the LAD. Mild ostial RCA but otherwise normal, tortuous vessel.  Dilated Left Ventricle with global hypokinesis worse in the inferior and anterior apex. EF estimated 30 of 35%.  Suspect NONISCHEMIC CARDIOMYOPATHY   RECOMMENDATIONS  Recommend checking 2D echocardiogram for better assessment of EF and wall motion.  Evaluate for causes of  Nonischemic Cardiomyopathy with elevated troponin -> consider myocarditis.  Further plans per primary service -> would potentially consider cardiac MRI with concern for myocarditis.   08/2019 ABIs Summary:  Right: Resting right ankle-brachial index is within normal range. No  evidence of significant right lower extremity arterial disease. The right  toe-brachial index is normal.   Left: Resting left ankle-brachial index is within normal range. No  evidence of significant left lower extremity arterial disease. The left  toe-brachial index is normal.      Assessment and Plan  1. History of STEMI - appeared to be a cardioembolic MI, did not require stenting - unclear source of thromboembolism.Benign recent 30 day monitor - repeat cath with patent vessels -no recent cardiac pains -  we have elected to continue plavix, can stop ASA   2. SOB -will reorder PFTs - have given prn albuterol inhaler     Arnoldo Lenis, M.D.

## 2020-07-07 ENCOUNTER — Encounter: Payer: Self-pay | Admitting: *Deleted

## 2020-07-08 DIAGNOSIS — G5602 Carpal tunnel syndrome, left upper limb: Secondary | ICD-10-CM | POA: Diagnosis not present

## 2020-07-09 ENCOUNTER — Telehealth: Payer: Self-pay | Admitting: Cardiology

## 2020-07-09 NOTE — Telephone Encounter (Signed)
Pre-cert Verification for the following procedure    PFT'S  DATE:    08/10/2020  LOCATION:  [facility]

## 2020-07-16 DIAGNOSIS — G5602 Carpal tunnel syndrome, left upper limb: Secondary | ICD-10-CM | POA: Diagnosis not present

## 2020-07-30 DIAGNOSIS — J449 Chronic obstructive pulmonary disease, unspecified: Secondary | ICD-10-CM | POA: Diagnosis not present

## 2020-08-02 ENCOUNTER — Other Ambulatory Visit: Payer: Self-pay | Admitting: Cardiology

## 2020-08-06 ENCOUNTER — Other Ambulatory Visit (HOSPITAL_COMMUNITY)
Admission: RE | Admit: 2020-08-06 | Discharge: 2020-08-06 | Disposition: A | Discharge status: Home / Self Care | Payer: Medicare Other | Source: Ambulatory Visit | Attending: Cardiology | Admitting: Cardiology

## 2020-08-06 DIAGNOSIS — Z01812 Encounter for preprocedural laboratory examination: Secondary | ICD-10-CM | POA: Diagnosis not present

## 2020-08-06 DIAGNOSIS — Z20822 Contact with and (suspected) exposure to covid-19: Secondary | ICD-10-CM | POA: Diagnosis not present

## 2020-08-06 NOTE — Progress Notes (Signed)
Couldn't reach patient. Left message with voicemail. I explained to him if he could come this afternoon to call me. If not I asked him to please call Hoyle Sauer and ask her to reschedule his appointment for Monday. His PFT is Tuesday at 11:00.

## 2020-08-09 ENCOUNTER — Other Ambulatory Visit (HOSPITAL_COMMUNITY)
Admission: RE | Admit: 2020-08-09 | Discharge: 2020-08-09 | Disposition: A | Discharge status: Home / Self Care | Payer: Medicare Other | Source: Ambulatory Visit | Attending: Cardiology | Admitting: Cardiology

## 2020-08-09 ENCOUNTER — Other Ambulatory Visit: Payer: Self-pay

## 2020-08-09 DIAGNOSIS — Z20822 Contact with and (suspected) exposure to covid-19: Secondary | ICD-10-CM | POA: Insufficient documentation

## 2020-08-09 DIAGNOSIS — Z01812 Encounter for preprocedural laboratory examination: Secondary | ICD-10-CM | POA: Insufficient documentation

## 2020-08-09 LAB — SARS CORONAVIRUS 2 (TAT 6-24 HRS): SARS Coronavirus 2: NEGATIVE

## 2020-08-10 ENCOUNTER — Ambulatory Visit (HOSPITAL_COMMUNITY): Payer: Medicare Other

## 2020-08-11 ENCOUNTER — Ambulatory Visit (HOSPITAL_COMMUNITY)
Admission: RE | Admit: 2020-08-11 | Discharge: 2020-08-11 | Disposition: A | Discharge status: Home / Self Care | Payer: Medicare Other | Source: Ambulatory Visit | Attending: Cardiology | Admitting: Cardiology

## 2020-08-11 ENCOUNTER — Other Ambulatory Visit: Payer: Self-pay

## 2020-08-11 DIAGNOSIS — R0602 Shortness of breath: Secondary | ICD-10-CM | POA: Insufficient documentation

## 2020-08-11 LAB — PULMONARY FUNCTION TEST
DL/VA % pred: 62 %
DL/VA: 2.65 ml/min/mmHg/L
DLCO unc % pred: 44 %
DLCO unc: 13.37 ml/min/mmHg
FEF 25-75 Post: 2.61 L/sec
FEF 25-75 Pre: 2.97 L/sec
FEF2575-%Change-Post: -12 %
FEF2575-%Pred-Post: 80 %
FEF2575-%Pred-Pre: 91 %
FEV1-%Change-Post: -3 %
FEV1-%Pred-Post: 78 %
FEV1-%Pred-Pre: 80 %
FEV1-Post: 2.7 L
FEV1-Pre: 2.78 L
FEV1FVC-%Change-Post: 0 %
FEV1FVC-%Pred-Pre: 103 %
FEV6-%Change-Post: -3 %
FEV6-%Pred-Post: 77 %
FEV6-%Pred-Pre: 80 %
FEV6-Post: 3.29 L
FEV6-Pre: 3.41 L
FEV6FVC-%Change-Post: 0 %
FEV6FVC-%Pred-Post: 102 %
FEV6FVC-%Pred-Pre: 103 %
FVC-%Change-Post: -3 %
FVC-%Pred-Post: 75 %
FVC-%Pred-Pre: 78 %
FVC-Post: 3.31 L
FVC-Pre: 3.42 L
Post FEV1/FVC ratio: 82 %
Post FEV6/FVC ratio: 99 %
Pre FEV1/FVC ratio: 82 %
Pre FEV6/FVC Ratio: 100 %
RV % pred: 95 %
RV: 2.2 L
TLC % pred: 75 %
TLC: 5.58 L

## 2020-08-11 MED ORDER — ALBUTEROL SULFATE (2.5 MG/3ML) 0.083% IN NEBU
2.5000 mg | INHALATION_SOLUTION | Freq: Once | RESPIRATORY_TRACT | Status: AC
  Administered 2020-08-11: 2.5 mg via RESPIRATORY_TRACT

## 2020-08-13 ENCOUNTER — Telehealth: Payer: Self-pay | Admitting: *Deleted

## 2020-08-13 DIAGNOSIS — M25511 Pain in right shoulder: Secondary | ICD-10-CM | POA: Diagnosis not present

## 2020-08-13 DIAGNOSIS — M79609 Pain in unspecified limb: Secondary | ICD-10-CM | POA: Diagnosis not present

## 2020-08-13 DIAGNOSIS — M5412 Radiculopathy, cervical region: Secondary | ICD-10-CM | POA: Diagnosis not present

## 2020-08-13 DIAGNOSIS — Z79891 Long term (current) use of opiate analgesic: Secondary | ICD-10-CM | POA: Diagnosis not present

## 2020-08-13 DIAGNOSIS — M545 Low back pain, unspecified: Secondary | ICD-10-CM | POA: Diagnosis not present

## 2020-08-13 DIAGNOSIS — I219 Acute myocardial infarction, unspecified: Secondary | ICD-10-CM | POA: Diagnosis not present

## 2020-08-13 DIAGNOSIS — M5416 Radiculopathy, lumbar region: Secondary | ICD-10-CM | POA: Diagnosis not present

## 2020-08-13 DIAGNOSIS — R0602 Shortness of breath: Secondary | ICD-10-CM

## 2020-08-13 DIAGNOSIS — G4700 Insomnia, unspecified: Secondary | ICD-10-CM | POA: Diagnosis not present

## 2020-08-13 DIAGNOSIS — M79602 Pain in left arm: Secondary | ICD-10-CM | POA: Diagnosis not present

## 2020-08-13 NOTE — Telephone Encounter (Signed)
Pt notified and order placed 

## 2020-08-13 NOTE — Telephone Encounter (Signed)
-----   Message from Arnoldo Lenis, MD sent at 08/13/2020  4:13 PM EDT ----- Abnormal breathing tests, show lungs have decreased ability to absorb oxygen. Can we refer him to pulmonary for SOB   Zandra Abts MD

## 2020-08-19 DIAGNOSIS — N1832 Chronic kidney disease, stage 3b: Secondary | ICD-10-CM | POA: Diagnosis not present

## 2020-08-19 DIAGNOSIS — I252 Old myocardial infarction: Secondary | ICD-10-CM | POA: Diagnosis not present

## 2020-08-19 DIAGNOSIS — Z720 Tobacco use: Secondary | ICD-10-CM | POA: Diagnosis not present

## 2020-08-19 DIAGNOSIS — J449 Chronic obstructive pulmonary disease, unspecified: Secondary | ICD-10-CM | POA: Diagnosis not present

## 2020-08-19 DIAGNOSIS — M5410 Radiculopathy, site unspecified: Secondary | ICD-10-CM | POA: Diagnosis not present

## 2020-08-19 DIAGNOSIS — D869 Sarcoidosis, unspecified: Secondary | ICD-10-CM | POA: Diagnosis not present

## 2020-08-19 DIAGNOSIS — I208 Other forms of angina pectoris: Secondary | ICD-10-CM | POA: Diagnosis not present

## 2020-08-29 DIAGNOSIS — J449 Chronic obstructive pulmonary disease, unspecified: Secondary | ICD-10-CM | POA: Diagnosis not present

## 2020-09-08 DIAGNOSIS — M542 Cervicalgia: Secondary | ICD-10-CM | POA: Diagnosis not present

## 2020-09-08 DIAGNOSIS — F17210 Nicotine dependence, cigarettes, uncomplicated: Secondary | ICD-10-CM | POA: Diagnosis not present

## 2020-09-08 DIAGNOSIS — I2510 Atherosclerotic heart disease of native coronary artery without angina pectoris: Secondary | ICD-10-CM | POA: Diagnosis not present

## 2020-09-08 DIAGNOSIS — K219 Gastro-esophageal reflux disease without esophagitis: Secondary | ICD-10-CM | POA: Diagnosis not present

## 2020-09-08 DIAGNOSIS — Z8489 Family history of other specified conditions: Secondary | ICD-10-CM | POA: Diagnosis not present

## 2020-09-08 DIAGNOSIS — Z96643 Presence of artificial hip joint, bilateral: Secondary | ICD-10-CM | POA: Diagnosis not present

## 2020-09-08 DIAGNOSIS — D869 Sarcoidosis, unspecified: Secondary | ICD-10-CM | POA: Diagnosis not present

## 2020-09-08 DIAGNOSIS — I493 Ventricular premature depolarization: Secondary | ICD-10-CM | POA: Diagnosis not present

## 2020-09-08 DIAGNOSIS — Z801 Family history of malignant neoplasm of trachea, bronchus and lung: Secondary | ICD-10-CM | POA: Diagnosis not present

## 2020-09-08 DIAGNOSIS — N182 Chronic kidney disease, stage 2 (mild): Secondary | ICD-10-CM | POA: Diagnosis not present

## 2020-09-08 DIAGNOSIS — R9431 Abnormal electrocardiogram [ECG] [EKG]: Secondary | ICD-10-CM | POA: Diagnosis not present

## 2020-09-08 DIAGNOSIS — M25512 Pain in left shoulder: Secondary | ICD-10-CM | POA: Diagnosis not present

## 2020-09-15 DIAGNOSIS — R42 Dizziness and giddiness: Secondary | ICD-10-CM | POA: Diagnosis not present

## 2020-09-15 DIAGNOSIS — T679XXA Effect of heat and light, unspecified, initial encounter: Secondary | ICD-10-CM | POA: Diagnosis not present

## 2020-09-15 DIAGNOSIS — N182 Chronic kidney disease, stage 2 (mild): Secondary | ICD-10-CM | POA: Diagnosis not present

## 2020-09-15 DIAGNOSIS — F17210 Nicotine dependence, cigarettes, uncomplicated: Secondary | ICD-10-CM | POA: Diagnosis not present

## 2020-09-15 DIAGNOSIS — I2510 Atherosclerotic heart disease of native coronary artery without angina pectoris: Secondary | ICD-10-CM | POA: Diagnosis not present

## 2020-09-16 DIAGNOSIS — Z5189 Encounter for other specified aftercare: Secondary | ICD-10-CM | POA: Diagnosis not present

## 2020-09-20 ENCOUNTER — Other Ambulatory Visit: Payer: Self-pay

## 2020-09-20 ENCOUNTER — Encounter: Payer: Self-pay | Admitting: Pulmonary Disease

## 2020-09-20 ENCOUNTER — Ambulatory Visit (INDEPENDENT_AMBULATORY_CARE_PROVIDER_SITE_OTHER): Payer: Medicare Other | Admitting: Pulmonary Disease

## 2020-09-20 VITALS — BP 142/92 | HR 79 | Temp 96.9°F | Ht 74.0 in | Wt 235.0 lb

## 2020-09-20 DIAGNOSIS — R0602 Shortness of breath: Secondary | ICD-10-CM

## 2020-09-20 MED ORDER — BREO ELLIPTA 200-25 MCG/INH IN AEPB: 1.0000 | INHALATION_SPRAY | Freq: Every day | RESPIRATORY_TRACT | 0 refills | Status: DC

## 2020-09-20 NOTE — Progress Notes (Signed)
[facility]  Chief Complaint  Patient presents with   Consult    Shortness of breath    Constitutional:  BP (!) 142/92 (BP Location: Left Arm, Cuff Size: Normal)   Pulse 79   Temp (!) 96.9 F (36.1 C) (Temporal)   Ht 6\' 2"  (1.88 m)   Wt 235 lb (106.6 kg)   SpO2 98% Comment: Room air  BMI 30.17 kg/m   Past Medical History:  Allergies, A, Back pain, CKD, Depression, Anxiety, GERD, Nephrolithiasis, HLD, Migraine headache, Insomnia, Sarcoidosis with liver involvement, OSA, CAD  Past Surgical History:  He  has a past surgical history that includes Laminectomy (2009); Back surgery (2012); Esophagogastroduodenoscopy (03/22/2004); Colonoscopy (11/05/2003); Hip Arthroplasty (Bilateral); Harvest bone graft (Left); Hip surgery (Left, 2017); Tooth Extraction (Bilateral, 12/28/2017); Resection distal clavical (Right, 01/23/2019); Coronary/Graft Acute MI Revascularization (N/A, 03/20/2019); LEFT HEART CATH AND CORONARY ANGIOGRAPHY (N/A, 03/20/2019); LEFT HEART CATH AND CORONARY ANGIOGRAPHY (N/A, 05/20/2019); and Cardiac catheterization (05/20/2019).  Brief Summary:  Michael [name] is a 60 y.o. male smoker with dyspnea.      Subjective:   He was diagnosed with sarcoidosis in 1998.  He was told this involved his lung and liver.  He was told that there is nothing he could do and it would eventually "eat his body up".  He has been on disability since age 68.  He is from [place].  No animal/bird exposures.  No history of TB or pneumonia.  He was seen by cardiology recently and there was concern about his breathing.  He had PFT in May 2022 that showed mild restriction and moderate to severe diffusion defect.  He smokes about 1/2 pack per day.  He has intermittent cough and wheeze.  He sometimes gets winded if he does to much.  He smokes mostly because he gets bored and has stress from his family.  Chest xray from 05/04/20 normal.  Labs from 09/15/20: Na  142, K 4.5, CO2 26, Creatinine 1.87, Ca 8.7, Bili 0.2, ALP 109, AST 13, ALT 18, WBC 8.6, Hb 13.4, PLT 296.  Physical Exam:   Appearance - well kempt   ENMT - no sinus tenderness, no oral exudate, no LAN, Mallampati 3 airway, no stridor  Respiratory - equal breath sounds bilaterally, no wheezing or rales  CV - s1s2 regular rate and rhythm, no murmurs  Ext - no clubbing, no edema  Skin - no rashes  Psych - normal mood and affect   Pulmonary testing:  PFT 08/11/20 >> FEV1 2.78 (80%), FEV1% 82, TLC 5.58 (75%), DLCO 44%  Chest Imaging:    Sleep Tests:    Cardiac Tests:  Echo 03/21/19 >> EF 60 to 65%, mild LVH  Social History:  He  reports that he has been smoking cigarettes. He has a 14.50 pack-year smoking history. He has never used smokeless tobacco. He reports previous alcohol use. He reports that he does not use drugs.  Family History:  His family history includes Colon cancer in his brother; Heart failure in his mother.    Discussion:  He has history of smoking and sarcoidosis.  He has intermittent episodes of cough, wheeze, and dyspnea.  He has frequent albuterol use.  His recent PFT showed mild restriction and moderate to severe diffusion defect.  Explained that these findings could be related to history of sarcoidosis and/or emphysema.  He feels like he has undergone so many tests already and reluctant to undergo additional testing at this time.  He is agreeable to do short trial of maintenance inhaler therapy.  Assessment/Plan:   Dyspnea, cough, and wheezing. - given him sample of breo 200 one puff daily; he will call for refill if he feels this is helpful - prn albuterol - explained he would need high resolution CT chest to better assess cause of his diffusion defect seen on PFT; he opted to defer additional testing for now  Cardioembolic MI. - followed by Dr. Carlyle Dolly with [facility]  Time Spent Involved in Patient Care on Day of Examination:  34  minutes  Follow up:   Patient Instructions  Try sample of Breo 200 one puff daily, and rinse your mouth after each use; call once sample is almost done to let us know if you would like to send in a prescription for Breo.  Follow up in 3 months.  Medication List:   Allergies as of 09/20/2020       Reactions   Methadone Other (See Comments)   REACTION: "Become mentally unstable"        Medication List        Accurate as of September 20, 2020 11:36 AM. If you have any questions, ask your nurse or doctor.          acetaminophen 325 MG tablet Commonly known as: TYLENOL Take 2 tablets (650 mg total) by mouth every 4 (four) hours as needed for headache or mild pain.   albuterol (2.5 MG/3ML) 0.083% nebulizer solution Commonly known as: PROVENTIL Take 2.5 mg by nebulization every 6 (six) hours as needed for wheezing or shortness of breath.   albuterol 108 (90 Base) MCG/ACT inhaler Commonly known as: VENTOLIN HFA Inhale 1 puff into the lungs every 6 (six) hours as needed for wheezing or shortness of breath.   ARIPiprazole 10 MG tablet Commonly known as: ABILIFY Take 10 mg by mouth daily.   atorvastatin 20 MG tablet Commonly known as: LIPITOR Take 1 tablet by mouth daily. What changed: Another medication with the same name was removed. Continue taking this medication, and follow the directions you see here. Changed by: Chesley Mires, MD   Breo Ellipta 200-25 MCG/INH Aepb Generic drug: fluticasone furoate-vilanterol Inhale 1 puff into the lungs daily. Started by: Chesley Mires, MD   clopidogrel 75 MG tablet Commonly known as: PLAVIX TAKE 4 (300 MG) TABLETS DAY 1 THEN TAKE 1 TABLET DAILY AFTER   cyclobenzaprine 10 MG tablet Commonly known as: FLEXERIL Take 1 tablet by mouth at bedtime as needed.   metoprolol succinate 25 MG 24 hr tablet Commonly known as: TOPROL-XL TAKE 1/2 TABLET BY MOUTH DAILY.   nitroGLYCERIN 0.4 MG SL tablet Commonly known as: NITROSTAT DISSOLVE  ONE TABLET UNDER TONGUE EVERY 5 MINUTES UP TO 3 DOSES AS NEEDED FOR CHEST PAIN.   ondansetron 4 MG tablet Commonly known as: ZOFRAN Take 4 mg by mouth every 8 (eight) hours as needed for nausea or vomiting.   oxyCODONE 5 MG immediate release tablet Commonly known as: Oxy IR/ROXICODONE Take 5 mg by mouth as needed for severe pain. Takes 10mg    pramipexole 0.5 MG tablet Commonly known as: MIRAPEX Take 0.5 mg by mouth at bedtime.   pregabalin 225 MG capsule Commonly known as: LYRICA Take 225 mg by mouth daily.   zolpidem 5 MG tablet Commonly known as: AMBIEN Take 10 mg by mouth at bedtime as needed.        Signature:  Chesley Mires, MD [place] Pager - [phone] -  [phone] 09/20/2020, 11:36 AM

## 2020-09-20 NOTE — Patient Instructions (Signed)
Try sample of Breo 200 one puff daily, and rinse your mouth after each use; call once sample is almost done to let us know if you would like to send in a prescription for Breo.  Follow up in 3 months.

## 2020-09-27 DIAGNOSIS — I10 Essential (primary) hypertension: Secondary | ICD-10-CM | POA: Diagnosis not present

## 2020-09-27 DIAGNOSIS — Z720 Tobacco use: Secondary | ICD-10-CM | POA: Diagnosis not present

## 2020-09-29 ENCOUNTER — Telehealth: Payer: Self-pay | Admitting: Cardiology

## 2020-09-29 DIAGNOSIS — J449 Chronic obstructive pulmonary disease, unspecified: Secondary | ICD-10-CM | POA: Diagnosis not present

## 2020-09-29 NOTE — Telephone Encounter (Signed)
     Pt c/o medication issue:  1. Name of Medication: lisinopril 5 mg  2. How are you currently taking this medication (dosage and times per day)?   3. Are you having a reaction (difficulty breathing--STAT)?   4. What is your medication issue? Myra with [facility] calling, she said they been requesting refill for lisinopril 5mg  but it was not on pt's med list. She said it was Cecilie Kicks send prescription while pt was in the hospital. She wanted to know if pt needs to be on this med, if yes they need refill for it

## 2020-09-29 NOTE — Telephone Encounter (Signed)
Per Myra, lisinopril 5 mg last filled on 08/02/20 #30 at [facility]. Per Myra, patient is not compliant with medications and is in the process of getting medications bubble packed for more compliance. Lisinopril 5 mg was removed from med list in February 2022 by GI. Please advise if patient is to restart lisinopril.

## 2020-09-30 NOTE — Telephone Encounter (Signed)
Pharmacy called back asking for Alma Friendly to call at her convenience .

## 2020-10-01 NOTE — Telephone Encounter (Signed)
Not clear why lisinopril was stopped, was not our office. Not a medication he has to be on, im ok being off of it can reassess at f/u   J Talyn Eddie MD

## 2020-10-01 NOTE — Telephone Encounter (Signed)
Myra at [facility] informed and verbalized understanding of plan.

## 2020-10-06 DIAGNOSIS — Z79891 Long term (current) use of opiate analgesic: Secondary | ICD-10-CM | POA: Diagnosis not present

## 2020-10-06 DIAGNOSIS — M5416 Radiculopathy, lumbar region: Secondary | ICD-10-CM | POA: Diagnosis not present

## 2020-10-06 DIAGNOSIS — M79609 Pain in unspecified limb: Secondary | ICD-10-CM | POA: Diagnosis not present

## 2020-10-06 DIAGNOSIS — M79602 Pain in left arm: Secondary | ICD-10-CM | POA: Diagnosis not present

## 2020-10-06 DIAGNOSIS — M545 Low back pain, unspecified: Secondary | ICD-10-CM | POA: Diagnosis not present

## 2020-10-06 DIAGNOSIS — I219 Acute myocardial infarction, unspecified: Secondary | ICD-10-CM | POA: Diagnosis not present

## 2020-10-06 DIAGNOSIS — M25511 Pain in right shoulder: Secondary | ICD-10-CM | POA: Diagnosis not present

## 2020-10-06 DIAGNOSIS — G4700 Insomnia, unspecified: Secondary | ICD-10-CM | POA: Diagnosis not present

## 2020-10-06 DIAGNOSIS — M5412 Radiculopathy, cervical region: Secondary | ICD-10-CM | POA: Diagnosis not present

## 2020-10-25 ENCOUNTER — Other Ambulatory Visit: Payer: Self-pay | Admitting: Cardiology

## 2020-10-26 ENCOUNTER — Other Ambulatory Visit: Payer: Self-pay | Admitting: *Deleted

## 2020-10-26 MED ORDER — CLOPIDOGREL BISULFATE 75 MG PO TABS: 75.0000 mg | ORAL_TABLET | Freq: Every day | ORAL | 3 refills | Status: DC

## 2020-11-18 ENCOUNTER — Encounter: Payer: Self-pay | Admitting: Cardiology

## 2020-11-18 DIAGNOSIS — N1832 Chronic kidney disease, stage 3b: Secondary | ICD-10-CM | POA: Diagnosis not present

## 2020-11-18 DIAGNOSIS — M5137 Other intervertebral disc degeneration, lumbosacral region: Secondary | ICD-10-CM | POA: Diagnosis not present

## 2020-11-18 DIAGNOSIS — G44209 Tension-type headache, unspecified, not intractable: Secondary | ICD-10-CM | POA: Diagnosis not present

## 2020-11-18 DIAGNOSIS — N189 Chronic kidney disease, unspecified: Secondary | ICD-10-CM | POA: Diagnosis not present

## 2020-11-18 DIAGNOSIS — E782 Mixed hyperlipidemia: Secondary | ICD-10-CM | POA: Diagnosis not present

## 2020-11-18 DIAGNOSIS — Z79899 Other long term (current) drug therapy: Secondary | ICD-10-CM | POA: Diagnosis not present

## 2020-11-18 DIAGNOSIS — I10 Essential (primary) hypertension: Secondary | ICD-10-CM | POA: Diagnosis not present

## 2020-11-18 DIAGNOSIS — E559 Vitamin D deficiency, unspecified: Secondary | ICD-10-CM | POA: Diagnosis not present

## 2020-11-18 DIAGNOSIS — Z720 Tobacco use: Secondary | ICD-10-CM | POA: Diagnosis not present

## 2020-11-26 ENCOUNTER — Telehealth: Payer: Self-pay

## 2020-11-26 ENCOUNTER — Other Ambulatory Visit: Payer: Self-pay | Admitting: Cardiology

## 2020-11-26 MED ORDER — ATORVASTATIN CALCIUM 20 MG PO TABS: 20.0000 mg | ORAL_TABLET | Freq: Every day | ORAL | 3 refills | Status: DC

## 2020-11-26 NOTE — Telephone Encounter (Signed)
Medication refill request for Atorvastatin approved.

## 2020-11-29 DIAGNOSIS — J449 Chronic obstructive pulmonary disease, unspecified: Secondary | ICD-10-CM | POA: Diagnosis not present

## 2020-12-02 DIAGNOSIS — M5416 Radiculopathy, lumbar region: Secondary | ICD-10-CM | POA: Diagnosis not present

## 2020-12-02 DIAGNOSIS — M79602 Pain in left arm: Secondary | ICD-10-CM | POA: Diagnosis not present

## 2020-12-02 DIAGNOSIS — Z79891 Long term (current) use of opiate analgesic: Secondary | ICD-10-CM | POA: Diagnosis not present

## 2020-12-02 DIAGNOSIS — M25511 Pain in right shoulder: Secondary | ICD-10-CM | POA: Diagnosis not present

## 2020-12-02 DIAGNOSIS — M5412 Radiculopathy, cervical region: Secondary | ICD-10-CM | POA: Diagnosis not present

## 2020-12-02 DIAGNOSIS — I219 Acute myocardial infarction, unspecified: Secondary | ICD-10-CM | POA: Diagnosis not present

## 2020-12-02 DIAGNOSIS — G4700 Insomnia, unspecified: Secondary | ICD-10-CM | POA: Diagnosis not present

## 2020-12-02 DIAGNOSIS — M545 Low back pain, unspecified: Secondary | ICD-10-CM | POA: Diagnosis not present

## 2020-12-02 DIAGNOSIS — M79609 Pain in unspecified limb: Secondary | ICD-10-CM | POA: Diagnosis not present

## 2020-12-21 ENCOUNTER — Ambulatory Visit: Payer: Medicare Other | Admitting: Pulmonary Disease

## 2020-12-30 DIAGNOSIS — E559 Vitamin D deficiency, unspecified: Secondary | ICD-10-CM | POA: Diagnosis not present

## 2020-12-30 DIAGNOSIS — N189 Chronic kidney disease, unspecified: Secondary | ICD-10-CM | POA: Diagnosis not present

## 2020-12-30 DIAGNOSIS — N1832 Chronic kidney disease, stage 3b: Secondary | ICD-10-CM | POA: Diagnosis not present

## 2020-12-30 DIAGNOSIS — J449 Chronic obstructive pulmonary disease, unspecified: Secondary | ICD-10-CM | POA: Diagnosis not present

## 2020-12-30 DIAGNOSIS — G44209 Tension-type headache, unspecified, not intractable: Secondary | ICD-10-CM | POA: Diagnosis not present

## 2020-12-30 DIAGNOSIS — E782 Mixed hyperlipidemia: Secondary | ICD-10-CM | POA: Diagnosis not present

## 2020-12-30 DIAGNOSIS — M5137 Other intervertebral disc degeneration, lumbosacral region: Secondary | ICD-10-CM | POA: Diagnosis not present

## 2020-12-30 DIAGNOSIS — Z720 Tobacco use: Secondary | ICD-10-CM | POA: Diagnosis not present

## 2021-01-04 ENCOUNTER — Encounter: Payer: Self-pay | Admitting: *Deleted

## 2021-01-05 ENCOUNTER — Encounter: Payer: Self-pay | Admitting: *Deleted

## 2021-01-05 ENCOUNTER — Ambulatory Visit (INDEPENDENT_AMBULATORY_CARE_PROVIDER_SITE_OTHER): Payer: Medicare Other | Admitting: Cardiology

## 2021-01-05 ENCOUNTER — Encounter: Payer: Self-pay | Admitting: Cardiology

## 2021-01-05 VITALS — BP 156/90 | HR 72 | Ht 74.0 in | Wt 242.0 lb

## 2021-01-05 DIAGNOSIS — I252 Old myocardial infarction: Secondary | ICD-10-CM | POA: Diagnosis not present

## 2021-01-05 DIAGNOSIS — I10 Essential (primary) hypertension: Secondary | ICD-10-CM | POA: Diagnosis not present

## 2021-01-05 MED ORDER — METOPROLOL SUCCINATE ER 25 MG PO TB24: ORAL_TABLET | ORAL | 2 refills | Status: DC

## 2021-01-05 MED ORDER — AMLODIPINE BESYLATE 5 MG PO TABS: 5.0000 mg | ORAL_TABLET | Freq: Every day | ORAL | 6 refills | Status: DC

## 2021-01-05 NOTE — Patient Instructions (Addendum)
Medication Instructions:  Your physician has recommended you make the following change in your medication: Hold Metoprolol for 2 weeks.  Start Norvasc 5mg   daily Continue all other medications the same    Labwork: None  Testing/Procedures: none  Follow-Up: Your physician recommends that you schedule a follow-up appointment in: 6 months  Any Other Special Instructions Will Be Listed Below (If Applicable).  If you need a refill on your cardiac medications before your next appointment, please call your pharmacy.

## 2021-01-05 NOTE — Progress Notes (Signed)
Clinical Summary Michael [name] is a 60 y.o.male seen today for follow up of the following medical problems.        1. History of embolic STEMI - admitted 03/2019 with STEMI - cath showed occluded distal LAD and OM1, probable emoblic occlusion. Spontaneous reprfusion of LAD, low level PTCA of distal LCX - 03/2019 echo LVEF 60-65%, normal RV - unclear source of emboli. No significaint finding by echo. No afib noted during admission     05/2019 admitted again with chest pain. Trop up to 5300 -  cath 05/2019 showed no significant disease. LV gram 30-35% - 05/2019 cardiac MRI: LVEF 56%, inferolateral hypokinesis with delayed enhancement consistent with scar.        - compliant with meds. No recent symptoms    2. History of sarcoid - 05/2019 cardiac MRI without signs of cardiac involvement   3. Chronic pain - followed in pain clinic       4. Tobacco history - 25+ years history - has not had PFTs per his report   - PFTs ordered las visit but appears not completed.  - ongoing wheezing, SOB at times  - abnormal PFTs 08/2020 - . followed by Dr Halford Chessman, trial of breo    7. CKD - followed by pcp     Past Medical History:  Diagnosis Date   Allergic rhinitis    Arthritis    Chronic low back pain    follwed in Pain Clinic(Dr.Kirchmayer)   CKD (chronic kidney disease), stage II    Depression    Depression with anxiety    GERD (gastroesophageal reflux disease)    History of kidney stones    passed   HLD (hyperlipidemia) 03/23/2019   Insomnia    Migraines    NSTEMI (non-ST elevated myocardial infarction) ([place])    x2   Sarcoidosis    Multisystem,pulmonary and hepatic (Dr.Rourk)Liver bx in 06 mildly postive AMA but on re-check normal   Sleep apnea    "tested, but said it wasnt bad enough for a machine".     Allergies  Allergen Reactions   Methadone Other (See Comments)    REACTION: "Become mentally unstable"     Current Outpatient Medications  Medication Sig  Dispense Refill   acetaminophen (TYLENOL) 325 MG tablet Take 2 tablets (650 mg total) by mouth every 4 (four) hours as needed for headache or mild pain.     albuterol (PROVENTIL) (2.5 MG/3ML) 0.083% nebulizer solution Take 2.5 mg by nebulization every 6 (six) hours as needed for wheezing or shortness of breath.     albuterol (VENTOLIN HFA) 108 (90 Base) MCG/ACT inhaler Inhale 1 puff into the lungs every 6 (six) hours as needed for wheezing or shortness of breath. 8 g 2   ARIPiprazole (ABILIFY) 10 MG tablet Take 10 mg by mouth daily.     atorvastatin (LIPITOR) 20 MG tablet Take 1 tablet (20 mg total) by mouth daily. 30 tablet 3   clopidogrel (PLAVIX) 75 MG tablet Take 1 tablet (75 mg total) by mouth daily. 90 tablet 3   cyclobenzaprine (FLEXERIL) 10 MG tablet Take 1 tablet by mouth at bedtime as needed.     fluticasone furoate-vilanterol (BREO ELLIPTA) 200-25 MCG/INH AEPB Inhale 1 puff into the lungs daily. 60 each 0   metoprolol succinate (TOPROL-XL) 25 MG 24 hr tablet TAKE 1/2 TABLET BY MOUTH DAILY (MORNING) 45 tablet 2   nitroGLYCERIN (NITROSTAT) 0.4 MG SL tablet DISSOLVE ONE TABLET UNDER TONGUE EVERY 5 MINUTES UP  TO 3 DOSES AS NEEDED FOR CHEST PAIN. 25 tablet 4   ondansetron (ZOFRAN) 4 MG tablet Take 4 mg by mouth every 8 (eight) hours as needed for nausea or vomiting.     oxyCODONE (OXY IR/ROXICODONE) 5 MG immediate release tablet Take 5 mg by mouth as needed for severe pain. Takes 10mg      pramipexole (MIRAPEX) 0.5 MG tablet Take 0.5 mg by mouth at bedtime.     pregabalin (LYRICA) 225 MG capsule Take 225 mg by mouth daily.     zolpidem (AMBIEN) 5 MG tablet Take 10 mg by mouth at bedtime as needed.     No current facility-administered medications for this visit.     Past Surgical History:  Procedure Laterality Date   BACK SURGERY  2012   2nd back surgery 3 monthes ago and in 2007   [place]  05/20/2019   COLONOSCOPY  11/05/2003   [id number] internal hemorrhoids,  otherwise normal rectum and colon   CORONARY/GRAFT ACUTE MI REVASCULARIZATION N/A 03/20/2019   Procedure: Coronary/Graft Acute MI Revascularization;  Surgeon: Troy Sine, MD;  Location: [facility];  Service: Cardiovascular;  Laterality: N/A;   ESOPHAGOGASTRODUODENOSCOPY  03/22/2004   [id number] esophagogastroduodenoscopy. Small hiatal hernia otherwise normal stomach   HARVEST BONE GRAFT Left    thigh   HIP ARTHROPLASTY Bilateral    left 2017, right 2016   HIP SURGERY Left 2017   bone graft   LAMINECTOMY  2009   Balateral L4-L5   LEFT HEART CATH AND CORONARY ANGIOGRAPHY N/A 03/20/2019   Procedure: LEFT HEART CATH AND CORONARY ANGIOGRAPHY;  Surgeon: Troy Sine, MD;  Location: [facility];  Service: Cardiovascular;  Laterality: N/A;   LEFT HEART CATH AND CORONARY ANGIOGRAPHY N/A 05/20/2019   Procedure: LEFT HEART CATH AND CORONARY ANGIOGRAPHY;  Surgeon: Leonie Man, MD;  Location: [facility];  Service: Cardiovascular;  Laterality: N/A;   RESECTION DISTAL CLAVICAL Right 01/23/2019   Procedure: OPEN DISTAL CLAVICLE EXCISION;  Surgeon: Carole Civil, MD;  Location: [facility];  Service: Orthopedics;  Laterality: Right;   TOOTH EXTRACTION Bilateral 12/28/2017   Procedure: DENTAL RESTORATION/EXTRACTIONS;  Surgeon: Diona Browner, DDS;  Location: [facility];  Service: Oral Surgery;  Laterality: Bilateral;     Allergies  Allergen Reactions   Methadone Other (See Comments)    REACTION: "Become mentally unstable"      Family History  Problem Relation Age of Onset   Colon cancer Brother        in his 23s   Heart failure Mother      Social History Michael [name] reports that he has been smoking cigarettes. He has a 14.50 pack-year smoking history. He has never used smokeless tobacco. Michael [name] reports that he does not currently use alcohol.   Review of Systems CONSTITUTIONAL: No weight loss, fever, chills, weakness or fatigue.  HEENT: Eyes: No visual loss,  blurred vision, double vision or yellow sclerae.No hearing loss, sneezing, congestion, runny nose or sore throat.  SKIN: No rash or itching.  CARDIOVASCULAR: per hpi RESPIRATORY: No shortness of breath, cough or sputum.  GASTROINTESTINAL: No anorexia, nausea, vomiting or diarrhea. No abdominal pain or blood.  GENITOURINARY: No burning on urination, no polyuria NEUROLOGICAL: No headache, dizziness, syncope, paralysis, ataxia, numbness or tingling in the extremities. No change in bowel or bladder control.  MUSCULOSKELETAL: No muscle, back pain, joint pain or stiffness.  LYMPHATICS: No enlarged nodes. No history of splenectomy.  PSYCHIATRIC: No history of  depression or anxiety.  ENDOCRINOLOGIC: No reports of sweating, cold or heat intolerance. No polyuria or polydipsia.  Marland Kitchen   Physical Examination Today's Vitals   01/05/21 1139  BP: (!) 156/90  Pulse: 72  SpO2: 98%  Weight: 242 lb (109.8 kg)  Height: 6\' 2"  (1.88 m)   Body mass index is 31.07 kg/m.  Gen: resting comfortably, no acute distress HEENT: no scleral icterus, pupils equal round and reactive, no palptable cervical adenopathy,  CV: RRR, no m/rg, no jvd Resp: Clear to auscultation bilaterally GI: abdomen is soft, non-tender, non-distended, normal bowel sounds, no hepatosplenomegaly MSK: extremities are warm, no edema.  Skin: warm, no rash Neuro:  no focal deficits Psych: appropriate affect   Diagnostic Studies 03/2019 cath Dist LAD lesion is 100% stenosed. 1st Mrg lesion is 100% stenosed. The left ventricular systolic function is normal. The left ventricular ejection fraction is 50-55% by visual estimate. Post intervention, there is a 0% residual stenosis.   Probable embolic occlusion of the distal OM Tyjay Galindo of the circumflex as well as distal LAD.   Normal RCA with superior takeoff.   Low normal global LV function with EF estimated 50 to 55% and focal area of mid anterolateral and focal apical hypocontractility.    Low level PTCA of the distal circumflex with improvement of flow from TIMI 0 to TIMI I in a small caliber distal vessel.   Spontaneous reperfusion of the LAD which ultimately wraps around the LV apex.   RECOMMENDATION: Aggrastat was started due to high suspect for embolic etiology rather than atherosclerotic plaque; continue for 18 hours post procedure.  DAPT with aspirin/Brilinta.  Smoking cessation is imperative.  We will plan for echo Doppler study in a.m.     03/2019 echo IMPRESSIONS      1. Left ventricular ejection fraction, by visual estimation, is 60 to 65%. The left ventricle has normal function. There is mildly increased left ventricular hypertrophy.  2. The left ventricle has no regional wall motion abnormalities.  3. Global right ventricle has normal systolic function.The right ventricular size is normal. No increase in right ventricular wall thickness.  4. Left atrial size was normal.  5. Right atrial size was normal.  6. The mitral valve is normal in structure. No evidence of mitral valve regurgitation.  7. The tricuspid valve is normal in structure. Tricuspid valve regurgitation is not demonstrated.  8. The aortic valve is grossly normal. Aortic valve regurgitation is not visualized. No evidence of aortic valve sclerosis or stenosis.  9. The pulmonic valve was normal in structure. Pulmonic valve regurgitation is not visualized. 10. Technically difficult echo with poor image quality. 11. The atrial septum is grossly normal.     05/2019 cath There is moderate to severe left ventricular systolic dysfunction with regional and global hypokinesis. Ejection Fraction (EF) appears to be 25-35% by visual estimate. LV end diastolic pressure is mildly elevated. The previously occluded 1st Mrg treated with PTCA is widely patent. Previously occluded Dist LAD lesionis now widely patent Ost RCA lesion is 20% stenosed.   SUMMARY Previously occluded distal circumflex is widely patent  as is the LAD.  Mild ostial RCA but otherwise normal, tortuous vessel. Dilated Left Ventricle with global hypokinesis worse in the inferior and anterior apex.  EF estimated 30 of 35%. Suspect NONISCHEMIC CARDIOMYOPATHY     RECOMMENDATIONS Recommend checking 2D echocardiogram for better assessment of EF and wall motion. Evaluate for causes of Nonischemic Cardiomyopathy with elevated troponin -> consider myocarditis. Further plans per primary  service -> would potentially consider cardiac MRI with concern for myocarditis.     08/2019 ABIs Summary:  Right: Resting right ankle-brachial index is within normal range. No  evidence of significant right lower extremity arterial disease. The right  toe-brachial index is normal.   Left: Resting left ankle-brachial index is within normal range. No  evidence of significant left lower extremity arterial disease. The left  toe-brachial index is normal.     Assessment and Plan  1. History of STEMI - appeared to be a cardioembolic MI, did not require stenting - unclear source of thromboembolism. Benign recent 30 day monitor - repeat cath with patent vessels -no recent symptoms, we have continued plavix.      2. HTN - above goal, start norvasc 5mg  daily  3. Fatigue - nonspecific symptoms of fatigue and dizziness, unclear cause. Will try holding toprol x 2 weeks and update [place] on symptoms      Arnoldo Lenis, M.D.

## 2021-01-10 ENCOUNTER — Encounter: Payer: Self-pay | Admitting: Pulmonary Disease

## 2021-01-10 ENCOUNTER — Telehealth: Payer: Self-pay | Admitting: Pulmonary Disease

## 2021-01-10 ENCOUNTER — Ambulatory Visit (INDEPENDENT_AMBULATORY_CARE_PROVIDER_SITE_OTHER): Payer: Medicare Other | Admitting: Pulmonary Disease

## 2021-01-10 ENCOUNTER — Other Ambulatory Visit: Payer: Self-pay

## 2021-01-10 VITALS — BP 130/78 | HR 86 | Temp 98.2°F | Ht 73.0 in | Wt 248.0 lb

## 2021-01-10 DIAGNOSIS — Z720 Tobacco use: Secondary | ICD-10-CM

## 2021-01-10 DIAGNOSIS — R0609 Other forms of dyspnea: Secondary | ICD-10-CM | POA: Diagnosis not present

## 2021-01-10 DIAGNOSIS — M542 Cervicalgia: Secondary | ICD-10-CM | POA: Diagnosis not present

## 2021-01-10 DIAGNOSIS — D869 Sarcoidosis, unspecified: Secondary | ICD-10-CM

## 2021-01-10 MED ORDER — SPIRIVA RESPIMAT 2.5 MCG/ACT IN AERS: 2.0000 | INHALATION_SPRAY | Freq: Every day | RESPIRATORY_TRACT | 5 refills | Status: DC

## 2021-01-10 MED ORDER — VARENICLINE TARTRATE 0.5 MG X 11 & 1 MG X 42 PO MISC: ORAL | 0 refills | Status: DC

## 2021-01-10 NOTE — Patient Instructions (Signed)
Will arrange for high resolution CT chest  Will arrange for referral to neurosurgeon to assess neck pain with burning in shoulders and numbness in hands  Stop using breo  Start using spiriva two puffs daily  Start using chantix to help quit smoking.  Uses 0.5 mg daily for 3 days, then 0.5 mg twice per day for the next 4 days.  Set your quit date to stop smoking after using chantix for one week, and then use 1 mg twice per day.  Call for a refill when this script for chantix runs out.  Follow up in 2 to 3 months.

## 2021-01-10 NOTE — Telephone Encounter (Signed)
I had called the pt to give him CT appt info.  I called pt back and gave him CT info.  Nothing further needed.

## 2021-01-10 NOTE — Progress Notes (Signed)
[facility]  Chief Complaint  Patient presents with   Follow-up    SOB with exertion. Some coughing that is unproductive     Past Surgical History:  He  has a past surgical history that includes Laminectomy (2009); Back surgery (2012); Esophagogastroduodenoscopy (03/22/2004); Colonoscopy (11/05/2003); Hip Arthroplasty (Bilateral); [place]vest bone graft (Left); Hip surgery (Left, 2017); Tooth Extraction (Bilateral, 12/28/2017); Resection distal clavical (Right, 01/23/2019); Coronary/Graft Acute MI Revascularization (N/A, 03/20/2019); LEFT HEART CATH AND CORONARY ANGIOGRAPHY (N/A, 03/20/2019); LEFT HEART CATH AND CORONARY ANGIOGRAPHY (N/A, 05/20/2019); and Cardiac catheterization (05/20/2019).  Past Medical History:  Allergies, A, Back pain, CKD, Depression, Anxiety, GERD, Nephrolithiasis, HLD, Migraine headache, Insomnia, Sarcoidosis with liver involvement, OSA, CAD  Constitutional:  BP 130/78 (BP Location: Left Arm, Patient Position: Sitting)   Pulse 86   Temp 98.2 F (36.8 C) (Temporal)   Ht 6\' 1"  (1.854 m)   Wt 248 lb (112.5 kg)   SpO2 99%   BMI 32.72 kg/m   Brief Summary:  Michael [name] is a 60 y.o. male smoker with dyspnea.  He has hx of sarcoidosis from 1998 with lung and liver involvement.      Subjective:   He continue to smoke.  He is smoking 1/2 ppd.  He gets winded after walking 50 feet.  He is worried his sarcoidosis is back.  He feels tight in his chest.  Has cough with clear sputum.  Intermittent wheezing.  Breo helped, but he felt this was too strong.  He uses albuterol twice per day and this helps.  He has persistent pain in his lower neck.  This has been getting worse.  He is not having burning sensation in his shoulders and gets numb feeling in his hands Rt > Lt.   Physical Exam:   Appearance - well kempt   ENMT - no sinus tenderness, no oral exudate, no LAN, Mallampati 3 airway, no stridor  Respiratory - equal breath  sounds bilaterally, no wheezing or rales  CV - s1s2 regular rate and rhythm, no murmurs  Ext - no clubbing, no edema  Skin - no rashes  Psych - normal mood and affect    Pulmonary testing:  PFT 08/11/20 >> FEV1 2.78 (80%), FEV1% 82, TLC 5.58 (75%), DLCO 44%  Chest Imaging:    Cardiac Tests:  Echo 03/21/19 >> EF 60 to 65%, mild LVH  Social History:  He  reports that he has been smoking cigarettes. He has a 14.50 pack-year smoking history. He has never used smokeless tobacco. He reports that he does not currently use alcohol. He reports that he does not use drugs.  Family History:  His family history includes Colon cancer in his brother; Heart failure in his mother.     Assessment/Plan:   Dyspnea, cough, and wheezing. - he had trouble tolerating breo - will change to spiriva respimat two puffs daily - continue prn albuterol - will arrange for high resolution CT chest to assess for recurrence of sarcoidosis or presence of emphysema given diffusion defect on PFT  Tobacco abuse. - will have him try chantix - discussed dosing schedule, setting quit date, and side effects to monitor for  Neck pain with paresthesias. - will arrange for referral to neurosurgery to assess further  Cardioembolic MI. - followed by Dr. Carlyle Dolly with [facility]  Time Spent Involved in Patient Care on Day of Examination:  41 minutes  Follow up:   Patient Instructions  Will arrange for  high resolution CT chest  Will arrange for referral to neurosurgeon to assess neck pain with burning in shoulders and numbness in hands  Stop using breo  Start using spiriva two puffs daily  Start using chantix to help quit smoking.  Uses 0.5 mg daily for 3 days, then 0.5 mg twice per day for the next 4 days.  Set your quit date to stop smoking after using chantix for one week, and then use 1 mg twice per day.  Call for a refill when this script for chantix runs out.  Follow up in 2 to 3  months.  Medication List:   Allergies as of 01/10/2021       Reactions   Methadone Other (See Comments)   REACTION: "Become mentally unstable"        Medication List        Accurate as of January 10, 2021 10:21 AM. If you have any questions, ask your nurse or doctor.          STOP taking these medications    Breo Ellipta 200-25 MCG/INH Aepb Generic drug: fluticasone furoate-vilanterol Stopped by: Chesley Mires, MD       TAKE these medications    acetaminophen 325 MG tablet Commonly known as: TYLENOL Take 2 tablets (650 mg total) by mouth every 4 (four) hours as needed for headache or mild pain.   albuterol (2.5 MG/3ML) 0.083% nebulizer solution Commonly known as: PROVENTIL Take 2.5 mg by nebulization every 6 (six) hours as needed for wheezing or shortness of breath.   albuterol 108 (90 Base) MCG/ACT inhaler Commonly known as: VENTOLIN HFA Inhale 1 puff into the lungs every 6 (six) hours as needed for wheezing or shortness of breath.   amLODipine 5 MG tablet Commonly known as: Norvasc Take 1 tablet (5 mg total) by mouth daily.   ARIPiprazole 10 MG tablet Commonly known as: ABILIFY Take 10 mg by mouth daily.   atorvastatin 20 MG tablet Commonly known as: LIPITOR Take 1 tablet (20 mg total) by mouth daily.   clopidogrel 75 MG tablet Commonly known as: PLAVIX Take 1 tablet (75 mg total) by mouth daily.   cyclobenzaprine 10 MG tablet Commonly known as: FLEXERIL Take 1 tablet by mouth at bedtime as needed.   furosemide 20 MG tablet Commonly known as: LASIX Take 20 mg by mouth daily as needed.   metoprolol succinate 25 MG 24 hr tablet Commonly known as: TOPROL-XL (01/05/21 Hold for 2 weeks.)TAKE 1/2 TABLET BY MOUTH DAILY (MORNING)   nitroGLYCERIN 0.4 MG SL tablet Commonly known as: NITROSTAT DISSOLVE ONE TABLET UNDER TONGUE EVERY 5 MINUTES UP TO 3 DOSES AS NEEDED FOR CHEST PAIN.   ondansetron 4 MG tablet Commonly known as: ZOFRAN Take 4 mg by mouth  every 8 (eight) hours as needed for nausea or vomiting.   oxyCODONE 5 MG immediate release tablet Commonly known as: Oxy IR/ROXICODONE Take 5 mg by mouth as needed for severe pain. Takes 10mg    pramipexole 0.5 MG tablet Commonly known as: MIRAPEX Take 0.5 mg by mouth at bedtime.   pregabalin 225 MG capsule Commonly known as: LYRICA Take 225 mg by mouth daily.   Spiriva Respimat 2.5 MCG/ACT Aers Generic drug: Tiotropium Bromide Monohydrate Inhale 2 puffs into the lungs daily. Started by: Chesley Mires, MD   varenicline 0.5 MG X 11 & 1 MG X 42 tablet Commonly known as: CHANTIX PAK Take one 0.5 mg tablet by mouth once daily for 3 days, then increase to one 0.5  mg tablet twice daily for 4 days, then increase to one 1 mg tablet twice daily. Started by: Chesley Mires, MD   zolpidem 5 MG tablet Commonly known as: AMBIEN Take 10 mg by mouth at bedtime as needed.        Signature:  Chesley Mires, MD [place] Pager - [phone] 01/10/2021, 10:21 AM

## 2021-01-11 ENCOUNTER — Telehealth: Payer: Self-pay | Admitting: Pulmonary Disease

## 2021-01-11 DIAGNOSIS — M542 Cervicalgia: Secondary | ICD-10-CM

## 2021-01-11 NOTE — Telephone Encounter (Signed)
Please see if he can be referred to neurosurgery at [facility], [facility] or [facility]

## 2021-01-11 NOTE — Telephone Encounter (Signed)
VS please advise.     Referral was sent to [facility].  Pt was d/c from their practice in 2020 and they are not able to see this pt.  thanks

## 2021-01-11 NOTE — Telephone Encounter (Signed)
New order placed.  Nothing further is needed.

## 2021-01-29 DIAGNOSIS — J449 Chronic obstructive pulmonary disease, unspecified: Secondary | ICD-10-CM | POA: Diagnosis not present

## 2021-02-07 ENCOUNTER — Ambulatory Visit (HOSPITAL_COMMUNITY)
Admission: RE | Admit: 2021-02-07 | Discharge: 2021-02-07 | Disposition: A | Discharge status: Home / Self Care | Payer: Medicare Other | Source: Ambulatory Visit | Attending: Pulmonary Disease | Admitting: Pulmonary Disease

## 2021-02-07 ENCOUNTER — Other Ambulatory Visit: Payer: Self-pay

## 2021-02-07 DIAGNOSIS — R0600 Dyspnea, unspecified: Secondary | ICD-10-CM | POA: Diagnosis not present

## 2021-02-07 DIAGNOSIS — D869 Sarcoidosis, unspecified: Secondary | ICD-10-CM | POA: Insufficient documentation

## 2021-02-07 DIAGNOSIS — R918 Other nonspecific abnormal finding of lung field: Secondary | ICD-10-CM | POA: Diagnosis not present

## 2021-02-08 DIAGNOSIS — M25511 Pain in right shoulder: Secondary | ICD-10-CM | POA: Diagnosis not present

## 2021-02-08 DIAGNOSIS — M79602 Pain in left arm: Secondary | ICD-10-CM | POA: Diagnosis not present

## 2021-02-08 DIAGNOSIS — M5412 Radiculopathy, cervical region: Secondary | ICD-10-CM | POA: Diagnosis not present

## 2021-02-08 DIAGNOSIS — M5416 Radiculopathy, lumbar region: Secondary | ICD-10-CM | POA: Diagnosis not present

## 2021-02-08 DIAGNOSIS — M79609 Pain in unspecified limb: Secondary | ICD-10-CM | POA: Diagnosis not present

## 2021-02-08 DIAGNOSIS — G4700 Insomnia, unspecified: Secondary | ICD-10-CM | POA: Diagnosis not present

## 2021-02-08 DIAGNOSIS — I219 Acute myocardial infarction, unspecified: Secondary | ICD-10-CM | POA: Diagnosis not present

## 2021-02-08 DIAGNOSIS — M545 Low back pain, unspecified: Secondary | ICD-10-CM | POA: Diagnosis not present

## 2021-02-08 DIAGNOSIS — Z79891 Long term (current) use of opiate analgesic: Secondary | ICD-10-CM | POA: Diagnosis not present

## 2021-03-01 DIAGNOSIS — J449 Chronic obstructive pulmonary disease, unspecified: Secondary | ICD-10-CM | POA: Diagnosis not present

## 2021-03-08 DIAGNOSIS — E559 Vitamin D deficiency, unspecified: Secondary | ICD-10-CM | POA: Diagnosis not present

## 2021-03-08 DIAGNOSIS — R252 Cramp and spasm: Secondary | ICD-10-CM | POA: Diagnosis not present

## 2021-03-08 DIAGNOSIS — R202 Paresthesia of skin: Secondary | ICD-10-CM | POA: Diagnosis not present

## 2021-03-28 ENCOUNTER — Encounter: Payer: Self-pay | Admitting: Pulmonary Disease

## 2021-03-28 ENCOUNTER — Other Ambulatory Visit: Payer: Self-pay

## 2021-03-28 ENCOUNTER — Ambulatory Visit (INDEPENDENT_AMBULATORY_CARE_PROVIDER_SITE_OTHER): Payer: Medicare Other | Admitting: Pulmonary Disease

## 2021-03-28 VITALS — BP 134/88 | HR 78 | Temp 98.4°F | Ht 74.0 in | Wt 240.1 lb

## 2021-03-28 DIAGNOSIS — J411 Mucopurulent chronic bronchitis: Secondary | ICD-10-CM

## 2021-03-28 DIAGNOSIS — Z862 Personal history of diseases of the blood and blood-forming organs and certain disorders involving the immune mechanism: Secondary | ICD-10-CM

## 2021-03-28 DIAGNOSIS — Z720 Tobacco use: Secondary | ICD-10-CM | POA: Diagnosis not present

## 2021-03-28 NOTE — Patient Instructions (Signed)
Follow up in 6 months 

## 2021-03-28 NOTE — Progress Notes (Signed)
[facility]  Chief Complaint  Patient presents with   Follow-up    Sob and cough brown mucus  are about the same since last OV.      Past Surgical History:  He  has a past surgical history that includes Laminectomy (2009); Back surgery (2012); Esophagogastroduodenoscopy (03/22/2004); Colonoscopy (11/05/2003); Hip Arthroplasty (Bilateral); Harvest bone graft (Left); Hip surgery (Left, 2017); Tooth Extraction (Bilateral, 12/28/2017); Resection distal clavical (Right, 01/23/2019); Coronary/Graft Acute MI Revascularization (N/A, 03/20/2019); LEFT HEART CATH AND CORONARY ANGIOGRAPHY (N/A, 03/20/2019); LEFT HEART CATH AND CORONARY ANGIOGRAPHY (N/A, 05/20/2019); and Cardiac catheterization (05/20/2019).  Past Medical History:  Allergies, A, Back pain, CKD, Depression, Anxiety, GERD, Nephrolithiasis, HLD, Migraine headache, Insomnia, Sarcoidosis with liver involvement, OSA, CAD  Constitutional:  BP 134/88    Pulse 78    Temp 98.4 F (36.9 C) (Temporal)    Ht 6\' 2"  (1.88 m)    Wt 240 lb 1.9 oz (108.9 kg)    SpO2 97% Comment: ra   BMI 30.83 kg/m   Brief Summary:  Michael [name] is a 60 y.o. male smoker with dyspnea.  He has hx of sarcoidosis from 1998 with lung and liver involvement.      Subjective:   He tried chantix.  He has too much stress in his life and doesn't feel like he is ready to quit.  His brother has sued him over a land deed.    He has cough with brown sputum.  Not having wheeze or hemoptysis.  Spiriva and albuterol help.  He uses these several times per week.  He wasn't able to keep appointment with neurosurgery.  He plans to follow up with PCP later this month.  CT chest showed minimal changes as an after effect from prior sarcoidosis, but didn't show anything to suggest active disease.   Physical Exam:   Appearance - well kempt   ENMT - no sinus tenderness, no oral exudate, no LAN, Mallampati 3 airway, no stridor  Respiratory  - equal breath sounds bilaterally, no wheezing or rales  CV - s1s2 regular rate and rhythm, no murmurs  Ext - no clubbing, no edema  Skin - no rashes  Psych - normal mood and affect     Pulmonary testing:  PFT 08/11/20 >> FEV1 2.78 (80%), FEV1% 82, TLC 5.58 (75%), DLCO 44%  Chest Imaging:  HRCT chest 02/08/21 >> minimal nodularity and irregular opacities in upper lungs  Cardiac Tests:  Echo 03/21/19 >> EF 60 to 65%, mild LVH  Social History:  He  reports that he has been smoking cigarettes. He has a 14.50 pack-year smoking history. He has never used smokeless tobacco. He reports that he does not currently use alcohol. He reports that he does not use drugs.  Family History:  His family history includes Colon cancer in his brother; Heart failure in his mother.     Assessment/Plan:   Chronic bronchitis. - he had trouble tolerating breo - continue spiriva with prn albuterol  Tobacco abuse. - he will call when he is ready to try quitting cigarettes  Neck pain with paresthesias. - he will follow up with his PCP  Cardioembolic MI. - followed by Dr. Carlyle Dolly with [facility]  Time Spent Involved in Patient Care on Day of Examination:  32 minutes  Follow up:   Patient Instructions  Follow up in 6 months  Medication List:   Allergies as of 03/28/2021       Reactions  Methadone Other (See Comments)   REACTION: "Become mentally unstable"        Medication List        Accurate as of March 28, 2021 10:54 AM. If you have any questions, ask your nurse or doctor.          STOP taking these medications    varenicline 0.5 MG X 11 & 1 MG X 42 tablet Commonly known as: CHANTIX PAK Stopped by: Chesley Mires, MD       TAKE these medications    acetaminophen 325 MG tablet Commonly known as: TYLENOL Take 2 tablets (650 mg total) by mouth every 4 (four) hours as needed for headache or mild pain.   albuterol (2.5 MG/3ML) 0.083% nebulizer  solution Commonly known as: PROVENTIL Take 2.5 mg by nebulization every 6 (six) hours as needed for wheezing or shortness of breath.   albuterol 108 (90 Base) MCG/ACT inhaler Commonly known as: VENTOLIN HFA Inhale 1 puff into the lungs every 6 (six) hours as needed for wheezing or shortness of breath.   amLODipine 5 MG tablet Commonly known as: Norvasc Take 1 tablet (5 mg total) by mouth daily.   ARIPiprazole 10 MG tablet Commonly known as: ABILIFY Take 10 mg by mouth daily.   atorvastatin 20 MG tablet Commonly known as: LIPITOR Take 1 tablet (20 mg total) by mouth daily.   clopidogrel 75 MG tablet Commonly known as: PLAVIX Take 1 tablet (75 mg total) by mouth daily.   cyclobenzaprine 10 MG tablet Commonly known as: FLEXERIL Take 1 tablet by mouth at bedtime as needed.   furosemide 20 MG tablet Commonly known as: LASIX Take 20 mg by mouth daily as needed.   metoprolol succinate 25 MG 24 hr tablet Commonly known as: TOPROL-XL (01/05/21 Hold for 2 weeks.)TAKE 1/2 TABLET BY MOUTH DAILY (MORNING)   nitroGLYCERIN 0.4 MG SL tablet Commonly known as: NITROSTAT DISSOLVE ONE TABLET UNDER TONGUE EVERY 5 MINUTES UP TO 3 DOSES AS NEEDED FOR CHEST PAIN.   ondansetron 4 MG tablet Commonly known as: ZOFRAN Take 4 mg by mouth every 8 (eight) hours as needed for nausea or vomiting.   oxyCODONE 5 MG immediate release tablet Commonly known as: Oxy IR/ROXICODONE Take 5 mg by mouth as needed for severe pain. Takes 10mg    pramipexole 0.5 MG tablet Commonly known as: MIRAPEX Take 0.5 mg by mouth at bedtime.   pregabalin 225 MG capsule Commonly known as: LYRICA Take 225 mg by mouth daily.   Spiriva Respimat 2.5 MCG/ACT Aers Generic drug: Tiotropium Bromide Monohydrate Inhale 2 puffs into the lungs daily.   zolpidem 5 MG tablet Commonly known as: AMBIEN Take 10 mg by mouth at bedtime as needed.        Signature:  Chesley Mires, MD [facility] Pager -  [phone] 03/28/2021, 10:54 AM

## 2021-03-31 DIAGNOSIS — E782 Mixed hyperlipidemia: Secondary | ICD-10-CM | POA: Diagnosis not present

## 2021-03-31 DIAGNOSIS — M5137 Other intervertebral disc degeneration, lumbosacral region: Secondary | ICD-10-CM | POA: Diagnosis not present

## 2021-03-31 DIAGNOSIS — J449 Chronic obstructive pulmonary disease, unspecified: Secondary | ICD-10-CM | POA: Diagnosis not present

## 2021-03-31 DIAGNOSIS — Z79899 Other long term (current) drug therapy: Secondary | ICD-10-CM | POA: Diagnosis not present

## 2021-03-31 DIAGNOSIS — E559 Vitamin D deficiency, unspecified: Secondary | ICD-10-CM | POA: Diagnosis not present

## 2021-03-31 DIAGNOSIS — Z720 Tobacco use: Secondary | ICD-10-CM | POA: Diagnosis not present

## 2021-03-31 DIAGNOSIS — N1832 Chronic kidney disease, stage 3b: Secondary | ICD-10-CM | POA: Diagnosis not present

## 2021-03-31 DIAGNOSIS — N189 Chronic kidney disease, unspecified: Secondary | ICD-10-CM | POA: Diagnosis not present

## 2021-03-31 DIAGNOSIS — M7542 Impingement syndrome of left shoulder: Secondary | ICD-10-CM | POA: Diagnosis not present

## 2021-03-31 DIAGNOSIS — I10 Essential (primary) hypertension: Secondary | ICD-10-CM | POA: Diagnosis not present

## 2021-03-31 DIAGNOSIS — Z23 Encounter for immunization: Secondary | ICD-10-CM | POA: Diagnosis not present

## 2021-07-01 ENCOUNTER — Other Ambulatory Visit: Payer: Self-pay | Admitting: Cardiology

## 2021-07-05 ENCOUNTER — Ambulatory Visit (INDEPENDENT_AMBULATORY_CARE_PROVIDER_SITE_OTHER): Payer: 59 | Admitting: Cardiology

## 2021-07-05 ENCOUNTER — Encounter: Payer: Self-pay | Admitting: *Deleted

## 2021-07-05 ENCOUNTER — Encounter: Payer: Self-pay | Admitting: Cardiology

## 2021-07-05 VITALS — BP 134/84 | HR 76 | Ht 74.0 in | Wt 238.4 lb

## 2021-07-05 DIAGNOSIS — I252 Old myocardial infarction: Secondary | ICD-10-CM | POA: Diagnosis not present

## 2021-07-05 DIAGNOSIS — I10 Essential (primary) hypertension: Secondary | ICD-10-CM | POA: Diagnosis not present

## 2021-07-05 DIAGNOSIS — E782 Mixed hyperlipidemia: Secondary | ICD-10-CM | POA: Diagnosis not present

## 2021-07-05 NOTE — Progress Notes (Signed)
? ? ? ?Clinical Summary ?Mr. Blyth is a 61 y.o.male seen today for follow up of the following medical problems.  ?  ?1. History of embolic STEMI ?- admitted 03/2019 with STEMI ?- cath showed occluded distal LAD and OM1, probable emoblic occlusion. Spontaneous reprfusion of LAD, low level PTCA of distal LCX ?- 03/2019 echo LVEF 60-65%, normal RV ?- unclear source of emboli. No significaint finding by echo. No afib noted during admission ?  ?  ?05/2019 admitted again with chest pain. Trop up to 5300 ?-  cath 05/2019 showed no significant disease. LV gram 30-35% ?- 05/2019 cardiac MRI: LVEF 56%, inferolateral hypokinesis with delayed enhancement consistent with scar. ?  ?  ?  ?- last visit we held toprol due to fatigue to see if helped. Reports symptoms improved off toprol ?-  some recent chest pains, occurs with stress ?- no exertional symptoms.  ?- compliant with meds. Uses prn lasix about 2 times a month ?  ?  ?  ?2. History of sarcoid ?- 05/2019 cardiac MRI without signs of cardiac involvement ?  ?3. Chronic pain ?- followed in pain clinic ?  ?  ?  ?4. Tobacco history ?- 25+ years history ?- has not had PFTs per his report ?  ?- PFTs ordered las visit but appears not completed.  ?- ongoing wheezing, SOB at times ?  ?- abnormal PFTs 08/2020 ?- . followed by Dr Halford Chessman, trial of breo ?  ?  ?7. CKD ?- followed by pcp ? ?8. Hyperlipidemia ?- TC 251 TG 157 HDL 40 LDL 181 ?- mixed compliance with atorvastatin ?Past Medical History:  ?Diagnosis Date  ? Allergic rhinitis   ? Arthritis   ? Chronic low back pain   ? follwed in Pain Clinic(Dr.Kirchmayer)  ? CKD (chronic kidney disease), stage II   ? Depression   ? Depression with anxiety   ? GERD (gastroesophageal reflux disease)   ? History of kidney stones   ? passed  ? HLD (hyperlipidemia) 03/23/2019  ? Insomnia   ? Migraines   ? NSTEMI (non-ST elevated myocardial infarction) [facility])   ? x2  ? Sarcoidosis   ? Multisystem,pulmonary and hepatic (Dr.Rourk)Liver bx in 06 mildly  postive AMA but on re-check normal  ? Sleep apnea   ? "tested, but said it wasnt bad enough for a machine".  ? ? ? ?Allergies  ?Allergen Reactions  ? Methadone Other (See Comments)  ?  REACTION: "Become mentally unstable"  ? ? ? ?Current Outpatient Medications  ?Medication Sig Dispense Refill  ? acetaminophen (TYLENOL) 325 MG tablet Take 2 tablets (650 mg total) by mouth every 4 (four) hours as needed for headache or mild pain.    ? albuterol (PROVENTIL) (2.5 MG/3ML) 0.083% nebulizer solution Take 2.5 mg by nebulization every 6 (six) hours as needed for wheezing or shortness of breath.    ? albuterol (VENTOLIN HFA) 108 (90 Base) MCG/ACT inhaler Inhale 1 puff into the lungs every 6 (six) hours as needed for wheezing or shortness of breath. 8 g 2  ? amLODipine (NORVASC) 5 MG tablet TAKE ONE TABLET BY MOUTH DAILY (MORNING) 30 tablet 6  ? ARIPiprazole (ABILIFY) 10 MG tablet Take 10 mg by mouth daily.    ? atorvastatin (LIPITOR) 20 MG tablet Take 1 tablet (20 mg total) by mouth daily. 30 tablet 3  ? clopidogrel (PLAVIX) 75 MG tablet Take 1 tablet (75 mg total) by mouth daily. 90 tablet 3  ? cyclobenzaprine (FLEXERIL) 10 MG tablet  Take 1 tablet by mouth at bedtime as needed.    ? furosemide (LASIX) 20 MG tablet Take 20 mg by mouth daily as needed.    ? metoprolol succinate (TOPROL-XL) 25 MG 24 hr tablet TAKE 1/2 TABLET BY MOUTH DAILY (MORNING) 45 tablet 2  ? nitroGLYCERIN (NITROSTAT) 0.4 MG SL tablet DISSOLVE ONE TABLET UNDER TONGUE EVERY 5 MINUTES UP TO 3 DOSES AS NEEDED FOR CHEST PAIN. 25 tablet 4  ? ondansetron (ZOFRAN) 4 MG tablet Take 4 mg by mouth every 8 (eight) hours as needed for nausea or vomiting.    ? oxyCODONE (OXY IR/ROXICODONE) 5 MG immediate release tablet Take 5 mg by mouth as needed for severe pain. Takes '10mg'$     ? pramipexole (MIRAPEX) 0.5 MG tablet Take 0.5 mg by mouth at bedtime.    ? pregabalin (LYRICA) 225 MG capsule Take 225 mg by mouth daily.    ? Tiotropium Bromide Monohydrate (SPIRIVA RESPIMAT)  2.5 MCG/ACT AERS Inhale 2 puffs into the lungs daily. 1 each 5  ? zolpidem (AMBIEN) 5 MG tablet Take 10 mg by mouth at bedtime as needed.    ? ?No current facility-administered medications for this visit.  ? ? ? ?Past Surgical History:  ?Procedure Laterality Date  ? BACK SURGERY  2012  ? 2nd back surgery 3 monthes ago and in 2007  ? CARDIAC CATHETERIZATION  05/20/2019  ? COLONOSCOPY  11/05/2003  ? [id number] internal hemorrhoids, otherwise normal rectum and colon  ? CORONARY/GRAFT ACUTE MI REVASCULARIZATION N/A 03/20/2019  ? Procedure: Coronary/Graft Acute MI Revascularization;  Surgeon: Troy Sine, MD;  Location: [facility];  Service: Cardiovascular;  Laterality: N/A;  ? ESOPHAGOGASTRODUODENOSCOPY  03/22/2004  ? WGN:[id number] esophagogastroduodenoscopy. Small hiatal hernia otherwise normal stomach  ? HARVEST BONE GRAFT Left   ? thigh  ? HIP ARTHROPLASTY Bilateral   ? left 2017, right 2016  ? HIP SURGERY Left 2017  ? bone graft  ? LAMINECTOMY  2009  ? Balateral L4-L5  ? LEFT HEART CATH AND CORONARY ANGIOGRAPHY N/A 03/20/2019  ? Procedure: LEFT HEART CATH AND CORONARY ANGIOGRAPHY;  Surgeon: Troy Sine, MD;  Location: [facility];  Service: Cardiovascular;  Laterality: N/A;  ? LEFT HEART CATH AND CORONARY ANGIOGRAPHY N/A 05/20/2019  ? Procedure: LEFT HEART CATH AND CORONARY ANGIOGRAPHY;  Surgeon: Leonie Man, MD;  Location: [facility];  Service: Cardiovascular;  Laterality: N/A;  ? RESECTION DISTAL CLAVICAL Right 01/23/2019  ? Procedure: OPEN DISTAL CLAVICLE EXCISION;  Surgeon: Carole Civil, MD;  Location: [facility];  Service: Orthopedics;  Laterality: Right;  ? TOOTH EXTRACTION Bilateral 12/28/2017  ? Procedure: DENTAL RESTORATION/EXTRACTIONS;  Surgeon: Diona Browner, DDS;  Location: [facility];  Service: Oral Surgery;  Laterality: Bilateral;  ? ? ? ?Allergies  ?Allergen Reactions  ? Methadone Other (See Comments)  ?  REACTION: "Become mentally unstable"  ? ? ? ? ?Family History   ?Problem Relation Age of Onset  ? Colon cancer Brother   ?     in his 75s  ? Heart failure Mother   ? ? ? ?Social History ?Mr. Markham reports that he has been smoking cigarettes. He has a 14.50 pack-year smoking history. He has never used smokeless tobacco. ?Mr. Stitzer reports that he does not currently use alcohol. ? ? ?Review of Systems ?CONSTITUTIONAL: No weight loss, fever, chills, weakness or fatigue.  ?HEENT: Eyes: No visual loss, blurred vision, double vision or yellow sclerae.No hearing loss, sneezing, congestion, runny nose or  sore throat.  ?SKIN: No rash or itching.  ?CARDIOVASCULAR: per hpi ?RESPIRATORY: No shortness of breath, cough or sputum.  ?GASTROINTESTINAL: No anorexia, nausea, vomiting or diarrhea. No abdominal pain or blood.  ?GENITOURINARY: No burning on urination, no polyuria ?NEUROLOGICAL: No headache, dizziness, syncope, paralysis, ataxia, numbness or tingling in the extremities. No change in bowel or bladder control.  ?MUSCULOSKELETAL: No muscle, back pain, joint pain or stiffness.  ?LYMPHATICS: No enlarged nodes. No history of splenectomy.  ?PSYCHIATRIC: No history of depression or anxiety.  ?ENDOCRINOLOGIC: No reports of sweating, cold or heat intolerance. No polyuria or polydipsia.  ?. ? ? ?Physical Examination ?Today's Vitals  ? 07/05/21 1114  ?BP: 134/84  ?Pulse: 76  ?SpO2: 96%  ?Weight: 238 lb 6.4 oz (108.1 kg)  ?Height: '6\' 2"'$  (1.88 m)  ? ?Body mass index is 30.61 kg/m?. ? ?Gen: resting comfortably, no acute distress ?HEENT: no scleral icterus, pupils equal round and reactive, no palptable cervical adenopathy,  ?CV: RRR, no m/r/g no jvd ?Resp: Clear to auscultation bilaterally ?GI: abdomen is soft, non-tender, non-distended, normal bowel sounds, no hepatosplenomegaly ?MSK: extremities are warm, no edema.  ?Skin: warm, no rash ?Neuro:  no focal deficits ?Psych: appropriate affect ? ? ?Diagnostic Studies ? ?03/2019 cath ?Dist LAD lesion is 100% stenosed. ?1st Mrg lesion is 100%  stenosed. ?The left ventricular systolic function is normal. ?The left ventricular ejection fraction is 50-55% by visual estimate. ?Post intervention, there is a 0% residual stenosis. ?  ?Probable embolic occlusion of th

## 2021-07-05 NOTE — Patient Instructions (Addendum)
Medication Instructions:  ?Your physician has recommended you make the following change in your medication:  ?Stop metoprolol ?Continue other medications the same ? ?Labwork: ?none ? ?Testing/Procedures: ?none ? ?Follow-Up: ?Your physician recommends that you schedule a follow-up appointment in: 6 months ? ?Any Other Special Instructions Will Be Listed Below (If Applicable). ? ?If you need a refill on your cardiac medications before your next appointment, please call your pharmacy. ?

## 2021-09-23 ENCOUNTER — Encounter: Payer: Self-pay | Admitting: Pulmonary Disease

## 2021-09-23 ENCOUNTER — Ambulatory Visit (INDEPENDENT_AMBULATORY_CARE_PROVIDER_SITE_OTHER): Payer: 59 | Admitting: Pulmonary Disease

## 2021-09-23 VITALS — BP 134/82 | HR 84 | Temp 98.0°F | Ht 74.0 in | Wt 240.6 lb

## 2021-09-23 DIAGNOSIS — J411 Mucopurulent chronic bronchitis: Secondary | ICD-10-CM

## 2021-09-23 DIAGNOSIS — Z862 Personal history of diseases of the blood and blood-forming organs and certain disorders involving the immune mechanism: Secondary | ICD-10-CM | POA: Diagnosis not present

## 2021-09-23 DIAGNOSIS — Z720 Tobacco use: Secondary | ICD-10-CM | POA: Diagnosis not present

## 2021-09-23 MED ORDER — ALBUTEROL SULFATE HFA 108 (90 BASE) MCG/ACT IN AERS: 1.0000 | INHALATION_SPRAY | Freq: Four times a day (QID) | RESPIRATORY_TRACT | 5 refills | Status: DC | PRN

## 2021-09-23 NOTE — Progress Notes (Signed)
[facility]  Chief Complaint  Patient presents with   Follow-up    SOB and cough are doing better.      Past Surgical History:  He  has a past surgical history that includes Laminectomy (2009); Back surgery (2012); Esophagogastroduodenoscopy (03/22/2004); Colonoscopy (11/05/2003); Hip Arthroplasty (Bilateral); Harvest bone graft (Left); Hip surgery (Left, 2017); Tooth Extraction (Bilateral, 12/28/2017); Resection distal clavical (Right, 01/23/2019); Coronary/Graft Acute MI Revascularization (N/A, 03/20/2019); LEFT HEART CATH AND CORONARY ANGIOGRAPHY (N/A, 03/20/2019); LEFT HEART CATH AND CORONARY ANGIOGRAPHY (N/A, 05/20/2019); and Cardiac catheterization (05/20/2019).  Past Medical History:  Allergies, A, Back pain, CKD, Depression, Anxiety, GERD, Nephrolithiasis, HLD, Migraine headache, Insomnia, Sarcoidosis with liver involvement, OSA, CAD  Constitutional:  BP 134/82 (BP Location: Left Arm, Patient Position: Sitting)   Pulse 84   Temp 98 F (36.7 C) (Temporal)   Ht '6\' 2"'$  (1.88 m)   Wt 240 lb 9.6 oz (109.1 kg)   SpO2 97% Comment: ra  BMI 30.89 kg/m   Brief Summary:  Michael [name] is a 61 y.o. male smoker with dyspnea.  He has hx of sarcoidosis from 1998 with lung and liver involvement.      Subjective:   He has been under a lot of stress related to his family and property inheritance.  This has made it hard for him to keep up with smoking cessation.  He is smoking 1/2 ppd.  He has intermittent cough with clear to brown sputum.  Not having fever, sinus congestion, sore throat, wheezing, hemoptysis, skin rash, or leg swelling.  Sleeping okay.  Keeps up with routine activity without difficulty.  Doesn't have any inhalers at present.   Physical Exam:   Appearance - well kempt   ENMT - no sinus tenderness, no oral exudate, no LAN, Mallampati 3 airway, no stridor  Respiratory - equal breath sounds bilaterally, no wheezing or rales  CV  - s1s2 regular rate and rhythm, no murmurs  Ext - no clubbing, no edema  Skin - no rashes  Psych - normal mood and affect     Pulmonary testing:  PFT 08/11/20 >> FEV1 2.78 (80%), FEV1% 82, TLC 5.58 (75%), DLCO 44%  Chest Imaging:  HRCT chest 02/08/21 >> minimal nodularity and irregular opacities in upper lungs  Cardiac Tests:  Echo 03/21/19 >> EF 60 to 65%, mild LVH  Social History:  He  reports that he has been smoking cigarettes. He has a 14.50 pack-year smoking history. He has never used smokeless tobacco. He reports that he does not currently use alcohol. He reports that he does not use drugs.  Family History:  His family history includes Colon cancer in his brother; Heart failure in his mother.     Assessment/Plan:   Chronic bronchitis. - he had trouble tolerating breo - prn albuterol - if his symptoms progress, then can restart spiriva  Tobacco abuse. - he will try to quit once his family issues are more settled  Cardioembolic MI. - followed by Dr. Carlyle Dolly with [facility]  Time Spent Involved in Patient Care on Day of Examination:  26 minutes  Follow up:   Patient Instructions  Albuterol two puffs every 6 hours as needed for cough, wheeze, chest congestion or shortness of breath  Follow up in 1 year  Medication List:   Allergies as of 09/23/2021       Reactions   Methadone Other (See Comments)   REACTION: "Become mentally unstable"  Medication List        Accurate as of September 23, 2021  8:49 AM. If you have any questions, ask your nurse or doctor.          STOP taking these medications    Spiriva Respimat 2.5 MCG/ACT Aers Generic drug: Tiotropium Bromide Monohydrate Stopped by: Chesley Mires, MD       TAKE these medications    acetaminophen 325 MG tablet Commonly known as: TYLENOL Take 2 tablets (650 mg total) by mouth every 4 (four) hours as needed for headache or mild pain.   albuterol 108 (90 Base) MCG/ACT  inhaler Commonly known as: VENTOLIN HFA Inhale 1 puff into the lungs every 6 (six) hours as needed for wheezing or shortness of breath. What changed: Another medication with the same name was removed. Continue taking this medication, and follow the directions you see here. Changed by: Chesley Mires, MD   amLODipine 5 MG tablet Commonly known as: NORVASC TAKE ONE TABLET BY MOUTH DAILY (MORNING)   ARIPiprazole 10 MG tablet Commonly known as: ABILIFY Take 10 mg by mouth daily.   atorvastatin 20 MG tablet Commonly known as: LIPITOR Take 1 tablet (20 mg total) by mouth daily.   clopidogrel 75 MG tablet Commonly known as: PLAVIX Take 1 tablet (75 mg total) by mouth daily.   cyclobenzaprine 10 MG tablet Commonly known as: FLEXERIL Take 1 tablet by mouth at bedtime as needed.   furosemide 20 MG tablet Commonly known as: LASIX Take 20 mg by mouth daily as needed.   nitroGLYCERIN 0.4 MG SL tablet Commonly known as: NITROSTAT DISSOLVE ONE TABLET UNDER TONGUE EVERY 5 MINUTES UP TO 3 DOSES AS NEEDED FOR CHEST PAIN.   ondansetron 4 MG tablet Commonly known as: ZOFRAN Take 4 mg by mouth every 8 (eight) hours as needed for nausea or vomiting.   oxyCODONE 5 MG immediate release tablet Commonly known as: Oxy IR/ROXICODONE Take 5 mg by mouth as needed for severe pain. Takes '10mg'$    pramipexole 0.5 MG tablet Commonly known as: MIRAPEX Take 0.5 mg by mouth at bedtime.   pregabalin 225 MG capsule Commonly known as: LYRICA Take 225 mg by mouth daily.   zolpidem 5 MG tablet Commonly known as: AMBIEN Take 10 mg by mouth at bedtime as needed.        Signature:  Chesley Mires, MD [place] Pager - [phone] 09/23/2021, 8:49 AM

## 2021-09-23 NOTE — Patient Instructions (Signed)
Albuterol two puffs every 6 hours as needed for cough, wheeze, chest congestion or shortness of breath  Follow up in 1 year

## 2021-10-24 ENCOUNTER — Telehealth: Payer: Self-pay | Admitting: Pulmonary Disease

## 2021-10-24 MED ORDER — ALBUTEROL SULFATE HFA 108 (90 BASE) MCG/ACT IN AERS: 1.0000 | INHALATION_SPRAY | Freq: Four times a day (QID) | RESPIRATORY_TRACT | 5 refills | Status: DC | PRN

## 2021-10-24 MED ORDER — ALBUTEROL SULFATE HFA 108 (90 BASE) MCG/ACT IN AERS: 2.0000 | INHALATION_SPRAY | Freq: Four times a day (QID) | RESPIRATORY_TRACT | 5 refills | Status: DC | PRN

## 2021-10-24 NOTE — Telephone Encounter (Signed)
[name] states order for albuterol needs to be changed to 2 puffs every 6 hours. Rx changed. Nothing further needed.

## 2021-10-24 NOTE — Addendum Note (Signed)
Addended by: Fritzi Mandes D on: 10/24/2021 02:30 PM   Modules accepted: Orders

## 2021-10-24 NOTE — Telephone Encounter (Signed)
Called and notified patient that rescue inhaler was refilled to [name]. He asked if this was the "pump inhaler" I asked his if he was referring to the rescue inhaler and he states it is the one he is instructed to carry everywhere he goes. Confirmed that is his albuterol rescue inhaler and refill was sent to [name]. Advised patient to call back if he needs anything further. Nothing further needed at this time.

## 2021-10-31 ENCOUNTER — Encounter: Payer: Self-pay | Admitting: Cardiology

## 2021-11-16 ENCOUNTER — Other Ambulatory Visit: Payer: Self-pay | Admitting: Cardiology

## 2022-01-06 ENCOUNTER — Ambulatory Visit: Payer: 59 | Attending: Cardiology | Admitting: Cardiology

## 2022-01-06 ENCOUNTER — Encounter: Payer: Self-pay | Admitting: Cardiology

## 2022-01-06 VITALS — BP 132/78 | HR 82 | Ht 74.0 in | Wt 245.2 lb

## 2022-01-06 DIAGNOSIS — E782 Mixed hyperlipidemia: Secondary | ICD-10-CM

## 2022-01-06 DIAGNOSIS — I10 Essential (primary) hypertension: Secondary | ICD-10-CM

## 2022-01-06 DIAGNOSIS — I252 Old myocardial infarction: Secondary | ICD-10-CM | POA: Diagnosis not present

## 2022-01-06 NOTE — Progress Notes (Signed)
Clinical Summary Mr. Khawaja is a 61 y.o.male seen today for follow up of the following medical problems.    1. History of embolic STEMI - admitted 03/2019 with STEMI - cath showed occluded distal LAD and OM1, probable emoblic occlusion. Spontaneous reprfusion of LAD, low level PTCA of distal LCX - 03/2019 echo LVEF 60-65%, normal RV - unclear source of emboli. No significaint finding by echo. No afib noted during admission     05/2019 admitted again with chest pain. Trop up to 5300 -  cath 05/2019 showed no significant disease. LV gram 30-35% - 05/2019 cardiac MRI: LVEF 56%, inferolateral hypokinesis with delayed enhancement consistent with scar.     - some chest pains at times.  - sharp pain midchest, can be let or right sided. Can occur at rest or with activity. 7-8/10 in severity, +SOB, dizzy. Pain lasts few hours 2-3 hours. Not positional. No related to food or eating. Nitro can help at times.    2. Dizziness - typically ocurrs with standing - reports adequate hydration. Does take muscle relaxer bid. Oxycodone only every few days.  - overall not that bothersome.        3. History of sarcoid - 05/2019 cardiac MRI without signs of cardiac involvement   4. Chronic pain - followed in pain clinic       5. Tobacco history - 25+ years history - has not had PFTs per his report   - PFTs ordered las visit but appears not completed.  - ongoing wheezing, SOB at times   - abnormal PFTs 08/2020 - . followed by Dr Halford Chessman,     6. CKD - followed by pcp   7. Hyperlipidemia - TC 251 TG 157 HDL 40 LDL 181 - mixed compliance with atorvastatin  - labs followed by pcp  SH: Two daughters 64 and 53 yo.  Past Medical History:  Diagnosis Date   Allergic rhinitis    Arthritis    Chronic low back pain    follwed in Pain Clinic(Dr.Kirchmayer)   CKD (chronic kidney disease), stage II    Depression    Depression with anxiety    GERD (gastroesophageal reflux disease)    History  of kidney stones    passed   HLD (hyperlipidemia) 03/23/2019   Insomnia    Migraines    NSTEMI (non-ST elevated myocardial infarction) ([facility])    x2   Sarcoidosis    Multisystem,pulmonary and hepatic (Dr.Rourk)Liver bx in 06 mildly postive AMA but on re-check normal   Sleep apnea    "tested, but said it wasnt bad enough for a machine".     Allergies  Allergen Reactions   Methadone Other (See Comments)    REACTION: "Become mentally unstable"     Current Outpatient Medications  Medication Sig Dispense Refill   acetaminophen (TYLENOL) 325 MG tablet Take 2 tablets (650 mg total) by mouth every 4 (four) hours as needed for headache or mild pain.     albuterol (VENTOLIN HFA) 108 (90 Base) MCG/ACT inhaler Inhale 2 puffs into the lungs every 6 (six) hours as needed for wheezing or shortness of breath. 8 g 5   amLODipine (NORVASC) 5 MG tablet TAKE ONE TABLET BY MOUTH DAILY (MORNING) 30 tablet 6   ARIPiprazole (ABILIFY) 10 MG tablet Take 10 mg by mouth daily.     atorvastatin (LIPITOR) 20 MG tablet Take 1 tablet (20 mg total) by mouth daily. 30 tablet 3   clopidogrel (PLAVIX) 75 MG tablet  TAKE ONE TABLET BY MOUTH ONCE DAILY (MORNING) 90 tablet 3   cyclobenzaprine (FLEXERIL) 10 MG tablet Take 1 tablet by mouth at bedtime as needed.     furosemide (LASIX) 20 MG tablet Take 20 mg by mouth daily as needed.     nitroGLYCERIN (NITROSTAT) 0.4 MG SL tablet DISSOLVE ONE TABLET UNDER TONGUE EVERY 5 MINUTES UP TO 3 DOSES AS NEEDED FOR CHEST PAIN. 25 tablet 4   ondansetron (ZOFRAN) 4 MG tablet Take 4 mg by mouth every 8 (eight) hours as needed for nausea or vomiting.     oxyCODONE (OXY IR/ROXICODONE) 5 MG immediate release tablet Take 5 mg by mouth as needed for severe pain. Takes '10mg'$      pramipexole (MIRAPEX) 0.5 MG tablet Take 0.5 mg by mouth at bedtime.     pregabalin (LYRICA) 225 MG capsule Take 225 mg by mouth daily.     zolpidem (AMBIEN) 5 MG tablet Take 10 mg by mouth at bedtime as needed.      No current facility-administered medications for this visit.     Past Surgical History:  Procedure Laterality Date   BACK SURGERY  2012   2nd back surgery 3 monthes ago and in 2007   [facility]  05/20/2019   COLONOSCOPY  11/05/2003   TML:[id number] internal hemorrhoids, otherwise normal rectum and colon   CORONARY/GRAFT ACUTE MI REVASCULARIZATION N/A 03/20/2019   Procedure: Coronary/Graft Acute MI Revascularization;  Surgeon: Troy Sine, MD;  Location: [facility];  Service: Cardiovascular;  Laterality: N/A;   ESOPHAGOGASTRODUODENOSCOPY  03/22/2004   SFK:CLEXNT esophagogastroduodenoscopy. Small hiatal hernia otherwise normal stomach   HARVEST BONE GRAFT Left    thigh   HIP ARTHROPLASTY Bilateral    left 2017, right 2016   HIP SURGERY Left 2017   bone graft   LAMINECTOMY  2009   Balateral L4-L5   LEFT HEART CATH AND CORONARY ANGIOGRAPHY N/A 03/20/2019   Procedure: LEFT HEART CATH AND CORONARY ANGIOGRAPHY;  Surgeon: Troy Sine, MD;  Location: [facility];  Service: Cardiovascular;  Laterality: N/A;   LEFT HEART CATH AND CORONARY ANGIOGRAPHY N/A 05/20/2019   Procedure: LEFT HEART CATH AND CORONARY ANGIOGRAPHY;  Surgeon: Leonie Man, MD;  Location: [facility];  Service: Cardiovascular;  Laterality: N/A;   RESECTION DISTAL CLAVICAL Right 01/23/2019   Procedure: OPEN DISTAL CLAVICLE EXCISION;  Surgeon: Carole Civil, MD;  Location: [facility];  Service: Orthopedics;  Laterality: Right;   TOOTH EXTRACTION Bilateral 12/28/2017   Procedure: DENTAL RESTORATION/EXTRACTIONS;  Surgeon: Diona Browner, DDS;  Location: [facility];  Service: Oral Surgery;  Laterality: Bilateral;     Allergies  Allergen Reactions   Methadone Other (See Comments)    REACTION: "Become mentally unstable"      Family History  Problem Relation Age of Onset   Colon cancer Brother        in his 77s   Heart failure Mother      Social History Mr. Anastacio reports  that he has been smoking cigarettes. He has a 14.50 pack-year smoking history. He has never used smokeless tobacco. Mr. Krueger reports that he does not currently use alcohol.   Review of Systems CONSTITUTIONAL: No weight loss, fever, chills, weakness or fatigue.  HEENT: Eyes: No visual loss, blurred vision, double vision or yellow sclerae.No hearing loss, sneezing, congestion, runny nose or sore throat.  SKIN: No rash or itching.  CARDIOVASCULAR: per hpi RESPIRATORY: No shortness of breath, cough or sputum.  GASTROINTESTINAL:  No anorexia, nausea, vomiting or diarrhea. No abdominal pain or blood.  GENITOURINARY: No burning on urination, no polyuria NEUROLOGICAL: No headache, dizziness, syncope, paralysis, ataxia, numbness or tingling in the extremities. No change in bowel or bladder control.  MUSCULOSKELETAL: No muscle, back pain, joint pain or stiffness.  LYMPHATICS: No enlarged nodes. No history of splenectomy.  PSYCHIATRIC: No history of depression or anxiety.  ENDOCRINOLOGIC: No reports of sweating, cold or heat intolerance. No polyuria or polydipsia.  Marland Kitchen   Physical Examination Today's Vitals   01/06/22 1021 01/06/22 1033  BP: (!) 140/110 (!) 138/90  Pulse: 82   SpO2: 98%   Weight: 245 lb 3.2 oz (111.2 kg)   Height: '6\' 2"'$  (1.88 m)    Body mass index is 31.48 kg/m.  Gen: resting comfortably, no acute distress HEENT: no scleral icterus, pupils equal round and reactive, no palptable cervical adenopathy,  CV: RRR, no mrg, no jvd Resp: Clear to auscultation bilaterally GI: abdomen is soft, non-tender, non-distended, normal bowel sounds, no hepatosplenomegaly MSK: extremities are warm, no edema.  Skin: warm, no rash Neuro:  no focal deficits Psych: appropriate affect   Diagnostic Studies 03/2019 cath Dist LAD lesion is 100% stenosed. 1st Mrg lesion is 100% stenosed. The left ventricular systolic function is normal. The left ventricular ejection fraction is 50-55% by visual  estimate. Post intervention, there is a 0% residual stenosis.   Probable embolic occlusion of the distal OM Daequan Kozma of the circumflex as well as distal LAD.   Normal RCA with superior takeoff.   Low normal global LV function with EF estimated 50 to 55% and focal area of mid anterolateral and focal apical hypocontractility.   Low level PTCA of the distal circumflex with improvement of flow from TIMI 0 to TIMI I in a small caliber distal vessel.   Spontaneous reperfusion of the LAD which ultimately wraps around the LV apex.   RECOMMENDATION: Aggrastat was started due to high suspect for embolic etiology rather than atherosclerotic plaque; continue for 18 hours post procedure.  DAPT with aspirin/Brilinta.  Smoking cessation is imperative.  We will plan for echo Doppler study in a.m.     03/2019 echo IMPRESSIONS      1. Left ventricular ejection fraction, by visual estimation, is 60 to 65%. The left ventricle has normal function. There is mildly increased left ventricular hypertrophy.  2. The left ventricle has no regional wall motion abnormalities.  3. Global right ventricle has normal systolic function.The right ventricular size is normal. No increase in right ventricular wall thickness.  4. Left atrial size was normal.  5. Right atrial size was normal.  6. The mitral valve is normal in structure. No evidence of mitral valve regurgitation.  7. The tricuspid valve is normal in structure. Tricuspid valve regurgitation is not demonstrated.  8. The aortic valve is grossly normal. Aortic valve regurgitation is not visualized. No evidence of aortic valve sclerosis or stenosis.  9. The pulmonic valve was normal in structure. Pulmonic valve regurgitation is not visualized. 10. Technically difficult echo with poor image quality. 11. The atrial septum is grossly normal.     05/2019 cath There is moderate to severe left ventricular systolic dysfunction with regional and global hypokinesis.  Ejection Fraction (EF) appears to be 25-35% by visual estimate. LV end diastolic pressure is mildly elevated. The previously occluded 1st Mrg treated with PTCA is widely patent. Previously occluded Dist LAD lesionis now widely patent Ost RCA lesion is 20% stenosed.   SUMMARY Previously occluded  distal circumflex is widely patent as is the LAD.  Mild ostial RCA but otherwise normal, tortuous vessel. Dilated Left Ventricle with global hypokinesis worse in the inferior and anterior apex.  EF estimated 30 of 35%. Suspect NONISCHEMIC CARDIOMYOPATHY     RECOMMENDATIONS Recommend checking 2D echocardiogram for better assessment of EF and wall motion. Evaluate for causes of Nonischemic Cardiomyopathy with elevated troponin -> consider myocarditis. Further plans per primary service -> would potentially consider cardiac MRI with concern for myocarditis.     08/2019 ABIs Summary:  Right: Resting right ankle-brachial index is within normal range. No  evidence of significant right lower extremity arterial disease. The right  toe-brachial index is normal.   Left: Resting left ankle-brachial index is within normal range. No  evidence of significant left lower extremity arterial disease. The left  toe-brachial index is normal.     Assessment and Plan   1. History of STEMI - appeared to be a cardioembolic MI, did not require stenting - unclear source of thromboembolism. Benign recent 30 day monitor - recent atypical chest pain not cardiac in nature. Discussed trial of ppi but he is not in favor at this time.      2. HTN - essentially right at [facility] - with recent orthostatic dizziness try stopping HCTZ, he reports adequate hydration. Taking lasix sparingly.    3. Hyperlipidemia - encouraged increased compliance with statin, - we will request labs from pcp   F/u 6 months    Arnoldo Lenis, M.D.

## 2022-01-06 NOTE — Patient Instructions (Signed)
Medication Instructions:  Stop HCTZ  Continue all other medications.     Labwork: none  Testing/Procedures: none  Follow-Up: 6 months   Any Other Special Instructions Will Be Listed Below (If Applicable).   If you need a refill on your cardiac medications before your next appointment, please call your pharmacy.

## 2022-01-11 ENCOUNTER — Encounter: Payer: Self-pay | Admitting: *Deleted

## 2022-01-18 ENCOUNTER — Other Ambulatory Visit: Payer: Self-pay | Admitting: Cardiology

## 2022-05-09 ENCOUNTER — Encounter: Payer: Self-pay | Admitting: Neurology

## 2022-05-25 NOTE — Progress Notes (Signed)
Initial neurology clinic note  SERVICE DATE: 06/01/22  Reason for Evaluation: Consultation requested by Coolidge Breeze, FNP for an opinion regarding diffuse pain. My final recommendations will be communicated back to the requesting physician by way of shared medical record or letter to requesting physician via [place] mail.  HPI: This is [name], a 62 y.o. left-handed male with a medical history of pre-diabetes, CAD s/p MI x2, HLD, HTN, lumbosacral spine disease s/p 2 back surgeries, sarcoidosis, anxiety, depression, chronic pain who presents to neurology clinic with the chief complaint of diffuse pain. The patient is alone today.  Patient has muscles spasms, diffuse numbness, burning/needle pains in feet and hands (mostly in left hand). This has been going on in the feet for about 10 years. His hand symptoms have been present for the last few years. He gets cramps throughout the body. Pickle juice helps. He takes flexeril for muscle spasms. He takes Lyrica for 200 mg TID. He sees pain management, but patient has not gotten good relief to date. Patient is not sure what is behind his pain. Patient feels like his symptoms started after his first back surgery. He originally had back surgery due to back pain, but never feels like things improved after.  Patient feels like symptoms are similar on both sides, but maybe worse on the right.  He has significant back and neck pain.  Patient was seen by [facility] previously. The notes mention cervical radiculopathy and/or carpal tunnel syndrome, but not a lot of information. He has had right shoulder surgery, both hips have had surgery, and 2 back surgeries (L4-L5 per patient).  Patient has never had an EMG.  Patient is also on fluoxetine.  He report any constitutional symptoms like fever, night sweats, anorexia or unintentional weight loss.  EtOH use: None  Restrictive diet? No Family history of neuropathy/myopathy/neurologic  disease? None known   MEDICATIONS:  Outpatient Encounter Medications as of 06/01/2022  Medication Sig   albuterol (PROVENTIL) (2.5 MG/3ML) 0.083% nebulizer solution Take 2.5 mg by nebulization every 6 (six) hours as needed.   albuterol (VENTOLIN HFA) 108 (90 Base) MCG/ACT inhaler Inhale 2 puffs into the lungs every 6 (six) hours as needed for wheezing or shortness of breath.   amLODipine (NORVASC) 5 MG tablet TAKE ONE TABLET BY MOUTH DAILY (MORNING)   ARIPiprazole (ABILIFY) 10 MG tablet Take 10 mg by mouth daily.   cyclobenzaprine (FLEXERIL) 10 MG tablet Take 1 tablet by mouth at bedtime as needed.   FLUoxetine (PROZAC) 20 MG capsule Take 20 mg by mouth every morning.   furosemide (LASIX) 20 MG tablet Take 20 mg by mouth daily as needed.   hydrochlorothiazide (HYDRODIURIL) 12.5 MG tablet Take 12.5 mg by mouth daily.   hydrOXYzine (VISTARIL) 25 MG capsule Take 25 mg by mouth every 8 (eight) hours as needed.   lisinopril (ZESTRIL) 5 MG tablet Take 5 mg by mouth daily.   LORazepam (ATIVAN) 0.5 MG tablet Take 0.5 mg by mouth every 6 (six) hours as needed. 1 tablet prn   meclizine (ANTIVERT) 25 MG tablet Take 25 mg by mouth 3 (three) times daily as needed for dizziness.   metFORMIN (GLUCOPHAGE) 500 MG tablet Take 500 mg by mouth 2 (two) times daily with a meal.   metoprolol succinate (TOPROL-XL) 25 MG 24 hr tablet Take 50 mg by mouth every morning.   Nebulizer MISC machine   nitroGLYCERIN (NITROSTAT) 0.4 MG SL tablet DISSOLVE ONE TABLET UNDER TONGUE EVERY 5 MINUTES  UP TO 3 DOSES AS NEEDED FOR CHEST PAIN.   ondansetron (ZOFRAN) 4 MG tablet Take 4 mg by mouth every 8 (eight) hours as needed for nausea or vomiting.   oxyCODONE (OXY IR/ROXICODONE) 5 MG immediate release tablet Take 10 mg by mouth as needed for severe pain. Takes 83m   pregabalin (LYRICA) 225 MG capsule Take 200 mg by mouth in the morning, at noon, and at bedtime.   rosuvastatin (CRESTOR) 20 MG tablet Take 40 mg by mouth at bedtime.    ticagrelor (BRILINTA) 90 MG TABS tablet Take by mouth 2 (two) times daily.   tiZANidine (ZANAFLEX) 4 MG tablet Take 4 mg by mouth every 8 (eight) hours as needed.   trazodone (DESYREL) 300 MG tablet Take 300 mg by mouth at bedtime.   varenicline (CHANTIX) 0.5 MG tablet tab Orally Once a day for 3 days then 0.5 mg twice daily for 4 days then set a quit smoking date after 1 week of chantix then start 180mBID   Vitamin D, Ergocalciferol, 50000 units CAPS Take 1 capsule by mouth once a week.   zolpidem (AMBIEN) 5 MG tablet Take 10 mg by mouth at bedtime as needed.   atorvastatin (LIPITOR) 20 MG tablet Take 1 tablet (20 mg total) by mouth daily. (Patient not taking: Reported on 06/01/2022)   clopidogrel (PLAVIX) 75 MG tablet TAKE ONE TABLET BY MOUTH ONCE DAILY (MORNING) (Patient not taking: Reported on 06/01/2022)   pramipexole (MIRAPEX) 0.5 MG tablet Take 0.5 mg by mouth at bedtime. (Patient not taking: Reported on 06/01/2022)   No facility-administered encounter medications on file as of 06/01/2022.    PAST MEDICAL HISTORY: Past Medical History:  Diagnosis Date   Allergic rhinitis    Arthritis    Chronic low back pain    follwed in Pain Clinic(Dr.Kirchmayer)   CKD (chronic kidney disease), stage II    Depression    Depression with anxiety    GERD (gastroesophageal reflux disease)    History of kidney stones    passed   HLD (hyperlipidemia) 03/23/2019   Insomnia    Migraines    NSTEMI (non-ST elevated myocardial infarction) ([facility]   x2   Sarcoidosis    Multisystem,pulmonary and hepatic (Dr.Rourk)Liver bx in 06 mildly postive AMA but on re-check normal   Sleep apnea    "tested, but said it wasnt bad enough for a machine".    PAST SURGICAL HISTORY: Past Surgical History:  Procedure Laterality Date   BACK SURGERY  2012   2nd back surgery 3 monthes ago and in 2007   [facility]   COLONOSCOPY  11/05/2003   [id number] hemorrhoids, otherwise normal  rectum and colon   CORONARY/GRAFT ACUTE MI REVASCULARIZATION N/A 03/20/2019   Procedure: Coronary/Graft Acute MI Revascularization;  Surgeon: KeTroy SineMD;  [facility];  Service: Cardiovascular;  Laterality: N/A;   ESOPHAGOGASTRODUODENOSCOPY  03/22/2004   [id number]y. Small hiatal hernia otherwise normal stomach   HARVEST BONE GRAFT Left    thigh   HIP ARTHROPLASTY Bilateral    left 2017, right 2016   HIP SURGERY Left 2017   bone graft   LAMINECTOMY  2009   Balateral L4-L5   LEFT HEART CATH AND CORONARY ANGIOGRAPHY N/A 03/20/2019   Procedure: LEFT HEART CATH AND CORONARY ANGIOGRAPHY;  Surgeon: KeTroy SineMD;  [facility];  Service: Cardiovascular;  Laterality: N/A;   LEFT HEART CATH AND CORONARY ANGIOGRAPHY N/A 05/20/2019  Procedure: LEFT HEART CATH AND CORONARY ANGIOGRAPHY;  Surgeon: Leonie Man, MD;  Location: [facility];  Service: Cardiovascular;  Laterality: N/A;   RESECTION DISTAL CLAVICAL Right 01/23/2019   Procedure: OPEN DISTAL CLAVICLE EXCISION;  Surgeon: Carole Civil, MD;  Location: [facility];  Service: Orthopedics;  Laterality: Right;   TOOTH EXTRACTION Bilateral 12/28/2017   Procedure: DENTAL RESTORATION/EXTRACTIONS;  Surgeon: Diona Browner, DDS;  Location: [facility];  Service: Oral Surgery;  Laterality: Bilateral;    ALLERGIES: Allergies  Allergen Reactions   Methadone Other (See Comments)    REACTION: "Become mentally unstable"    FAMILY HISTORY: Family History  Problem Relation Age of Onset   Colon cancer Brother        in his 46s   Heart failure Mother     SOCIAL HISTORY: Social History   Tobacco Use   Smoking status: Every Day    Packs/day: 0.50    Years: 29.00    Total pack years: 14.50    Types: Cigarettes   Smokeless tobacco: Never   Tobacco comments:    Smokes 1/2 pack per day--09/20/20/ 3-4 cigs a day 06/01/22  Vaping Use   Vaping Use: Never used  Substance Use  Topics   Alcohol use: Not Currently   Drug use: No   Social History   Social History Narrative   Are you right handed or left handed? left   Are you currently employed ?    What is your current occupation? disability   Do you live at home alone?yes   Who lives with you?    What type of home do you live in: 1 story or 2 story? one   Caffeine 1 soda a day      OBJECTIVE: PHYSICAL EXAM: BP 132/89   Pulse 90   Ht 6' 2"$  (1.88 m)   Wt 240 lb (108.9 kg)   SpO2 98%   BMI 30.81 kg/m   General: General appearance: Awake and alert. No distress. Cooperative with exam.  Skin: No obvious rash or jaundice. HEENT: Atraumatic. Anicteric. Lungs: Non-labored breathing on room air  Extremities: No edema. No obvious deformity.  Psych: Affect appropriate.  Neurological: Mental Status: Alert. Speech fluent. No pseudobulbar affect Cranial Nerves: CNII: No RAPD. Visual fields grossly intact. CNIII, IV, VI: PERRL. No nystagmus. EOMI. CN V: Facial sensation intact bilaterally to fine touch. CN VII: Facial muscles symmetric and strong. No ptosis at rest. CN VIII: Hearing grossly intact bilaterally. CN IX: No hypophonia. CN X: Palate elevates symmetrically. CN XI: Full strength shoulder shrug bilaterally. CN XII: Tongue protrusion full and midline. No atrophy or fasciculations. No significant dysarthria Motor: Tone is normal. No fasciculations in extremities. No atrophy. Strength 5/5 in bilateral upper and lower extremities. Reflexes:  Right Left   Bicep 2+ 2+   Tricep 2+ 2+   BrRad 2+ 2+   Knee 2+ 2+   Ankle 1+ 1+    Pathological Reflexes: Babinski: flexor response bilaterally Hoffman: absent bilaterally Troemner: absent bilaterally Sensation: Pinprick: Diminished in bilateral lower extremities in a distal to proximal gradient and in bilateral finger tips. Vibration: Absent in bilateral feet, diminished in ankles, present in bilateral patella Proprioception: Intact in  bilateral great toes Coordination: Intact finger-to- nose-finger bilaterally. Romberg negative. Gait: Able to rise from chair with arms crossed unassisted. Normal, narrow-based gait. Able to tandem walk. Able to walk on toes and heels.  Lab and Test Review: External labs: 04/12/22: CBC unremarkable BMP significant for Cr 1.64  TSH (09/15/20): 0.676 A1c (05/05/20): 5.6  Imaging: CT head and cervical spine wo contrast (10/17/16): FINDINGS: CT HEAD FINDINGS   Brain: No evidence of acute infarction, hemorrhage, hydrocephalus, extra-axial collection or mass lesion/mass effect.   Vascular: No hyperdense vessel or unexpected calcification.   Skull: No osseous abnormality.   Sinuses/Orbits: Visualized paranasal sinuses are clear. Visualized mastoid sinuses are clear. Visualized orbits demonstrate no focal abnormality.   Other: None   CT CERVICAL SPINE FINDINGS   Alignment: Normal.   Skull base and vertebrae: No acute fracture. No primary bone lesion or focal pathologic process.   Soft tissues and spinal canal: No prevertebral fluid or swelling. No visible canal hematoma.   Disc levels: Disc spaces are maintained. Broad central disc protrusion at C5-6.   Upper chest: Lung apices are clear.   Other: No fluid collection or hematoma.   IMPRESSION: 1. No acute intracranial pathology. 2. No acute osseous injury of the cervical spine. 3. Broad central disc protrusion at C5-6.  ASSESSMENT: Michael [name] is a 62 y.o. male who presents for evaluation of numbness and tingling in legs and hands and diffuse pain. He has a relevant medical history of pre-diabetes, CAD s/p MI x2, HLD, HTN, lumbosacral spine disease s/p 2 back surgeries, sarcoidosis, anxiety, depression, chronic pain. His neurological examination is pertinent for distal symmetric sensory loss. Available diagnostic data is significant for HbA1c of 5.6 in 2022. Patient's symptoms are most consistent with a distal symmetric  sensory predominant polyneuropathy. There are likely contributions from radiculopathy as well. He has no clear risk factors for neuropathy that have been identified though. I will send labs for treatable causes and get EMG to further evaluate.  PLAN: -Blood work: HbA1c, B12, B1, IFE -EMG: PN (R > L) -Pain control per pain management for now - continue Lyrica for now  -Return to clinic to be determined  The impression above as well as the plan as outlined below were extensively discussed with the patient who voiced understanding. All questions were answered to their satisfaction.  When available, results of the above investigations and possible further recommendations will be communicated to the patient via telephone/MyChart. Patient to call office if not contacted after expected testing turnaround time.   Total time spent reviewing records, interview, history/exam, documentation, and coordination of care on day of encounter:  45 min   Thank you for allowing me to participate in patient's care.  If I can answer any additional questions, I would be pleased to do so.  Kai Levins, MD   CC: Leonie Douglas, MD [address] [place] [address] [place] [address]  CC: Referring provider: Coolidge Breeze, FNP [address],  [place] [address]

## 2022-06-01 ENCOUNTER — Ambulatory Visit (INDEPENDENT_AMBULATORY_CARE_PROVIDER_SITE_OTHER): Payer: 59 | Admitting: Neurology

## 2022-06-01 ENCOUNTER — Other Ambulatory Visit (INDEPENDENT_AMBULATORY_CARE_PROVIDER_SITE_OTHER): Payer: 59

## 2022-06-01 ENCOUNTER — Encounter: Payer: Self-pay | Admitting: Neurology

## 2022-06-01 VITALS — BP 132/89 | HR 90 | Ht 74.0 in | Wt 240.0 lb

## 2022-06-01 DIAGNOSIS — G629 Polyneuropathy, unspecified: Secondary | ICD-10-CM

## 2022-06-01 DIAGNOSIS — M5416 Radiculopathy, lumbar region: Secondary | ICD-10-CM

## 2022-06-01 DIAGNOSIS — Z131 Encounter for screening for diabetes mellitus: Secondary | ICD-10-CM | POA: Diagnosis not present

## 2022-06-01 DIAGNOSIS — R209 Unspecified disturbances of skin sensation: Secondary | ICD-10-CM | POA: Diagnosis not present

## 2022-06-01 DIAGNOSIS — G894 Chronic pain syndrome: Secondary | ICD-10-CM

## 2022-06-01 LAB — HEMOGLOBIN A1C: Hgb A1c MFr Bld: 6.3 % (ref 4.6–6.5)

## 2022-06-01 LAB — VITAMIN B12: Vitamin B-12: 365 pg/mL (ref 211–911)

## 2022-06-01 NOTE — Patient Instructions (Signed)
I would like to investigate your symptoms further to understand what is behind your pain.  I would like to do blood work today.  I would like to do a muscle and nerve test called an EMG (see more information below).  I will be in touch when I have your results and we can discuss next steps.  Continue to see pain management for management of your symptoms (pain medications).  The physicians and staff at [facility] are committed to providing excellent care. You may receive a survey requesting feedback about your experience at our office. We strive to receive "very good" responses to the survey questions. If you feel that your experience would prevent you from giving the office a "very good " response, please contact our office to try to remedy the situation. We may be reached at [phone]. Thank you for taking the time out of your busy day to complete the survey.  Kai Levins, MD [facility]  ELECTROMYOGRAM AND NERVE CONDUCTION STUDIES (EMG/NCS) INSTRUCTIONS  How to Prepare The neurologist conducting the EMG will need to know if you have certain medical conditions. Tell the neurologist and other EMG lab personnel if you: Have a pacemaker or any other electrical medical device Take blood-thinning medications Have hemophilia, a blood-clotting disorder that causes prolonged bleeding Bathing Take a shower or bath shortly before your exam in order to remove oils from your skin. Don't apply lotions or creams before the exam.  What to Expect You'll likely be asked to change into a hospital gown for the procedure and lie down on an examination table. The following explanations can help you understand what will happen during the exam.  Electrodes. The neurologist or a technician places surface electrodes at various locations on your skin depending on where you're experiencing symptoms. Or the neurologist may insert needle electrodes at different sites depending on your symptoms.   Sensations. The electrodes will at times transmit a tiny electrical current that you may feel as a twinge or spasm. The needle electrode may cause discomfort or pain that usually ends shortly after the needle is removed. If you are concerned about discomfort or pain, you may want to talk to the neurologist about taking a short break during the exam.  Instructions. During the needle EMG, the neurologist will assess whether there is any spontaneous electrical activity when the muscle is at rest - activity that isn't present in healthy muscle tissue - and the degree of activity when you slightly contract the muscle.  He or she will give you instructions on resting and contracting a muscle at appropriate times. Depending on what muscles and nerves the neurologist is examining, he or she may ask you to change positions during the exam.  After your EMG You may experience some temporary, minor bruising where the needle electrode was inserted into your muscle. This bruising should fade within several days. If it persists, contact your primary care doctor.

## 2022-06-07 LAB — VITAMIN B1: Vitamin B1 (Thiamine): 8 nmol/L (ref 8–30)

## 2022-06-07 LAB — IMMUNOFIXATION ELECTROPHORESIS
IgG (Immunoglobin G), Serum: 1680 mg/dL — ABNORMAL HIGH (ref 600–1640)
IgM, Serum: 89 mg/dL (ref 50–300)
Immunoglobulin A: 386 mg/dL — ABNORMAL HIGH (ref 47–310)

## 2022-06-10 ENCOUNTER — Encounter (HOSPITAL_COMMUNITY)
Admission: EM | Disposition: A | Discharge status: Home / Self Care | Payer: Self-pay | Source: Home / Self Care | Attending: Internal Medicine

## 2022-06-10 ENCOUNTER — Inpatient Hospital Stay (HOSPITAL_COMMUNITY)
Admission: EM | Admit: 2022-06-10 | Discharge: 2022-06-13 | DRG: 322 | Disposition: A | Discharge status: Home / Self Care | Payer: 59 | Attending: Internal Medicine | Admitting: Internal Medicine

## 2022-06-10 DIAGNOSIS — I214 Non-ST elevation (NSTEMI) myocardial infarction: Secondary | ICD-10-CM

## 2022-06-10 DIAGNOSIS — E1122 Type 2 diabetes mellitus with diabetic chronic kidney disease: Secondary | ICD-10-CM | POA: Diagnosis present

## 2022-06-10 DIAGNOSIS — I129 Hypertensive chronic kidney disease with stage 1 through stage 4 chronic kidney disease, or unspecified chronic kidney disease: Secondary | ICD-10-CM | POA: Diagnosis present

## 2022-06-10 DIAGNOSIS — N183 Chronic kidney disease, stage 3 unspecified: Secondary | ICD-10-CM | POA: Insufficient documentation

## 2022-06-10 DIAGNOSIS — I472 Ventricular tachycardia, unspecified: Secondary | ICD-10-CM | POA: Diagnosis not present

## 2022-06-10 DIAGNOSIS — I255 Ischemic cardiomyopathy: Secondary | ICD-10-CM | POA: Diagnosis present

## 2022-06-10 DIAGNOSIS — Z79899 Other long term (current) drug therapy: Secondary | ICD-10-CM | POA: Diagnosis not present

## 2022-06-10 DIAGNOSIS — I213 ST elevation (STEMI) myocardial infarction of unspecified site: Principal | ICD-10-CM

## 2022-06-10 DIAGNOSIS — I2510 Atherosclerotic heart disease of native coronary artery without angina pectoris: Secondary | ICD-10-CM | POA: Diagnosis present

## 2022-06-10 DIAGNOSIS — Z7984 Long term (current) use of oral hypoglycemic drugs: Secondary | ICD-10-CM

## 2022-06-10 DIAGNOSIS — N1831 Chronic kidney disease, stage 3a: Secondary | ICD-10-CM | POA: Diagnosis not present

## 2022-06-10 DIAGNOSIS — D869 Sarcoidosis, unspecified: Secondary | ICD-10-CM | POA: Diagnosis present

## 2022-06-10 DIAGNOSIS — I2111 ST elevation (STEMI) myocardial infarction involving right coronary artery: Principal | ICD-10-CM | POA: Diagnosis present

## 2022-06-10 DIAGNOSIS — F17200 Nicotine dependence, unspecified, uncomplicated: Secondary | ICD-10-CM | POA: Diagnosis present

## 2022-06-10 DIAGNOSIS — I429 Cardiomyopathy, unspecified: Secondary | ICD-10-CM

## 2022-06-10 DIAGNOSIS — E1141 Type 2 diabetes mellitus with diabetic mononeuropathy: Secondary | ICD-10-CM | POA: Diagnosis present

## 2022-06-10 DIAGNOSIS — Z8249 Family history of ischemic heart disease and other diseases of the circulatory system: Secondary | ICD-10-CM | POA: Diagnosis not present

## 2022-06-10 DIAGNOSIS — F17210 Nicotine dependence, cigarettes, uncomplicated: Secondary | ICD-10-CM | POA: Diagnosis present

## 2022-06-10 DIAGNOSIS — E785 Hyperlipidemia, unspecified: Secondary | ICD-10-CM | POA: Diagnosis present

## 2022-06-10 DIAGNOSIS — E118 Type 2 diabetes mellitus with unspecified complications: Secondary | ICD-10-CM | POA: Insufficient documentation

## 2022-06-10 DIAGNOSIS — I252 Old myocardial infarction: Secondary | ICD-10-CM

## 2022-06-10 DIAGNOSIS — E7841 Elevated Lipoprotein(a): Secondary | ICD-10-CM | POA: Diagnosis not present

## 2022-06-10 HISTORY — PX: CORONARY/GRAFT ACUTE MI REVASCULARIZATION: CATH118305

## 2022-06-10 HISTORY — PX: LEFT HEART CATH AND CORONARY ANGIOGRAPHY: CATH118249

## 2022-06-10 LAB — COMPREHENSIVE METABOLIC PANEL
ALT: 18 U/L (ref 0–44)
AST: 23 U/L (ref 15–41)
Albumin: 3.8 g/dL (ref 3.5–5.0)
Alkaline Phosphatase: 86 U/L (ref 38–126)
Anion gap: 9 (ref 5–15)
BUN: 12 mg/dL (ref 6–20)
CO2: 22 mmol/L (ref 22–32)
Calcium: 9.4 mg/dL (ref 8.9–10.3)
Chloride: 106 mmol/L (ref 98–111)
Creatinine, Ser: 1.78 mg/dL — ABNORMAL HIGH (ref 0.61–1.24)
GFR, Estimated: 43 mL/min — ABNORMAL LOW (ref >60)
Glucose, Bld: 97 mg/dL (ref 70–99)
Potassium: 4.9 mmol/L (ref 3.5–5.1)
Sodium: 137 mmol/L (ref 135–145)
Total Bilirubin: 0.4 mg/dL (ref 0.3–1.2)
Total Protein: 7.6 g/dL (ref 6.5–8.1)

## 2022-06-10 LAB — POCT I-STAT, CHEM 8
BUN: 13 mg/dL (ref 6–20)
Calcium, Ion: 1.23 mmol/L (ref 1.15–1.40)
Chloride: 109 mmol/L (ref 98–111)
Creatinine, Ser: 1.8 mg/dL — ABNORMAL HIGH (ref 0.61–1.24)
Glucose, Bld: 100 mg/dL — ABNORMAL HIGH (ref 70–99)
HCT: 38 % — ABNORMAL LOW (ref 39.0–52.0)
Hemoglobin: 12.9 g/dL — ABNORMAL LOW (ref 13.0–17.0)
Potassium: 4.3 mmol/L (ref 3.5–5.1)
Sodium: 142 mmol/L (ref 135–145)
TCO2: 20 mmol/L — ABNORMAL LOW (ref 22–32)

## 2022-06-10 LAB — APTT: aPTT: 27 seconds (ref 24–36)

## 2022-06-10 LAB — CBC WITH DIFFERENTIAL/PLATELET
Abs Immature Granulocytes: 0.04 10*3/uL (ref 0.00–0.07)
Basophils Absolute: 0.1 10*3/uL (ref 0.0–0.1)
Basophils Relative: 1 %
Eosinophils Absolute: 0.1 10*3/uL (ref 0.0–0.5)
Eosinophils Relative: 2 %
HCT: 41 % (ref 39.0–52.0)
Hemoglobin: 13.5 g/dL (ref 13.0–17.0)
Immature Granulocytes: 0 %
Lymphocytes Relative: 36 %
Lymphs Abs: 3.2 10*3/uL (ref 0.7–4.0)
MCH: 30.7 pg (ref 26.0–34.0)
MCHC: 32.9 g/dL (ref 30.0–36.0)
MCV: 93.2 fL (ref 80.0–100.0)
Monocytes Absolute: 0.8 10*3/uL (ref 0.1–1.0)
Monocytes Relative: 9 %
Neutro Abs: 4.7 10*3/uL (ref 1.7–7.7)
Neutrophils Relative %: 52 %
Platelets: 338 10*3/uL (ref 150–400)
RBC: 4.4 MIL/uL (ref 4.22–5.81)
RDW: 14.8 % (ref 11.5–15.5)
WBC: 9 10*3/uL (ref 4.0–10.5)
nRBC: 0 % (ref 0.0–0.2)

## 2022-06-10 LAB — GLUCOSE, CAPILLARY: Glucose-Capillary: 106 mg/dL — ABNORMAL HIGH (ref 70–99)

## 2022-06-10 LAB — LIPID PANEL
Cholesterol: 263 mg/dL — ABNORMAL HIGH (ref 0–200)
HDL: 36 mg/dL — ABNORMAL LOW (ref >40)
LDL Cholesterol: 205 mg/dL — ABNORMAL HIGH (ref 0–99)
Total CHOL/HDL Ratio: 7.3 RATIO
Triglycerides: 111 mg/dL (ref <150)
VLDL: 22 mg/dL (ref 0–40)

## 2022-06-10 LAB — TROPONIN I (HIGH SENSITIVITY)
Troponin I (High Sensitivity): 13 ng/L (ref <18)
Troponin I (High Sensitivity): 263 ng/L (ref <18)

## 2022-06-10 LAB — PROTIME-INR
INR: 1 (ref 0.8–1.2)
Prothrombin Time: 13.2 seconds (ref 11.4–15.2)

## 2022-06-10 LAB — MRSA NEXT GEN BY PCR, NASAL: MRSA by PCR Next Gen: NOT DETECTED

## 2022-06-10 LAB — POCT ACTIVATED CLOTTING TIME: Activated Clotting Time: 282 seconds

## 2022-06-10 SURGERY — CORONARY/GRAFT ACUTE MI REVASCULARIZATION
Anesthesia: LOCAL

## 2022-06-10 MED ORDER — TIROFIBAN HCL IN NACL 5-0.9 MG/100ML-% IV SOLN
INTRAVENOUS | Status: AC
  Filled 2022-06-10: qty 100

## 2022-06-10 MED ORDER — FENTANYL CITRATE (PF) 100 MCG/2ML IJ SOLN
INTRAMUSCULAR | Status: AC
  Filled 2022-06-10: qty 2

## 2022-06-10 MED ORDER — ASPIRIN 81 MG PO CHEW
81.0000 mg | CHEWABLE_TABLET | Freq: Every day | ORAL | Status: DC
  Administered 2022-06-11 – 2022-06-13 (×3): 81 mg via ORAL
  Filled 2022-06-10 (×3): qty 1

## 2022-06-10 MED ORDER — NITROGLYCERIN 0.4 MG SL SUBL: 0.4000 mg | SUBLINGUAL_TABLET | SUBLINGUAL | Status: DC | PRN

## 2022-06-10 MED ORDER — NITROGLYCERIN IN D5W 200-5 MCG/ML-% IV SOLN
INTRAVENOUS | Status: AC
  Filled 2022-06-10: qty 250

## 2022-06-10 MED ORDER — METOPROLOL SUCCINATE ER 50 MG PO TB24
50.0000 mg | ORAL_TABLET | Freq: Every morning | ORAL | Status: DC
  Administered 2022-06-11: 50 mg via ORAL
  Filled 2022-06-10: qty 1

## 2022-06-10 MED ORDER — LABETALOL HCL 5 MG/ML IV SOLN: 10.0000 mg | INTRAVENOUS | Status: AC | PRN

## 2022-06-10 MED ORDER — IOHEXOL 350 MG/ML SOLN
INTRAVENOUS | Status: DC | PRN
  Administered 2022-06-10: 95 mL

## 2022-06-10 MED ORDER — ROSUVASTATIN CALCIUM 20 MG PO TABS
40.0000 mg | ORAL_TABLET | Freq: Every day | ORAL | Status: DC
  Administered 2022-06-10 – 2022-06-12 (×3): 40 mg via ORAL
  Filled 2022-06-10 (×3): qty 2

## 2022-06-10 MED ORDER — INSULIN ASPART 100 UNIT/ML IJ SOLN
0.0000 [IU] | Freq: Three times a day (TID) | INTRAMUSCULAR | Status: DC
  Administered 2022-06-11 – 2022-06-12 (×2): 2 [IU] via SUBCUTANEOUS

## 2022-06-10 MED ORDER — MIDAZOLAM HCL 2 MG/2ML IJ SOLN
INTRAMUSCULAR | Status: DC | PRN
  Administered 2022-06-10: 1 mg via INTRAVENOUS

## 2022-06-10 MED ORDER — TICAGRELOR 90 MG PO TABS
ORAL_TABLET | ORAL | Status: DC | PRN
  Administered 2022-06-10: 180 mg via ORAL

## 2022-06-10 MED ORDER — TIROFIBAN HCL IN NACL 5-0.9 MG/100ML-% IV SOLN
INTRAVENOUS | Status: AC | PRN
  Administered 2022-06-10: .075 ug/kg/min via INTRAVENOUS

## 2022-06-10 MED ORDER — SODIUM CHLORIDE 0.9 % IV SOLN: 250.0000 mL | INTRAVENOUS | Status: DC | PRN

## 2022-06-10 MED ORDER — ORAL CARE MOUTH RINSE: 15.0000 mL | OROMUCOSAL | Status: DC | PRN

## 2022-06-10 MED ORDER — SODIUM CHLORIDE 0.9 % IV SOLN: INTRAVENOUS | Status: AC

## 2022-06-10 MED ORDER — HEPARIN (PORCINE) IN NACL 1000-0.9 UT/500ML-% IV SOLN
INTRAVENOUS | Status: DC | PRN
  Administered 2022-06-10 (×2): 500 mL

## 2022-06-10 MED ORDER — TIROFIBAN (AGGRASTAT) BOLUS VIA INFUSION
INTRAVENOUS | Status: DC | PRN
  Administered 2022-06-10: 2722.5 ug via INTRAVENOUS

## 2022-06-10 MED ORDER — VERAPAMIL HCL 2.5 MG/ML IV SOLN
INTRAVENOUS | Status: DC | PRN
  Administered 2022-06-10: 10 mL via INTRA_ARTERIAL

## 2022-06-10 MED ORDER — VERAPAMIL HCL 2.5 MG/ML IV SOLN
INTRAVENOUS | Status: AC
  Filled 2022-06-10: qty 2

## 2022-06-10 MED ORDER — LIDOCAINE HCL (PF) 1 % IJ SOLN
INTRAMUSCULAR | Status: AC
  Filled 2022-06-10: qty 30

## 2022-06-10 MED ORDER — LIDOCAINE HCL (PF) 1 % IJ SOLN
INTRAMUSCULAR | Status: DC | PRN
  Administered 2022-06-10: 2 mL via INTRADERMAL

## 2022-06-10 MED ORDER — NITROGLYCERIN 1 MG/10 ML FOR IR/CATH LAB
INTRA_ARTERIAL | Status: DC | PRN
  Administered 2022-06-10: 200 ug via INTRACORONARY

## 2022-06-10 MED ORDER — AMLODIPINE BESYLATE 5 MG PO TABS
5.0000 mg | ORAL_TABLET | Freq: Every day | ORAL | Status: DC
  Administered 2022-06-11: 5 mg via ORAL
  Filled 2022-06-10: qty 1

## 2022-06-10 MED ORDER — HEPARIN SODIUM (PORCINE) 1000 UNIT/ML IJ SOLN
INTRAMUSCULAR | Status: DC | PRN
  Administered 2022-06-10 (×2): 5000 [IU] via INTRAVENOUS

## 2022-06-10 MED ORDER — HEPARIN SODIUM (PORCINE) 1000 UNIT/ML IJ SOLN
INTRAMUSCULAR | Status: AC
  Filled 2022-06-10: qty 10

## 2022-06-10 MED ORDER — SODIUM CHLORIDE 0.9 % IV SOLN: INTRAVENOUS | Status: DC

## 2022-06-10 MED ORDER — MORPHINE SULFATE (PF) 2 MG/ML IV SOLN
2.0000 mg | INTRAVENOUS | Status: DC | PRN
  Filled 2022-06-10: qty 1

## 2022-06-10 MED ORDER — ARIPIPRAZOLE 5 MG PO TABS
10.0000 mg | ORAL_TABLET | Freq: Every day | ORAL | Status: DC
  Administered 2022-06-11 – 2022-06-13 (×3): 10 mg via ORAL
  Filled 2022-06-10 (×2): qty 1
  Filled 2022-06-10: qty 2

## 2022-06-10 MED ORDER — PNEUMOCOCCAL 20-VAL CONJ VACC 0.5 ML IM SUSY: 0.5000 mL | PREFILLED_SYRINGE | INTRAMUSCULAR | Status: DC | PRN

## 2022-06-10 MED ORDER — ONDANSETRON HCL 4 MG/2ML IJ SOLN: 4.0000 mg | Freq: Four times a day (QID) | INTRAMUSCULAR | Status: DC | PRN

## 2022-06-10 MED ORDER — PREGABALIN 100 MG PO CAPS
200.0000 mg | ORAL_CAPSULE | Freq: Three times a day (TID) | ORAL | Status: DC
  Administered 2022-06-10 – 2022-06-13 (×9): 200 mg via ORAL
  Filled 2022-06-10: qty 1
  Filled 2022-06-10: qty 2
  Filled 2022-06-10 (×2): qty 1
  Filled 2022-06-10 (×2): qty 2
  Filled 2022-06-10 (×3): qty 1

## 2022-06-10 MED ORDER — HYDRALAZINE HCL 20 MG/ML IJ SOLN
10.0000 mg | INTRAMUSCULAR | Status: AC | PRN
  Administered 2022-06-10: 10 mg via INTRAVENOUS
  Filled 2022-06-10: qty 1

## 2022-06-10 MED ORDER — INSULIN ASPART 100 UNIT/ML IJ SOLN: 0.0000 [IU] | Freq: Every day | INTRAMUSCULAR | Status: DC

## 2022-06-10 MED ORDER — NITROGLYCERIN 1 MG/10 ML FOR IR/CATH LAB
INTRA_ARTERIAL | Status: AC
  Filled 2022-06-10: qty 10

## 2022-06-10 MED ORDER — TICAGRELOR 90 MG PO TABS
90.0000 mg | ORAL_TABLET | Freq: Two times a day (BID) | ORAL | Status: DC
  Administered 2022-06-11 – 2022-06-13 (×5): 90 mg via ORAL
  Filled 2022-06-10 (×5): qty 1

## 2022-06-10 MED ORDER — ACETAMINOPHEN 325 MG PO TABS: 650.0000 mg | ORAL_TABLET | ORAL | Status: DC | PRN

## 2022-06-10 MED ORDER — ALBUTEROL SULFATE (2.5 MG/3ML) 0.083% IN NEBU: 2.5000 mg | INHALATION_SOLUTION | RESPIRATORY_TRACT | Status: DC | PRN

## 2022-06-10 MED ORDER — NITROGLYCERIN IN D5W 200-5 MCG/ML-% IV SOLN: 0.0000 ug/min | INTRAVENOUS | Status: DC

## 2022-06-10 MED ORDER — SODIUM CHLORIDE 0.9% FLUSH
3.0000 mL | INTRAVENOUS | Status: DC | PRN
  Administered 2022-06-11: 3 mL via INTRAVENOUS

## 2022-06-10 MED ORDER — SODIUM CHLORIDE 0.9% FLUSH
3.0000 mL | Freq: Two times a day (BID) | INTRAVENOUS | Status: DC
  Administered 2022-06-10: 10 mL via INTRAVENOUS
  Administered 2022-06-11 – 2022-06-13 (×5): 3 mL via INTRAVENOUS

## 2022-06-10 MED ORDER — ENOXAPARIN SODIUM 40 MG/0.4ML IJ SOSY
40.0000 mg | PREFILLED_SYRINGE | INTRAMUSCULAR | Status: DC
  Administered 2022-06-11 – 2022-06-12 (×2): 40 mg via SUBCUTANEOUS
  Filled 2022-06-10 (×2): qty 0.4

## 2022-06-10 MED ORDER — TRAZODONE HCL 100 MG PO TABS
300.0000 mg | ORAL_TABLET | Freq: Every day | ORAL | Status: DC
  Administered 2022-06-10 – 2022-06-12 (×3): 300 mg via ORAL
  Filled 2022-06-10: qty 6
  Filled 2022-06-10: qty 3
  Filled 2022-06-10: qty 6

## 2022-06-10 MED ORDER — FENTANYL CITRATE (PF) 100 MCG/2ML IJ SOLN
INTRAMUSCULAR | Status: DC | PRN
  Administered 2022-06-10 (×2): 25 ug via INTRAVENOUS

## 2022-06-10 MED ORDER — ZOLPIDEM TARTRATE 5 MG PO TABS: 10.0000 mg | ORAL_TABLET | Freq: Every evening | ORAL | Status: DC | PRN

## 2022-06-10 MED ORDER — TICAGRELOR 90 MG PO TABS
ORAL_TABLET | ORAL | Status: AC
  Filled 2022-06-10: qty 2

## 2022-06-10 MED ORDER — NITROGLYCERIN IN D5W 200-5 MCG/ML-% IV SOLN
INTRAVENOUS | Status: AC | PRN
  Administered 2022-06-10: 10 ug/min via INTRAVENOUS

## 2022-06-10 MED ORDER — FLUOXETINE HCL 20 MG PO CAPS
20.0000 mg | ORAL_CAPSULE | Freq: Every morning | ORAL | Status: DC
  Administered 2022-06-11 – 2022-06-13 (×3): 20 mg via ORAL
  Filled 2022-06-10 (×3): qty 1

## 2022-06-10 MED ORDER — MIDAZOLAM HCL 2 MG/2ML IJ SOLN
INTRAMUSCULAR | Status: AC
  Filled 2022-06-10: qty 2

## 2022-06-10 MED ORDER — TIROFIBAN HCL IV 12.5 MG/250 ML
0.0750 ug/kg/min | INTRAVENOUS | Status: AC
  Administered 2022-06-10: 0.075 ug/kg/min via INTRAVENOUS
  Filled 2022-06-10: qty 250

## 2022-06-10 SURGICAL SUPPLY — 16 items
BALLN SAPPHIRE 2.0X12 (BALLOONS) ×1
BALLOON SAPPHIRE 2.0X12 (BALLOONS) IMPLANT
CATH LAUNCHER 6FR AL2 (CATHETERS) IMPLANT
CATH LAUNCHER 6FR AR2 (CATHETERS) IMPLANT
CATH OPTITORQUE TIG 4.0 5F (CATHETERS) IMPLANT
CATHETER LAUNCHER 6FR AL2 (CATHETERS) ×1
DEVICE RAD COMP TR BAND LRG (VASCULAR PRODUCTS) IMPLANT
GLIDESHEATH SLEND SS 6F .021 (SHEATH) IMPLANT
GUIDEWIRE INQWIRE 1.5J.035X260 (WIRE) IMPLANT
INQWIRE 1.5J .035X260CM (WIRE) ×1
KIT ENCORE 26 ADVANTAGE (KITS) IMPLANT
KIT HEART LEFT (KITS) ×1 IMPLANT
PACK CARDIAC CATHETERIZATION (CUSTOM PROCEDURE TRAY) ×1 IMPLANT
TRANSDUCER W/STOPCOCK (MISCELLANEOUS) ×1 IMPLANT
TUBING CIL FLEX 10 FLL-RA (TUBING) ×1 IMPLANT
WIRE RUNTHROUGH .014X180CM (WIRE) IMPLANT

## 2022-06-10 NOTE — ED Notes (Signed)
Code stemi. Taken to cath lab with Dr.And at this time. Placed on zoll monitor. Alert and oriented x 4.

## 2022-06-10 NOTE — ED Provider Notes (Signed)
[place] Provider Note   CSN: [id number] Arrival date & time: 06/10/22  1758     History  Chief Complaint  Patient presents with   Code STEMI    Michael [name] is a 62 y.o. male.  Patient here with chest pain that started earlier today.  Diaphoretic.  Got nitroglycerin, aspirin and morphine and Zofran in the field with EMS.  Code STEMI activated in the field.  History of depression, acid reflux, CKD, heart attack in the past.  Denies any weakness or numbness or headache or nausea vomiting diarrhea.  The history is provided by the patient.       Home Medications Prior to Admission medications   Medication Sig Start Date End Date Taking? Authorizing Provider  albuterol (PROVENTIL) (2.5 MG/3ML) 0.083% nebulizer solution Take 2.5 mg by nebulization every 6 (six) hours as needed. 05/31/20   [provider]  albuterol (VENTOLIN HFA) 108 (90 Base) MCG/ACT inhaler Inhale 2 puffs into the lungs every 6 (six) hours as needed for wheezing or shortness of breath. 10/24/21   Chesley Mires, MD  amLODipine (NORVASC) 5 MG tablet TAKE ONE TABLET BY MOUTH DAILY (MORNING) 01/18/22   Branch, Alphonse Guild, MD  ARIPiprazole (ABILIFY) 10 MG tablet Take 10 mg by mouth daily.    [provider]  atorvastatin (LIPITOR) 20 MG tablet Take 1 tablet (20 mg total) by mouth daily. Patient not taking: Reported on 06/01/2022 11/26/20   Arnoldo Lenis, MD  clopidogrel (PLAVIX) 75 MG tablet TAKE ONE TABLET BY MOUTH ONCE DAILY (MORNING) Patient not taking: Reported on 06/01/2022 11/16/21   Arnoldo Lenis, MD  cyclobenzaprine (FLEXERIL) 10 MG tablet Take 1 tablet by mouth at bedtime as needed. 08/31/20   [provider]  FLUoxetine (PROZAC) 20 MG capsule Take 20 mg by mouth every morning. 12/19/21   [provider]  furosemide (LASIX) 20 MG tablet Take 20 mg by mouth daily as needed. 11/26/20   [provider]  hydrochlorothiazide  (HYDRODIURIL) 12.5 MG tablet Take 12.5 mg by mouth daily.    [provider]  hydrOXYzine (VISTARIL) 25 MG capsule Take 25 mg by mouth every 8 (eight) hours as needed.    [provider]  lisinopril (ZESTRIL) 5 MG tablet Take 5 mg by mouth daily.    [provider]  LORazepam (ATIVAN) 0.5 MG tablet Take 0.5 mg by mouth every 6 (six) hours as needed. 1 tablet prn 07/12/20   [provider]  meclizine (ANTIVERT) 25 MG tablet Take 25 mg by mouth 3 (three) times daily as needed for dizziness.    [provider]  metFORMIN (GLUCOPHAGE) 500 MG tablet Take 500 mg by mouth 2 (two) times daily with a meal. 11/02/21   [provider]  metoprolol succinate (TOPROL-XL) 25 MG 24 hr tablet Take 50 mg by mouth every morning. 12/19/21   [provider]  Nebulizer MISC machine 05/31/20   [provider]  nitroGLYCERIN (NITROSTAT) 0.4 MG SL tablet DISSOLVE ONE TABLET UNDER TONGUE EVERY 5 MINUTES UP TO 3 DOSES AS NEEDED FOR CHEST PAIN. 08/06/19   Isaiah Serge, NP  ondansetron (ZOFRAN) 4 MG tablet Take 4 mg by mouth every 8 (eight) hours as needed for nausea or vomiting.    [provider]  oxyCODONE (OXY IR/ROXICODONE) 5 MG immediate release tablet Take 10 mg by mouth as needed for severe pain. Takes '10mg'$     [provider]  pramipexole (  MIRAPEX) 0.5 MG tablet Take 0.5 mg by mouth at bedtime. Patient not taking: Reported on 06/01/2022    [provider]  pregabalin (LYRICA) 225 MG capsule Take 200 mg by mouth in the morning, at noon, and at bedtime.    [provider]  rosuvastatin (CRESTOR) 20 MG tablet Take 40 mg by mouth at bedtime. 12/19/21   [provider]  ticagrelor (BRILINTA) 90 MG TABS tablet Take by mouth 2 (two) times daily.    [provider]  tiZANidine (ZANAFLEX) 4 MG tablet Take 4 mg by mouth every 8 (eight) hours as needed. 03/08/21   [provider]  trazodone (DESYREL)  300 MG tablet Take 300 mg by mouth at bedtime.    [provider]  varenicline (CHANTIX) 0.5 MG tablet tab Orally Once a day for 3 days then 0.5 mg twice daily for 4 days then set a quit smoking date after 1 week of chantix then start '1mg'$  BID    [provider]  Vitamin D, Ergocalciferol, 50000 units CAPS Take 1 capsule by mouth once a week. 12/19/21   [provider]  zolpidem (AMBIEN) 5 MG tablet Take 10 mg by mouth at bedtime as needed. 05/15/19   [provider]      Allergies    Methadone    Review of Systems   Review of Systems  Physical Exam Updated Vital Signs Ht '6\' 2"'$  (1.88 m)   Wt 108.9 kg   BMI 30.81 kg/m  Physical Exam Vitals and nursing note reviewed.  Constitutional:      General: He is not in acute distress.    Appearance: He is well-developed. He is not ill-appearing.  HENT:     Head: Normocephalic and atraumatic.     Nose: Nose normal.     Mouth/Throat:     Mouth: Mucous membranes are moist.  Eyes:     Extraocular Movements: Extraocular movements intact.     Conjunctiva/sclera: Conjunctivae normal.     Pupils: Pupils are equal, round, and reactive to light.  Cardiovascular:     Rate and Rhythm: Normal rate and regular rhythm.     Pulses: Normal pulses.     Heart sounds: Normal heart sounds. No murmur heard. Pulmonary:     Effort: Pulmonary effort is normal. No respiratory distress.     Breath sounds: Normal breath sounds.  Abdominal:     Palpations: Abdomen is soft.     Tenderness: There is no abdominal tenderness.  Musculoskeletal:        General: No swelling.     Cervical back: Normal range of motion and neck supple.  Skin:    General: Skin is warm and dry.     Capillary Refill: Capillary refill takes less than 2 seconds.  Neurological:     General: No focal deficit present.     Mental Status: He is alert.  Psychiatric:        Mood and Affect: Mood normal.     ED Results / Procedures / Treatments   Labs (all  labs ordered are listed, but only abnormal results are displayed) Labs Reviewed  HEMOGLOBIN A1C  CBC WITH DIFFERENTIAL/PLATELET  PROTIME-INR  APTT  COMPREHENSIVE METABOLIC PANEL  LIPID PANEL  TROPONIN I (HIGH SENSITIVITY)    EKG None  Radiology No results found.  Procedures .Critical Care  Performed by: Lennice Sites, DO Authorized by: Lennice Sites, DO   Critical care provider statement:    Critical care time (minutes):  35  Critical care was necessary to treat or prevent imminent or life-threatening deterioration of the following conditions:  Cardiac failure   Critical care was time spent personally by me on the following activities:  Blood draw for specimens, development of treatment plan with patient or surrogate, discussions with primary provider, evaluation of patient's response to treatment, examination of patient, obtaining history from patient or surrogate, ordering and performing treatments and interventions, ordering and review of laboratory studies, ordering and review of radiographic studies, pulse oximetry, re-evaluation of patient's condition and review of old charts   I assumed direction of critical care for this patient from another provider in my specialty: no       Medications Ordered in ED Medications  0.9 %  sodium chloride infusion (has no administration in time range)    ED Course/ Medical Decision Making/ A&P                             Medical Decision Making Amount and/or Complexity of Data Reviewed Labs: ordered. Radiology: ordered.  Risk Prescription drug management.   Michael [name] is here with chest pain.  Code STEMI activated in the field for inferior ST elevation.  I had reviewed EMS EKG recommended a code STEMI to be activated in the field.  History of CKD, NSTEMI.  Dr. Harrell Gave with cardiology at the bedside upon arrival.  Will talk with STEMI doctor, Dr. Saunders Revel.  He has already gotten aspirin morphine and nitroglycerin.  Still  having pain.  Vital signs are normal.  Will order labs including troponin.  Awaiting interventional cardiology team plan.  Dr. Saunders Revel will take the patient to the Cath Lab.  They will do heparin in Cath Lab as they are ready.  Hemodynamically stable throughout my care.  Lab work pending at time of handoff to cardiology team.  This chart was dictated using voice recognition software.  Despite best efforts to proofread,  errors can occur which can change the documentation meaning.         Final Clinical Impression(s) / ED Diagnoses Final diagnoses:  ST elevation myocardial infarction (STEMI), unspecified artery [facility])    Rx / DC Orders ED Discharge Orders     None         Lennice Sites, DO 06/10/22 1806

## 2022-06-10 NOTE — ED Triage Notes (Signed)
Code STEMI. Left sided chest pain starting today radiating to left arm with diaphoresis. 1 SL Nitro taken by pt. '324mg'$  Aspirin, '6mg'$  Morphine and '4mg'$  Zofran given by EMS.

## 2022-06-10 NOTE — H&P (Signed)
Cardiology Admission History and Physical   [name] ID: Michael [name] MRN: [id number]; DOB: 10-27-1960   Admission date: 06/10/2022  PCP:  Leonie Douglas, [place] [facility] Cardiologist:  Carlyle Dolly, MD    Chief Complaint:  Chest pain  [name] Profile:   Michael [name] is a 62 y.o. male with history of coronary artery disease with inferolateral STEMI in 03/2019 found to have occlusions of Michael distal LAD and distal LCx suspicious for coronary emboli, hypertension, hyperlipidemia, sarcoidosis, and chronic kidney disease, who is being seen 06/10/2022 for Michael evaluation of chest pain and abnormal EKG.  History of Present Illness:   Michael [name] was in his usual state of health until approximately 4:30 PM when he had sudden onset of chest pain rating to Michael left arm accompanied by diaphoresis.  Symptoms were similar to what he experienced at Michael time of his STEMI in 03/2019.  He summoned EMS and was found to have inferior ST elevation, prompting activation of Michael STEMI team.  On arrival at [facility], he continues to have 7/10 chest pain rating to Michael left shoulder and arm.  He reports being compliant with his medications, including clopidogrel.  He took some nitroglycerin prior to calling EMS and noticed transient improvement in his chest pain, though it has recurred just.  He was taken for emergent cardiac catheterization, revealing abrupt occlusion of Michael mid RPDA.  Appearance was similar to occlusion seen in 2020 in Michael distal LAD and LCx and remains concerning for coronary embolism.  Balloon angioplasty of Michael RPDA was attempted, though distal flow could not be reestablished.  Chest pain has improved to 4/10 with medical therapy at Michael Clariece Roesler of Michael catheterization.   Past Medical History:  Diagnosis Date   Allergic rhinitis    Arthritis    Chronic low back pain    follwed in Pain [facility](Dr.Kirchmayer)   CKD (chronic kidney disease), stage II    Depression     Depression with anxiety    GERD (gastroesophageal reflux disease)    History of kidney stones    passed   HLD (hyperlipidemia) 03/23/2019   Insomnia    Migraines    NSTEMI (non-ST elevated myocardial infarction) ([place])    x2   Sarcoidosis    Multisystem,pulmonary and hepatic (Dr.Rourk)Liver bx in 06 mildly postive AMA but on re-check normal   Sleep apnea    "tested, but said it wasnt bad enough for a machine".    Past Surgical History:  Procedure Laterality Date   BACK SURGERY  2012   2nd back surgery 3 monthes ago and in 2007   [place]  05/20/2019   COLONOSCOPY  11/05/2003   [id number] internal hemorrhoids, otherwise normal rectum and colon   CORONARY/GRAFT ACUTE MI REVASCULARIZATION N/A 03/20/2019   Procedure: Coronary/Graft Acute MI Revascularization;  Surgeon: Troy Sine, MD;  Location: [facility];  Service: Cardiovascular;  Laterality: N/A;   ESOPHAGOGASTRODUODENOSCOPY  03/22/2004   [id number] esophagogastroduodenoscopy. Small hiatal hernia otherwise normal stomach   HARVEST BONE GRAFT Left    thigh   HIP ARTHROPLASTY Bilateral    left 2017, right 2016   HIP SURGERY Left 2017   bone graft   LAMINECTOMY  2009   Balateral L4-L5   LEFT HEART CATH AND CORONARY ANGIOGRAPHY N/A 03/20/2019   Procedure: LEFT HEART CATH AND CORONARY ANGIOGRAPHY;  Surgeon: Troy Sine, MD;  Location: [facility];  Service: Cardiovascular;  Laterality: N/A;  LEFT HEART CATH AND CORONARY ANGIOGRAPHY N/A 05/20/2019   Procedure: LEFT HEART CATH AND CORONARY ANGIOGRAPHY;  Surgeon: Leonie Man, MD;  Location: [facility];  Service: Cardiovascular;  Laterality: N/A;   RESECTION DISTAL CLAVICAL Right 01/23/2019   Procedure: OPEN DISTAL CLAVICLE EXCISION;  Surgeon: Carole Civil, MD;  Location: [facility];  Service: Orthopedics;  Laterality: Right;   TOOTH EXTRACTION Bilateral 12/28/2017   Procedure: DENTAL RESTORATION/EXTRACTIONS;  Surgeon: Diona Browner, DDS;  Location: [facility];  Service: Oral Surgery;  Laterality: Bilateral;     Medications Prior to Admission: Prior to Admission medications   Medication Sig Start Date [place] Date Taking? Authorizing Provider  albuterol (PROVENTIL) (2.5 MG/3ML) 0.083% nebulizer solution Take 2.5 mg by nebulization every 6 (six) hours as needed. 05/31/20   [provider]  albuterol (VENTOLIN HFA) 108 (90 Base) MCG/ACT inhaler Inhale 2 puffs into Michael lungs every 6 (six) hours as needed for wheezing or shortness of breath. 10/24/21   Chesley Mires, MD  amLODipine (NORVASC) 5 MG tablet TAKE ONE TABLET BY MOUTH DAILY (MORNING) 01/18/22   Branch, Alphonse Guild, MD  ARIPiprazole (ABILIFY) 10 MG tablet Take 10 mg by mouth daily.    [provider]  atorvastatin (LIPITOR) 20 MG tablet Take 1 tablet (20 mg total) by mouth daily. [name]: Reported on 06/01/2022 11/26/20   Arnoldo Lenis, MD  clopidogrel (PLAVIX) 75 MG tablet TAKE ONE TABLET BY MOUTH ONCE DAILY (MORNING) [name]: Reported on 06/01/2022 11/16/21   Arnoldo Lenis, MD  cyclobenzaprine (FLEXERIL) 10 MG tablet Take 1 tablet by mouth at bedtime as needed. 08/31/20   [provider]  FLUoxetine (PROZAC) 20 MG capsule Take 20 mg by mouth every morning. 12/19/21   [provider]  furosemide (LASIX) 20 MG tablet Take 20 mg by mouth daily as needed. 11/26/20   [provider]  hydrochlorothiazide (HYDRODIURIL) 12.5 MG tablet Take 12.5 mg by mouth daily.    [provider]  hydrOXYzine (VISTARIL) 25 MG capsule Take 25 mg by mouth every 8 (eight) hours as needed.    [provider]  lisinopril (ZESTRIL) 5 MG tablet Take 5 mg by mouth daily.    [provider]  LORazepam (ATIVAN) 0.5 MG tablet Take 0.5 mg by mouth every 6 (six) hours as needed. 1 tablet prn 07/12/20   [provider]  meclizine (ANTIVERT) 25 MG tablet Take 25 mg by mouth 3 (three) times daily as needed for  dizziness.    [provider]  metFORMIN (GLUCOPHAGE) 500 MG tablet Take 500 mg by mouth 2 (two) times daily with a meal. 11/02/21   [provider]  metoprolol succinate (TOPROL-XL) 25 MG 24 hr tablet Take 50 mg by mouth every morning. 12/19/21   [provider]  Nebulizer MISC machine 05/31/20   [provider]  nitroGLYCERIN (NITROSTAT) 0.4 MG SL tablet DISSOLVE ONE TABLET UNDER TONGUE EVERY 5 MINUTES UP TO 3 DOSES AS NEEDED FOR CHEST PAIN. 08/06/19   Isaiah Serge, NP  ondansetron (ZOFRAN) 4 MG tablet Take 4 mg by mouth every 8 (eight) hours as needed for nausea or vomiting.    [provider]  oxyCODONE (OXY IR/ROXICODONE) 5 MG immediate release tablet Take 10 mg by mouth as needed for severe pain. Takes '10mg'$     [provider]  pramipexole (MIRAPEX) 0.5 MG tablet Take 0.5 mg by mouth at bedtime. [name]: Reported on 06/01/2022  [provider]  pregabalin (LYRICA) 225 MG capsule Take 200 mg by mouth in Michael morning, at noon, and at bedtime.    [provider]  rosuvastatin (CRESTOR) 20 MG tablet Take 40 mg by mouth at bedtime. 12/19/21   [provider]  ticagrelor (BRILINTA) 90 MG TABS tablet Take by mouth 2 (two) times daily.    [provider]  tiZANidine (ZANAFLEX) 4 MG tablet Take 4 mg by mouth every 8 (eight) hours as needed. 03/08/21   [provider]  trazodone (DESYREL) 300 MG tablet Take 300 mg by mouth at bedtime.    [provider]  varenicline (CHANTIX) 0.5 MG tablet tab Orally Once a day for 3 days then 0.5 mg twice daily for 4 days then set a quit smoking date after 1 week of chantix then start '1mg'$  BID    [provider]  Vitamin D, Ergocalciferol, 50000 units CAPS Take 1 capsule by mouth once a week. 12/19/21   [provider]  zolpidem (AMBIEN) 5 MG tablet Take 10 mg by mouth at bedtime as needed. 05/15/19   [provider]      Allergies:    Allergies  Allergen Reactions   Methadone Other (See Comments)    REACTION: "Become mentally unstable"    Social History:   Social History   Tobacco Use   Smoking status: Every Day    Packs/day: 0.50    Years: 29.00    Total pack years: 14.50    Types: Cigarettes   Smokeless tobacco: Never   Tobacco comments:    Smokes 1/2 pack per day--09/20/20/ 3-4 cigs a day 06/01/22  Vaping Use   Vaping Use: Never used  Substance Use Topics   Alcohol use: Not Currently   Drug use: No    Family History: Michael [name]'s family history includes Colon cancer in his brother; Heart failure in his mother.    ROS:  Review of Systems  Unable to perform ROS: Acuity of condition     Physical Exam/Data:   Vitals:   06/10/22 1800 06/10/22 1805  BP:  (!) 178/108  Pulse:  73  Resp:  11  Temp:  98 F (36.7 C)  TempSrc:  Oral  SpO2:  98%  Weight: 108.9 kg   Height: '6\' 2"'$  (1.88 m)    No intake or output data in Michael 24 hours ending 06/10/22 1946    06/10/2022    6:00 PM 06/01/2022    1:30 PM 01/06/2022   10:21 AM  Last 3 Weights  Weight (lbs) 240 lb 240 lb 245 lb 3.2 oz  Weight (kg) 108.863 kg 108.863 kg 111.222 kg     Body mass index is 30.81 kg/m.  General: Michael [name], lying on gurney in Michael emergency department. HEENT: normal Neck: no JVD Vascular: No carotid bruits; Distal pulses 2+ bilaterally   Cardiac:  normal S1, S2; RRR; no murmurs, rubs, or gallops. Lungs:  clear to auscultation bilaterally, no wheezing, rhonchi or rales  Abd: soft, nontender, no hepatomegaly  Ext: no lower extremity edema. Musculoskeletal:  No deformities, BUE and BLE strength normal and equal Skin: warm and dry  Neuro:  CNs 2-12 intact, no focal abnormalities noted Psych:  Normal affect   EKG:  Michael ECG that was done today at 6:04 PM was personally reviewed and demonstrates normal sinus rhythm with 1 mm inferior ST elevation.  Relevant CV Studies: See today's cath  below.  Cardiac MRI (05/20/2019): 1. Normal left ventricular  size with EF 56%, severe hypokinesis to akinesis of Michael mid inferolateral wall extending into a portion of Michael mid inferior wall. 2.  Normal RV size and systolic function, EF [id number]. 3. Delayed enhancement pattern in Michael inferolateral wall matches Michael hypokinesis on cine images and looks like a coronary disease pattern (prior MI).  Laboratory Data:  High Sensitivity Troponin:  No results for input(s): "TROPONINIHS" in Michael last 720 hours.    Chemistry Recent Labs  Lab 06/10/22 1829  NA 142  K 4.3  CL 109  GLUCOSE 100*  BUN 13  CREATININE 1.80*    No results for input(s): "PROT", "ALBUMIN", "AST", "ALT", "ALKPHOS", "BILITOT" in Michael last 168 hours. Lipids No results for input(s): "CHOL", "TRIG", "HDL", "LABVLDL", "LDLCALC", "CHOLHDL" in Michael last 168 hours. Hematology Recent Labs  Lab 06/10/22 1803 06/10/22 1829  WBC 9.0  --   RBC 4.40  --   HGB 13.5 12.9*  HCT 41.0 38.0*  MCV 93.2  --   MCH 30.7  --   MCHC 32.9  --   RDW 14.8  --   PLT 338  --    Thyroid No results for input(s): "TSH", "FREET4" in Michael last 168 hours. BNPNo results for input(s): "BNP", "PROBNP" in Michael last 168 hours.  DDimer No results for input(s): "DDIMER" in Michael last 168 hours.   Radiology/Studies:  CARDIAC CATHETERIZATION  Result Date: 06/10/2022 Conclusions: Severe single vessel coronary artery disease with occlusion of mid rPDA.  Appearance is similar to distal LAD and LCx occlusions seen in 2020 and may reflect recurrent coronary embolism.  Mild RCA stenosis is similar to prior catheterization in 2021. Mildly elevated left ventricular filling pressure (LVEDP 20 mmHg). Attempted PTCA to rPDA occlusion; flow could not be reestablished despite multiple inflations with a 2.0 x 12 mm balloon. Recommendations: Tirofiban infusion for 18 hours. Titrate nitroglycerin infusion for relief of chest pain. Dual antiplatelet therapy with aspirin and  ticagrelor for at least 12 months. Obtain echocardiogram. Consider hypercoagulable workup and TEE/ILR to assess for potential causes of recurrent coronary occlusions suspicious for emboli. Aggressive secondary prevention of coronary artery disease. Nelva Bush, MD [facility]    Assessment and Plan:   Inferior STEMI: Cath demonstrated abrupt occlusion of mid RPDA, concerning for recurrent coronary embolism, as appearance of Michael lesion is similar to what was noted in Michael distal LCx and distal LAD in 2020.  Flow could not be reestablished in Michael distal RPDA despite attempted angioplasty. -Admit to 2 Heart-ICU for continued observation and medical management. -Continue tirofiban infusion for total of 18 hours. -Titrate nitroglycerin infusion for relief of chest pain.  As needed morphine also available for severe chest discomfort. -Continue aspirin and ticagrelor for at least 12 months. -Obtain echocardiogram. -Consider hypercoagulable workup as well as TEE/ILR placement given recurrent distal coronary occlusions suspicious for embolic events. -Continue rosuvastatin 40 mg daily. -Continue metoprolol succinate 50 mg daily and amlodipine 5 mg daily.  Ischemic cardiomyopathy: LVEF noted to be mildly reduced in Michael past, though cardiac MRI in 2021 showed normal LVEF with evidence of mid inferior/inferolateral scar. -Repeat echocardiogram. -Continue metoprolol succinate 50 mg daily. -Hold lisinopril for now in Michael setting of CKD, contrast exposure, and titration of nitroglycerin for relief of chest pain.  Type 2 diabetes mellitus: -Check hemoglobin A1c. -Hold metformin. -Sliding scale insulin.  Hypertension: -Titrate nitroglycerin infusion for relief of chest pain and blood pressure control. -Continue PTA doses of amlodipine and metoprolol succinate. -Hold lisinopril in Michael setting of  CKD and contrast exposure.  Chronic kidney disease: -Gentle post catheterization hydration, given that  LVEDP was mildly elevated. -Trend renal function following contrast exposure for today's procedure. -Avoid nephrotoxic drugs.  Code status: -Full code (confirmed with [name])  Severity of Illness: Michael appropriate [name] status for this [name] is INPATIENT. Inpatient status is judged to be reasonable and necessary in order to provide Michael required intensity of service to ensure Michael [name]'s safety. Michael [name]'s presenting symptoms, physical exam findings, and initial radiographic and laboratory data in Michael context of their chronic comorbidities is felt to place them at high risk for further clinical deterioration. Furthermore, it is not anticipated that Michael [name] will be medically stable for discharge from Michael hospital within 2 midnights of admission.   * I certify that at Michael point of admission it is my clinical judgment that Michael [name] will require inpatient hospital care spanning beyond 2 midnights from Michael point of admission due to high intensity of service, high risk for further deterioration and high frequency of surveillance required.*   For questions or updates, please contact [place] Please consult [email or web address] for contact info under   Signed, Nelva Bush, MD  06/10/2022 7:46 PM

## 2022-06-11 ENCOUNTER — Inpatient Hospital Stay (HOSPITAL_COMMUNITY): Payer: 59

## 2022-06-11 ENCOUNTER — Other Ambulatory Visit: Payer: Self-pay

## 2022-06-11 DIAGNOSIS — I2111 ST elevation (STEMI) myocardial infarction involving right coronary artery: Secondary | ICD-10-CM

## 2022-06-11 LAB — ECHOCARDIOGRAM COMPLETE
Area-P 1/2: 2.87 cm2
Est EF: 50
Height: 74 in
S' Lateral: 3.8 cm
Single Plane A2C EF: 41.5 %
Weight: 3756.64 oz

## 2022-06-11 LAB — ANTITHROMBIN III: AntiThromb III Func: 92 % (ref 75–120)

## 2022-06-11 LAB — BASIC METABOLIC PANEL
Anion gap: 8 (ref 5–15)
BUN: 12 mg/dL (ref 6–20)
CO2: 20 mmol/L — ABNORMAL LOW (ref 22–32)
Calcium: 8.5 mg/dL — ABNORMAL LOW (ref 8.9–10.3)
Chloride: 109 mmol/L (ref 98–111)
Creatinine, Ser: 1.46 mg/dL — ABNORMAL HIGH (ref 0.61–1.24)
GFR, Estimated: 55 mL/min — ABNORMAL LOW (ref >60)
Glucose, Bld: 117 mg/dL — ABNORMAL HIGH (ref 70–99)
Potassium: 3.9 mmol/L (ref 3.5–5.1)
Sodium: 137 mmol/L (ref 135–145)

## 2022-06-11 LAB — CBC
HCT: 37.1 % — ABNORMAL LOW (ref 39.0–52.0)
Hemoglobin: 12.9 g/dL — ABNORMAL LOW (ref 13.0–17.0)
MCH: 31.1 pg (ref 26.0–34.0)
MCHC: 34.8 g/dL (ref 30.0–36.0)
MCV: 89.4 fL (ref 80.0–100.0)
Platelets: 326 10*3/uL (ref 150–400)
RBC: 4.15 MIL/uL — ABNORMAL LOW (ref 4.22–5.81)
RDW: 14.7 % (ref 11.5–15.5)
WBC: 10.9 10*3/uL — ABNORMAL HIGH (ref 4.0–10.5)
nRBC: 0 % (ref 0.0–0.2)

## 2022-06-11 LAB — GLUCOSE, CAPILLARY
Glucose-Capillary: 193 mg/dL — ABNORMAL HIGH (ref 70–99)
Glucose-Capillary: 117 mg/dL — ABNORMAL HIGH (ref 70–99)
Glucose-Capillary: 133 mg/dL — ABNORMAL HIGH (ref 70–99)
Glucose-Capillary: 124 mg/dL — ABNORMAL HIGH (ref 70–99)

## 2022-06-11 MED ORDER — AMLODIPINE BESYLATE 5 MG PO TABS
5.0000 mg | ORAL_TABLET | Freq: Once | ORAL | Status: AC
  Administered 2022-06-11: 5 mg via ORAL
  Filled 2022-06-11: qty 1

## 2022-06-11 MED ORDER — AMLODIPINE BESYLATE 10 MG PO TABS: 10.0000 mg | ORAL_TABLET | Freq: Every day | ORAL | Status: DC

## 2022-06-11 MED ORDER — CHLORHEXIDINE GLUCONATE CLOTH 2 % EX PADS
6.0000 | MEDICATED_PAD | Freq: Every day | CUTANEOUS | Status: DC
  Administered 2022-06-11 – 2022-06-13 (×3): 6 via TOPICAL

## 2022-06-11 NOTE — Progress Notes (Addendum)
Rounding Note    Patient Name: Michael [name] Date of Encounter: 06/11/2022  [place] Cardiologist: Carlyle Dolly, MD   Subjective   Patient is doing well today with no recurrent chest pain since cath. No shortness of breath. He does report some left arm pain that he describes as a cramping sensation but he states he has had this for year and was told he has a "pinched nerve in his neck." No palpitations.  He has some tenderness with palpation of abdomen but no other GI symptoms.   Inpatient Medications    Scheduled Meds:  amLODipine  5 mg Oral Daily   ARIPiprazole  10 mg Oral Daily   aspirin  81 mg Oral Daily   Chlorhexidine Gluconate Cloth  6 each Topical Daily   enoxaparin (LOVENOX) injection  40 mg Subcutaneous Q24H   FLUoxetine  20 mg Oral q morning   insulin aspart  0-15 Units Subcutaneous TID WC   insulin aspart  0-5 Units Subcutaneous QHS   metoprolol succinate  50 mg Oral q morning   pregabalin  200 mg Oral TID   rosuvastatin  40 mg Oral QHS   sodium chloride flush  3 mL Intravenous Q12H   ticagrelor  90 mg Oral BID   trazodone  300 mg Oral QHS   Continuous Infusions:  sodium chloride     nitroGLYCERIN Stopped (06/11/22 0649)   tirofiban 0.075 mcg/kg/min (06/11/22 1200)   PRN Meds: sodium chloride, acetaminophen, albuterol, morphine injection, nitroGLYCERIN, ondansetron (ZOFRAN) IV, mouth rinse, pneumococcal 20-valent conjugate vaccine, sodium chloride flush, zolpidem   Vital Signs    Vitals:   06/11/22 1000 06/11/22 1100 06/11/22 1120 06/11/22 1200  BP: (!) 163/89   (!) 142/80  Pulse: 64 74 79 67  Resp: 14 20 (!) 21 14  Temp:   98.2 F (36.8 C)   TempSrc:   Oral   SpO2: 98% 98% 98% 96%  Weight:      Height:        Intake/Output Summary (Last 24 hours) at 06/11/2022 1456 Last data filed at 06/11/2022 1200 Gross per 24 hour  Intake 1147.19 ml  Output 1050 ml  Net 97.19 ml      06/10/2022    9:00 PM 06/10/2022    6:00 PM 06/01/2022     1:30 PM  Last 3 Weights  Weight (lbs) 234 lb 12.6 oz 240 lb 240 lb  Weight (kg) 106.5 kg 108.863 kg 108.863 kg      Telemetry    Normal sinus rhythm with rates in the 60s to 70s. Frequent ventricular ectopy and short runs of NSVT (longest run being 9 beats). - Personally Reviewed  ECG    Normal sinus rhythm with improvement of ST elevation in inferior leads.  - Personally Reviewed  Physical Exam   GEN: African-American male resting comfortably in no acute distress.   Neck: No JVD. Cardiac: RRR. No murmurs, rubs, or gallops. Right radial cath site soft with no signs of hematoma. Respiratory: Clear to auscultation bilaterally. No wheezes, rhonchi, or rales. GI: Soft and non-distended. Mildly tender to palpation.  MS: No lower extremity edema. No deformity. Skin: Warm and dry. Neuro:  No focal deficits. Psych: Normal affect. Responds appropriately.   Labs    High Sensitivity Troponin:   Recent Labs  Lab 06/10/22 1803 06/10/22 2043  TROPONINIHS 13 263*     Chemistry Recent Labs  Lab 06/10/22 1803 06/10/22 1829 06/11/22 0147  NA 137 142 137  K 4.9 4.3 3.9  CL 106 109 109  CO2 22  --  20*  GLUCOSE 97 100* 117*  BUN '12 13 12  '$ CREATININE 1.78* 1.80* 1.46*  CALCIUM 9.4  --  8.5*  PROT 7.6  --   --   ALBUMIN 3.8  --   --   AST 23  --   --   ALT 18  --   --   ALKPHOS 86  --   --   BILITOT 0.4  --   --   GFRNONAA 43*  --  55*  ANIONGAP 9  --  8    Lipids  Recent Labs  Lab 06/10/22 1803  CHOL 263*  TRIG 111  HDL 36*  LDLCALC 205*  CHOLHDL 7.3    Hematology Recent Labs  Lab 06/10/22 1803 06/10/22 1829 06/11/22 0147  WBC 9.0  --  10.9*  RBC 4.40  --  4.15*  HGB 13.5 12.9* 12.9*  HCT 41.0 38.0* 37.1*  MCV 93.2  --  89.4  MCH 30.7  --  31.1  MCHC 32.9  --  34.8  RDW 14.8  --  14.7  PLT 338  --  326   Thyroid No results for input(s): "TSH", "FREET4" in the last 168 hours.  BNPNo results for input(s): "BNP", "PROBNP" in the last 168 hours.  DDimer No  results for input(s): "DDIMER" in the last 168 hours.   Radiology    CARDIAC CATHETERIZATION  Result Date: 06/10/2022 Conclusions: Severe single vessel coronary artery disease with occlusion of mid rPDA.  Appearance is similar to distal LAD and LCx occlusions seen in 2020 and may reflect recurrent coronary embolism.  Mild RCA stenosis is similar to prior catheterization in 2021. Mildly elevated left ventricular filling pressure (LVEDP 20 mmHg). Attempted PTCA to rPDA occlusion; flow could not be reestablished despite multiple inflations with a 2.0 x 12 mm balloon. Recommendations: Tirofiban infusion for 18 hours. Titrate nitroglycerin infusion for relief of chest pain. Dual antiplatelet therapy with aspirin and ticagrelor for at least 12 months. Obtain echocardiogram. Consider hypercoagulable workup and TEE/ILR to assess for potential causes of recurrent coronary occlusions suspicious for emboli. Aggressive secondary prevention of coronary artery disease. Nelva Bush, MD [facility]   Cardiac Studies   Left Cardiac Catheterization 06/10/2022: Conclusions: Severe single vessel coronary artery disease with occlusion of mid rPDA.  Appearance is similar to distal LAD and LCx occlusions seen in 2020 and may reflect recurrent coronary embolism.  Mild RCA stenosis is similar to prior catheterization in 2021. Mildly elevated left ventricular filling pressure (LVEDP 20 mmHg). Attempted PTCA to rPDA occlusion; flow could not be reestablished despite multiple inflations with a 2.0 x 12 mm balloon.   Recommendations: Tirofiban infusion for 18 hours. Titrate nitroglycerin infusion for relief of chest pain. Dual antiplatelet therapy with aspirin and ticagrelor for at least 12 months. Obtain echocardiogram. Consider hypercoagulable workup and TEE/ILR to assess for potential causes of recurrent coronary occlusions suspicious for emboli. Aggressive secondary prevention of coronary artery disease.    Diagnostic Dominance: Right  Intervention    Patient Profile     62 y.o. male with a history of CAD with inferolateral STEMI in 03/2019 found to have occlusions of the distal LAD and distal LCX suspicious for coronary emboli, multisystem sarcoidosis (pulmonary and hepatic), hypertension, hyperlipidemia, CKD stage III, and chronic back pain who was admitted on 06/10/2022 for acute STEMI after presenting with chest pain.  Assessment & Plan    Acute Inferior  STEMI CAD Patient has a history of an inferolateral STEMI in 05/2019 and was found to have occlusions of the distal LAD and distal LCX suspicious for coronary emboli, He presented with chest pain and EKG showed inferior ST elevations. High-sensitivity troponin 13 >> 263. Emergent cardiac catheterization showed severe single vessel CAD with occlusion of the mid rPDA. Appearance is similar to the distal LAD and LCX occlusion seen in 2020 and may reflect recurrent coronary embolism. Otherwise, cath showed mild stenosis of proximal RCA similar to prior cath. PTCA to the rPDA occlusion was attempted but flow could not be re-established despite multiple inflations.  - No recurrent chest pain. - Continue Tirofiban for total of 18 hours. - Looks like IV Nitroglycerin has been weaned off. Can restart if needed. - Continue DAPT with Aspirin and Brilinta for at least 12 months.  - Continue beta-blocker and high-intensity statin. - Will order hypercoagulable work-up. Will also discuss TEE and possible loop recorder placement with MD given recurrent distal coronary occlusions suspicious for embolic events.   Ischemic Cardiomyopathy LVEF noted to be 25-35% on cath in 03/2019. However, cardiac MRI at that time showed LVEF of 56% with severe hypokinesis to akinesis of the mid inferolateral wall extending into a portion of the mid inferior wall. Echo this admission showed LVEF of 50% with akinesis of the mid and distal lateral wall and hypokinesis of mid  inferior segment. LVEDP mildly elevated at 20 mmHg on cath. - He does not look significant volume overloaded on exam. - Continue home Toprol-XL daily.  - Home Lisinopril on hold due to renal function. Can consider restarting tomorrow if renal function allows. - On HCTZ at home but could consider starting Spironolactone instead for GDMT.  Non-Sustained VT Frequent PVC Telemetry shows frequent ventricular ectopy and short runs of NSVT with longest run being 9 beats.  - Patient is asymptomatic with this.  - Potassium 3.9. Will check Magnesium. - Continue Toprol-XL '50mg'$  daily. Baseline rates are often in the low 60s so unable to up-titrate this.  Hypertension BP mildly elevated this afternoon.  - Continue Toprol-XL '50mg'$  daily. - Currently on Amlodipine '5mg'$  daily. Will give another '5mg'$  now and then will increase to '10mg'$  starting tomorrow.  - Restart ACEi/ARB tomorrow if renal function allows.  Hyperlipidemia Lipid panel this admission: Total Cholesterol 263, Triglycerides 111, HDL 36, LDL 205. LDL goal <55.  - Continue Crestor '40mg'$  daily. Patient is not taking this consistently at home. States he is only taking it about twice a week. Emphasized the importance of taking this as prescribed.  - Briefly mentioned possibly needing a PCSK9 inhibitor but he is adamant that he will not take any injectable medications.  Type 2 Diabetes Mellitus  - Home Metformin on hold. - Continue sliding scale insulin while inpatient. - Hemoglobin A1c pending.  CKD Stage III Creatinine 1.80 on admission. No labs since 2021. - Improved to 1.46 today. - Continue to monitor closely.  Tobacco Use Patient currently smokes 3 cigarettes per day. - Discussed importance of complete cessation.    For questions or updates, please contact [place] Please consult [email or web address] for contact info under        Signed, Darreld Mclean, PA-C  06/11/2022, 2:56 PM    Patient seen and examined with Sande Rives, PA-C.  Agree as above, with the following exceptions and changes as noted below.  Patient is doing well, seen in ICU bed 2H18.. Gen: NAD, CV: RRR, no murmurs, Lungs: clear, Abd:  soft, Extrem: Warm, well perfused, no edema, Neuro/Psych: alert and oriented x 3, normal mood and affect. All available labs, radiology testing, previous records reviewed.  Echocardiogram reviewed, LVEF borderline normal with regional wall motion abnormalities that are not dissimilar from prior cardiac MRI notation.  Patient is noted to have a probable embolic inferior MI and hypercoagulable workup will be initiated.  Would be reasonable to get a TEE as well, will need consent tomorrow. LDL very high, does not take crestor regularly. Smoking history. All contributors I suspect.  Overall doing well currently, will uptitrate amlodipine for better bp control.  Elouise Munroe, MD 06/11/22 4:58 PM

## 2022-06-11 NOTE — Progress Notes (Signed)
  Echocardiogram 2D Echocardiogram has been performed.  Michael [name] 06/11/2022, 3:43 PM

## 2022-06-11 NOTE — Progress Notes (Signed)
PHARMACY CONSULT NOTE - Initial Consult  Pharmacy Consult for tirofiban Indication:  ACS  Allergies  Allergen Reactions   Methadone Other (See Comments)    REACTION: "Become mentally unstable"    Patient Measurements: Height: '6\' 2"'$  (188 cm) Weight: 106.5 kg (234 lb 12.6 oz) IBW/kg (Calculated) : 82.2   Vital Signs: Temp: 98.2 F (36.8 C) (03/03 1120) Temp Source: Oral (03/03 1120) BP: 93/65 (03/03 0700) Pulse Rate: 76 (03/03 0700)  Labs: Recent Labs    06/10/22 1803 06/10/22 1829 06/10/22 2043 06/11/22 0147  HGB 13.5 12.9*  --  12.9*  HCT 41.0 38.0*  --  37.1*  PLT 338  --   --  326  APTT 27  --   --   --   LABPROT 13.2  --   --   --   INR 1.0  --   --   --   CREATININE 1.78* 1.80*  --  1.46*  TROPONINIHS 13  --  263*  --     Estimated Creatinine Clearance: 70.8 mL/min (A) (by C-G formula based on SCr of 1.46 mg/dL (H)).   Medical History: Past Medical History:  Diagnosis Date   Allergic rhinitis    Arthritis    Chronic low back pain    follwed in [facility](Dr.Kirchmayer)   CKD (chronic kidney disease), stage II    Depression    Depression with anxiety    GERD (gastroesophageal reflux disease)    History of kidney stones    passed   HLD (hyperlipidemia) 03/23/2019   Insomnia    Migraines    NSTEMI (non-ST elevated myocardial infarction) ([place])    x2   Sarcoidosis    Multisystem,pulmonary and hepatic (Dr.Rourk)Liver bx in 06 mildly postive AMA but on re-check normal   Sleep apnea    "tested, but said it wasnt bad enough for a machine".    Medications:  Infusions:   sodium chloride     nitroGLYCERIN Stopped (06/11/22 0649)   tirofiban 0.075 mcg/kg/min (06/11/22 0700)    Assessment: 62 yo male s/p cath lab on tirofiban x 18 hrs.  CBC stable.  Goal of Therapy:   Monitor platelets by anticoagulation protocol: Yes   Plan:  Tirofiban 0.075 mcg/kg/min to continue through ~ 3 pm today.  [place] Crane, Roylene Reason, BCCP Clinical  Pharmacist  06/11/2022 12:03 PM   [facility] pharmacy phone numbers are listed on [email or web address]

## 2022-06-12 ENCOUNTER — Other Ambulatory Visit (HOSPITAL_COMMUNITY): Payer: Self-pay

## 2022-06-12 ENCOUNTER — Encounter (HOSPITAL_COMMUNITY): Payer: Self-pay | Admitting: Internal Medicine

## 2022-06-12 ENCOUNTER — Inpatient Hospital Stay (HOSPITAL_COMMUNITY): Payer: 59

## 2022-06-12 DIAGNOSIS — E785 Hyperlipidemia, unspecified: Secondary | ICD-10-CM

## 2022-06-12 DIAGNOSIS — N1831 Chronic kidney disease, stage 3a: Secondary | ICD-10-CM

## 2022-06-12 DIAGNOSIS — I255 Ischemic cardiomyopathy: Secondary | ICD-10-CM

## 2022-06-12 DIAGNOSIS — I2111 ST elevation (STEMI) myocardial infarction involving right coronary artery: Secondary | ICD-10-CM | POA: Diagnosis not present

## 2022-06-12 LAB — HEMOGLOBIN A1C
Hgb A1c MFr Bld: 6.1 % — ABNORMAL HIGH (ref 4.8–5.6)
Mean Plasma Glucose: 128 mg/dL

## 2022-06-12 LAB — BASIC METABOLIC PANEL
Anion gap: 0 — ABNORMAL LOW (ref 5–15)
BUN: 17 mg/dL (ref 6–20)
CO2: 28 mmol/L (ref 22–32)
Calcium: 9 mg/dL (ref 8.9–10.3)
Chloride: 108 mmol/L (ref 98–111)
Creatinine, Ser: 1.92 mg/dL — ABNORMAL HIGH (ref 0.61–1.24)
GFR, Estimated: 40 mL/min — ABNORMAL LOW (ref >60)
Glucose, Bld: 110 mg/dL — ABNORMAL HIGH (ref 70–99)
Potassium: 3.9 mmol/L (ref 3.5–5.1)
Sodium: 136 mmol/L (ref 135–145)

## 2022-06-12 LAB — CBC
HCT: 36.3 % — ABNORMAL LOW (ref 39.0–52.0)
Hemoglobin: 11.9 g/dL — ABNORMAL LOW (ref 13.0–17.0)
MCH: 30.2 pg (ref 26.0–34.0)
MCHC: 32.8 g/dL (ref 30.0–36.0)
MCV: 92.1 fL (ref 80.0–100.0)
Platelets: 295 10*3/uL (ref 150–400)
RBC: 3.94 MIL/uL — ABNORMAL LOW (ref 4.22–5.81)
RDW: 14.8 % (ref 11.5–15.5)
WBC: 8.3 10*3/uL (ref 4.0–10.5)
nRBC: 0 % (ref 0.0–0.2)

## 2022-06-12 LAB — GLUCOSE, CAPILLARY
Glucose-Capillary: 118 mg/dL — ABNORMAL HIGH (ref 70–99)
Glucose-Capillary: 135 mg/dL — ABNORMAL HIGH (ref 70–99)
Glucose-Capillary: 104 mg/dL — ABNORMAL HIGH (ref 70–99)
Glucose-Capillary: 95 mg/dL (ref 70–99)

## 2022-06-12 MED ORDER — METOPROLOL SUCCINATE ER 25 MG PO TB24: 25.0000 mg | ORAL_TABLET | Freq: Every morning | ORAL | Status: DC

## 2022-06-12 MED ORDER — METOPROLOL SUCCINATE ER 25 MG PO TB24
12.5000 mg | ORAL_TABLET | Freq: Every morning | ORAL | Status: DC
  Administered 2022-06-12 – 2022-06-13 (×2): 12.5 mg via ORAL
  Filled 2022-06-12 (×2): qty 1

## 2022-06-12 MED ORDER — AMLODIPINE BESYLATE 5 MG PO TABS
5.0000 mg | ORAL_TABLET | Freq: Every day | ORAL | Status: DC
  Administered 2022-06-12 – 2022-06-13 (×2): 5 mg via ORAL
  Filled 2022-06-12 (×2): qty 1

## 2022-06-12 NOTE — TOC Benefit Eligibility Note (Signed)
Patient Teacher, English as a foreign language completed.    The patient is currently admitted and upon discharge could be taking Brilinta 90 mg.  The current 30 day co-pay is $0.00.   The patient is insured through [place] [place] Patient Advocate Specialist [place] Patient Advocate Team Direct Number: [phone]  Fax: [phone]

## 2022-06-12 NOTE — Progress Notes (Signed)
CARDIAC REHAB PHASE I   PRE:  Rate/Rhythm: 59 SB    BP: sitting 131/82    SpO2: 96 RA  MODE:  To BR, BP afterward 107/85  MODE:  Ambulation: 330 ft   POST:  Rate/Rhythm: 83 SR    BP: sitting 147/78     SpO2: 96 RA  Pt insistent on going to BR independently. On exiting pt noted to be staggering and c/o dizziness. Had pt sit, BP 107/85. Sts this is his baseline and that he gets dizzy sometimes. Also sts he struggles with neuropathy. He sometimes uses a cane at home.   Ambulated with gait belt, min assist. Still somewhat staggering and dizzy but pt refused to rest. No major LOB but unsafe at times. To recliner, BP now up 147/78. Pt would benefit from PT eval for safety.  Discussed with pt MI, restrictions, diet, smoking cessation, NTG and CRPII. Encouraged pt to walk safely, with his cane. He was distracted and tangential at times but does st he is planning on quitting smoking. Will refer to [place].  [id number]  Yves Dill BS, ACSM-CEP 06/12/2022 11:09 AM

## 2022-06-12 NOTE — Plan of Care (Signed)
  Problem: Education: Goal: Knowledge of General Education information will improve Description: Including pain rating scale, medication(s)/side effects and non-pharmacologic comfort measures Outcome: Progressing   Problem: Health Behavior/Discharge Planning: Goal: Ability to manage health-related needs will improve Outcome: Progressing   Problem: Clinical Measurements: Goal: Ability to maintain clinical measurements within normal limits will improve Outcome: Progressing Goal: Will remain free from infection Outcome: Progressing Goal: Diagnostic test results will improve Outcome: Progressing Goal: Respiratory complications will improve Outcome: Progressing Goal: Cardiovascular complication will be avoided Outcome: Progressing   Problem: Activity: Goal: Risk for activity intolerance will decrease Outcome: Progressing   Problem: Nutrition: Goal: Adequate nutrition will be maintained Outcome: Progressing   Problem: Coping: Goal: Level of anxiety will decrease Outcome: Progressing   Problem: Elimination: Goal: Will not experience complications related to bowel motility Outcome: Progressing Goal: Will not experience complications related to urinary retention Outcome: Progressing   Problem: Pain Managment: Goal: General experience of comfort will improve Outcome: Progressing   Problem: Safety: Goal: Ability to remain free from injury will improve Outcome: Progressing   Problem: Skin Integrity: Goal: Risk for impaired skin integrity will decrease Outcome: Progressing   Problem: Education: Goal: Ability to describe self-care measures that may prevent or decrease complications (Diabetes Survival Skills Education) will improve Outcome: Progressing Goal: Individualized Educational Video(s) Outcome: Progressing   Problem: Coping: Goal: Ability to adjust to condition or change in health will improve Outcome: Progressing   Problem: Fluid Volume: Goal: Ability to  maintain a balanced intake and output will improve Outcome: Progressing   Problem: Health Behavior/Discharge Planning: Goal: Ability to identify and utilize available resources and services will improve Outcome: Progressing Goal: Ability to manage health-related needs will improve Outcome: Progressing   Problem: Metabolic: Goal: Ability to maintain appropriate glucose levels will improve Outcome: Progressing   Problem: Nutritional: Goal: Maintenance of adequate nutrition will improve Outcome: Progressing Goal: Progress toward achieving an optimal weight will improve Outcome: Progressing   Problem: Skin Integrity: Goal: Risk for impaired skin integrity will decrease Outcome: Progressing   Problem: Tissue Perfusion: Goal: Adequacy of tissue perfusion will improve Outcome: Progressing   Problem: Education: Goal: Understanding of cardiac disease, CV risk reduction, and recovery process will improve Outcome: Progressing Goal: Understanding of medication regimen will improve Outcome: Progressing Goal: Individualized Educational Video(s) Outcome: Progressing   Problem: Activity: Goal: Ability to tolerate increased activity will improve Outcome: Progressing   Problem: Cardiac: Goal: Ability to achieve and maintain adequate cardiopulmonary perfusion will improve Outcome: Progressing Goal: Vascular access site(s) Level 0-1 will be maintained Outcome: Progressing   Problem: Health Behavior/Discharge Planning: Goal: Ability to safely manage health-related needs after discharge will improve Outcome: Progressing

## 2022-06-12 NOTE — Progress Notes (Signed)
Rounding Note    Patient Name: Michael [name] Date of Encounter: 06/12/2022  [place] Cardiologist: Carlyle Dolly, MD   Subjective   No recurrent chest pain or SOB.  Inpatient Medications    Scheduled Meds:  amLODipine  10 mg Oral Daily   ARIPiprazole  10 mg Oral Daily   aspirin  81 mg Oral Daily   Chlorhexidine Gluconate Cloth  6 each Topical Daily   enoxaparin (LOVENOX) injection  40 mg Subcutaneous Q24H   FLUoxetine  20 mg Oral q morning   insulin aspart  0-15 Units Subcutaneous TID WC   insulin aspart  0-5 Units Subcutaneous QHS   metoprolol succinate  50 mg Oral q morning   pregabalin  200 mg Oral TID   rosuvastatin  40 mg Oral QHS   sodium chloride flush  3 mL Intravenous Q12H   ticagrelor  90 mg Oral BID   trazodone  300 mg Oral QHS   Continuous Infusions:  sodium chloride     nitroGLYCERIN Stopped (06/11/22 0649)   PRN Meds: sodium chloride, acetaminophen, albuterol, morphine injection, nitroGLYCERIN, ondansetron (ZOFRAN) IV, mouth rinse, pneumococcal 20-valent conjugate vaccine, sodium chloride flush, zolpidem   Vital Signs    Vitals:   06/12/22 0400 06/12/22 0500 06/12/22 0600 06/12/22 0700  BP: 102/69 (!) 93/59 (!) 88/56 (!) 88/53  Pulse: 60 (!) 59 (!) 58 (!) 57  Resp: '13 15 15 '$ (!) 6  Temp:   98.6 F (37 C)   TempSrc:   Oral   SpO2: 94% 91% 92% 95%  Weight:      Height:        Intake/Output Summary (Last 24 hours) at 06/12/2022 0814 Last data filed at 06/11/2022 1900 Gross per 24 hour  Intake 83.3 ml  Output 650 ml  Net -566.7 ml   I/O since admission: +141.3     06/10/2022    9:00 PM 06/10/2022    6:00 PM 06/01/2022    1:30 PM  Last 3 Weights  Weight (lbs) 234 lb 12.6 oz 240 lb 240 lb  Weight (kg) 106.5 kg 108.863 kg 108.863 kg      Telemetry    Sinus rhythm at 58 - 60 - Personally Reviewed  ECG    3/3//2024 ECG (independently read by me):  NSR at 76, small nondiagnostic inferolateral Q waves with mild T wave  abnormality  Physical Exam   BP (!) 88/53   Pulse (!) 57   Temp 98.6 F (37 C) (Oral)   Resp (!) 6   Ht '6\' 2"'$  (1.88 m)   Wt 106.5 kg   SpO2 95%   BMI 30.15 kg/m  General: Alert, oriented, no distress.  Skin: normal turgor, no rashes, warm and dry HEENT: Normocephalic, atraumatic. Pupils equal round and reactive to light; sclera anicteric; extraocular muscles intact; Nose without nasal septal hypertrophy Mouth/Parynx; edentulous ; [place] 4 Neck: No JVD, no carotid bruits; normal carotid upstroke Lungs: clear to ausculatation and percussion; no wheezing or rales Chest wall: without tenderness to palpitation Heart: PMI not displaced, RRR, s1 s2 normal, 1/6 systolic murmur, no diastolic murmur, no rubs, gallops, thrills, or heaves Abdomen: soft, nontender; no hepatosplenomehaly, BS+; abdominal aorta nontender and not dilated by palpation. Back: no CVA tenderness Pulses 2+; R radial site stable Musculoskeletal: full range of motion, normal strength, no joint deformities Extremities: no clubbing cyanosis or edema, Homan's sign negative  Neurologic: grossly nonfocal; Cranial nerves grossly wnl Psychologic: Normal mood and affect   Labs  High Sensitivity Troponin:   Recent Labs  Lab 06/10/22 1803 06/10/22 2043  TROPONINIHS 13 263*     Chemistry Recent Labs  Lab 06/10/22 1803 06/10/22 1829 06/11/22 0147  NA 137 142 137  K 4.9 4.3 3.9  CL 106 109 109  CO2 22  --  20*  GLUCOSE 97 100* 117*  BUN '12 13 12  '$ CREATININE 1.78* 1.80* 1.46*  CALCIUM 9.4  --  8.5*  PROT 7.6  --   --   ALBUMIN 3.8  --   --   AST 23  --   --   ALT 18  --   --   ALKPHOS 86  --   --   BILITOT 0.4  --   --   GFRNONAA 43*  --  55*  ANIONGAP 9  --  8    Lipids  Recent Labs  Lab 06/10/22 1803  CHOL 263*  TRIG 111  HDL 36*  LDLCALC 205*  CHOLHDL 7.3    Hematology Recent Labs  Lab 06/10/22 1803 06/10/22 1829 06/11/22 0147  WBC 9.0  --  10.9*  RBC 4.40  --  4.15*  HGB 13.5  12.9* 12.9*  HCT 41.0 38.0* 37.1*  MCV 93.2  --  89.4  MCH 30.7  --  31.1  MCHC 32.9  --  34.8  RDW 14.8  --  14.7  PLT 338  --  326   Lipid Panel     Component Value Date/Time   CHOL 263 (H) 06/10/2022 1803   TRIG 111 06/10/2022 1803   HDL 36 (L) 06/10/2022 1803   CHOLHDL 7.3 06/10/2022 1803   VLDL 22 06/10/2022 1803   LDLCALC 205 (H) 06/10/2022 1803     Thyroid No results for input(s): "TSH", "FREET4" in the last 168 hours.  BNPNo results for input(s): "BNP", "PROBNP" in the last 168 hours.  DDimer No results for input(s): "DDIMER" in the last 168 hours.   Radiology    ECHOCARDIOGRAM COMPLETE  Result Date: 06/11/2022    ECHOCARDIOGRAM REPORT   Patient Name:   ELIJAN BAULT Date of Exam: 06/11/2022 Medical Rec #:  [id number]    Height:       74.0 in Accession #:    [id number]   Weight:       234.8 lb Date of Birth:  06/07/1960   BSA:          2.327 m Patient Age:    62 years     BP:           142/80 mmHg Patient Gender: M            HR:           61 bpm. Exam Location:  Inpatient Procedure: 2D Echo, Color Doppler and Cardiac Doppler Indications:    acute MI  History:        Patient has prior history of Echocardiogram examinations, most                 recent 03/21/2019. Sarcoidosis. Chronic kidney disease.; Risk                 Factors:Current Smoker and Dyslipidemia.  Sonographer:    Johny Chess RDCS Referring Phys: [place]  1. Left ventricular ejection fraction, by estimation, is 50%. The left ventricle has low normal function. The left ventricle demonstrates regional wall motion abnormalities (see scoring diagram/findings for description). There is mild asymmetric left ventricular hypertrophy of the septal  segment. Left ventricular diastolic parameters were grossly normal.  2. Right ventricular systolic function is normal. The right ventricular size is normal. Tricuspid regurgitation signal is inadequate for assessing PA pressure.  3. The mitral valve is  normal in structure. Trivial mitral valve regurgitation. No evidence of mitral stenosis.  4. The aortic valve is tricuspid. There is mild thickening of the aortic valve. Aortic valve regurgitation is not visualized. No aortic stenosis is present.  5. The inferior vena cava is normal in size with greater than 50% respiratory variability, suggesting right atrial pressure of 3 mmHg. FINDINGS  Left Ventricle: Left ventricular ejection fraction, by estimation, is 50%. The left ventricle has low normal function. The left ventricle demonstrates regional wall motion abnormalities. The left ventricular internal cavity size was normal in size. There is mild asymmetric left ventricular hypertrophy of the septal segment. Left ventricular diastolic parameters were normal.  LV Wall Scoring: The mid and distal lateral wall is akinetic. The mid inferior segment is hypokinetic. Right Ventricle: The right ventricular size is normal. No increase in right ventricular wall thickness. Right ventricular systolic function is normal. Tricuspid regurgitation signal is inadequate for assessing PA pressure. Left Atrium: Left atrial size was normal in size. Right Atrium: Right atrial size was normal in size. Pericardium: There is no evidence of pericardial effusion. Mitral Valve: The mitral valve is normal in structure. Trivial mitral valve regurgitation. No evidence of mitral valve stenosis. Tricuspid Valve: The tricuspid valve is normal in structure. Tricuspid valve regurgitation is trivial. No evidence of tricuspid stenosis. Aortic Valve: The aortic valve is tricuspid. There is mild thickening of the aortic valve. Aortic valve regurgitation is not visualized. No aortic stenosis is present. Pulmonic Valve: The pulmonic valve was normal in structure. Pulmonic valve regurgitation is trivial. No evidence of pulmonic stenosis. Aorta: The aortic root is normal in size and structure. Venous: The inferior vena cava is normal in size with greater  than 50% respiratory variability, suggesting right atrial pressure of 3 mmHg. IAS/Shunts: The atrial septum is grossly normal.  LEFT VENTRICLE PLAX 2D LVIDd:         4.80 cm     Diastology LVIDs:         3.80 cm     LV e' medial:    7.18 cm/s LV PW:         0.90 cm     LV E/e' medial:  8.4 LV IVS:        1.30 cm     LV e' lateral:   12.50 cm/s LVOT diam:     2.00 cm     LV E/e' lateral: 4.8 LV SV:         63 LV SV Index:   27 LVOT Area:     3.14 cm  LV Volumes (MOD) LV vol d, MOD A2C: 70.3 ml LV vol s, MOD A2C: 41.1 ml LV SV MOD A2C:     29.2 ml RIGHT VENTRICLE             IVC RV Basal diam:  2.30 cm     IVC diam: 1.70 cm RV S prime:     11.40 cm/s TAPSE (M-mode): 2.0 cm LEFT ATRIUM             Index        RIGHT ATRIUM           Index LA diam:        3.10 cm 1.33 cm/m   RA Area:  12.10 cm LA Vol (A2C):   53.6 ml 23.04 ml/m  RA Volume:   28.20 ml  12.12 ml/m LA Vol (A4C):   42.6 ml 18.31 ml/m LA Biplane Vol: 48.1 ml 20.67 ml/m  AORTIC VALVE LVOT Vmax:   103.00 cm/s LVOT Vmean:  64.500 cm/s LVOT VTI:    0.201 m  AORTA Ao Root diam: 3.10 cm Ao Asc diam:  3.60 cm MITRAL VALVE MV Area (PHT): 2.87 cm    SHUNTS MV Decel Time: 264 msec    Systemic VTI:  0.20 m MV E velocity: 60.30 cm/s  Systemic Diam: 2.00 cm MV A velocity: 66.10 cm/s MV E/A ratio:  0.91 Cherlynn Kaiser MD Electronically signed by Cherlynn Kaiser MD Signature Date/Time: 06/11/2022/4:13:54 PM    Final    CARDIAC CATHETERIZATION  Result Date: 06/10/2022 Conclusions: Severe single vessel coronary artery disease with occlusion of mid rPDA.  Appearance is similar to distal LAD and LCx occlusions seen in 2020 and may reflect recurrent coronary embolism.  Mild RCA stenosis is similar to prior catheterization in 2021. Mildly elevated left ventricular filling pressure (LVEDP 20 mmHg). Attempted PTCA to rPDA occlusion; flow could not be reestablished despite multiple inflations with a 2.0 x 12 mm balloon. Recommendations: Tirofiban infusion for 18 hours.  Titrate nitroglycerin infusion for relief of chest pain. Dual antiplatelet therapy with aspirin and ticagrelor for at least 12 months. Obtain echocardiogram. Consider hypercoagulable workup and TEE/ILR to assess for potential causes of recurrent coronary occlusions suspicious for emboli. Aggressive secondary prevention of coronary artery disease. Nelva Bush, MD [facility]   Cardiac Studies   CATH/PCI: 06/10/2022 Conclusions: Severe single vessel coronary artery disease with occlusion of mid rPDA.  Appearance is similar to distal LAD and LCx occlusions seen in 2020 and may reflect recurrent coronary embolism.  Mild RCA stenosis is similar to prior catheterization in 2021. Mildly elevated left ventricular filling pressure (LVEDP 20 mmHg). Attempted PTCA to rPDA occlusion; flow could not be reestablished despite multiple inflations with a 2.0 x 12 mm balloon.   Recommendations: Tirofiban infusion for 18 hours. Titrate nitroglycerin infusion for relief of chest pain. Dual antiplatelet therapy with aspirin and ticagrelor for at least 12 months. Obtain echocardiogram. Consider hypercoagulable workup and TEE/ILR to assess for potential causes of recurrent coronary occlusions suspicious for emboli. Aggressive secondary prevention of coronary artery disease.    ECHO: 06/10/2022  1. Left ventricular ejection fraction, by estimation, is 50%. The left  ventricle has low normal function. The left ventricle demonstrates  regional wall motion abnormalities (see scoring diagram/findings for  description). There is mild asymmetric left  ventricular hypertrophy of the septal segment. Left ventricular diastolic  parameters were grossly normal.   2. Right ventricular systolic function is normal. The right ventricular  size is normal. Tricuspid regurgitation signal is inadequate for assessing  PA pressure.   3. The mitral valve is normal in structure. Trivial mitral valve  regurgitation. No evidence  of mitral stenosis.   4. The aortic valve is tricuspid. There is mild thickening of the aortic  valve. Aortic valve regurgitation is not visualized. No aortic stenosis is  present.   5. The inferior vena cava is normal in size with greater than 50%  respiratory variability, suggesting right atrial pressure of 3 mmHg.   FINDINGS   Left Ventricle: Left ventricular ejection fraction, by estimation, is  50%. The left ventricle has low normal function. The left ventricle  demonstrates regional wall motion abnormalities. The left ventricular  internal cavity  size was normal in size.  There is mild asymmetric left ventricular hypertrophy of the septal  segment. Left ventricular diastolic parameters were normal.     LV Wall Scoring:  The mid and distal lateral wall is akinetic. The mid inferior segment is  hypokinetic.   Right Ventricle: The right ventricular size is normal. No increase in  right ventricular wall thickness. Right ventricular systolic function is  normal. Tricuspid regurgitation signal is inadequate for assessing PA  pressure.   Left Atrium: Left atrial size was normal in size.   Right Atrium: Right atrial size was normal in size.   Pericardium: There is no evidence of pericardial effusion.   Mitral Valve: The mitral valve is normal in structure. Trivial mitral  valve regurgitation. No evidence of mitral valve stenosis.   Tricuspid Valve: The tricuspid valve is normal in structure. Tricuspid  valve regurgitation is trivial. No evidence of tricuspid stenosis.   Aortic Valve: The aortic valve is tricuspid. There is mild thickening of  the aortic valve. Aortic valve regurgitation is not visualized. No aortic  stenosis is present.   Pulmonic Valve: The pulmonic valve was normal in structure. Pulmonic valve  regurgitation is trivial. No evidence of pulmonic stenosis.   Aorta: The aortic root is normal in size and structure.   Venous: The inferior vena cava is normal  in size with greater than 50%  respiratory variability, suggesting right atrial pressure of 3 mmHg.   IAS/Shunts: The atrial septum is grossly normal.     LEFT VENTRICLE  PLAX 2D  LVIDd:         4.80 cm     Diastology  LVIDs:         3.80 cm     LV e' medial:    7.18 cm/s  LV PW:         0.90 cm     LV E/e' medial:  8.4  LV IVS:        1.30 cm     LV e' lateral:   12.50 cm/s  LVOT diam:     2.00 cm     LV E/e' lateral: 4.8  LV SV:         63  LV SV Index:   27  LVOT Area:     3.14 cm    LV Volumes (MOD)  LV vol d, MOD A2C: 70.3 ml  LV vol s, MOD A2C: 41.1 ml  LV SV MOD A2C:     29.2 ml   RIGHT VENTRICLE             IVC  RV Basal diam:  2.30 cm     IVC diam: 1.70 cm  RV S prime:     11.40 cm/s  TAPSE (M-mode): 2.0 cm   LEFT ATRIUM             Index        RIGHT ATRIUM           Index  LA diam:        3.10 cm 1.33 cm/m   RA Area:     12.10 cm  LA Vol (A2C):   53.6 ml 23.04 ml/m  RA Volume:   28.20 ml  12.12 ml/m  LA Vol (A4C):   42.6 ml 18.31 ml/m  LA Biplane Vol: 48.1 ml 20.67 ml/m   AORTIC VALVE  LVOT Vmax:   103.00 cm/s  LVOT Vmean:  64.500 cm/s  LVOT VTI:    0.201  m    AORTA  Ao Root diam: 3.10 cm  Ao Asc diam:  3.60 cm   MITRAL VALVE  MV Area (PHT): 2.87 cm    SHUNTS  MV Decel Time: 264 msec    Systemic VTI:  0.20 m  MV E velocity: 60.30 cm/s  Systemic Diam: 2.00 cm  MV A velocity: 66.10 cm/s  MV E/A ratio:  0.91    Patient Profile     Mr Marks Langolf is a 62 y.o. male with a history of CAD with inferolateral STEMI in 03/2019 found to have occlusions of the distal LAD and distal LCX suspicious for coronary emboli, multisystem sarcoidosis (pulmonary and hepatic), hypertension, hyperlipidemia, CKD stage III, and chronic back pain who was admitted on 06/10/2022 for acute STEMI after presenting with chest pain.   Assessment & Plan    Acute inferior STEMI/CAD: Patient feels well, no recurrent chest pain.  Status post severe STEMI secondary to mid PDA occlusion  with presentation on June 10, 2022.  Attempted  PTCA unsuccessful despite multiple inflations.  Plan medical therapy.  He completed tirofiban for 18 hours.  He is off IV nitroglycerin.  Continue DAPT for minimum of 12 months.  It is felt that the patient had an embolic inferior MI with plan for hypercoagulable workup. Hypercoagulable panel was sent.   Per Dr. Margaretann Loveless consider TEE. Ischemic cardiomyopathy: EF in 2021 was 25 to 35%.  On echo yesterday is 50%.  Akinesis of the distal lateral wall and hypokinesis inferiorly. History of hypertension: Currently on metoprolol XL 50 mg and amlodipine 5 mg started yesterday> BP today is 88 - 120.   Lisinopril on hold due to renal function. Will continue at amlodipine 5 mg and give metoprolol succinate 25 mg today. Hyperlipidemia: LDL 205 will initiate rosuvastatin 40 mg, and probably need zetia 10 mg addition; If cannot reach target < 55, then PCSK9 inhibition. CKD: Cr improved from 1.8 > 1.46.  Type 2 DM: HbA1c 6.3 last month ; currently in process Tobacco abuse: cessation is imperative.  Will transfer to 6 E today  For questions or updates, please contact [place] Please consult [email or web address] for contact info under        Signed, Shelva Majestic, MD  06/12/2022, 8:14 AM

## 2022-06-13 ENCOUNTER — Other Ambulatory Visit (HOSPITAL_COMMUNITY): Payer: Self-pay

## 2022-06-13 ENCOUNTER — Encounter: Payer: Self-pay | Admitting: *Deleted

## 2022-06-13 DIAGNOSIS — I255 Ischemic cardiomyopathy: Secondary | ICD-10-CM | POA: Diagnosis not present

## 2022-06-13 DIAGNOSIS — E7841 Elevated Lipoprotein(a): Secondary | ICD-10-CM

## 2022-06-13 DIAGNOSIS — Z006 Encounter for examination for normal comparison and control in clinical research program: Secondary | ICD-10-CM

## 2022-06-13 DIAGNOSIS — E118 Type 2 diabetes mellitus with unspecified complications: Secondary | ICD-10-CM | POA: Insufficient documentation

## 2022-06-13 DIAGNOSIS — N183 Chronic kidney disease, stage 3 unspecified: Secondary | ICD-10-CM | POA: Insufficient documentation

## 2022-06-13 DIAGNOSIS — I429 Cardiomyopathy, unspecified: Secondary | ICD-10-CM

## 2022-06-13 DIAGNOSIS — I2111 ST elevation (STEMI) myocardial infarction involving right coronary artery: Secondary | ICD-10-CM | POA: Diagnosis not present

## 2022-06-13 DIAGNOSIS — N1831 Chronic kidney disease, stage 3a: Secondary | ICD-10-CM | POA: Diagnosis not present

## 2022-06-13 LAB — LUPUS ANTICOAGULANT PANEL
DRVVT: 51 s — ABNORMAL HIGH (ref 0.0–47.0)
PTT Lupus Anticoagulant: 37 s (ref 0.0–43.5)

## 2022-06-13 LAB — BASIC METABOLIC PANEL
Anion gap: 8 (ref 5–15)
BUN: 19 mg/dL (ref 6–20)
CO2: 20 mmol/L — ABNORMAL LOW (ref 22–32)
Calcium: 9.2 mg/dL (ref 8.9–10.3)
Chloride: 110 mmol/L (ref 98–111)
Creatinine, Ser: 1.64 mg/dL — ABNORMAL HIGH (ref 0.61–1.24)
GFR, Estimated: 48 mL/min — ABNORMAL LOW (ref >60)
Glucose, Bld: 103 mg/dL — ABNORMAL HIGH (ref 70–99)
Potassium: 4 mmol/L (ref 3.5–5.1)
Sodium: 138 mmol/L (ref 135–145)

## 2022-06-13 LAB — DRVVT MIX: dRVVT Mix: 43 s — ABNORMAL HIGH (ref 0.0–40.4)

## 2022-06-13 LAB — PROTEIN C ACTIVITY: Protein C Activity: 122 % (ref 73–180)

## 2022-06-13 LAB — GLUCOSE, CAPILLARY
Glucose-Capillary: 81 mg/dL (ref 70–99)
Glucose-Capillary: 118 mg/dL — ABNORMAL HIGH (ref 70–99)

## 2022-06-13 LAB — PROTEIN S, TOTAL: Protein S Ag, Total: 91 % (ref 60–150)

## 2022-06-13 LAB — LIPOPROTEIN A (LPA): Lipoprotein (a): 233.5 nmol/L — ABNORMAL HIGH (ref <75.0)

## 2022-06-13 LAB — DRVVT CONFIRM: dRVVT Confirm: 1.2 ratio (ref 0.8–1.2)

## 2022-06-13 LAB — PROTEIN S ACTIVITY: Protein S Activity: 84 % (ref 63–140)

## 2022-06-13 MED ORDER — METOPROLOL SUCCINATE ER 25 MG PO TB24
12.5000 mg | ORAL_TABLET | Freq: Every morning | ORAL | 11 refills | Status: DC
  Filled 2022-06-13: qty 15, 30d supply, fill #0

## 2022-06-13 MED ORDER — NITROGLYCERIN 0.4 MG SL SUBL
0.4000 mg | SUBLINGUAL_TABLET | SUBLINGUAL | 1 refills | Status: AC | PRN
  Filled 2022-06-13: qty 25, 5d supply, fill #0

## 2022-06-13 MED ORDER — STUDY - EVOLVE-MI - EVOLOCUMAB (REPATHA) 140 MG/ML SQ INJECTION (PI-STUCKEY)
140.0000 mg | INJECTION | SUBCUTANEOUS | Status: DC
  Administered 2022-06-13: 140 mg via SUBCUTANEOUS
  Filled 2022-06-13: qty 1

## 2022-06-13 MED ORDER — ASPIRIN 81 MG PO TBEC
81.0000 mg | DELAYED_RELEASE_TABLET | Freq: Every day | ORAL | 3 refills | Status: AC
  Filled 2022-06-13: qty 90, 90d supply, fill #0

## 2022-06-13 MED ORDER — EZETIMIBE 10 MG PO TABS
10.0000 mg | ORAL_TABLET | Freq: Every day | ORAL | 0 refills | Status: DC
  Filled 2022-06-13: qty 90, 90d supply, fill #0

## 2022-06-13 NOTE — Discharge Summary (Signed)
Discharge Summary    Patient ID: Michael [name] MRN: [id number]; DOB: 14-Jan-1961  Admit date: 06/10/2022 Discharge date: 06/13/2022  PCP:  Michael [name], [place] Providers Cardiologist:  Michael Dolly, MD     Discharge Diagnoses    Principal Problem:   STEMI involving right coronary artery [facility]) Active Problems:   TOBACCO ABUSE   HLD (hyperlipidemia)   Type 2 diabetes mellitus with complication, without long-term current use of insulin ([place])   CKD (chronic kidney disease), stage III ([place])   Cardiomyopathy ([place])  Diagnostic Studies/Procedures    Cath: 3/2/20204   Conclusions: Severe single vessel coronary artery disease with occlusion of mid rPDA.  Appearance is similar to distal LAD and LCx occlusions seen in 2020 and may reflect recurrent coronary embolism.  Mild RCA stenosis is similar to prior catheterization in 2021. Mildly elevated left ventricular filling pressure (LVEDP 20 mmHg). Attempted PTCA to rPDA occlusion; flow could not be reestablished despite multiple inflations with a 2.0 x 12 mm balloon.   Recommendations: Tirofiban infusion for 18 hours. Titrate nitroglycerin infusion for relief of chest pain. Dual antiplatelet therapy with aspirin and ticagrelor for at least 12 months. Obtain echocardiogram. Consider hypercoagulable workup and TEE/ILR to assess for potential causes of recurrent coronary occlusions suspicious for emboli. Aggressive secondary prevention of coronary artery disease.   Michael Bush, MD [facility]   Echo: 06/11/2022   IMPRESSIONS     1. Left ventricular ejection fraction, by estimation, is 50%. The left  ventricle has low normal function. The left ventricle demonstrates  regional wall motion abnormalities (see scoring diagram/findings for  description). There is mild asymmetric left  ventricular hypertrophy of the septal segment. Left ventricular diastolic  parameters were grossly normal.   2. Right  ventricular systolic function is normal. The right ventricular  size is normal. Tricuspid regurgitation signal is inadequate for assessing  PA pressure.   3. The mitral valve is normal in structure. Trivial mitral valve  regurgitation. No evidence of mitral stenosis.   4. The aortic valve is tricuspid. There is mild thickening of the aortic  valve. Aortic valve regurgitation is not visualized. No aortic stenosis is  present.   5. The inferior vena cava is normal in size with greater than 50%  respiratory variability, suggesting right atrial pressure of 3 mmHg.   FINDINGS   Left Ventricle: Left ventricular ejection fraction, by estimation, is  50%. The left ventricle has low normal function. The left ventricle  demonstrates regional wall motion abnormalities. The left ventricular  internal cavity size was normal in size.  There is mild asymmetric left ventricular hypertrophy of the septal  segment. Left ventricular diastolic parameters were normal.     LV Wall Scoring:  The mid and distal lateral wall is akinetic. The mid inferior segment is  hypokinetic.   Right Ventricle: The right ventricular size is normal. No increase in  right ventricular wall thickness. Right ventricular systolic function is  normal. Tricuspid regurgitation signal is inadequate for assessing PA  pressure.   Left Atrium: Left atrial size was normal in size.   Right Atrium: Right atrial size was normal in size.   Pericardium: There is no evidence of pericardial effusion.   Mitral Valve: The mitral valve is normal in structure. Trivial mitral  valve regurgitation. No evidence of mitral valve stenosis.   Tricuspid Valve: The tricuspid valve is normal in structure. Tricuspid  valve regurgitation is trivial. No evidence of tricuspid stenosis.  Aortic Valve: The aortic valve is tricuspid. There is mild thickening of  the aortic valve. Aortic valve regurgitation is not visualized. No aortic  stenosis is  present.   Pulmonic Valve: The pulmonic valve was normal in structure. Pulmonic valve  regurgitation is trivial. No evidence of pulmonic stenosis.   Aorta: The aortic root is normal in size and structure.   Venous: The inferior vena cava is normal in size with greater than 50%  respiratory variability, suggesting right atrial pressure of 3 mmHg.   IAS/Shunts: The atrial septum is grossly normal.   _____________   History of Present Illness     Michael [name] is a 62 y.o. male with history of coronary artery disease with inferolateral STEMI in 03/2019 found to have occlusions of the distal LAD and distal LCx suspicious for coronary emboli, hypertension, hyperlipidemia, sarcoidosis, and chronic kidney disease, who is being seen 06/10/2022 for the evaluation of chest pain and abnormal EKG.   Michael [name] was in his usual state of health until approximately 4:30 PM when he had sudden onset of chest pain rating to the left arm accompanied by diaphoresis.  Symptoms were similar to what he experienced at the time of his STEMI in 03/2019.  He summoned EMS and was found to have inferior ST elevation, prompting activation of the STEMI team.  On arrival at [facility], he continues to have 7/10 chest pain rating to the left shoulder and arm.  He reports being compliant with his medications, including clopidogrel.  He took some nitroglycerin prior to calling EMS and noticed transient improvement in his chest pain, though it has recurred just.   He was taken emergently for cardiac cath.   Hospital Course     Inferior STEMI -- Underwent cardiac catheterization noted above with mid PDA occlusion with attempted PTCA unsuccessful spite multiple attempts.  He was treated with Aggrastat for 18 hours.  Weaned off IV nitroglycerin.  Placed on DAPT with aspirin/Brilinta with recommendations for at least 1 year.  It was felt that the patient had an embolic event and would benefit from a hypercoagulable workup.  Studies  are pending.  Could consider TEE bases on labs, but this can be done as an outpatient.  -- Continue aspirin, Brilinta, Crestor, metoprolol   Hx of ICM/improved -- Previous echo in 2021 showed LVEF of 25 to 30%.  Echo this admission with LVEF of 50% with akinesis of the distal lateral wall and hypokinesis of the inferior wall. -- Continue metoprolol, unable to add ACE, ARB, spiro with renal function   Hypertension -- Continue metoprolol 12.5 mg daily, amlodipine 5 mg daily   Hyperlipidemia -- LDL 205, LDL 36, LP(a) 233.5 -- Continue Crestor 40 mg daily, Zetia  -- Refer to lipid clinic   CKD stage III -- Cr 1.78>>1.80>>1.46>>1.92>>1.64 -- recheck BMET today   Diabetes -- Hgb A1c 6.1 -- continue SSI    Tobacco use -- cessation advised    Did the patient have an acute coronary syndrome (MI, NSTEMI, STEMI, etc) this admission?:  Yes                               AHA/ACC Clinical Performance & Quality Measures: Aspirin prescribed? - Yes ADP Receptor Inhibitor (Plavix/Clopidogrel, Brilinta/Ticagrelor or Effient/Prasugrel) prescribed (includes medically managed patients)? - Yes Beta Blocker prescribed? - Yes High Intensity Statin (Lipitor 40-'80mg'$  or Crestor 20-'40mg'$ ) prescribed? - Yes EF assessed during THIS hospitalization? -  Yes For EF <40%, was ACEI/ARB prescribed? - Not Applicable (EF >/= [id number]) For EF <40%, Aldosterone Antagonist (Spironolactone or Eplerenone) prescribed? - Not Applicable (EF >/= [id number]) Cardiac Rehab Phase II ordered (including medically managed patients)? - Yes       The patient will be scheduled for a TOC follow up appointment in 10-14 days.  A message has been sent to the [facility] and Scheduling Pool at the office where the patient should be seen for follow up.  _____________  Discharge Vitals Blood pressure 108/86, pulse 80, temperature 98.2 F (36.8 C), temperature source Oral, resp. rate 18, height '6\' 2"'$  (1.88 m), weight 106.5 kg, SpO2 98 %.  Filed  Weights   06/10/22 1800 06/10/22 2100  Weight: 108.9 kg 106.5 kg    Labs & Radiologic Studies    CBC Recent Labs    06/10/22 1803 06/10/22 1829 06/11/22 0147 06/12/22 0914  WBC 9.0  --  10.9* 8.3  NEUTROABS 4.7  --   --   --   HGB 13.5   < > 12.9* 11.9*  HCT 41.0   < > 37.1* 36.3*  MCV 93.2  --  89.4 92.1  PLT 338  --  326 295   < > = values in this interval not displayed.   Basic Metabolic Panel Recent Labs    06/12/22 0914 06/13/22 0917  NA 136 138  K 3.9 4.0  CL 108 110  CO2 28 20*  GLUCOSE 110* 103*  BUN 17 19  CREATININE 1.92* 1.64*  CALCIUM 9.0 9.2   Liver Function Tests Recent Labs    06/10/22 1803  AST 23  ALT 18  ALKPHOS 86  BILITOT 0.4  PROT 7.6  ALBUMIN 3.8   No results for input(s): "LIPASE", "AMYLASE" in the last 72 hours. High Sensitivity Troponin:   Recent Labs  Lab 06/10/22 1803 06/10/22 2043  TROPONINIHS 13 263*    BNP Invalid input(s): "POCBNP" D-Dimer No results for input(s): "DDIMER" in the last 72 hours. Hemoglobin A1C Recent Labs    06/10/22 1803  HGBA1C 6.1*   Fasting Lipid Panel Recent Labs    06/10/22 1803  CHOL 263*  HDL 36*  LDLCALC 205*  TRIG 111  CHOLHDL 7.3   Thyroid Function Tests No results for input(s): "TSH", "T4TOTAL", "T3FREE", "THYROIDAB" in the last 72 hours.  Invalid input(s): "FREET3" _____________  DG Chest 2 View  Result Date: 06/12/2022 CLINICAL DATA:  Shortness of breath EXAM: CHEST - 2 VIEW COMPARISON:  04/12/2022 FINDINGS: The heart size and mediastinal contours are within normal limits. Both lungs are clear. The visualized skeletal structures are unremarkable. IMPRESSION: No active cardiopulmonary disease. Electronically Signed   By: Davina Poke D.O.   On: 06/12/2022 16:06   ECHOCARDIOGRAM COMPLETE  Result Date: 06/11/2022    ECHOCARDIOGRAM REPORT   Patient Name:   Michael [name] Date of Exam: 06/11/2022 Medical Rec #:  [id number]    Height:       74.0 in Accession #:    [id number]    Weight:       234.8 lb Date of Birth:  04-16-1960   BSA:          2.327 m Patient Age:    58 years     BP:           142/80 mmHg Patient Gender: M            HR:           61  bpm. Exam Location:  Inpatient Procedure: 2D Echo, Color Doppler and Cardiac Doppler Indications:    acute MI  History:        Patient has prior history of Echocardiogram examinations, most                 recent 03/21/2019. Sarcoidosis. Chronic kidney disease.; Risk                 Factors:Current Smoker and Dyslipidemia.  Sonographer:    Johny Chess RDCS Referring Phys: [place]  1. Left ventricular ejection fraction, by estimation, is 50%. The left ventricle has low normal function. The left ventricle demonstrates regional wall motion abnormalities (see scoring diagram/findings for description). There is mild asymmetric left ventricular hypertrophy of the septal segment. Left ventricular diastolic parameters were grossly normal.  2. Right ventricular systolic function is normal. The right ventricular size is normal. Tricuspid regurgitation signal is inadequate for assessing PA pressure.  3. The mitral valve is normal in structure. Trivial mitral valve regurgitation. No evidence of mitral stenosis.  4. The aortic valve is tricuspid. There is mild thickening of the aortic valve. Aortic valve regurgitation is not visualized. No aortic stenosis is present.  5. The inferior vena cava is normal in size with greater than 50% respiratory variability, suggesting right atrial pressure of 3 mmHg. FINDINGS  Left Ventricle: Left ventricular ejection fraction, by estimation, is 50%. The left ventricle has low normal function. The left ventricle demonstrates regional wall motion abnormalities. The left ventricular internal cavity size was normal in size. There is mild asymmetric left ventricular hypertrophy of the septal segment. Left ventricular diastolic parameters were normal.  LV Wall Scoring: The mid and distal lateral  wall is akinetic. The mid inferior segment is hypokinetic. Right Ventricle: The right ventricular size is normal. No increase in right ventricular wall thickness. Right ventricular systolic function is normal. Tricuspid regurgitation signal is inadequate for assessing PA pressure. Left Atrium: Left atrial size was normal in size. Right Atrium: Right atrial size was normal in size. Pericardium: There is no evidence of pericardial effusion. Mitral Valve: The mitral valve is normal in structure. Trivial mitral valve regurgitation. No evidence of mitral valve stenosis. Tricuspid Valve: The tricuspid valve is normal in structure. Tricuspid valve regurgitation is trivial. No evidence of tricuspid stenosis. Aortic Valve: The aortic valve is tricuspid. There is mild thickening of the aortic valve. Aortic valve regurgitation is not visualized. No aortic stenosis is present. Pulmonic Valve: The pulmonic valve was normal in structure. Pulmonic valve regurgitation is trivial. No evidence of pulmonic stenosis. Aorta: The aortic root is normal in size and structure. Venous: The inferior vena cava is normal in size with greater than 50% respiratory variability, suggesting right atrial pressure of 3 mmHg. IAS/Shunts: The atrial septum is grossly normal.  LEFT VENTRICLE PLAX 2D LVIDd:         4.80 cm     Diastology LVIDs:         3.80 cm     LV e' medial:    7.18 cm/s LV PW:         0.90 cm     LV E/e' medial:  8.4 LV IVS:        1.30 cm     LV e' lateral:   12.50 cm/s LVOT diam:     2.00 cm     LV E/e' lateral: 4.8 LV SV:         63 LV SV Index:  27 LVOT Area:     3.14 cm  LV Volumes (MOD) LV vol d, MOD A2C: 70.3 ml LV vol s, MOD A2C: 41.1 ml LV SV MOD A2C:     29.2 ml RIGHT VENTRICLE             IVC RV Basal diam:  2.30 cm     IVC diam: 1.70 cm RV S prime:     11.40 cm/s TAPSE (M-mode): 2.0 cm LEFT ATRIUM             Index        RIGHT ATRIUM           Index LA diam:        3.10 cm 1.33 cm/m   RA Area:     12.10 cm LA Vol  (A2C):   53.6 ml 23.04 ml/m  RA Volume:   28.20 ml  12.12 ml/m LA Vol (A4C):   42.6 ml 18.31 ml/m LA Biplane Vol: 48.1 ml 20.67 ml/m  AORTIC VALVE LVOT Vmax:   103.00 cm/s LVOT Vmean:  64.500 cm/s LVOT VTI:    0.201 m  AORTA Ao Root diam: 3.10 cm Ao Asc diam:  3.60 cm MITRAL VALVE MV Area (PHT): 2.87 cm    SHUNTS MV Decel Time: 264 msec    Systemic VTI:  0.20 m MV E velocity: 60.30 cm/s  Systemic Diam: 2.00 cm MV A velocity: 66.10 cm/s MV E/A ratio:  0.91 Cherlynn Kaiser MD Electronically signed by Cherlynn Kaiser MD Signature Date/Time: 06/11/2022/4:13:54 PM    Final    CARDIAC CATHETERIZATION  Result Date: 06/10/2022 Conclusions: Severe single vessel coronary artery disease with occlusion of mid rPDA.  Appearance is similar to distal LAD and LCx occlusions seen in 2020 and may reflect recurrent coronary embolism.  Mild RCA stenosis is similar to prior catheterization in 2021. Mildly elevated left ventricular filling pressure (LVEDP 20 mmHg). Attempted PTCA to rPDA occlusion; flow could not be reestablished despite multiple inflations with a 2.0 x 12 mm balloon. Recommendations: Tirofiban infusion for 18 hours. Titrate nitroglycerin infusion for relief of chest pain. Dual antiplatelet therapy with aspirin and ticagrelor for at least 12 months. Obtain echocardiogram. Consider hypercoagulable workup and TEE/ILR to assess for potential causes of recurrent coronary occlusions suspicious for emboli. Aggressive secondary prevention of coronary artery disease. Michael Bush, MD [facility]  Disposition   Pt is being discharged home today in good condition.  Follow-up Plans & Appointments     Follow-up Information     Branch, Alphonse Guild, MD Follow up.   Specialty: Cardiology Why: You will see Dr. Percell Locus' NP Sherri at 3:30pm for your follow up appt Contact information: [address] [place] [address] [phone]                Discharge Instructions     Amb Referral to Cardiac  Rehabilitation   Complete by: As directed    Diagnosis: STEMI   After initial evaluation and assessments completed: Virtual Based Care may be provided alone or in conjunction with Phase 2 Cardiac Rehab based on patient barriers.: Yes   Intensive Cardiac Rehabilitation (ICR) [place] location only OR Traditional Cardiac Rehabilitation (TCR) *If criteria for ICR are not met will enroll in TCR [facility] only): Yes        Discharge Medications   Allergies as of 06/13/2022       Reactions   Methadone Other (See Comments)   REACTION: "Become mentally unstable"  Medication List     STOP taking these medications    hydrochlorothiazide 12.5 MG tablet Commonly known as: HYDRODIURIL   meclizine 25 MG tablet Commonly known as: ANTIVERT       TAKE these medications    albuterol (2.5 MG/3ML) 0.083% nebulizer solution Commonly known as: PROVENTIL Take 2.5 mg by nebulization every 6 (six) hours as needed.   albuterol 108 (90 Base) MCG/ACT inhaler Commonly known as: VENTOLIN HFA Inhale 2 puffs into the lungs every 6 (six) hours as needed for wheezing or shortness of breath.   amLODipine 5 MG tablet Commonly known as: NORVASC TAKE ONE TABLET BY MOUTH DAILY (MORNING)   ARIPiprazole 10 MG tablet Commonly known as: ABILIFY Take 10 mg by mouth daily.   aspirin EC 81 MG tablet Take 1 tablet (81 mg total) by mouth daily. Swallow whole.   cyclobenzaprine 10 MG tablet Commonly known as: FLEXERIL Take 1 tablet by mouth at bedtime as needed.   ezetimibe 10 MG tablet Commonly known as: Zetia Take 1 tablet (10 mg total) by mouth daily.   FLUoxetine 20 MG capsule Commonly known as: PROZAC Take 20 mg by mouth every morning.   furosemide 20 MG tablet Commonly known as: LASIX Take 20 mg by mouth daily as needed.   methocarbamol 750 MG tablet Commonly known as: ROBAXIN Take 750 mg by mouth every 8 (eight) hours.   metoprolol succinate 25 MG 24 hr tablet Commonly known as:  TOPROL-XL Take 1/2 tablet (12.5 mg total) by mouth every morning. Start taking on: June 14, 2022 What changed: how much to take   Nebulizer Misc machine   nitroGLYCERIN 0.4 MG SL tablet Commonly known as: NITROSTAT Place 1 tablet (0.4 mg total) under the tongue every 5 (five) minutes as needed for chest pain. What changed: See the new instructions.   oxyCODONE 5 MG immediate release tablet Commonly known as: Oxy IR/ROXICODONE Take 10 mg by mouth as needed for severe pain. Takes '10mg'$    pregabalin 200 MG capsule Commonly known as: LYRICA Take 200 mg by mouth in the morning, at noon, and at bedtime.   rosuvastatin 20 MG tablet Commonly known as: CRESTOR Take 40 mg by mouth at bedtime.   ticagrelor 90 MG Tabs tablet Commonly known as: BRILINTA Take by mouth 2 (two) times daily.   trazodone 300 MG tablet Commonly known as: DESYREL Take 300 mg by mouth at bedtime.   Vitamin D (Ergocalciferol) 50000 units Caps Take 1 capsule by mouth once a week.   zolpidem 5 MG tablet Commonly known as: AMBIEN Take 10 mg by mouth at bedtime as needed.           Outstanding Labs/Studies   FLP/LFTs in 8 weeks  BMET at follow up  Duration of Discharge Encounter   Greater than 30 minutes including physician time.  Signed, Reino Bellis, NP 06/13/2022, 1:11 PM

## 2022-06-13 NOTE — Research (Signed)
EVOLVE  Informed Consent   Subject Name: Michael [name]  Subject met inclusion and exclusion criteria.  The informed consent form, study requirements and expectations were reviewed with the subject and questions and concerns were addressed prior to the signing of the consent form.  The subject verbalized understanding of the trial requirements.  The subject agreed to participate in the EVOLVE trial and signed the informed consent at 12:05 on 06-13-2022.  The informed consent was obtained prior to performance of any protocol-specific procedures for the subject.  A copy of the signed informed consent was given to the subject and a copy was placed in the subject's medical record.   [place] Harveen Flesch, Research Coordinator      Protocol # [id number]  Subject Initial ID#:   [id number]                               Age in years: 54  *Demographics are found in the [facility] EMR source.   Protocol Version: v2.00 [id number]  Were all Eligibility Criteria Met?  '[x]'$  Yes  '[]'$   No  Screening Visit Date:                13-Jun-2022        Protocol # [id number]   Subject ID#         [id number]                     DAY 1 Date:       13-Jun-2022  Randomization:   Geographic Region:    [place]  Randomization Date:    13-Jun-2022  Randomization Time:       Treatment type Assigned   '[x]'$  Treatment                                   '[]'$   Control           Protocol # [id number]   Subject ID#  [id number]                             DAY 1 Date:        13-Jun-2022     Initial Study Treatment- Evolocumab Self-administration  Training for Evolocumab Self-Administration Completed?  '[x]'$  YES  '[]'$   NO  Training for Evolocumab Self-Administration Date:   13-Jun-2022  Evolocumab Administered?  '[x]'$  YES  '[]'$   NO  Evolocumab Start Date: 13-Jun-2022  Lot Number of Initial Dose Administered: [id number]  Additional Lot Number Distributed to Subject: [id number]  Additional Lot Number Distributed to Subject:  [id number]  Additional Lot Number Distributed to Subject: [id number]  Additional Lot Number Distributed to Subject: [id number]  Additional Lot Number Distributed to Subject: - [id number]

## 2022-06-13 NOTE — TOC Initial Note (Signed)
Transition of Care [facility]) - Initial/Assessment Note    Patient Details  Name: Michael [name] MRN: [id number] Date of Birth: 1961/01/15  Transition of Care [facility]) CM/SW Contact:    Bethena Roys, RN Phone Number: 06/13/2022, 4:38 PM  Clinical Narrative: Patient presented for chest pain. PTA patient states he was from home alone. Patient reports that he still drives to appointments and has family support when needed. Patient states his PCP makes house calls and he has DME cane in the home. Case Manager discussed home health services with the patient- patient does not have a preference. Case Manager submitted the referral to [facility] and start of care to begin within 24-48 hours post transition home. No further needs identified. Patient has transportation home.                   Expected Discharge Plan: [place] Barriers to Discharge: No Barriers Identified   Patient Goals and CMS Choice Patient states their goals for this hospitalization and ongoing recovery are:: to return home.   Choice offered to / list presented to :  (patient did not have a preference.)      Expected Discharge Plan and Services In-house Referral: Clinical Social Work Discharge Planning Services: CM Consult   Living arrangements for the past 2 months: Single Family Home Expected Discharge Date: 06/13/22                         HH Arranged: PT [place]: Pablo P[place] Date [place]: 06/13/22 Time HH Agency Contacted: [phone] Representative spoke with at [place]: Tommi Rumps  Prior Living Arrangements/Services Living arrangements for the past 2 months: [place] with:: Self Patient language and need for interpreter reviewed:: Yes Do you feel safe going back to the place where you live?: Yes      Need for Family Participation in Patient Care: No (Comment) Care giver support system in place?: No (comment)   Criminal Activity/Legal Involvement  Pertinent to Current Situation/Hospitalization: No - Comment as needed  Activities of Daily Living Home Assistive Devices/Equipment: Cane (specify quad or straight) ADL Screening (condition at time of admission) Patient's cognitive ability adequate to safely complete daily activities?: Yes Is the patient deaf or have difficulty hearing?: No Does the patient have difficulty seeing, even when wearing glasses/contacts?: Yes Does the patient have difficulty concentrating, remembering, or making decisions?: No Patient able to express need for assistance with ADLs?: Yes Does the patient have difficulty dressing or bathing?: No Independently performs ADLs?: Yes (appropriate for developmental age) Does the patient have difficulty walking or climbing stairs?: No Weakness of Legs: None Weakness of Arms/Hands: None  Permission Sought/Granted Permission sought to share information with : Family Supports, Customer service manager, Case Optician, dispensing granted to share information with : Yes, Verbal Permission Granted     Permission granted to share info w AGENCY: [facility]        Emotional Assessment Appearance:: Appears stated age Attitude/Demeanor/Rapport: Engaged Affect (typically observed): Appropriate Orientation: : Oriented to Situation, Oriented to  Time, Oriented to Self Alcohol / Substance Use: Not Applicable Psych Involvement: No (comment)  Admission diagnosis:  ST elevation myocardial infarction (STEMI), unspecified artery ([place]) [I21.3] STEMI involving right coronary artery ([place]) [I21.11] Patient Active Problem List   Diagnosis Date Noted   Type 2 diabetes mellitus with complication, without long-term current use of insulin ([place]) 06/13/2022   CKD (chronic kidney disease), stage III (  [place]) 06/13/2022   Cardiomyopathy ([place]) 06/13/2022   STEMI involving right coronary artery ([place]) 06/10/2022   Fecal incontinence 08/08/2019   FH: colon cancer in first degree relative <30  years old 08/08/2019   NSTEMI (non-ST elevated myocardial infarction) ([place]) 05/20/2019   HLD (hyperlipidemia) 03/23/2019   S/P PTCA (percutaneous transluminal coronary angioplasty) 03/20/19 to LCX  03/23/2019   STEMI (ST elevation myocardial infarction) ([place]) 03/20/2019   STEMI involving left circumflex coronary artery ([place]) 03/20/2019   S/P shoulder surgery / open distal clavicle excision right shoulder 01/23/19 02/06/2019   AC joint arthropathy    Obesity 08/09/2018   Chronic kidney disease, stage 2 (mild) 06/25/2018   Hypercalcemia 06/25/2018   Abdominal pain 06/11/2017   H/O Spinal surgery 01/24/2017   Pain in left leg 01/24/2017   Radicular pain 01/24/2017   Numbness 11/24/2015   Chronic lumbar radiculopathy 11/24/2015   FULL INCONTINENCE OF FECES 05/09/2010   DIARRHEA 05/09/2010   TOBACCO ABUSE 03/27/2008   ALLERGIC RHINITIS 07/18/2007   INSOMNIA 07/18/2007   Sarcoidosis 05/09/2007   ANXIETY 05/09/2007   DEPRESSION 05/09/2007   GERD 05/09/2007   ARTHRITIS 05/09/2007   LOW BACK PAIN 05/09/2007   MIGRAINES, HX OF 05/09/2007   Personal history of other specified conditions 05/09/2007   PCP:  Leonie Douglas, MD Pharmacy:   [facility], [address] [place] [address] Phone: [phone] Fax: [phone]  [facility] [address] [place] [address] Phone: [phone] Fax: [phone]     Social Determinants of Health (SDOH) Social History: SDOH Screenings   Food Insecurity: No Food Insecurity (06/10/2022)  Housing: Low Risk  (06/10/2022)  Transportation Needs: No Transportation Needs (06/10/2022)  Utilities: Not At Risk (06/10/2022)  Depression (PHQ2-9): Low Risk  (05/09/2018)  Tobacco Use: High Risk (06/12/2022)   SDOH Interventions:     Readmission Risk Interventions     No data to display
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
## 2022-06-13 NOTE — Progress Notes (Signed)
Seen pt to reeducate on restrictions, diet, smoking cessation and Birlinta use.  [id number]

## 2022-06-13 NOTE — Progress Notes (Addendum)
Rounding Note    Patient Name: Michael [name] Date of Encounter: 06/13/2022  [place] Cardiologist: Carlyle Dolly, MD   Subjective   No complaints this morning. Hopeful to go home. Had some shortness of breath related to Brilinta yesterday.   Inpatient Medications    Scheduled Meds:  amLODipine  5 mg Oral Daily   ARIPiprazole  10 mg Oral Daily   aspirin  81 mg Oral Daily   Chlorhexidine Gluconate Cloth  6 each Topical Daily   enoxaparin (LOVENOX) injection  40 mg Subcutaneous Q24H   FLUoxetine  20 mg Oral q morning   insulin aspart  0-15 Units Subcutaneous TID WC   insulin aspart  0-5 Units Subcutaneous QHS   metoprolol succinate  12.5 mg Oral q morning   pregabalin  200 mg Oral TID   rosuvastatin  40 mg Oral QHS   sodium chloride flush  3 mL Intravenous Q12H   ticagrelor  90 mg Oral BID   trazodone  300 mg Oral QHS   Continuous Infusions:  sodium chloride     PRN Meds: sodium chloride, acetaminophen, albuterol, morphine injection, nitroGLYCERIN, ondansetron (ZOFRAN) IV, mouth rinse, pneumococcal 20-valent conjugate vaccine, sodium chloride flush, zolpidem   Vital Signs    Vitals:   06/13/22 0429 06/13/22 0740 06/13/22 0925 06/13/22 1133  BP: 119/68 (!) 114/54 113/67 108/86  Pulse: 72 69 77 80  Resp: '17 18  18  '$ Temp: 98.1 F (36.7 C) 98.3 F (36.8 C)  98.2 F (36.8 C)  TempSrc: Oral Oral  Oral  SpO2: 95% 96%  98%  Weight:      Height:        Intake/Output Summary (Last 24 hours) at 06/13/2022 1229 Last data filed at 06/12/2022 2022 Gross per 24 hour  Intake --  Output 1275 ml  Net -1275 ml      06/10/2022    9:00 PM 06/10/2022    6:00 PM 06/01/2022    1:30 PM  Last 3 Weights  Weight (lbs) 234 lb 12.6 oz 240 lb 240 lb  Weight (kg) 106.5 kg 108.863 kg 108.863 kg      Telemetry    Sinus Bradycardia - Personally Reviewed  ECG    No new tracing this morning  Physical Exam   GEN: No acute distress.   Neck: No JVD Cardiac: RRR, no  murmurs, rubs, or gallops.  Respiratory: Clear to auscultation bilaterally. GI: Soft, nontender, non-distended  MS: No edema; No deformity. Right radial cath site stable.  Neuro:  Nonfocal  Psych: Normal affect   Labs    High Sensitivity Troponin:   Recent Labs  Lab 06/10/22 1803 06/10/22 2043  TROPONINIHS 13 263*     Chemistry Recent Labs  Lab 06/10/22 1803 06/10/22 1829 06/11/22 0147 06/12/22 0914 06/13/22 0917  NA 137   < > 137 136 138  K 4.9   < > 3.9 3.9 4.0  CL 106   < > 109 108 110  CO2 22  --  20* 28 20*  GLUCOSE 97   < > 117* 110* 103*  BUN 12   < > '12 17 19  '$ CREATININE 1.78*   < > 1.46* 1.92* 1.64*  CALCIUM 9.4  --  8.5* 9.0 9.2  PROT 7.6  --   --   --   --   ALBUMIN 3.8  --   --   --   --   AST 23  --   --   --   --  ALT 18  --   --   --   --   ALKPHOS 86  --   --   --   --   BILITOT 0.4  --   --   --   --   GFRNONAA 43*  --  55* 40* 48*  ANIONGAP 9  --  8 0* 8   < > = values in this interval not displayed.    Lipids  Recent Labs  Lab 06/10/22 1803  CHOL 263*  TRIG 111  HDL 36*  LDLCALC 205*  CHOLHDL 7.3    Hematology Recent Labs  Lab 06/10/22 1803 06/10/22 1829 06/11/22 0147 06/12/22 0914  WBC 9.0  --  10.9* 8.3  RBC 4.40  --  4.15* 3.94*  HGB 13.5 12.9* 12.9* 11.9*  HCT 41.0 38.0* 37.1* 36.3*  MCV 93.2  --  89.4 92.1  MCH 30.7  --  31.1 30.2  MCHC 32.9  --  34.8 32.8  RDW 14.8  --  14.7 14.8  PLT 338  --  326 295   Thyroid No results for input(s): "TSH", "FREET4" in the last 168 hours.  BNPNo results for input(s): "BNP", "PROBNP" in the last 168 hours.  DDimer No results for input(s): "DDIMER" in the last 168 hours.   Radiology    DG Chest 2 View  Result Date: 06/12/2022 CLINICAL DATA:  Shortness of breath EXAM: CHEST - 2 VIEW COMPARISON:  04/12/2022 FINDINGS: The heart size and mediastinal contours are within normal limits. Both lungs are clear. The visualized skeletal structures are unremarkable. IMPRESSION: No active  cardiopulmonary disease. Electronically Signed   By: Davina Poke D.O.   On: 06/12/2022 16:06   ECHOCARDIOGRAM COMPLETE  Result Date: 06/11/2022    ECHOCARDIOGRAM REPORT   Patient Name:   Michael [name] Date of Exam: 06/11/2022 Medical Rec #:  [id number]    Height:       74.0 in Accession #:    [id number]   Weight:       234.8 lb Date of Birth:  [birth year]   BSA:          2.327 m Patient Age:    62 years     BP:           142/80 mmHg Patient Gender: M            HR:           61 bpm. Exam Location:  Inpatient Procedure: 2D Echo, Color Doppler and Cardiac Doppler Indications:    acute MI  History:        Patient has prior history of Echocardiogram examinations, most                 recent 03/21/2019. Sarcoidosis. Chronic kidney disease.; Risk                 Factors:Current Smoker and Dyslipidemia.  Sonographer:    Johny Chess RDCS Referring Phys: [place]  1. Left ventricular ejection fraction, by estimation, is 50%. The left ventricle has low normal function. The left ventricle demonstrates regional wall motion abnormalities (see scoring diagram/findings for description). There is mild asymmetric left ventricular hypertrophy of the septal segment. Left ventricular diastolic parameters were grossly normal.  2. Right ventricular systolic function is normal. The right ventricular size is normal. Tricuspid regurgitation signal is inadequate for assessing PA pressure.  3. The mitral valve is normal in structure. Trivial mitral valve regurgitation. No evidence of mitral  stenosis.  4. The aortic valve is tricuspid. There is mild thickening of the aortic valve. Aortic valve regurgitation is not visualized. No aortic stenosis is present.  5. The inferior vena cava is normal in size with greater than 50% respiratory variability, suggesting right atrial pressure of 3 mmHg. FINDINGS  Left Ventricle: Left ventricular ejection fraction, by estimation, is 50%. The left ventricle has low normal  function. The left ventricle demonstrates regional wall motion abnormalities. The left ventricular internal cavity size was normal in size. There is mild asymmetric left ventricular hypertrophy of the septal segment. Left ventricular diastolic parameters were normal.  LV Wall Scoring: The mid and distal lateral wall is akinetic. The mid inferior segment is hypokinetic. Right Ventricle: The right ventricular size is normal. No increase in right ventricular wall thickness. Right ventricular systolic function is normal. Tricuspid regurgitation signal is inadequate for assessing PA pressure. Left Atrium: Left atrial size was normal in size. Right Atrium: Right atrial size was normal in size. Pericardium: There is no evidence of pericardial effusion. Mitral Valve: The mitral valve is normal in structure. Trivial mitral valve regurgitation. No evidence of mitral valve stenosis. Tricuspid Valve: The tricuspid valve is normal in structure. Tricuspid valve regurgitation is trivial. No evidence of tricuspid stenosis. Aortic Valve: The aortic valve is tricuspid. There is mild thickening of the aortic valve. Aortic valve regurgitation is not visualized. No aortic stenosis is present. Pulmonic Valve: The pulmonic valve was normal in structure. Pulmonic valve regurgitation is trivial. No evidence of pulmonic stenosis. Aorta: The aortic root is normal in size and structure. Venous: The inferior vena cava is normal in size with greater than 50% respiratory variability, suggesting right atrial pressure of 3 mmHg. IAS/Shunts: The atrial septum is grossly normal.  LEFT VENTRICLE PLAX 2D LVIDd:         4.80 cm     Diastology LVIDs:         3.80 cm     LV e' medial:    7.18 cm/s LV PW:         0.90 cm     LV E/e' medial:  8.4 LV IVS:        1.30 cm     LV e' lateral:   12.50 cm/s LVOT diam:     2.00 cm     LV E/e' lateral: 4.8 LV SV:         63 LV SV Index:   27 LVOT Area:     3.14 cm  LV Volumes (MOD) LV vol d, MOD A2C: 70.3 ml LV vol  s, MOD A2C: 41.1 ml LV SV MOD A2C:     29.2 ml RIGHT VENTRICLE             IVC RV Basal diam:  2.30 cm     IVC diam: 1.70 cm RV S prime:     11.40 cm/s TAPSE (M-mode): 2.0 cm LEFT ATRIUM             Index        RIGHT ATRIUM           Index LA diam:        3.10 cm 1.33 cm/m   RA Area:     12.10 cm LA Vol (A2C):   53.6 ml 23.04 ml/m  RA Volume:   28.20 ml  12.12 ml/m LA Vol (A4C):   42.6 ml 18.31 ml/m LA Biplane Vol: 48.1 ml 20.67 ml/m  AORTIC VALVE LVOT Vmax:  103.00 cm/s LVOT Vmean:  64.500 cm/s LVOT VTI:    0.201 m  AORTA Ao Root diam: 3.10 cm Ao Asc diam:  3.60 cm MITRAL VALVE MV Area (PHT): 2.87 cm    SHUNTS MV Decel Time: 264 msec    Systemic VTI:  0.20 m MV E velocity: 60.30 cm/s  Systemic Diam: 2.00 cm MV A velocity: 66.10 cm/s MV E/A ratio:  0.91 Cherlynn Kaiser MD Electronically signed by Cherlynn Kaiser MD Signature Date/Time: 06/11/2022/4:13:54 PM    Final     Cardiac Studies   Cath: 3/2/20204  Conclusions: Severe single vessel coronary artery disease with occlusion of mid rPDA.  Appearance is similar to distal LAD and LCx occlusions seen in 2020 and may reflect recurrent coronary embolism.  Mild RCA stenosis is similar to prior catheterization in 2021. Mildly elevated left ventricular filling pressure (LVEDP 20 mmHg). Attempted PTCA to rPDA occlusion; flow could not be reestablished despite multiple inflations with a 2.0 x 12 mm balloon.   Recommendations: Tirofiban infusion for 18 hours. Titrate nitroglycerin infusion for relief of chest pain. Dual antiplatelet therapy with aspirin and ticagrelor for at least 12 months. Obtain echocardiogram. Consider hypercoagulable workup and TEE/ILR to assess for potential causes of recurrent coronary occlusions suspicious for emboli. Aggressive secondary prevention of coronary artery disease.   Nelva Bush, MD [facility]  Echo: 06/11/2022  IMPRESSIONS     1. Left ventricular ejection fraction, by estimation, is 50%. The left   ventricle has low normal function. The left ventricle demonstrates  regional wall motion abnormalities (see scoring diagram/findings for  description). There is mild asymmetric left  ventricular hypertrophy of the septal segment. Left ventricular diastolic  parameters were grossly normal.   2. Right ventricular systolic function is normal. The right ventricular  size is normal. Tricuspid regurgitation signal is inadequate for assessing  PA pressure.   3. The mitral valve is normal in structure. Trivial mitral valve  regurgitation. No evidence of mitral stenosis.   4. The aortic valve is tricuspid. There is mild thickening of the aortic  valve. Aortic valve regurgitation is not visualized. No aortic stenosis is  present.   5. The inferior vena cava is normal in size with greater than 50%  respiratory variability, suggesting right atrial pressure of 3 mmHg.   FINDINGS   Left Ventricle: Left ventricular ejection fraction, by estimation, is  50%. The left ventricle has low normal function. The left ventricle  demonstrates regional wall motion abnormalities. The left ventricular  internal cavity size was normal in size.  There is mild asymmetric left ventricular hypertrophy of the septal  segment. Left ventricular diastolic parameters were normal.     LV Wall Scoring:  The mid and distal lateral wall is akinetic. The mid inferior segment is  hypokinetic.   Right Ventricle: The right ventricular size is normal. No increase in  right ventricular wall thickness. Right ventricular systolic function is  normal. Tricuspid regurgitation signal is inadequate for assessing PA  pressure.   Left Atrium: Left atrial size was normal in size.   Right Atrium: Right atrial size was normal in size.   Pericardium: There is no evidence of pericardial effusion.   Mitral Valve: The mitral valve is normal in structure. Trivial mitral  valve regurgitation. No evidence of mitral valve stenosis.    Tricuspid Valve: The tricuspid valve is normal in structure. Tricuspid  valve regurgitation is trivial. No evidence of tricuspid stenosis.   Aortic Valve: The aortic valve is tricuspid. There  is mild thickening of  the aortic valve. Aortic valve regurgitation is not visualized. No aortic  stenosis is present.   Pulmonic Valve: The pulmonic valve was normal in structure. Pulmonic valve  regurgitation is trivial. No evidence of pulmonic stenosis.   Aorta: The aortic root is normal in size and structure.   Venous: The inferior vena cava is normal in size with greater than 50%  respiratory variability, suggesting right atrial pressure of 3 mmHg.   IAS/Shunts: The atrial septum is grossly normal.       Patient Profile     63 y.o. male with a history of CAD with inferolateral STEMI in 03/2019 found to have occlusions of the distal LAD and distal LCX suspicious for coronary emboli, multisystem sarcoidosis (pulmonary and hepatic), hypertension, hyperlipidemia, CKD stage III, and chronic back pain who was admitted on 06/10/2022 for acute STEMI after presenting with chest pain.    Assessment & Plan    Inferior STEMI -- Underwent cardiac catheterization noted above with mid PDA occlusion with attempted PTCA unsuccessful spite multiple attempts.  He was treated with Aggrastat for 18 hours.  Weaned off IV nitroglycerin.  Placed on DAPT with aspirin/Brilinta with recommendations for at least 1 year.  It was felt that the patient had an embolic event and would benefit from a hypercoagulable workup.  Studies are pending.  Could consider TEE, though suspect this could be done as an outpatient.  Seen by cardiac rehab with recommendations for PT evaluation, will order. -- Continue aspirin, Brilinta, Crestor, metoprolol  Hx of ICM/improved -- Previous echo in 2021 showed LVEF of 25 to 30%.  Echo this admission with LVEF of 50% with akinesis of the distal lateral wall and hypokinesis of the inferior  wall. -- Continue metoprolol, unable to add ACE, ARB, spiro with renal function  Hypertension -- Continue metoprolol 12.5 mg daily, amlodipine 5 mg daily  Hyperlipidemia -- LDL 205, LDL 36, LP(a) 233.5 -- Continue Crestor 40 mg daily -- Refer to lipid clinic  CKD stage III -- Cr 1.78>>1.80>>1.46>>1.92 -- recheck BMET today  Diabetes -- Hgb A1c 6.1 -- continue SSI   Tobacco use -- cessation advised   For questions or updates, please contact [place] Please consult [email or web address] for contact info under        Signed, Shelva Majestic, MD  06/13/2022, 12:29 PM      Patient seen and examined. Agree with assessment and plan. No recurrent chest  pain Cr increased yesterday 1.92 slightly improved today at 1.64.  Significant elevation of LP(a) at 233.5, add zetia 10 mg to Crestor 40 mg and probably initiate PCSK 9  inhibition as outpatient. Hypercoagulable w/u in progress. Patient is wanting to go home today.    Troy Sine, MD, [facility] 06/13/2022 12:29 PM

## 2022-06-13 NOTE — Evaluation (Signed)
Physical Therapy Evaluation Patient Details Name: Michael [name] MRN: [id number] DOB: 24-Feb-1961 Today's Date: 06/13/2022  History of Present Illness  Pt is a 62yo male who was admitted for chest pain. Pt found to have inferior ST elevation and was taken for emergent cardiac cath revealing occlusion of mid RPDA, balloon angioplasty was attempted by not successful. [id number], CKD, MSTEMIx2, bilat THA, laminectomy, L heart cath   Clinical Impression  Pt admitted with above. Pt with noted dizziness vs lightheadedness with standing and ambulation. Improved slightly s/p 10 min of being upright however is at increased falls risk and had to catch self on the wall upon initial standing to maintain balance. Pt with improved stability with RW however pt reports "I don't feel any better with it" despite being very UE dependent and locking arms out in extension. When attempting to amb without AD pt with short shuffled steps and staggering left/right. Pt resistant to all PT recommendations stating "I am 61yo, I know what I need. I take care of myself." Acute PT to cont to follow.       Recommendations for follow up therapy are one component of a multi-disciplinary discharge planning process, led by the attending physician.  Recommendations may be updated based on patient status, additional functional criteria and insurance authorization.  Follow Up Recommendations Home health PT (however pt declined)      Assistance Recommended at Discharge Frequent or constant Supervision/Assistance  Patient can return home with the following  A little help with walking and/or transfers;A little help with bathing/dressing/bathroom;Assist for transportation;Help with stairs or ramp for entrance    Equipment Recommendations Rolling walker (2 wheels) (however pt will most likely decline)  Recommendations for Other Services       Functional Status Assessment Patient has had a recent decline in their functional status  and demonstrates the ability to make significant improvements in function in a reasonable and predictable amount of time.     Precautions / Restrictions Precautions Precautions: Fall Precaution Comments: lightheaded, fall risk Restrictions Weight Bearing Restrictions: No      Mobility  Bed Mobility Overal bed mobility: Independent             General bed mobility comments: HOB flat    Transfers Overall transfer level: Needs assistance Equipment used: None Transfers: Sit to/from Stand Sit to Stand: Min assist           General transfer comment: upon first stand pt very light headed and stumbled and placed hand up on the wall and required minA from PT via gait belt to maintain balance. Attempted to stand with RW however pt refused to listen to PT on pushing up from the bed and not the walker and required modA to get up to full standing due to posterior bias and inability to stand up, pt completed 3rd stand without RW and required minA for stability    Ambulation/Gait Ambulation/Gait assistance: Min assist Gait Distance (Feet): 150 Feet Assistive device: Rolling walker (2 wheels) Gait Pattern/deviations: Step-through pattern, Decreased stride length, Knees buckling Gait velocity: dec Gait velocity interpretation: <1.31 ft/sec, indicative of household ambulator   General Gait Details: pt amb 150' with RW and was very UE dependent with arms locked out in extension. Pt with increased step length and height, unable to amb in straight line, vearing L/R with RW. Pt reports mild SOB and lightheadedness. Attempted to amb without AD, pt unsteady vearing R/L, occassional cross over step to catch balance and with shorter step length and  height, near shuffling x 30'  Stairs            Wheelchair Mobility    Modified Rankin (Stroke Patients Only)       Balance Overall balance assessment: Needs assistance Sitting-balance support: Feet supported, No upper extremity  supported Sitting balance-Leahy Scale: Good Sitting balance - Comments: able to don socks at EOB   Standing balance support: Single extremity supported, During functional activity, Reliant on assistive device for balance Standing balance-Leahy Scale: Poor Standing balance comment: pt with several episodes of LOB, pt reports falling 2x in december but none the last 2 months                             Pertinent Vitals/Pain Pain Assessment Pain Assessment: No/denies pain    Home Living Family/patient expects to be discharged to:: Private residence Living Arrangements: Alone (at first pt stated alone and then stated his ex-girlfriend and her kids stay with him) Available Help at Discharge: Family;Friend(s);Available 24 hours/day Type of Home: House Home Access: Stairs to enter Entrance Stairs-Rails: None Entrance Stairs-Number of Steps: 1   Home Layout: One level Home Equipment: None      Prior Function Prior Level of Function : Independent/Modified Independent             Mobility Comments: reports staying in bed most of the day watching TV, will drive to grocery store but he stays in the truck while his ex-gf does the shopping, reports not going out much, more so at night if he does ADLs Comments: indep     Hand Dominance   Dominant Hand: Right    Extremity/Trunk Assessment   Upper Extremity Assessment Upper Extremity Assessment: Overall WFL for tasks assessed    Lower Extremity Assessment Lower Extremity Assessment: Overall WFL for tasks assessed    Cervical / Trunk Assessment Cervical / Trunk Assessment: Normal  Communication   Communication: Expressive difficulties (pt difficulty to understand, doens't have teeth)  Cognition Arousal/Alertness: Awake/alert Behavior During Therapy: WFL for tasks assessed/performed Overall Cognitive Status: No family/caregiver present to determine baseline cognitive functioning                                  General Comments: pt reports "I'm hard headed." Pt dizzy and near falling catching self on wall however states "i don't need a walker". Pt with decreased safety awareness however suspect this to be baseline        General Comments General comments (skin integrity, edema, etc.): BP 111/68 in supine, 112/76 in sitting, 118/67 s/p ambulation    Exercises     Assessment/Plan    PT Assessment Patient needs continued PT services  PT Problem List Decreased strength;Decreased activity tolerance;Decreased balance;Decreased mobility;Decreased coordination;Decreased knowledge of use of DME;Decreased safety awareness       PT Treatment Interventions DME instruction;Gait training;Stair training;Functional mobility training;Therapeutic activities;Therapeutic exercise;Balance training;Neuromuscular re-education    PT Goals (Current goals can be found in the Care Plan section)  Acute Rehab PT Goals Patient Stated Goal: home PT Goal Formulation: With patient Time For Goal Achievement: 06/27/22 Potential to Achieve Goals: Good Additional Goals Additional Goal #1: Pt to score >19 on DGI to indicate minimal falls risk.    Frequency Min 3X/week     Co-evaluation               AM-PAC PT "6 Clicks" Mobility  Outcome Measure Help needed turning from your back to your side while in a flat bed without using bedrails?: None Help needed moving from lying on your back to sitting on the side of a flat bed without using bedrails?: None Help needed moving to and from a bed to a chair (including a wheelchair)?: A Little Help needed standing up from a chair using your arms (e.g., wheelchair or bedside chair)?: A Little Help needed to walk in hospital room?: A Lot Help needed climbing 3-5 steps with a railing? : A Lot 6 Click Score: 18    End of Session Equipment Utilized During Treatment: Gait belt Activity Tolerance: Patient limited by fatigue Patient left: in bed;with call bell/phone  within reach;with bed alarm set Nurse Communication: Mobility status PT Visit Diagnosis: Unsteadiness on feet (R26.81)    Time: [id number] PT Time Calculation (min) (ACUTE ONLY): 33 min   Charges:   PT Evaluation $PT Eval Moderate Complexity: 1 Mod PT Treatments $Gait Training: 8-22 mins        .[place] 06/13/2022, 1:20 PM

## 2022-06-14 LAB — CARDIOLIPIN ANTIBODIES, IGG, IGM, IGA
Anticardiolipin IgA: <9 APL U/mL (ref 0–11)
Anticardiolipin IgG: <9 GPL U/mL (ref 0–14)
Anticardiolipin IgM: <9 MPL U/mL (ref 0–12)

## 2022-06-14 LAB — BETA-2-GLYCOPROTEIN I ABS, IGG/M/A
Beta-2 Glyco I IgG: <9 GPI IgG units (ref 0–20)
Beta-2-Glycoprotein I IgA: 29 GPI IgA units — ABNORMAL HIGH (ref 0–25)
Beta-2-Glycoprotein I IgM: 20 GPI IgM units (ref 0–32)

## 2022-06-14 LAB — HOMOCYSTEINE: Homocysteine: 26.6 umol/L — ABNORMAL HIGH (ref 0.0–14.5)

## 2022-06-14 LAB — PROTEIN C, TOTAL: Protein C, Total: 114 % (ref 60–150)

## 2022-06-20 LAB — FACTOR 5 LEIDEN

## 2022-06-20 MED ORDER — STUDY - EVOLVE-MI - EVOLOCUMAB (REPATHA) 140 MG/ML SQ INJECTION (PI-STUCKEY): 140.0000 mg | INJECTION | SUBCUTANEOUS | 0 refills | Status: DC

## 2022-06-20 NOTE — Addendum Note (Signed)
Addended byAcey Lav D on: 06/20/2022 12:15 PM   Modules accepted: Orders

## 2022-06-21 ENCOUNTER — Other Ambulatory Visit: Payer: Self-pay

## 2022-06-21 ENCOUNTER — Telehealth: Payer: Self-pay

## 2022-06-21 DIAGNOSIS — D6859 Other primary thrombophilia: Secondary | ICD-10-CM

## 2022-06-21 LAB — PROTHROMBIN GENE MUTATION

## 2022-06-21 NOTE — Telephone Encounter (Signed)
Patient LVM asking for [place] to call us back about his upcoming appt, attempted to contact pt but VM was full.

## 2022-06-22 ENCOUNTER — Encounter: Payer: Self-pay | Admitting: Cardiology

## 2022-06-22 ENCOUNTER — Ambulatory Visit: Payer: 59 | Attending: Cardiology | Admitting: Cardiology

## 2022-06-22 VITALS — BP 104/72 | HR 84 | Ht 74.0 in | Wt 242.0 lb

## 2022-06-22 DIAGNOSIS — I10 Essential (primary) hypertension: Secondary | ICD-10-CM

## 2022-06-22 DIAGNOSIS — R2689 Other abnormalities of gait and mobility: Secondary | ICD-10-CM

## 2022-06-22 DIAGNOSIS — I2119 ST elevation (STEMI) myocardial infarction involving other coronary artery of inferior wall: Secondary | ICD-10-CM | POA: Diagnosis not present

## 2022-06-22 DIAGNOSIS — E118 Type 2 diabetes mellitus with unspecified complications: Secondary | ICD-10-CM | POA: Diagnosis not present

## 2022-06-22 DIAGNOSIS — I255 Ischemic cardiomyopathy: Secondary | ICD-10-CM

## 2022-06-22 DIAGNOSIS — N1831 Chronic kidney disease, stage 3a: Secondary | ICD-10-CM

## 2022-06-22 DIAGNOSIS — F17200 Nicotine dependence, unspecified, uncomplicated: Secondary | ICD-10-CM

## 2022-06-22 DIAGNOSIS — E782 Mixed hyperlipidemia: Secondary | ICD-10-CM

## 2022-06-22 MED ORDER — TICAGRELOR 90 MG PO TABS: 90.0000 mg | ORAL_TABLET | Freq: Two times a day (BID) | ORAL | 3 refills | Status: DC

## 2022-06-22 MED ORDER — ROSUVASTATIN CALCIUM 20 MG PO TABS: 40.0000 mg | ORAL_TABLET | Freq: Every day | ORAL | 3 refills | Status: DC

## 2022-06-22 MED ORDER — FUROSEMIDE 20 MG PO TABS: 20.0000 mg | ORAL_TABLET | Freq: Every day | ORAL | 3 refills | Status: DC | PRN

## 2022-06-22 MED ORDER — METOPROLOL SUCCINATE ER 25 MG PO TB24: 12.5000 mg | ORAL_TABLET | Freq: Every morning | ORAL | 3 refills | Status: DC

## 2022-06-22 NOTE — Patient Instructions (Signed)
Medication Instructions:  Your physician recommends that you continue on your current medications as directed. Please refer to the Current Medication list given to you today.   *If you need a refill on your cardiac medications before your next appointment, please call your pharmacy*   Lab Work: Your physician recommends that you return for lab work in: Tomorrow    If you have labs (blood work) drawn today and your tests are completely normal, you will receive your results only by: [place] (if you have MyChart) OR A paper copy in the mail If you have any lab test that is abnormal or we need to change your treatment, we will call you to review the results.   Testing/Procedures: NONE    Follow-Up: At [facility], you and your health needs are our priority.  As part of our continuing mission to provide you with exceptional heart care, we have created designated Provider Care Teams.  These Care Teams include your primary Cardiologist (physician) and Advanced Practice Providers (APPs -  Physician Assistants and Nurse Practitioners) who all work together to provide you with the care you need, when you need it.  We recommend signing up for the patient portal called "MyChart".  Sign up information is provided on this After Visit Summary.  MyChart is used to connect with patients for Virtual Visits (Telemedicine).  Patients are able to view lab/test results, encounter notes, upcoming appointments, etc.  Non-urgent messages can be sent to your provider as well.   To learn more about what you can do with MyChart, go to [email or web address].    Your next appointment:   6 week(s)  Provider:   You may see Carlyle Dolly, MD or one of the following Advanced Practice Providers on your designated Care Team:   Bernerd Pho, PA-C  Ermalinda Barrios, [place]     Other Instructions Thank you for choosing [facility]!

## 2022-06-22 NOTE — Progress Notes (Signed)
Cardiology Office Note:   Date:  06/22/2022  ID:  Michael [name], DOB [birth year], MRN [id number]  History of Present Illness:   Michael [name] is a 62 y.o. male with a history of coronary artery disease with inferior lateral STEMI in 03/2019 found to have occlusion of the distal LAD and distal left circumflex suspicious for coronary emboli, hypertension, hyperlipidemia, sarcoidosis (pulmonary and hepatic), chronic back pain, and chronic kidney disease stage III.  Recently admitted to the hospital on 06/10/2022 and found to have inferior ST elevation prominent teeing activation the STEMI team.  He had an embolic STEMI in [id number] with left heart catheterization showing occluded distal LAD and OM1 with spontaneous reperfusion of the LAD, low-level PTCA with distal left circumflex, echocardiogram revealed LVEF of 60 to 65%, normal RV, unclear source of emboli, no significant finding on echocardiogram and no A-fib noted during admission.  In 05/2019 he was admitted again with chest pain with troponin peaking of 5300, left heart catheterization again showed no significant disease, LV gram of 30-35%, cardiac MRI showed LVEF of 56%, inferior lateral hypokinesis with delayed enhancement consistent with scar.  He was admitted again into the hospital on 06/10/2022 with STEMI involving the RCA with left heart catheterization showed severe single-vessel coronary artery disease with occlusion of the mid RPDA, appearance is similar to distal LAD and left circumflex occlusions seen in 2020 and may reflect recurrent coronary embolism, mild RCA stenosis is similar to prior catheterization in 2021, mildly elevated left ventricular filling pressure 20 mmHg, attempted PTCA to RPDA occlusion, flow could not be reestablished despite multiple inflations.  Repeat echocardiogram revealed LVEF of 50%, left ventricle with low normal function, mild asymmetric left ventricular hypertrophy in the septal segment, trivial mitral valve regurgitation.   He was treated with Aggrastat for 18 hours and was subsequently able to be weaned off of IV nitroglycerin.  He was placed on DAPT of aspirin and Brilinta with recommendations to continue for minimum of 1 year.  It was felt again that the patient had an embolic event and would benefit from a hypercoagulable workup.  Studies were pending there was also the discussion of to consider TEE based cyst on his labs but can be done as an outpatient since labs continue to be pending.  He was unable to be started on ACE/ARB/spironolactone due to his renal function.  He was also recommended a referral to the lipid clinic.  Medication stopped on discharge were HCTZ and meclizine.  It was recommended he have a be met on follow-up as well as an FLP/LFTs in 8 weeks.  He returns to clinic today stating that overall he has been feeling well.  He continues to complain of dizziness and left arm pain with numbness in his fingers on his left hand which he states has been ongoing for approximately 1 year.  He states that since his discharge from the hospital he has not smoked.  He also states that he has been compliant with all of his medications.  In his hospitalization HCTZ was discontinued to help combat with the dizziness but he does not note any improvement in the symptoms at the time.  He denies any chest pain or chest pressure or palpitations, or shortness of breath..  He does endorses some peripheral edema.   ROS: Positive for dizziness, peripheral edema, left arm and hand numbness  Studies Reviewed:    EKG: Study completed 06/11/2022 during his hospitalization revealed sinus rhythm with a rate of 76, lateral infarct,  T wave inversions in V4 through V6  Cath: 3/2/20204   Conclusions: Severe single vessel coronary artery disease with occlusion of mid rPDA.  Appearance is similar to distal LAD and LCx occlusions seen in 2020 and may reflect recurrent coronary embolism.  Mild RCA stenosis is similar to prior catheterization  in 2021. Mildly elevated left ventricular filling pressure (LVEDP 20 mmHg). Attempted PTCA to rPDA occlusion; flow could not be reestablished despite multiple inflations with a 2.0 x 12 mm balloon.   Recommendations: Tirofiban infusion for 18 hours. Titrate nitroglycerin infusion for relief of chest pain. Dual antiplatelet therapy with aspirin and ticagrelor for at least 12 months. Obtain echocardiogram. Consider hypercoagulable workup and TEE/ILR to assess for potential causes of recurrent coronary occlusions suspicious for emboli. Aggressive secondary prevention of coronary artery disease.   Nelva Bush, MD [facility]   Echo: 06/11/2022   IMPRESSIONS     1. Left ventricular ejection fraction, by estimation, is 50%. The left  ventricle has low normal function. The left ventricle demonstrates  regional wall motion abnormalities (see scoring diagram/findings for  description). There is mild asymmetric left  ventricular hypertrophy of the septal segment. Left ventricular diastolic  parameters were grossly normal.   2. Right ventricular systolic function is normal. The right ventricular  size is normal. Tricuspid regurgitation signal is inadequate for assessing  PA pressure.   3. The mitral valve is normal in structure. Trivial mitral valve  regurgitation. No evidence of mitral stenosis.   4. The aortic valve is tricuspid. There is mild thickening of the aortic  valve. Aortic valve regurgitation is not visualized. No aortic stenosis is  present.   5. The inferior vena cava is normal in size with greater than 50%  respiratory variability, suggesting right atrial pressure of 3 mmHg.   FINDINGS   Left Ventricle: Left ventricular ejection fraction, by estimation, is  50%. The left ventricle has low normal function. The left ventricle  demonstrates regional wall motion abnormalities. The left ventricular  internal cavity size was normal in size.  There is mild asymmetric left  ventricular hypertrophy of the septal  segment. Left ventricular diastolic parameters were normal.    Risk Assessment/Calculations:              Physical Exam:   VS:  BP 104/72   Pulse 84   Ht '6\' 2"'$  (1.88 m)   Wt 242 lb (109.8 kg)   SpO2 96%   BMI 31.07 kg/m    Wt Readings from Last 3 Encounters:  06/22/22 242 lb (109.8 kg)  06/10/22 234 lb 12.6 oz (106.5 kg)  06/01/22 240 lb (108.9 kg)     GEN: Well nourished, well developed in no acute distress NECK: No JVD; No carotid bruits CARDIAC: RRR, no murmurs, rubs, gallops RESPIRATORY:  Clear to auscultation without rales, wheezing or rhonchi  ABDOMEN: Soft, non-tender, non-distended EXTREMITIES: Trace pretibial edema; No deformity   ASSESSMENT AND PLAN:    Coronary artery disease with recent inferior STEMI who underwent cardiac catheterization noted above with mid PDA occlusion with attempted PTCA that was unsuccessful despite multiple attempts.  Treated with Aggrastat for 18 hours.  Placed on DAPT with aspirin 81 mg daily and Brilinta 90 mg twice daily for at least 1 year.  It was felt the patient had an embolic event and would benefit from hypercoagulable workup.  Studies were pending.  After studies were completed recommendation is consider TEE.  Denies any current chest pain.  No new ischemic changes  noted on EKG.  He is continued on aspirin, Brilinta, rosuvastatin, and metoprolol.  Also discussed cardiac rehab and patient advised he would be receiving a call from them as well.  After his last NSTEMI NSTEMI he did cardiac rehab at home.  History of ischemic cardiomyopathy and its improved due to previous echo in 2021 showed LVEF of 25-30%.  Echocardiogram on this admission revealed LVEF of 50% with akinesis of the distal lateral wall and hypokinesis of the inferior wall.  He is continued on metoprolol.  Unable to do ACE ARB or Spyro with renal function.  He has been sent for be met today to reevaluate electrolyte and kidney  function.  Hypertension with blood pressure today of 104/72.  He has been continued on amlodipine, furosemide as needed, and metoprolol.  And refills were sent to the pharmacy of choice.  Hyperlipidemia with last LDL 205 and LP(a) of 233.5.  Urine he is continued on the EVOLVE-MI research study, ezetimibe, and rosuvastatin.  CKD stage III with elevated serum creatinine during his hospitalization of 1.78, 1.80, 1.46, 1.92.  He has been sent for being met to follow-up on his kidney function since being discharged from the hospital.  Diabetes with a hemoglobin A1c of 6.1.  This is managed by his PCP.  History of tobacco use.  Patient states that he has not smoked since discharge from the hospital.  He has been congratulated on stopping and encouraged to continue.    Disposition patient return to clinic to see MD/APP in 6 weeks or sooner if needed.   Cardiac Rehabilitation Eligibility Assessment           Signed, Markevious Ehmke, NP

## 2022-06-26 ENCOUNTER — Telehealth: Payer: Self-pay | Admitting: Cardiology

## 2022-06-26 ENCOUNTER — Ambulatory Visit (INDEPENDENT_AMBULATORY_CARE_PROVIDER_SITE_OTHER): Payer: 59 | Admitting: Neurology

## 2022-06-26 DIAGNOSIS — M5417 Radiculopathy, lumbosacral region: Secondary | ICD-10-CM | POA: Diagnosis not present

## 2022-06-26 DIAGNOSIS — G894 Chronic pain syndrome: Secondary | ICD-10-CM

## 2022-06-26 DIAGNOSIS — R209 Unspecified disturbances of skin sensation: Secondary | ICD-10-CM

## 2022-06-26 MED ORDER — ROSUVASTATIN CALCIUM 40 MG PO TABS: 40.0000 mg | ORAL_TABLET | Freq: Every day | ORAL | 3 refills | Status: DC

## 2022-06-26 NOTE — Telephone Encounter (Signed)
I spoke with Myra at [facility]. She bubble packs his medication and will switch 2-20 mg crestor for 1 -40 mg tablet. She said she did not need a new rx

## 2022-06-26 NOTE — Procedures (Signed)
[facility]  [address]  [place], [place] [address] Tel: [phone] Fax: [phone] Test Date:  06/26/2022  Patient: Michael [name] DOB: 1960-09-25 Physician: Kai Levins, MD  Sex: Male Height: 6\' 2"  Ref Phys: Kai Levins, MD  ID#: [id number]   Technician:    History: This is a 62 year old male with numbness and tingling in legs and hands.  NCV & EMG Findings: Extensive electrodiagnostic evaluation of the right upper and lower limbs with additional nerve conduction studies of the left upper limb shows: Right sural, superficial peroneal, median, ulnar, and radial sensory responses are within normal limits. Bilateral ulnar (ADM) motor responses show reduced amplitude (L5.9, R6.1 mV). Right peroneal/fibular (EDB), tibial (AH), and median (APB) are within normal limits. Chronic motor axon loss changes without accompanying active denervation changes are seen in the right tibialis anterior and gluteus medius muscles. All other tested muscles are within normal limits with normal motor unit configuration and recruitment patterns.  Impression: This is an abnormal study. The findings are most consistent with the following: The residuals of an old intraspinal canal lesion (ie: motor radiculopathy) at the right L5 root, mild in degree electrically. No electrodiagnostic evidence of a large fiber sensorimotor polyneuropathy. No electrodiagnostic evidence of a right cervical (C5-C8) motor radiculopathy. Reduced bilateral ulnar (ADM) motor responses are of unclear clinical significance given normal needle examination of ulnar innervated muscles and may be technical in nature. The findings are too limited in degree and distribution for definitive diagnosis.    ___________________________ Kai Levins, MD    Nerve Conduction Studies Motor Nerve Results    Latency Amplitude F-Lat Segment Distance CV Comment  Site (ms) Norm (mV) Norm (ms)  (cm) (m/s) Norm   Right  Fibular (EDB) Motor  Ankle 4.5  < 6.0 2.6  > 2.5        Bel fib head 14.2 - 2.1 -  Bel fib head-Ankle 39 40  > 40   Pop fossa 16.2 - 2.0 -  Pop fossa-Bel fib head 8 40 -   Right Median (APB) Motor  Wrist 2.9  < 4.0 9.7  > 6.0        Elbow 8.9 - 8.8 -  Elbow-Wrist 31 52  > 50   Right Tibial (AH) Motor  Ankle 4.3  < 6.0 12.1  > 4.0        Knee 15.7 - 9.3 -  Knee-Ankle 49 43  > 40   Left Ulnar (ADM) Motor  Wrist 2.2  < 3.1 *5.9  > 7.0        Bel elbow 7.4 - 5.8 -  Bel elbow-Wrist 26 50  > 50   Ab elbow 9.4 - 5.8 -  Ab elbow-Bel elbow 10 50 -   Right Ulnar (ADM) Motor  Wrist 2.5  < 3.1 *6.1  > 7.0        Bel elbow 7.6 - 5.5 -  Bel elbow-Wrist 26 51  > 50   Ab elbow 9.6 - 5.3 -  Ab elbow-Bel elbow 10 50 -    Sensory Sites    Neg Peak Lat Amplitude (O-P) Segment Distance Velocity Comment  Site (ms) Norm (V) Norm  (cm) (ms)   Right Median Sensory  Wrist-Dig II 3.4  < 3.6 16  > 15 Wrist-Dig II 13    Right Radial Sensory  Forearm-Wrist 2.4  < 2.7 15  > 14 Forearm-Wrist 10    Right Superficial Fibular Sensory  14 cm-Ankle  3.0  < 4.6 12  > 4 14 cm-Ankle 14    Right Sural Sensory  Calf-Lat mall 4.0  < 4.6 5  > 4 Calf-Lat mall 14    Right Ulnar Sensory  Wrist-Dig V 3.1  < 3.1 10  > 10 Wrist-Dig V 11     H-Reflex Results    M-Lat H Lat H Neg Amp H-M Lat  Site (ms) (ms) Norm (mV) (ms)  Right Tibial H-Reflex  Pop fossa 8.1 ---  < 35.0 --- ---   Electromyography   Side Muscle Ins.Act Fibs Fasc Recrt Amp Dur Poly Activation Comment  Right Tib ant Nml Nml Nml *2- *1+ *1+ *1+ Nml N/A  Right Gastroc MH Nml Nml Nml Nml Nml Nml Nml Nml N/A  Right Rectus fem Nml Nml Nml Nml Nml Nml Nml Nml N/A  Right Biceps fem SH Nml Nml Nml Nml Nml Nml Nml Nml N/A  Right Gluteus med Nml Nml Nml *1- *1+ *1+ *1+ Nml N/A  Right FDI Nml Nml Nml Nml Nml Nml Nml Nml N/A  Right EIP Nml Nml Nml Nml Nml Nml Nml Nml N/A  Right ADM Nml Nml Nml Nml Nml Nml Nml Nml N/A  Right Pronator teres Nml Nml Nml Nml Nml Nml  Nml Nml N/A  Right Biceps Nml Nml Nml Nml Nml Nml Nml Nml N/A  Right Triceps lat hd Nml Nml Nml Nml Nml Nml Nml Nml N/A  Right Deltoid Nml Nml Nml Nml Nml Nml Nml Nml N/A      Waveforms:  Motor             Sensory             H-Reflex

## 2022-06-26 NOTE — Telephone Encounter (Signed)
Pt c/o medication issue:  1. Name of Medication: rosuvastatin (CRESTOR) 20 MG tablet   2. How are you currently taking this medication (dosage and times per day)?    Take 2 tablets (40 mg total) by mouth at bedtime    3. Are you having a reaction (difficulty breathing--STAT)? no  4. What is your medication issue? Calling to see if the prescriptioin can just be for 40mg , to take 1 pill a day at bedtime. Please advise

## 2022-06-27 ENCOUNTER — Other Ambulatory Visit: Payer: Self-pay

## 2022-06-28 ENCOUNTER — Telehealth: Payer: Self-pay | Admitting: Cardiology

## 2022-06-28 MED ORDER — ROSUVASTATIN CALCIUM 40 MG PO TABS: 40.0000 mg | ORAL_TABLET | Freq: Every day | ORAL | 3 refills | Status: DC

## 2022-06-28 NOTE — Telephone Encounter (Signed)
Pt c/o medication issue:  1. Name of Medication: rosuvastatin (CRESTOR) 20 MG tablet   2. How are you currently taking this medication (dosage and times per day)?  Take 1 tablet (40 mg total) by mouth at bedtime.    3. Are you having a reaction (difficulty breathing--STAT)? No  4. What is your medication issue? Pt called stating that [facility] will not do the refill but [facility] [place], [place], [place] [address] will do the refill. Please advise

## 2022-06-28 NOTE — Addendum Note (Signed)
Addended by: Levonne Hubert on: 06/28/2022 04:59 PM   Modules accepted: Orders

## 2022-06-29 ENCOUNTER — Telehealth: Payer: Self-pay | Admitting: Cardiology

## 2022-06-29 NOTE — Telephone Encounter (Signed)
Pt asked that an order be sent to [facility] for him to get a cane for his knee pain.

## 2022-06-29 NOTE — Telephone Encounter (Signed)
Advised to contact PCP for cane prescription Verbalized understanding of plan

## 2022-07-11 ENCOUNTER — Telehealth: Payer: Self-pay | Admitting: Cardiology

## 2022-07-11 NOTE — Telephone Encounter (Signed)
Pt c/o medication issue:  1. Name of Medication:  evolocumab (REPATHA) 140 mg/mL SQ injection   2. How are you currently taking this medication (dosage and times per day)?  inject 1 mL (140 mg total) into the skin every 14 (fourteen) days.   3. Are you having a reaction (difficulty breathing--STAT)? no  4. What is your medication issue? Patient called saying the needle won't come out all the way.

## 2022-07-12 NOTE — Telephone Encounter (Signed)
Patient is returning call.  °

## 2022-07-12 NOTE — Telephone Encounter (Signed)
Left a message for patient to call office back regarding Repatha.

## 2022-07-12 NOTE — Telephone Encounter (Signed)
Patient notified and verbalized understanding. Patient stated he is getting full body cramps daily and is questioning if this could be a side effect of one of his medications.   Please advise.

## 2022-07-12 NOTE — Telephone Encounter (Signed)
Please advise patient to contact phone number on package. Company will ship him a replacement dose.

## 2022-07-13 ENCOUNTER — Encounter (HOSPITAL_COMMUNITY): Payer: Self-pay

## 2022-07-13 ENCOUNTER — Encounter (HOSPITAL_COMMUNITY)
Admission: RE | Admit: 2022-07-13 | Discharge: 2022-07-13 | Disposition: A | Discharge status: Home / Self Care | Payer: 59 | Source: Ambulatory Visit | Attending: Cardiology | Admitting: Cardiology

## 2022-07-13 VITALS — BP 90/70 | HR 98 | Ht 74.0 in | Wt 233.7 lb

## 2022-07-13 DIAGNOSIS — I2510 Atherosclerotic heart disease of native coronary artery without angina pectoris: Secondary | ICD-10-CM | POA: Insufficient documentation

## 2022-07-13 DIAGNOSIS — Z801 Family history of malignant neoplasm of trachea, bronchus and lung: Secondary | ICD-10-CM | POA: Insufficient documentation

## 2022-07-13 DIAGNOSIS — I2582 Chronic total occlusion of coronary artery: Secondary | ICD-10-CM | POA: Diagnosis present

## 2022-07-13 DIAGNOSIS — R634 Abnormal weight loss: Secondary | ICD-10-CM | POA: Insufficient documentation

## 2022-07-13 DIAGNOSIS — Z800 Family history of malignant neoplasm of digestive organs: Secondary | ICD-10-CM | POA: Diagnosis not present

## 2022-07-13 DIAGNOSIS — I2111 ST elevation (STEMI) myocardial infarction involving right coronary artery: Secondary | ICD-10-CM | POA: Insufficient documentation

## 2022-07-13 DIAGNOSIS — I252 Old myocardial infarction: Secondary | ICD-10-CM | POA: Insufficient documentation

## 2022-07-13 DIAGNOSIS — F17210 Nicotine dependence, cigarettes, uncomplicated: Secondary | ICD-10-CM | POA: Insufficient documentation

## 2022-07-13 DIAGNOSIS — Z87891 Personal history of nicotine dependence: Secondary | ICD-10-CM | POA: Insufficient documentation

## 2022-07-13 DIAGNOSIS — Z8051 Family history of malignant neoplasm of kidney: Secondary | ICD-10-CM | POA: Diagnosis not present

## 2022-07-13 NOTE — Addendum Note (Signed)
Encounter addended by: Dwana Melena, RN on: 07/13/2022 11:25 AM  Actions taken: Clinical Note Signed

## 2022-07-13 NOTE — Addendum Note (Signed)
Encounter addended by: Dwana Melena, RN on: 07/13/2022 11:49 AM  Actions taken: Order Reconciliation Section accessed, Medication List reviewed, Home Medications modified, Clinical Note Signed

## 2022-07-13 NOTE — Progress Notes (Addendum)
Cardiac Individual Treatment Plan  Patient Details  Name: Michael [name] MRN: [id number] Date of Birth: 04/14/1960 Referring Provider:   Flowsheet Row CARDIAC REHAB PHASE II ORIENTATION from 07/13/2022 in [facility]  Referring Provider Dr. Saunders Revel       Initial Encounter Date:  Flowsheet Row CARDIAC REHAB PHASE II ORIENTATION from 07/13/2022 in [facility]  Date 07/13/22       Visit Diagnosis: ST elevation myocardial infarction involving right coronary artery  Patient's Home Medications on Admission:  Current Outpatient Medications:    albuterol (PROVENTIL) (2.5 MG/3ML) 0.083% nebulizer solution, Take 2.5 mg by nebulization every 6 (six) hours as needed., Disp: , Rfl:    albuterol (VENTOLIN HFA) 108 (90 Base) MCG/ACT inhaler, Inhale 2 puffs into the lungs every 6 (six) hours as needed for wheezing or shortness of breath., Disp: 8 g, Rfl: 5   amLODipine (NORVASC) 5 MG tablet, TAKE ONE TABLET BY MOUTH DAILY (MORNING), Disp: 30 tablet, Rfl: 6   ARIPiprazole (ABILIFY) 10 MG tablet, Take 10 mg by mouth daily., Disp: , Rfl:    aspirin EC 81 MG tablet, Take 1 tablet (81 mg total) by mouth daily. Swallow whole., Disp: 90 tablet, Rfl: 3   Cholecalciferol 1.25 MG (50000 UT) capsule, Take 50,000 Units by mouth daily., Disp: , Rfl:    clopidogrel (PLAVIX) 75 MG tablet, Take 75 mg by mouth daily. Medication was on patient's list from [facility] and he stated he was taking., Disp: , Rfl:    cyclobenzaprine (FLEXERIL) 10 MG tablet, Take 1 tablet by mouth at bedtime as needed., Disp: , Rfl:    ezetimibe (ZETIA) 10 MG tablet, Take 1 tablet (10 mg total) by mouth daily., Disp: 90 tablet, Rfl: 0   FLUoxetine (PROZAC) 20 MG capsule, Take 20 mg by mouth every morning., Disp: , Rfl:    furosemide (LASIX) 20 MG tablet, Take 1 tablet (20 mg total) by mouth daily as needed., Disp: 90 tablet, Rfl: 3   hydrochlorothiazide (MICROZIDE) 12.5 MG capsule, Take 12.5 mg by  mouth every morning., Disp: , Rfl:    meclizine (ANTIVERT) 25 MG tablet, Take 25 mg by mouth 3 (three) times daily. Medication on list from [facility] patient brought with him. He stated he was taking TID., Disp: , Rfl:    metformin (FORTAMET) 500 MG (OSM) 24 hr tablet, Take 500 mg by mouth 1 day or 1 dose., Disp: , Rfl:    methocarbamol (ROBAXIN) 750 MG tablet, Take 750 mg by mouth every 8 (eight) hours., Disp: , Rfl:    metoprolol succinate (TOPROL-XL) 25 MG 24 hr tablet, Take 1/2 tablet (12.5 mg total) by mouth every morning., Disp: 90 tablet, Rfl: 3   Nebulizer MISC, machine, Disp: , Rfl:    nitroGLYCERIN (NITROSTAT) 0.4 MG SL tablet, Place 1 tablet (0.4 mg total) under the tongue every 5 (five) minutes as needed for chest pain., Disp: 25 tablet, Rfl: 1   oxyCODONE (OXY IR/ROXICODONE) 5 MG immediate release tablet, Take 10 mg by mouth as needed for severe pain. Takes 10mg , Disp: , Rfl:    pregabalin (LYRICA) 200 MG capsule, Take 200 mg by mouth in the morning, at noon, and at bedtime., Disp: , Rfl:    rosuvastatin (CRESTOR) 40 MG tablet, Take 1 tablet (40 mg total) by mouth at bedtime., Disp: 90 tablet, Rfl: 3   Study - EVOLVE-MI - evolocumab (REPATHA) 140 mg/mL SQ injection (PI-Stuckey), Inject 1 mL (140 mg total) into the skin  every 14 (fourteen) days. For Investigational Use Only. Inject subcutaneously into abdomen, thigh, or upper arm every 14 days. Rotate injection sites and do not inject into areas where skin is tender, bruised, or red. Please contact [facility] for any questions or concerns regarding this medication., Disp: 12 mL, Rfl: 0   ticagrelor (BRILINTA) 90 MG TABS tablet, Take 1 tablet (90 mg total) by mouth 2 (two) times daily., Disp: 180 tablet, Rfl: 3   Vitamin D, Ergocalciferol, 50000 units CAPS, Take 1 capsule by mouth once a week., Disp: , Rfl:    zolpidem (AMBIEN) 10 MG tablet, Take 10 mg by mouth at bedtime as needed for sleep., Disp: , Rfl:    trazodone  (DESYREL) 300 MG tablet, Take 300 mg by mouth at bedtime. (Patient not taking: Reported on 07/13/2022), Disp: , Rfl:   Past Medical History: Past Medical History:  Diagnosis Date   Allergic rhinitis    Arthritis    Chronic low back pain    follwed in [facility](Dr.Kirchmayer)   CKD (chronic kidney disease), stage II    Depression    Depression with anxiety    GERD (gastroesophageal reflux disease)    History of kidney stones    passed   HLD (hyperlipidemia) 03/23/2019   Insomnia    Migraines    NSTEMI (non-ST elevated myocardial infarction)    x2   Sarcoidosis    Multisystem,pulmonary and hepatic (Dr.Rourk)Liver bx in 06 mildly postive AMA but on re-check normal   Sleep apnea    "tested, but said it wasnt bad enough for a machine".    Tobacco Use: Social History   Tobacco Use  Smoking Status Every Day   Packs/day: 0.50   Years: 29.00   Additional pack years: 0.00   Total pack years: 14.50   Types: Cigarettes  Smokeless Tobacco Never  Tobacco Comments   Smokes 1/2 pack per day--09/20/20/ 3-4 cigs a day 06/01/22    Labs: Review Flowsheet       Latest Ref Rng & Units 03/20/2019 05/20/2019 06/01/2022 06/10/2022  Labs for ITP Cardiac and Pulmonary Rehab  Cholestrol 0 - 200 mg/dL 165  173  221  - 263   LDL (calc) 0 - 99 mg/dL 106  97  147  - 205   HDL-C >40 mg/dL 48  48  47  - 36   Trlycerides <150 mg/dL 56  140  135  - 111   Hemoglobin A1c 4.8 - 5.6 % 6.2  - 6.3  6.1   TCO2 22 - 32 mmol/L 24  - - 20     Capillary Blood Glucose: Lab Results  Component Value Date   GLUCAP 118 (H) 06/13/2022   GLUCAP 81 06/13/2022   GLUCAP 95 06/12/2022   GLUCAP 104 (H) 06/12/2022   GLUCAP 135 (H) 06/12/2022     Exercise Target Goals: Exercise Program Goal: Individual exercise prescription set using results from initial 6 min walk test and THRR while considering  patient's activity barriers and safety.   Exercise Prescription Goal: Starting with aerobic activity 30 plus minutes  a day, 3 days per week for initial exercise prescription. Provide home exercise prescription and guidelines that participant acknowledges understanding prior to discharge.  Activity Barriers & Risk Stratification:  Activity Barriers & Cardiac Risk Stratification - 07/13/22 0808       Activity Barriers & Cardiac Risk Stratification   Activity Barriers Arthritis;Left Hip Replacement;Right Hip Replacement;Joint Problems;Deconditioning;Shortness of Breath;Back Problems;Chest Pain/Angina;Balance Concerns    Cardiac  Risk Stratification High             6 Minute Walk:  6 Minute Walk     Row Name 07/13/22 0919         6 Minute Walk   Phase Initial     Distance 1200 feet     Walk Time 6 minutes     # of Rest Breaks 0     MPH 2.27     METS 3.37     RPE 11     VO2 Peak 11.82     Symptoms Yes (comment)     Comments B hip pain (6/10)     Resting HR 98 bpm     Resting BP 90/70     Resting Oxygen Saturation  99 %     Exercise Oxygen Saturation  during 6 min walk 98 %     Max Ex. HR 125 bpm     Max Ex. BP 118/72     2 Minute Post BP 110/70              Oxygen Initial Assessment:   Oxygen Re-Evaluation:   Oxygen Discharge (Final Oxygen Re-Evaluation):   Initial Exercise Prescription:  Initial Exercise Prescription - 07/13/22 0900       Date of Initial Exercise RX and Referring Provider   Date 07/13/22    Referring Provider Dr. Saunders Revel    Expected Discharge Date 10/04/22      Treadmill   MPH 1    Grade 0    Minutes 22      NuStep   Level 1    SPM 65    Minutes 17      Prescription Details   Frequency (times per week) 3    Duration Progress to 30 minutes of continuous aerobic without signs/symptoms of physical distress      Intensity   THRR 40-80% of Max Heartrate 64-129    Ratings of Perceived Exertion 11-13      Resistance Training   Training Prescription Yes    Weight 4    Reps 10-15             Perform Capillary Blood Glucose checks as  needed.  Exercise Prescription Changes:   Exercise Comments:   Exercise Goals and Review:   Exercise Goals     Row Name 07/13/22 0921             Exercise Goals   Increase Physical Activity Yes       Intervention Provide advice, education, support and counseling about physical activity/exercise needs.;Develop an individualized exercise prescription for aerobic and resistive training based on initial evaluation findings, risk stratification, comorbidities and participant's personal goals.       Expected Outcomes Long Term: Add in home exercise to make exercise part of routine and to increase amount of physical activity.;Short Term: Attend rehab on a regular basis to increase amount of physical activity.;Long Term: Exercising regularly at least 3-5 days a week.       Increase Strength and Stamina Yes       Intervention Provide advice, education, support and counseling about physical activity/exercise needs.;Develop an individualized exercise prescription for aerobic and resistive training based on initial evaluation findings, risk stratification, comorbidities and participant's personal goals.       Expected Outcomes Short Term: Increase workloads from initial exercise prescription for resistance, speed, and METs.;Short Term: Perform resistance training exercises routinely during rehab and add in resistance training at home;Long Term: Improve  cardiorespiratory fitness, muscular endurance and strength as measured by increased METs and functional capacity (6MWT)       Able to understand and use rate of perceived exertion (RPE) scale Yes       Intervention Provide education and explanation on how to use RPE scale       Expected Outcomes Short Term: Able to use RPE daily in rehab to express subjective intensity level;Long Term:  Able to use RPE to guide intensity level when exercising independently       Knowledge and understanding of Target Heart Rate Range (THRR) Yes       Intervention  Provide education and explanation of THRR including how the numbers were predicted and where they are located for reference       Expected Outcomes Short Term: Able to state/look up THRR;Long Term: Able to use THRR to govern intensity when exercising independently;Short Term: Able to use daily as guideline for intensity in rehab       Able to check pulse independently Yes       Intervention Provide education and demonstration on how to check pulse in carotid and radial arteries.;Review the importance of being able to check your own pulse for safety during independent exercise       Expected Outcomes Short Term: Able to explain why pulse checking is important during independent exercise;Long Term: Able to check pulse independently and accurately       Understanding of Exercise Prescription Yes       Intervention Provide education, explanation, and written materials on patient's individual exercise prescription       Expected Outcomes Short Term: Able to explain program exercise prescription;Long Term: Able to explain home exercise prescription to exercise independently                Exercise Goals Re-Evaluation :    Discharge Exercise Prescription (Final Exercise Prescription Changes):   Nutrition:  Target Goals: Understanding of nutrition guidelines, daily intake of sodium 1500mg , cholesterol 200mg , calories 30% from fat and 7% or less from saturated fats, daily to have 5 or more servings of fruits and vegetables.  Biometrics:  Pre Biometrics - 07/13/22 0922       Pre Biometrics   Height 6\' 2"  (1.88 m)    Weight 106 kg    Waist Circumference 42 inches    Hip Circumference 42 inches    Waist to Hip Ratio 1 %    BMI (Calculated) 29.99    Triceps Skinfold 20 mm    % Body Fat 29.6 %    Grip Strength 35.2 kg    Flexibility 0 in    Single Leg Stand 0 seconds              Nutrition Therapy Plan and Nutrition Goals:  Nutrition Therapy & Goals - 07/13/22 0918        Personal Nutrition Goals   Comments Patient scored 39 on his diet assessment. He trys to follow a low sodium diet. Handout provided and explained on heart heatlhier choices and RD referral offered. Patient declinced. We offer educational sessions on heart healthy nutrition with handouts.      Intervention Plan   Intervention Nutrition handout(s) given to patient.    Expected Outcomes Short Term Goal: Understand basic principles of dietary content, such as calories, fat, sodium, cholesterol and nutrients.             Nutrition Assessments:  Nutrition Assessments - 07/13/22 [id number]  MEDFICTS Scores   Pre Score 39            MEDIFICTS Score Key: ?70 Need to make dietary changes  40-70 Heart Healthy Diet ? 40 Therapeutic Level Cholesterol Diet   Picture Your Plate Scores: D34-534 Unhealthy dietary pattern with much room for improvement. 41-50 Dietary pattern unlikely to meet recommendations for good health and room for improvement. 51-60 More healthful dietary pattern, with some room for improvement.  >60 Healthy dietary pattern, although there may be some specific behaviors that could be improved.    Nutrition Goals Re-Evaluation:   Nutrition Goals Discharge (Final Nutrition Goals Re-Evaluation):   Psychosocial: Target Goals: Acknowledge presence or absence of significant depression and/or stress, maximize coping skills, provide positive support system. Participant is able to verbalize types and ability to use techniques and skills needed for reducing stress and depression.  Initial Review & Psychosocial Screening:  Initial Psych Review & Screening - 07/13/22 0953       Initial Review   Current issues with Current Depression;Current Anxiety/Panic;History of Depression;Current Psychotropic Meds      Family Dynamics   Good Support System? Yes      Barriers   Psychosocial barriers to participate in program The patient should benefit from training in stress management  and relaxation.      Screening Interventions   Interventions Encouraged to exercise    Expected Outcomes Short Term goal: Utilizing psychosocial counselor, staff and physician to assist with identification of specific Stressors or current issues interfering with healing process. Setting desired goal for each stressor or current issue identified.;Short Term goal: Identification and review with participant of any Quality of Life or Depression concerns found by scoring the questionnaire.             Quality of Life Scores:  Quality of Life - 07/13/22 0922       Quality of Life   Select Quality of Life      Quality of Life Scores   Health/Function Pre 17.8 %    Socioeconomic Pre 19.9 %    Psych/Spiritual Pre 22.07 %    Family Pre 24 %    GLOBAL Pre 19.77 %            Scores of 19 and below usually indicate a poorer quality of life in these areas.  A difference of  2-3 points is a clinically meaningful difference.  A difference of 2-3 points in the total score of the Quality of Life Index has been associated with significant improvement in overall quality of life, self-image, physical symptoms, and general health in studies assessing change in quality of life.  PHQ-9: Review Flowsheet       07/13/2022 05/09/2018  Depression screen PHQ 2/9  Decreased Interest 1 0  Down, Depressed, Hopeless 1 0  PHQ - 2 Score 2 0  Altered sleeping 1 -  Tired, decreased energy 1 -  Change in appetite 0 -  Feeling bad or failure about yourself  0 -  Trouble concentrating 1 -  Moving slowly or fidgety/restless 1 -  Suicidal thoughts 0 -  PHQ-9 Score 6 -  Difficult doing work/chores Somewhat difficult -   Interpretation of Total Score  Total Score Depression Severity:  1-4 = Minimal depression, 5-9 = Mild depression, 10-14 = Moderate depression, 15-19 = Moderately severe depression, 20-27 = Severe depression   Psychosocial Evaluation and Intervention:  Psychosocial Evaluation - 07/13/22  1006       Psychosocial Evaluation & Interventions  Interventions Stress management education;Relaxation education;Encouraged to exercise with the program and follow exercise prescription    Comments Patient scored 6 on his PHQ-9. He says his chronic pain may be a barrier to participate in the program but no psychosocial barriers identified. He has a long history of depression and anxiety which is being managed with fluoxetine and Abilify. He says he feels his depression and anxiety are managed with the medication. He lives alone but reports having 2 daughers age 78 and 28. He says he is currently going through a divorce and he signed documentation yesterday. He says custody of the children has not been decided. He names several brothers as his support people. He says he has 30 siblings living all over the [place]. He did not provide any detailed answers. We says he does want to try and see if he can do the program and hopes his pain is not going to be an issue.    Expected Outcomes Patient's depression and anxiety will continue to be managed with medications and he will continue to have no psychosocial barriers identified.    Continue Psychosocial Services  No Follow up required             Psychosocial Re-Evaluation:   Psychosocial Discharge (Final Psychosocial Re-Evaluation):   Vocational Rehabilitation: Provide vocational rehab assistance to qualifying candidates.   Vocational Rehab Evaluation & Intervention:  Vocational Rehab - 07/13/22 [id number]       Initial Vocational Rehab Evaluation & Intervention   Assessment shows need for Vocational Rehabilitation No      Vocational Rehab Re-Evaulation   Comments Patient is disabled and does not need vocational rehab.             Education: Education Goals: Education classes will be provided on a weekly basis, covering required topics. Participant will state understanding/return demonstration of topics presented.  Learning  Barriers/Preferences:  Learning Barriers/Preferences - 07/13/22 0920       Learning Barriers/Preferences   Learning Barriers None             Education Topics: Hypertension, Hypertension Reduction -Define heart disease and high blood pressure. Discus how high blood pressure affects the body and ways to reduce high blood pressure.   Exercise and Your Heart -Discuss why it is important to exercise, the FITT principles of exercise, normal and abnormal responses to exercise, and how to exercise safely.   Angina -Discuss definition of angina, causes of angina, treatment of angina, and how to decrease risk of having angina.   Cardiac Medications -Review what the following cardiac medications are used for, how they affect the body, and side effects that may occur when taking the medications.  Medications include Aspirin, Beta blockers, calcium channel blockers, ACE Inhibitors, angiotensin receptor blockers, diuretics, digoxin, and antihyperlipidemics.   Congestive Heart Failure -Discuss the definition of CHF, how to live with CHF, the signs and symptoms of CHF, and how keep track of weight and sodium intake.   Heart Disease and Intimacy -Discus the effect sexual activity has on the heart, how changes occur during intimacy as we age, and safety during sexual activity.   Smoking Cessation / COPD -Discuss different methods to quit smoking, the health benefits of quitting smoking, and the definition of COPD.   Nutrition I: Fats -Discuss the types of cholesterol, what cholesterol does to the heart, and how cholesterol levels can be controlled.   Nutrition II: Labels -Discuss the different components of food labels and how to read food label  Heart Parts/Heart Disease and PAD -Discuss the anatomy of the heart, the pathway of blood circulation through the heart, and these are affected by heart disease.   Stress I: Signs and Symptoms -Discuss the causes of stress, how stress  may lead to anxiety and depression, and ways to limit stress.   Stress II: Relaxation -Discuss different types of relaxation techniques to limit stress.   Warning Signs of Stroke / TIA -Discuss definition of a stroke, what the signs and symptoms are of a stroke, and how to identify when someone is having stroke.   Knowledge Questionnaire Score:  Knowledge Questionnaire Score - 07/13/22 0920       Knowledge Questionnaire Score   Pre Score 20/24             Core Components/Risk Factors/Patient Goals at Admission:  Personal Goals and Risk Factors at Admission - 07/13/22 0921       Core Components/Risk Factors/Patient Goals on Admission    Weight Management Yes    Intervention Weight Management/Obesity: Establish reasonable short term and long term weight goals.;Obesity: Provide education and appropriate resources to help participant work on and attain dietary goals.    Admit Weight 233 lb 14.4 oz (106.1 kg)    Goal Weight: Long Term 223 lb (101.2 kg)    Tobacco Cessation Yes    Number of packs per day 3    Intervention Offer self-teaching materials, assist with locating and accessing local/national Quit Smoking programs, and support quit date choice.;Assist the participant in steps to quit. Provide individualized education and counseling about committing to Tobacco Cessation, relapse prevention, and pharmacological support that can be provided by physician.    Expected Outcomes Long Term: Complete abstinence from all tobacco products for at least 12 months from quit date.;Short Term: Will quit all tobacco product use, adhering to prevention of relapse plan.    Improve shortness of breath with ADL's Yes    Intervention Provide education, individualized exercise plan and daily activity instruction to help decrease symptoms of SOB with activities of daily living.    Expected Outcomes Short Term: Improve cardiorespiratory fitness to achieve a reduction of symptoms when performing  ADLs;Long Term: Be able to perform more ADLs without symptoms or delay the onset of symptoms    Hypertension Yes    Intervention Provide education on lifestyle modifcations including regular physical activity/exercise, weight management, moderate sodium restriction and increased consumption of fresh fruit, vegetables, and low fat dairy, alcohol moderation, and smoking cessation.;Monitor prescription use compliance.    Expected Outcomes Short Term: Continued assessment and intervention until BP is < 140/56mm HG in hypertensive participants. < 130/58mm HG in hypertensive participants with diabetes, heart failure or chronic kidney disease.;Long Term: Maintenance of blood pressure at goal levels.    Lipids Yes    Intervention Provide education and support for participant on nutrition & aerobic/resistive exercise along with prescribed medications to achieve LDL 70mg , HDL >40mg .    Expected Outcomes Short Term: Participant states understanding of desired cholesterol values and is compliant with medications prescribed. Participant is following exercise prescription and nutrition guidelines.;Long Term: Cholesterol controlled with medications as prescribed, with individualized exercise RX and with personalized nutrition plan. Value goals: LDL < 70mg , HDL > 40 mg.    Personal Goal Other Yes    Personal Goal Patient wants to lose weight and breathe better.    Intervention Patient will attend [facility] 3 days/week with exercise and education.    Expected Outcomes Patient will complete the program meeting both  personal and program goals.             Core Components/Risk Factors/Patient Goals Review:    Core Components/Risk Factors/Patient Goals at Discharge (Final Review):    ITP Comments:   Comments: Patient arrived for 1st visit/orientation/education at 0800. Patient was referred to CR by Dr. Harrell Gave End due to STEMI involving right coronary artery (I21.11). During orientation advised patient on  arrival and appointment times what to wear, what to do before, during and after exercise. Reviewed attendance and class policy.  Pt is scheduled to return Cardiac Rehab on 07/17/22 at 1100. Pt was advised to come to class 15 minutes before class starts.  Discussed RPE/Dpysnea scales. Patient participated in warm up stretches. Patient was able to complete 6 minute walk test.  Telemetry:NSR. Patient was measured for the equipment. Discussed equipment safety with patient. Took patient pre-anthropometric measurements. Patient finished visit at [place].

## 2022-07-13 NOTE — Telephone Encounter (Signed)
Spoke with patient, reports he hs whole body cramps for past 10 years but after his heart attack his cramps are worst. Repatha was restarted after his heart attack  Current medications that could cause muscle cramps   Albuterol- only uses 2 puffs per day PRN Furosemide- only takes once week Rosuvastatin   Advised patient to cut down rosuvastatin dose in half (from 40 mg to 20 mg daily) for a week and see if that reduces muscle cramps.   Lab Results  Component Value Date   CHOL 263 (H) 06/10/2022   HDL 36 (L) 06/10/2022   LDLCALC 205 (H) 06/10/2022   TRIG 111 06/10/2022   CHOLHDL 7.3 06/10/2022

## 2022-07-13 NOTE — Addendum Note (Signed)
Encounter addended by: Dwana Melena, RN on: 07/13/2022 2:04 PM  Actions taken: Order Reconciliation Section accessed, Home Medications modified, Medication taking status modified, Medication List reviewed

## 2022-07-17 ENCOUNTER — Encounter (HOSPITAL_COMMUNITY)
Admission: RE | Admit: 2022-07-17 | Discharge: 2022-07-17 | Disposition: A | Discharge status: Home / Self Care | Payer: 59 | Source: Ambulatory Visit | Attending: Cardiology | Admitting: Cardiology

## 2022-07-17 ENCOUNTER — Telehealth: Payer: Self-pay | Admitting: *Deleted

## 2022-07-17 ENCOUNTER — Telehealth: Payer: Self-pay | Admitting: Cardiology

## 2022-07-17 VITALS — Wt 232.1 lb

## 2022-07-17 DIAGNOSIS — I2510 Atherosclerotic heart disease of native coronary artery without angina pectoris: Secondary | ICD-10-CM | POA: Diagnosis not present

## 2022-07-17 DIAGNOSIS — I2111 ST elevation (STEMI) myocardial infarction involving right coronary artery: Secondary | ICD-10-CM

## 2022-07-17 LAB — GLUCOSE, CAPILLARY: Glucose-Capillary: 194 mg/dL — ABNORMAL HIGH (ref 70–99)

## 2022-07-17 NOTE — Progress Notes (Signed)
Daily Session Note  Patient Details  Name: Michael [name] MRN: [id number] Date of Birth: August 07, 1960 Referring Provider:   Flowsheet Row CARDIAC REHAB PHASE II ORIENTATION from 07/13/2022 in [facility] CARDIAC REHABILITATION  Referring Provider Dr. Okey Dupre       Encounter Date: 07/17/2022  Check In:  Session Check In - 07/17/22 1109       Check-In   Supervising physician immediately available to respond to emergencies [facility] MD immediately available    Physician(s) Dr Wyline Mood    Location [facility]    Staff Present Ross Ludwig, BS, Exercise Physiologist;Debra Laural Benes, RN, Pleas Koch, RN, BSN;Horen Fletcher MHA, MS, ACSM-CEP    Virtual Visit No    Medication changes reported     No    Fall or balance concerns reported    Yes    Comments Patient reports poor balance and history of 8 to 15 falls in past year.    Tobacco Cessation No Change    Warm-up and Cool-down Performed as group-led instruction    Resistance Training Performed Yes      Pain Assessment   Currently in Pain? No/denies    Pain Score 0-No pain    Multiple Pain Sites No             Capillary Blood Glucose: No results found for this or any previous visit (from the past 24 hour(s)).    Social History   Tobacco Use  Smoking Status Every Day   Packs/day: 0.50   Years: 29.00   Additional pack years: 0.00   Total pack years: 14.50   Types: Cigarettes  Smokeless Tobacco Never  Tobacco Comments   Smokes 1/2 pack per day--09/20/20/ 3-4 cigs a day 06/01/22    Goals Met:  Independence with exercise equipment Exercise tolerated well No report of concerns or symptoms today Strength training completed today  Goals Unmet:  Not Applicable  Comments: Checkout at 1200.   Dr. [name] is Medical Director for [facility]

## 2022-07-17 NOTE — Telephone Encounter (Signed)
Patient called [place] office about auto injector not working for his REPATHA drug  that he receives in the [employer]. The sponsor [employer] has sent me a fax asking was it ok to send the patient two auto injectors. Left message for patient to call me back to see if second auto injector worked.   [place] Mera Gunkel, Research Coordinator  07/17/2022  15:14

## 2022-07-17 NOTE — Telephone Encounter (Signed)
Pt c/o medication issue:  1. Name of Medication:   Study - EVOLVE-MI - evolocumab (REPATHA) 140 mg/mL SQ injection (PI-Stuckey)   2. How are you currently taking this medication (dosage and times per day)?   Yes  3. Are you having a reaction (difficulty breathing--STAT)?   No  4. What is your medication issue?   Patient stated when he received his medication the needles were not coming out of the injector.  Patient will need new medication.

## 2022-07-17 NOTE — Progress Notes (Signed)
[facility] [address] [place] [address]   Clinic Day:  07/17/2022  Referring physician: Corrin Parker, PA-C  Patient Care Team: Antoine Poche, MD as PCP - General (Cardiology) Wyline Mood Dorothe Pea, MD as PCP - Cardiology (Cardiology) Jena Gauss Gerrit Friends, MD as Consulting Physician (Gastroenterology) Antony Madura, MD as Consulting Physician (Neurology)   ASSESSMENT & PLAN:   Assessment: ***  Plan: ***  No orders of the defined types were placed in this encounter.     I,Katie Daubenspeck,acting as a Neurosurgeon for Doreatha Massed, MD.,have documented all relevant documentation on the behalf of Doreatha Massed, MD,as directed by  Doreatha Massed, MD while in the presence of Doreatha Massed, MD.   ***  Katie Daubenspeck   4/8/20249:37 PM  CHIEF COMPLAINT/PURPOSE OF CONSULT:   Diagnosis: ***  Current Therapy:  ***  HISTORY OF PRESENT ILLNESS:   Michael [name] is a 62 y.o. male presenting to clinic today for evaluation of hypercoagulable state at the request of CVD.  Today, he states that he is doing well overall. His appetite level is at ***%. His energy level is at ***%.  ***  PAST MEDICAL HISTORY:   Past Medical History: Past Medical History:  Diagnosis Date   Allergic rhinitis    Arthritis    Chronic low back pain    follwed in Pain Clinic(Dr.Kirchmayer)   CKD (chronic kidney disease), stage II    Depression    Depression with anxiety    GERD (gastroesophageal reflux disease)    History of kidney stones    passed   HLD (hyperlipidemia) 03/23/2019   Insomnia    Migraines    NSTEMI (non-ST elevated myocardial infarction)    x2   Sarcoidosis    Multisystem,pulmonary and hepatic (Dr.Rourk)Liver bx in 06 mildly postive AMA but on re-check normal   Sleep apnea    "tested, but said it wasnt bad enough for a machine".    Surgical History: Past Surgical History:  Procedure Laterality Date   BACK SURGERY  2012   2nd  back surgery 3 monthes ago and in 2007   CARDIAC CATHETERIZATION  05/20/2019   COLONOSCOPY  11/05/2003   [id number]:[id number] internal hemorrhoids, otherwise normal rectum and colon   CORONARY/GRAFT ACUTE MI REVASCULARIZATION N/A 03/20/2019   Procedure: Coronary/Graft Acute MI Revascularization;  Surgeon: Lennette Bihari, MD;  [facility] INVAS[facility] CV LAB;  Service: Cardiovascular;  Laterality: N/A;   CORONARY/GRAFT ACUTE MI REVASCULARIZATION N/A 06/10/2022   Procedure: Coronary/Graft Acute MI Revascularization;  Surgeon: Yvonne Kendall, MD;  Location: [facility] INVASIVE CV LAB;  Service: Cardiovascular;  Laterality: N/A;   ESOPHAGOGASTRODUODENOSCOPY  03/22/2004   [id number]:[id number] esophagogastroduodenoscopy. Small hiatal hernia otherwise normal stomach   HARVEST BONE GRAFT Left    thigh   HIP ARTHROPLASTY Bilateral    left 2017, right 2016   HIP SURGERY Left 2017   bone graft   LAMINECTOMY  2009   Balateral L4-L5   LEFT HEART CATH AND CORONARY ANGIOGRAPHY N/A 03/20/2019   Procedure: LEFT HEART CATH AND CORONARY ANGIOGRAPHY;  Surgeon: Lennette Bihari, MD;  Location: [facility] INVASIVE CV LAB;  Service: Cardiovascular;  Laterality: N/A;   LEFT HEART CATH AND CORONARY ANGIOGRAPHY N/A 05/20/2019   Procedure: LEFT HEART CATH AND CORONARY ANGIOGRAPHY;  Surgeon: Marykay Lex, MD;  Location: [facility] INVASIVE CV LAB;  Service: Cardiovascular;  Laterality: N/A;   LEFT HEART CATH AND CORONARY ANGIOGRAPHY N/A 06/10/2022   Procedure: LEFT HEART CATH AND CORONARY ANGIOGRAPHY;  Surgeon: Yvonne Kendall, MD;  Location: [facility] INVASIVE CV LAB;  Service: Cardiovascular;  Laterality: N/A;   RESECTION DISTAL CLAVICAL Right 01/23/2019   Procedure: OPEN DISTAL CLAVICLE EXCISION;  Surgeon: Vickki Hearing, MD;  Location: [facility] ORS;  Service: Orthopedics;  Laterality: Right;   TOOTH EXTRACTION Bilateral 12/28/2017   Procedure: DENTAL RESTORATION/EXTRACTIONS;  Surgeon: Ocie Doyne, DDS;  Location: [facility] OR;  Service: Oral Surgery;  Laterality:  Bilateral;    Social History: Social History   Socioeconomic History   Marital status: Legally Separated    Spouse name: Not on file   Number of children: Not on file   Years of education: Not on file   Highest education level: Not on file  Occupational History   Not on file  Tobacco Use   Smoking status: Every Day    Packs/day: 0.50    Years: 29.00    Additional pack years: 0.00    Total pack years: 14.50    Types: Cigarettes   Smokeless tobacco: Never   Tobacco comments:    Smokes 1/2 pack per day--09/20/20/ 3-4 cigs a day 06/01/22  Vaping Use   Vaping Use: Never used  Substance and Sexual Activity   Alcohol use: Not Currently   Drug use: No   Sexual activity: Yes  Other Topics Concern   Not on file  Social History Narrative   Are you right handed or left handed? left   Are you currently employed ?    What is your current occupation? disability   Do you live at home alone?yes   Who lives with you?    What type of home do you live in: 1 story or 2 story? one   Caffeine 1 soda a day    Social Determinants of Health   Financial Resource Strain: Not on file  Food Insecurity: No Food Insecurity (06/10/2022)   Hunger Vital Sign    Worried About Running Out of Food in the Last Year: Never true    Ran Out of Food in the Last Year: Never true  Transportation Needs: No Transportation Needs (06/10/2022)   PRAPARE - Administrator, Civil Service (Medical): No    Lack of Transportation (Non-Medical): No  Physical Activity: Not on file  Stress: Not on file  Social Connections: Not on file  Intimate Partner Violence: Not At Risk (06/10/2022)   Humiliation, Afraid, Rape, and Kick questionnaire    Fear of Current or Ex-Partner: No    Emotionally Abused: No    Physically Abused: No    Sexually Abused: No    Family History: Family History  Problem Relation Age of Onset   Colon cancer Brother        in his 24s   Heart failure Mother     Current  Medications:  Current Outpatient Medications:    albuterol (PROVENTIL) (2.5 MG/3ML) 0.083% nebulizer solution, Take 2.5 mg by nebulization every 6 (six) hours as needed., Disp: , Rfl:    albuterol (VENTOLIN HFA) 108 (90 Base) MCG/ACT inhaler, Inhale 2 puffs into the lungs every 6 (six) hours as needed for wheezing or shortness of breath., Disp: 8 g, Rfl: 5   amLODipine (NORVASC) 5 MG tablet, TAKE ONE TABLET BY MOUTH DAILY (MORNING), Disp: 30 tablet, Rfl: 6   ARIPiprazole (ABILIFY) 10 MG tablet, Take 10 mg by mouth daily., Disp: , Rfl:    aspirin EC 81 MG tablet, Take 1 tablet (81 mg total) by mouth daily. Swallow whole., Disp: 90 tablet,  Rfl: 3   Cholecalciferol 1.25 MG (50000 UT) capsule, Take 50,000 Units by mouth daily., Disp: , Rfl:    clopidogrel (PLAVIX) 75 MG tablet, Take 75 mg by mouth daily. Medication was on patient's list from [facility] and he stated he was taking., Disp: , Rfl:    cyclobenzaprine (FLEXERIL) 10 MG tablet, Take 1 tablet by mouth at bedtime as needed., Disp: , Rfl:    ezetimibe (ZETIA) 10 MG tablet, Take 1 tablet (10 mg total) by mouth daily., Disp: 90 tablet, Rfl: 0   FLUoxetine (PROZAC) 20 MG capsule, Take 20 mg by mouth every morning., Disp: , Rfl:    furosemide (LASIX) 20 MG tablet, Take 1 tablet (20 mg total) by mouth daily as needed., Disp: 90 tablet, Rfl: 3   hydrochlorothiazide (MICROZIDE) 12.5 MG capsule, Take 12.5 mg by mouth every morning., Disp: , Rfl:    meclizine (ANTIVERT) 25 MG tablet, Take 25 mg by mouth 3 (three) times daily. Medication on list from [facility] patient brought with him. He stated he was taking TID., Disp: , Rfl:    metformin (FORTAMET) 500 MG (OSM) 24 hr tablet, Take 500 mg by mouth 1 day or 1 dose. (Patient not taking: Reported on 07/13/2022), Disp: , Rfl:    methocarbamol (ROBAXIN) 750 MG tablet, Take 750 mg by mouth every 8 (eight) hours., Disp: , Rfl:    metoprolol succinate (TOPROL-XL) 25 MG 24 hr tablet, Take 1/2 tablet  (12.5 mg total) by mouth every morning., Disp: 90 tablet, Rfl: 3   Nebulizer MISC, machine, Disp: , Rfl:    nitroGLYCERIN (NITROSTAT) 0.4 MG SL tablet, Place 1 tablet (0.4 mg total) under the tongue every 5 (five) minutes as needed for chest pain., Disp: 25 tablet, Rfl: 1   oxyCODONE (OXY IR/ROXICODONE) 5 MG immediate release tablet, Take 10 mg by mouth as needed for severe pain. Takes 10mg , Disp: , Rfl:    pregabalin (LYRICA) 200 MG capsule, Take 200 mg by mouth in the morning, at noon, and at bedtime., Disp: , Rfl:    rosuvastatin (CRESTOR) 40 MG tablet, Take 1 tablet (40 mg total) by mouth at bedtime., Disp: 90 tablet, Rfl: 3   Study - EVOLVE-MI - evolocumab (REPATHA) 140 mg/mL SQ injection (PI-Stuckey), Inject 1 mL (140 mg total) into the skin every 14 (fourteen) days. For Investigational Use Only. Inject subcutaneously into abdomen, thigh, or upper arm every 14 days. Rotate injection sites and do not inject into areas where skin is tender, bruised, or red. Please contact [facility] for any questions or concerns regarding this medication., Disp: 12 mL, Rfl: 0   ticagrelor (BRILINTA) 90 MG TABS tablet, Take 1 tablet (90 mg total) by mouth 2 (two) times daily., Disp: 180 tablet, Rfl: 3   trazodone (DESYREL) 300 MG tablet, Take 300 mg by mouth at bedtime. (Patient not taking: Reported on 07/13/2022), Disp: , Rfl:    Vitamin D, Ergocalciferol, 50000 units CAPS, Take 1 capsule by mouth once a week., Disp: , Rfl:    zolpidem (AMBIEN) 10 MG tablet, Take 10 mg by mouth at bedtime as needed for sleep., Disp: , Rfl:    Allergies: Allergies  Allergen Reactions   Fentanyl Rash    Other Reaction(s): Dizziness   Methadone Other (See Comments)    REACTION: "Become mentally unstable"    REVIEW OF SYSTEMS:   Review of Systems  Constitutional:  Negative for chills, fatigue and fever.  HENT:   Negative for lump/mass, mouth sores,  nosebleeds, sore throat and trouble swallowing.   Eyes:   Negative for eye problems.  Respiratory:  Negative for cough and shortness of breath.   Cardiovascular:  Negative for chest pain, leg swelling and palpitations.  Gastrointestinal:  Negative for abdominal pain, constipation, diarrhea, nausea and vomiting.  Genitourinary:  Negative for bladder incontinence, difficulty urinating, dysuria, frequency, hematuria and nocturia.   Musculoskeletal:  Negative for arthralgias, back pain, flank pain, myalgias and neck pain.  Skin:  Negative for itching and rash.  Neurological:  Negative for dizziness, headaches and numbness.  Hematological:  Does not bruise/bleed easily.  Psychiatric/Behavioral:  Negative for depression, sleep disturbance and suicidal ideas. The patient is not nervous/anxious.   All other systems reviewed and are negative.    VITALS:   There were no vitals taken for this visit.  Wt Readings from Last 3 Encounters:  07/17/22 232 lb 2.3 oz (105.3 kg)  07/13/22 233 lb 11 oz (106 kg)  06/22/22 242 lb (109.8 kg)    There is no height or weight on file to calculate BMI.   PHYSICAL EXAM:   Physical Exam Vitals and nursing note reviewed. Exam conducted with a chaperone present.  Constitutional:      Appearance: Normal appearance.  Cardiovascular:     Rate and Rhythm: Normal rate and regular rhythm.     Pulses: Normal pulses.     Heart sounds: Normal heart sounds.  Pulmonary:     Effort: Pulmonary effort is normal.     Breath sounds: Normal breath sounds.  Abdominal:     Palpations: Abdomen is soft. There is no hepatomegaly, splenomegaly or mass.     Tenderness: There is no abdominal tenderness.  Musculoskeletal:     Right lower leg: No edema.     Left lower leg: No edema.  Lymphadenopathy:     Cervical: No cervical adenopathy.     Right cervical: No superficial, deep or posterior cervical adenopathy.    Left cervical: No superficial, deep or posterior cervical adenopathy.     Upper Body:     Right upper body: No  supraclavicular or axillary adenopathy.     Left upper body: No supraclavicular or axillary adenopathy.  Neurological:     General: No focal deficit present.     Mental Status: He is alert and oriented to person, place, and time.  Psychiatric:        Mood and Affect: Mood normal.        Behavior: Behavior normal.     LABS:      Latest Ref Rng & Units 06/12/2022    9:14 AM 06/11/2022    1:47 AM 06/10/2022    6:29 PM  CBC  WBC 4.0 - 10.5 K/uL 8.3  10.9    Hemoglobin 13.0 - 17.0 g/dL 19.1  47.8  29.5   Hematocrit 39.0 - 52.0 % 36.3  37.1  38.0   Platelets 150 - 400 K/uL 295  326        Latest Ref Rng & Units 06/13/2022    9:17 AM 06/12/2022    9:14 AM 06/11/2022    1:47 AM  CMP  Glucose 70 - 99 mg/dL 621  308  657   BUN 6 - 20 mg/dL 19  17  12    Creatinine 0.61 - 1.24 mg/dL 8.46  9.62  9.52   Sodium 135 - 145 mmol/L 138  136  137   Potassium 3.5 - 5.1 mmol/L 4.0  3.9  3.9   Chloride  98 - 111 mmol/L 110  108  109   CO2 22 - 32 mmol/L Calcium 8.9 - 10.3 mg/dL 9.2  9.0  8.5      No results found for: "CEA1", "CEA" / No results found for: "CEA1", "CEA" No results found for: "PSA1" No results found for: "ZOX096" No results found for: "CAN125"  No results found for: "TOTALPROTELP", "ALBUMINELP", "A1GS", "A2GS", "BETS", "BETA2SER", "GAMS", "MSPIKE", "SPEI" No results found for: "TIBC", "FERRITIN", "IRONPCTSAT" No results found for: "LDH"   STUDIES:   NCV with EMG(electromyography)  Result Date: 06/26/2022 Antony Madura, MD     06/26/2022 12:41 PM [facility] [address] [place], [address]  [place], [place] [address] Tel: [phone] Fax: [phone] Test Date:  06/26/2022 Patient: Imanol Bihl DOB: March 24, 1961 Physician: Jacquelyne Balint, MD Sex: Male Height:  Ref Phys: Jacquelyne Balint, MD ID#: [id number]   Technician:  History: This is a 62 year old male with numbness and tingling in legs and hands. NCV & EMG Findings: Extensive electrodiagnostic evaluation of the  right upper and lower limbs with additional nerve conduction studies of the left upper limb shows: Right sural, superficial peroneal, median, ulnar, and radial sensory responses are within normal limits. Bilateral ulnar (ADM) motor responses show reduced amplitude (L5.9, R6.1 mV). Right peroneal/fibular (EDB), tibial (AH), and median (APB) are within normal limits. Chronic motor axon loss changes without accompanying active denervation changes are seen in the right tibialis anterior and gluteus medius muscles. All other tested muscles are within normal limits with normal motor unit configuration and recruitment patterns. Impression: This is an abnormal study. The findings are most consistent with the following: The residuals of an old intraspinal canal lesion (ie: motor radiculopathy) at the right L5 root, mild in degree electrically. No electrodiagnostic evidence of a large fiber sensorimotor polyneuropathy. No electrodiagnostic evidence of a right cervical (C5-C8) motor radiculopathy. Reduced bilateral ulnar (ADM) motor responses are of unclear clinical significance given normal needle examination of ulnar innervated muscles and may be technical in nature. The findings are too limited in degree and distribution for definitive diagnosis. ___________________________ Jacquelyne Balint, MD Nerve Conduction Studies Motor Nerve Results   Latency Amplitude F-Lat Segment Distance CV Comment Site (ms) Norm (mV) Norm (ms)  (cm) (m/s) Norm  Right Fibular (EDB) Motor Ankle 4.5  < 6.0 2.6  > 2.5       Bel fib head 14.2 - 2.1 -  Bel fib head-Ankle 39 40  > 40  Pop fossa 16.2 - 2.0 -  Pop fossa-Bel fib head 8 40 -  Right Median (APB) Motor Wrist 2.9  < 4.0 9.7  > 6.0       Elbow 8.9 - 8.8 -  Elbow-Wrist 31 52  > 50  Right Tibial (AH) Motor Ankle 4.3  < 6.0 12.1  > 4.0       Knee 15.7 - 9.3 -  Knee-Ankle 49 43  > 40  Left Ulnar (ADM) Motor Wrist 2.2  < 3.1 *5.9  > 7.0       Bel elbow 7.4 - 5.8 -  Bel elbow-Wrist 26 50  > 50  Ab elbow  9.4 - 5.8 -  Ab elbow-Bel elbow 10 50 -  Right Ulnar (ADM) Motor Wrist 2.5  < 3.1 *6.1  > 7.0       Bel elbow 7.6 - 5.5 -  Bel elbow-Wrist 26 51  > 50  Ab elbow 9.6 -  5.3 -  Ab elbow-Bel elbow 10 50 -  Sensory Sites   Neg Peak Lat Amplitude (O-P) Segment Distance Velocity Comment Site (ms) Norm (V) Norm  (cm) (ms)  Right Median Sensory Wrist-Dig II 3.4  < 3.6 16  > 15 Wrist-Dig II 13   Right Radial Sensory Forearm-Wrist 2.4  < 2.7 15  > 14 Forearm-Wrist 10   Right Superficial Fibular Sensory 14 cm-Ankle 3.0  < 4.6 12  > 4 14 cm-Ankle 14   Right Sural Sensory Calf-Lat mall 4.0  < 4.6 5  > 4 Calf-Lat mall 14   Right Ulnar Sensory Wrist-Dig V 3.1  < 3.1 10  > 10 Wrist-Dig V 11   H-Reflex Results   M-Lat H Lat H Neg Amp H-M Lat Site (ms) (ms) Norm (mV) (ms) Right Tibial H-Reflex Pop fossa 8.1 ---  < 35.0 --- --- Electromyography  Side Muscle Ins.Act Fibs Fasc Recrt Amp Dur Poly Activation Comment Right Tib ant Nml Nml Nml *2- *1+ *1+ *1+ Nml N/A Right Gastroc MH Nml Nml Nml Nml Nml Nml Nml Nml N/A Right Rectus fem Nml Nml Nml Nml Nml Nml Nml Nml N/A Right Biceps fem SH Nml Nml Nml Nml Nml Nml Nml Nml N/A Right Gluteus med Nml Nml Nml *1- *1+ *1+ *1+ Nml N/A Right FDI Nml Nml Nml Nml Nml Nml Nml Nml N/A Right EIP Nml Nml Nml Nml Nml Nml Nml Nml N/A Right ADM Nml Nml Nml Nml Nml Nml Nml Nml N/A Right Pronator teres Nml Nml Nml Nml Nml Nml Nml Nml N/A Right Biceps Nml Nml Nml Nml Nml Nml Nml Nml N/A Right Triceps lat hd Nml Nml Nml Nml Nml Nml Nml Nml N/A Right Deltoid Nml Nml Nml Nml Nml Nml Nml Nml N/A Waveforms: Motor         Sensory         H-Reflex

## 2022-07-18 ENCOUNTER — Inpatient Hospital Stay: Payer: 59 | Attending: Hematology | Admitting: Hematology

## 2022-07-18 VITALS — BP 110/71 | HR 88 | Temp 98.5°F | Resp 18 | Ht 74.0 in | Wt 234.9 lb

## 2022-07-18 DIAGNOSIS — Z87891 Personal history of nicotine dependence: Secondary | ICD-10-CM | POA: Insufficient documentation

## 2022-07-18 DIAGNOSIS — I2582 Chronic total occlusion of coronary artery: Secondary | ICD-10-CM | POA: Diagnosis not present

## 2022-07-18 DIAGNOSIS — I2510 Atherosclerotic heart disease of native coronary artery without angina pectoris: Secondary | ICD-10-CM | POA: Diagnosis not present

## 2022-07-18 DIAGNOSIS — R634 Abnormal weight loss: Secondary | ICD-10-CM | POA: Insufficient documentation

## 2022-07-18 DIAGNOSIS — I8291 Chronic embolism and thrombosis of unspecified vein: Secondary | ICD-10-CM

## 2022-07-18 NOTE — Patient Instructions (Addendum)
[facility]  Discharge Instructions  You were seen and examined today by Dr. Ellin Saba. Dr. Ellin Saba is a hematologist, meaning that he specializes in blood abnormalities. Dr. Ellin Saba discussed your past medical history, family history of cancers/blood conditions and the events that led to you being here today.  You were referred to Dr. Ellin Saba due to possible blood clot activity.  Dr. Ellin Saba has recommended additional labs for further evaluation.  Follow-up as scheduled.    Thank you for choosing [facility] to provide your oncology and hematology care.   To afford each patient quality time with our provider, please arrive at least 15 minutes before your scheduled appointment time. You may need to reschedule your appointment if you arrive late (10 or more minutes). Arriving late affects you and other patients whose appointments are after yours.  Also, if you miss three or more appointments without notifying the office, you may be dismissed from the clinic at the provider's discretion.    Again, thank you for choosing [facility].  Our hope is that these requests will decrease the amount of time that you wait before being seen by our physicians.   If you have a lab appointment with the Cancer Center - please note that after April 8th, all labs will be drawn in the cancer center.  You do not have to check in or register with the main entrance as you have in the past but will complete your check-in at the cancer center.            _____________________________________________________________  Should you have questions after your visit to [facility], please contact our office at [phone] and follow the prompts.  Our office hours are 8:00 a.m. to 4:30 p.m. Monday - Thursday and 8:00 a.m. to 2:30 p.m. Friday.  Please note that voicemails left after 4:00 p.m. may not be returned until the following  business day.  We are closed weekends and all major holidays.  You do have access to a nurse 24-7, just call the main number to the clinic [phone] and do not press any options, hold on the line and a nurse will answer the phone.    For prescription refill requests, have your pharmacy contact our office and allow 72 hours.    Masks are no longer required in the cancer centers. If you would like for your care team to wear a mask while they are taking care of you, please let them know. You may have one support person who is at least 60 years old accompany you for your appointments.

## 2022-07-19 ENCOUNTER — Inpatient Hospital Stay: Payer: 59

## 2022-07-19 ENCOUNTER — Encounter (HOSPITAL_COMMUNITY)
Admission: RE | Admit: 2022-07-19 | Discharge: 2022-07-19 | Disposition: A | Discharge status: Home / Self Care | Payer: 59 | Source: Ambulatory Visit | Attending: Cardiology | Admitting: Cardiology

## 2022-07-19 DIAGNOSIS — I2510 Atherosclerotic heart disease of native coronary artery without angina pectoris: Secondary | ICD-10-CM | POA: Diagnosis not present

## 2022-07-19 DIAGNOSIS — I2111 ST elevation (STEMI) myocardial infarction involving right coronary artery: Secondary | ICD-10-CM

## 2022-07-19 DIAGNOSIS — I8291 Chronic embolism and thrombosis of unspecified vein: Secondary | ICD-10-CM

## 2022-07-19 LAB — D-DIMER, QUANTITATIVE: D-Dimer, Quant: 0.58 ug/mL-FEU — ABNORMAL HIGH (ref 0.00–0.50)

## 2022-07-19 LAB — GLUCOSE, CAPILLARY: Glucose-Capillary: 206 mg/dL — ABNORMAL HIGH (ref 70–99)

## 2022-07-19 NOTE — Progress Notes (Signed)
Daily Session Note  Patient Details  Name: Michael [name] MRN: [id number] Date of Birth: 1961-01-19 Referring Provider:   Flowsheet Row CARDIAC REHAB PHASE II ORIENTATION from 07/13/2022 in [facility] CARD[facility]AC [facility]ITATION  Referring Provider Dr. Okey Dupre       Encounter Date: 07/19/2022  Check In:  Session Check In - 07/19/22 1116       Check-In   Supervising physician immediately available to respond to emergencies [facility] MD immediately available    Physician(s) Dr Wyline Mood    Location [facility]    Staff Present Ross Ludwig, BS, Exercise Physiologist;Debra Laural Benes, RN, Pleas Koch, RN, BSN    Virtual Visit No    Medication changes reported     No    Fall or balance concerns reported    Yes    Comments Patient reports poor balance and history of 8 to 15 falls in past year.    Tobacco Cessation No Change    Warm-up and Cool-down Performed as group-led instruction    Resistance Training Performed Yes      Pain Assessment   Currently in Pain? No/denies    Pain Score 0-No pain    Multiple Pain Sites No             Capillary Blood Glucose: No results found for this or any previous visit (from the past 24 hour(s)).    Social History   Tobacco Use  Smoking Status Every Day   Packs/day: 0.50   Years: 29.00   Additional pack years: 0.00   Total pack years: 14.50   Types: Cigarettes  Smokeless Tobacco Never  Tobacco Comments   Smokes 1/2 pack per day--09/20/20/ 3-4 cigs a day 06/01/22    Goals Met:  Independence with exercise equipment Exercise tolerated well No report of concerns or symptoms today Strength training completed today  Goals Unmet:  Not Applicable  Comments: Checkout at 1200.   Dr. [name] is Medical Director for [facility]

## 2022-07-20 LAB — FACTOR 8 ASSAY: Coagulation Factor VIII: 269 % — ABNORMAL HIGH (ref 56–140)

## 2022-07-21 ENCOUNTER — Encounter (HOSPITAL_COMMUNITY): Payer: 59

## 2022-07-23 LAB — BETA-2-GLYCOPROTEIN I ABS, IGG/M/A
Beta-2 Glyco I IgG: <9 GPI IgG units (ref 0–20)
Beta-2-Glycoprotein I IgA: 30 GPI IgA units — ABNORMAL HIGH (ref 0–25)
Beta-2-Glycoprotein I IgM: 18 GPI IgM units (ref 0–32)

## 2022-07-24 ENCOUNTER — Encounter (HOSPITAL_COMMUNITY): Payer: 59

## 2022-07-24 NOTE — Progress Notes (Incomplete)
[facility] [address] [place] [address]   Clinic Day:  07/24/2022  Referring physician: Antoine Poche, MD  Patient Care Team: Antoine Poche, MD as PCP - General (Cardiology) Wyline Mood Dorothe Pea, MD as PCP - Cardiology (Cardiology) Jena Gauss Gerrit Friends, MD as Consulting Physician (Gastroenterology) Antony Madura, MD as Consulting Physician (Neurology)   ASSESSMENT & PLAN:   Assessment:  1.  Recurrent coronary embolism: - Cardiac catheterization (06/10/2022): Severe single-vessel CAD with occlusion of mid rPDA.  Appearance similar to distal LAD and LCx occlusion seen in 2020 and may reflect recurrent coronary embolism. - 2D echo (06/11/2022): LVEF 50%.  Low normal LV function.  LV demonstrates RWMA.  Mild asymmetric left ventricular hypertrophy of the septal segment. - ATIII-92%, protein C activity 122%, protein S activity 84%, lupus anticoagulant-negative, antibeta 2 glycoprotein 1 antibody normal (IgA borderline high), homocysteine-26.6 (0-14 0.5), factor V Leiden and PT gene mutation negative, anticardiolipin antibodies negative - No prior history of DVT/PE/CVA.  2.  Social/family history: - He lives by himself at home and is independent of ADLs and IADLs.  He is currently disabled.  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 - Currently smokes 4 cigarettes/day.  Started smoking at age 68 and smoked less than 1 pack/day on average, but smoked 5 packs/day for 3 years. - No family history of recurrent thromboembolism. - Brother, mother had metastatic lung cancer.  Another brother, father and sister had kidney cancer.  Another sister had cancer, patient does not know if it is lung primary or kidney.  Plan:  1.  Recurrent coronary embolism: - I have reviewed results of hypercoagulable workup which were negative. - He did not take aspirin for the last 5 days as he is worried about possible damage to the lining of his stomach.  He  reports weight loss of 7 pounds in the last 6 months. - Antibeta 2 glycoprotein 1 IgA level was borderline high but not significant. - Recommend checking Factor VIII level, JAK2 V617F, D-dimer to complete the workup.  Will also repeat antibeta 2 glycoprotein antibody. - RTC 1 week for follow-up.   No orders of the defined types were placed in this encounter.    I,Alexis Herring,acting as a Neurosurgeon for [employer], MD.,have documented all relevant documentation on the behalf of Doreatha Massed, MD,as directed by  Doreatha Massed, MD while in the presence of Doreatha Massed, MD.  ***   Pura Spice   4/15/20246:03 PM  CHIEF COMPLAINT/PURPOSE OF CONSULT:   Diagnosis: Recurrent coronary embolism  Current Therapy: Under workup  HISTORY OF PRESENT ILLNESS:   Dontravious is a 62 y.o. male presenting to clinic today for evaluation of hypercoagulable state at the request of CVD.  He had cardiac catheterization which showed occlusion of the mid rPDA.  Appearance was similar to distal LAD and LCx occlusion seen in 2020 and was suspicious for recurrent coronary embolism.  He never had history of pulmonary embolism or DVT.  No prior history of CVA.  Today, he states that he is doing well overall. His appetite level is at 100%. His energy level is at 10%.  INTERVAL HISTORY:   TC KAPUSTA is a 62 y.o. male presenting to clinic today for follow up of hypercoagulable state with h/o recurrent coronary embolism. He was last seen by me on 07/18/22 for consult.  Today, he states that he is doing well overall. His appetite level is at ***%.  His energy level is at ***%.  PAST MEDICAL HISTORY:   Past Medical History: Past Medical History:  Diagnosis Date   Allergic rhinitis    Arthritis    Chronic low back pain    follwed in Pain Clinic(Dr.Kirchmayer)   CKD (chronic kidney disease), stage II    Depression    Depression with anxiety    GERD (gastroesophageal reflux disease)     History of kidney stones    passed   HLD (hyperlipidemia) 03/23/2019   Insomnia    Migraines    NSTEMI (non-ST elevated myocardial infarction)    x2   Sarcoidosis    Multisystem,pulmonary and hepatic (Dr.Rourk)Liver bx in 06 mildly postive AMA but on re-check normal   Sleep apnea    "tested, but said it wasnt bad enough for a machine".    Surgical History: Past Surgical History:  Procedure Laterality Date   BACK SURGERY  2012   2nd back surgery 3 monthes ago and in 2007   CARDIAC CATHETERIZATION  05/20/2019   COLONOSCOPY  11/05/2003   JSE:[id number] internal hemorrhoids, otherwise normal rectum and colon   CORONARY/GRAFT ACUTE MI REVASCULARIZATION N/A 03/20/2019   Procedure: Coronary/Graft Acute MI Revascularization;  Surgeon: Lennette Bihari, MD;  Location: [facility] INVASIVE CV LAB;  Service: Cardiovascular;  Laterality: N/A;   CORONARY/GRAFT ACUTE MI REVASCULARIZATION N/A 06/10/2022   Procedure: Coronary/Graft Acute MI Revascularization;  Surgeon: Yvonne Kendall, MD;  Location: [facility] INVASIVE CV LAB;  Service: Cardiovascular;  Laterality: N/A;   ESOPHAGOGASTRODUODENOSCOPY  03/22/2004   HYW:VPXTGG esophagogastroduodenoscopy. Small hiatal hernia otherwise normal stomach   HARVEST BONE GRAFT Left    thigh   HIP ARTHROPLASTY Bilateral    left 2017, right 2016   HIP SURGERY Left 2017   bone graft   LAMINECTOMY  2009   Balateral L4-L5   LEFT HEART CATH AND CORONARY ANGIOGRAPHY N/A 03/20/2019   Procedure: LEFT HEART CATH AND CORONARY ANGIOGRAPHY;  Surgeon: Lennette Bihari, MD;  Location: [facility] INVASIVE CV LAB;  Service: Cardiovascular;  Laterality: N/A;   LEFT HEART CATH AND CORONARY ANGIOGRAPHY N/A 05/20/2019   Procedure: LEFT HEART CATH AND CORONARY ANGIOGRAPHY;  Surgeon: Marykay Lex, MD;  Location: [facility] INVASIVE CV LAB;  Service: Cardiovascular;  Laterality: N/A;   LEFT HEART CATH AND CORONARY ANGIOGRAPHY N/A 06/10/2022   Procedure: LEFT HEART CATH AND CORONARY ANGIOGRAPHY;  Surgeon: Yvonne Kendall, MD;  Location: [facility] INVASIVE CV LAB;  Service: Cardiovascular;  Laterality: N/A;   RESECTION DISTAL CLAVICAL Right 01/23/2019   Procedure: OPEN DISTAL CLAVICLE EXCISION;  Surgeon: Vickki Hearing, MD;  Location: [facility] ORS;  Service: Orthopedics;  Laterality: Right;   TOOTH EXTRACTION Bilateral 12/28/2017   Procedure: DENTAL RESTORATION/EXTRACTIONS;  Surgeon: Ocie Doyne, DDS;  Location: [facility];  Service: Oral Surgery;  Laterality: Bilateral;    Social History: Social History   Socioeconomic History   Marital status: Legally Separated    Spouse name: Not on file   Number of children: Not on file   Years of education: Not on file   Highest education level: Not on file  Occupational History   Not on file  Tobacco Use   Smoking status: Every Day    Packs/day: 0.50    Years: 29.00    Additional pack years: 0.00    Total pack years: 14.50    Types: Cigarettes   Smokeless tobacco: Never   Tobacco comments:    Smokes 1/2 pack per day--09/20/20/ 3-4 cigs a day 06/01/22  Vaping Use  Vaping Use: Never used  Substance and Sexual Activity   Alcohol use: Not Currently   Drug use: No   Sexual activity: Yes  Other Topics Concern   Not on file  Social History Narrative   Are you right handed or left handed? left   Are you currently employed ?    What is your current occupation? disability   Do you live at home alone?yes   Who lives with you?    What type of home do you live in: 1 story or 2 story? one   Caffeine 1 soda a day    Social Determinants of Health   Financial Resource Strain: Not on file  Food Insecurity: No Food Insecurity (06/10/2022)   Hunger Vital Sign    Worried About Running Out of Food in the Last Year: Never true    Ran Out of Food in the Last Year: Never true  Transportation Needs: No Transportation Needs (06/10/2022)   PRAPARE - Administrator, Civil Service (Medical): No    Lack of Transportation (Non-Medical): No  Physical Activity: Not  on file  Stress: Not on file  Social Connections: Not on file  Intimate Partner Violence: Not At Risk (06/10/2022)   Humiliation, Afraid, Rape, and Kick questionnaire    Fear of Current or Ex-Partner: No    Emotionally Abused: No    Physically Abused: No    Sexually Abused: No    Family History: Family History  Problem Relation Age of Onset   Colon cancer Brother        in his 57s   Heart failure Mother     Current Medications:  Current Outpatient Medications:    albuterol (PROVENTIL) (2.5 MG/3ML) 0.083% nebulizer solution, Take 2.5 mg by nebulization every 6 (six) hours as needed., Disp: , Rfl:    albuterol (VENTOLIN HFA) 108 (90 Base) MCG/ACT inhaler, Inhale 2 puffs into the lungs every 6 (six) hours as needed for wheezing or shortness of breath., Disp: 8 g, Rfl: 5   amLODipine (NORVASC) 5 MG tablet, TAKE ONE TABLET BY MOUTH DAILY (MORNING), Disp: 30 tablet, Rfl: 6   ARIPiprazole (ABILIFY) 10 MG tablet, Take 10 mg by mouth daily., Disp: , Rfl:    aspirin EC 81 MG tablet, Take 1 tablet (81 mg total) by mouth daily. Swallow whole., Disp: 90 tablet, Rfl: 3   Cholecalciferol 1.25 MG (50000 UT) capsule, Take 50,000 Units by mouth daily., Disp: , Rfl:    clopidogrel (PLAVIX) 75 MG tablet, Take 75 mg by mouth daily. Medication was on patient's list from [facility] and he stated he was taking., Disp: , Rfl:    cyclobenzaprine (FLEXERIL) 10 MG tablet, Take 1 tablet by mouth at bedtime as needed., Disp: , Rfl:    ezetimibe (ZETIA) 10 MG tablet, Take 1 tablet (10 mg total) by mouth daily., Disp: 90 tablet, Rfl: 0   FLUoxetine (PROZAC) 20 MG capsule, Take 20 mg by mouth every morning., Disp: , Rfl:    furosemide (LASIX) 20 MG tablet, Take 1 tablet (20 mg total) by mouth daily as needed., Disp: 90 tablet, Rfl: 3   hydrochlorothiazide (MICROZIDE) 12.5 MG capsule, Take 12.5 mg by mouth every morning., Disp: , Rfl:    meclizine (ANTIVERT) 25 MG tablet, Take 25 mg by mouth 3 (three) times  daily. Medication on list from [facility] patient brought with him. He stated he was taking TID., Disp: , Rfl:    metformin (FORTAMET) 500 MG (OSM) 24  hr tablet, Take 500 mg by mouth 1 day or 1 dose., Disp: , Rfl:    methocarbamol (ROBAXIN) 750 MG tablet, Take 750 mg by mouth every 8 (eight) hours., Disp: , Rfl:    metoprolol succinate (TOPROL-XL) 25 MG 24 hr tablet, Take 1/2 tablet (12.5 mg total) by mouth every morning., Disp: 90 tablet, Rfl: 3   Nebulizer MISC, machine, Disp: , Rfl:    nitroGLYCERIN (NITROSTAT) 0.4 MG SL tablet, Place 1 tablet (0.4 mg total) under the tongue every 5 (five) minutes as needed for chest pain., Disp: 25 tablet, Rfl: 1   oxyCODONE (OXY IR/ROXICODONE) 5 MG immediate release tablet, Take 10 mg by mouth as needed for severe pain. Takes , Disp: , Rfl:    pregabalin (LYRICA) 200 MG capsule, Take 200 mg by mouth in the morning, at noon, and at bedtime., Disp: , Rfl:    rosuvastatin (CRESTOR) 40 MG tablet, Take 1 tablet (40 mg total) by mouth at bedtime., Disp: 90 tablet, Rfl: 3   Study - EVOLVE-MI - evolocumab (REPATHA) 140 mg/mL SQ injection (PI-Stuckey), Inject 1 mL (140 mg total) into the skin every 14 (fourteen) days. For Investigational Use Only. Inject subcutaneously into abdomen, thigh, or upper arm every 14 days. Rotate injection sites and do not inject into areas where skin is tender, bruised, or red. Please contact [facility] for any questions or concerns regarding this medication., Disp: 12 mL, Rfl: 0   ticagrelor (BRILINTA) 90 MG TABS tablet, Take 1 tablet (90 mg total) by mouth 2 (two) times daily., Disp: 180 tablet, Rfl: 3   trazodone (DESYREL) 300 MG tablet, Take 300 mg by mouth at bedtime., Disp: , Rfl:    Vitamin D, Ergocalciferol, 50000 units CAPS, Take 1 capsule by mouth once a week., Disp: , Rfl:    zolpidem (AMBIEN) 10 MG tablet, Take 10 mg by mouth at bedtime as needed for sleep., Disp: , Rfl:    Allergies: Allergies  Allergen  Reactions   Fentanyl Rash    Other Reaction(s): Dizziness   Methadone Other (See Comments)    REACTION: "Become mentally unstable"    REVIEW OF SYSTEMS:   Review of Systems  Constitutional:  Negative for chills, fatigue and fever.  HENT:   Negative for lump/mass, mouth sores, nosebleeds, sore throat and trouble swallowing.   Eyes:  Negative for eye problems.  Respiratory:  Negative for cough and shortness of breath.   Cardiovascular:  Negative for chest pain, leg swelling and palpitations.  Gastrointestinal:  Negative for abdominal pain, constipation, diarrhea, nausea and vomiting.  Genitourinary:  Negative for bladder incontinence, difficulty urinating, dysuria, frequency, hematuria and nocturia.   Musculoskeletal:  Negative for arthralgias, back pain, flank pain, myalgias and neck pain.  Skin:  Negative for itching and rash.  Neurological:  Negative for dizziness, headaches and numbness.  Hematological:  Does not bruise/bleed easily.  Psychiatric/Behavioral:  Negative for depression, sleep disturbance and suicidal ideas. The patient is not nervous/anxious.   All other systems reviewed and are negative.    VITALS:   There were no vitals taken for this visit.  Wt Readings from Last 3 Encounters:  07/18/22 234 lb 14.4 oz (106.5 kg)  07/17/22 232 lb 2.3 oz (105.3 kg)  07/13/22 233 lb 11 oz (106 kg)    There is no height or weight on file to calculate BMI.   PHYSICAL EXAM:   Physical Exam Vitals and nursing note reviewed. Exam conducted with a chaperone present.  Constitutional:  Appearance: Normal appearance.  Cardiovascular:     Rate and Rhythm: Normal rate and regular rhythm.     Pulses: Normal pulses.     Heart sounds: Normal heart sounds.  Pulmonary:     Effort: Pulmonary effort is normal.     Breath sounds: Normal breath sounds.  Abdominal:     Palpations: Abdomen is soft. There is no hepatomegaly, splenomegaly or mass.     Tenderness: There is no abdominal  tenderness.  Musculoskeletal:     Right lower leg: No edema.     Left lower leg: No edema.  Lymphadenopathy:     Cervical: No cervical adenopathy.     Right cervical: No superficial, deep or posterior cervical adenopathy.    Left cervical: No superficial, deep or posterior cervical adenopathy.     Upper Body:     Right upper body: No supraclavicular or axillary adenopathy.     Left upper body: No supraclavicular or axillary adenopathy.  Neurological:     General: No focal deficit present.     Mental Status: He is alert and oriented to person, place, and time.  Psychiatric:        Mood and Affect: Mood normal.        Behavior: Behavior normal.     LABS:      Latest Ref Rng & Units 06/12/2022    9:14 AM 06/11/2022    1:47 AM 06/10/2022    6:29 PM  CBC  WBC 4.0 - 10.5 K/uL 8.3  10.9    Hemoglobin 13.0 - 17.0 g/dL 40.9  81.1  91.4   Hematocrit 39.0 - 52.0 % 36.3  37.1  38.0   Platelets 150 - 400 K/uL 295  326        Latest Ref Rng & Units 06/13/2022    9:17 AM 06/12/2022    9:14 AM 06/11/2022    1:47 AM  CMP  Glucose 70 - 99 mg/dL 782  956  213   BUN 6 - 20 mg/dL 19  17  12    Creatinine 0.61 - 1.24 mg/dL 0.86  5.78  4.69   Sodium 135 - 145 mmol/L 138  136  137   Potassium 3.5 - 5.1 mmol/L 4.0  3.9  3.9   Chloride 98 - 111 mmol/L 110  108  109   CO2 22 - 32 mmol/L 20  28  20    Calcium 8.9 - 10.3 mg/dL 9.2  9.0  8.5      No results found for: "CEA1", "CEA" / No results found for: "CEA1", "CEA" No results found for: "PSA1" No results found for: "GEX528" No results found for: "CAN125"  No results found for: "TOTALPROTELP", "ALBUMINELP", "A1GS", "A2GS", "BETS", "BETA2SER", "GAMS", "MSPIKE", "SPEI" No results found for: "TIBC", "FERRITIN", "IRONPCTSAT" No results found for: "LDH"   STUDIES:   NCV with EMG(electromyography)  Result Date: 06/26/2022 Antony Madura, MD     06/26/2022 12:41 PM [facility] [address] [place], [address]  [place], [place] [address] Tel: [phone] Fax: [phone] Test Date:  06/26/2022 Patient: Lenny Fiumara DOB: [birth year] Physician: Jacquelyne Balint, MD Sex: Male Height: 6\' 2"  Ref Phys: Jacquelyne Balint, MD ID#: [id number]   Technician:  History: This is a 62 year old male with numbness and tingling in legs and hands. NCV & EMG Findings: Extensive electrodiagnostic evaluation of the right upper and lower limbs with additional nerve conduction studies of the left upper limb shows: Right sural, superficial peroneal, median, ulnar,  and radial sensory responses are within normal limits. Bilateral ulnar (ADM) motor responses show reduced amplitude (L5.9, R6.1 mV). Right peroneal/fibular (EDB), tibial (AH), and median (APB) are within normal limits. Chronic motor axon loss changes without accompanying active denervation changes are seen in the right tibialis anterior and gluteus medius muscles. All other tested muscles are within normal limits with normal motor unit configuration and recruitment patterns. Impression: This is an abnormal study. The findings are most consistent with the following: The residuals of an old intraspinal canal lesion (ie: motor radiculopathy) at the right L5 root, mild in degree electrically. No electrodiagnostic evidence of a large fiber sensorimotor polyneuropathy. No electrodiagnostic evidence of a right cervical (C5-C8) motor radiculopathy. Reduced bilateral ulnar (ADM) motor responses are of unclear clinical significance given normal needle examination of ulnar innervated muscles and may be technical in nature. The findings are too limited in degree and distribution for definitive diagnosis. ___________________________ Jacquelyne Balint, MD Nerve Conduction Studies Motor Nerve Results   Latency Amplitude F-Lat Segment Distance CV Comment Site (ms) Norm (mV) Norm (ms)  (cm) (m/s) Norm  Right Fibular (EDB) Motor Ankle 4.5  < 6.0 2.6  > 2.5       Bel fib head 14.2 - 2.1 -  Bel fib head-Ankle 39 40  > 40  Pop fossa 16.2 - 2.0 -  Pop fossa-Bel  fib head 8 40 -  Right Median (APB) Motor Wrist 2.9  < 4.0 9.7  > 6.0       Elbow 8.9 - 8.8 -  Elbow-Wrist 31 52  > 50  Right Tibial (AH) Motor Ankle 4.3  < 6.0 12.1  > 4.0       Knee 15.7 - 9.3 -  Knee-Ankle 49 43  > 40  Left Ulnar (ADM) Motor Wrist 2.2  < 3.1 *5.9  > 7.0       Bel elbow 7.4 - 5.8 -  Bel elbow-Wrist 26 50  > 50  Ab elbow 9.4 - 5.8 -  Ab elbow-Bel elbow 10 50 -  Right Ulnar (ADM) Motor Wrist 2.5  < 3.1 *6.1  > 7.0       Bel elbow 7.6 - 5.5 -  Bel elbow-Wrist 26 51  > 50  Ab elbow 9.6 - 5.3 -  Ab elbow-Bel elbow 10 50 -  Sensory Sites   Neg Peak Lat Amplitude (O-P) Segment Distance Velocity Comment Site (ms) Norm (V) Norm  (cm) (ms)  Right Median Sensory Wrist-Dig II 3.4  < 3.6 16  > 15 Wrist-Dig II 13   Right Radial Sensory Forearm-Wrist 2.4  < 2.7 15  > 14 Forearm-Wrist 10   Right Superficial Fibular Sensory 14 cm-Ankle 3.0  < 4.6 12  > 4 14 cm-Ankle 14   Right Sural Sensory Calf-Lat mall 4.0  < 4.6 5  > 4 Calf-Lat mall 14   Right Ulnar Sensory Wrist-Dig V 3.1  < 3.1 10  > 10 Wrist-Dig V 11   H-Reflex Results   M-Lat H Lat H Neg Amp H-M Lat Site (ms) (ms) Norm (mV) (ms) Right Tibial H-Reflex Pop fossa 8.1 ---  < 35.0 --- --- Electromyography  Side Muscle Ins.Act Fibs Fasc Recrt Amp Dur Poly Activation Comment Right Tib ant Nml Nml Nml *2- *1+ *1+ *1+ Nml N/A Right Gastroc MH Nml Nml Nml Nml Nml Nml Nml Nml N/A Right Rectus fem Nml Nml Nml Nml Nml Nml Nml Nml N/A Right Biceps fem SH Nml Nml Nml Nml Nml Nml  Nml Nml N/A Right Gluteus med Nml Nml Nml *1- *1+ *1+ *1+ Nml N/A Right FDI Nml Nml Nml Nml Nml Nml Nml Nml N/A Right EIP Nml Nml Nml Nml Nml Nml Nml Nml N/A Right ADM Nml Nml Nml Nml Nml Nml Nml Nml N/A Right Pronator teres Nml Nml Nml Nml Nml Nml Nml Nml N/A Right Biceps Nml Nml Nml Nml Nml Nml Nml Nml N/A Right Triceps lat hd Nml Nml Nml Nml Nml Nml Nml Nml N/A Right Deltoid Nml Nml Nml Nml Nml Nml Nml Nml N/A Waveforms: Motor         Sensory         H-Reflex

## 2022-07-25 ENCOUNTER — Inpatient Hospital Stay: Payer: 59 | Admitting: Hematology

## 2022-07-26 ENCOUNTER — Encounter (HOSPITAL_COMMUNITY): Payer: 59

## 2022-07-27 LAB — JAK2 GENOTYPR

## 2022-07-28 ENCOUNTER — Encounter (HOSPITAL_COMMUNITY): Payer: 59

## 2022-07-31 ENCOUNTER — Encounter (HOSPITAL_COMMUNITY): Payer: 59

## 2022-08-02 ENCOUNTER — Encounter (HOSPITAL_COMMUNITY): Payer: 59

## 2022-08-02 NOTE — Progress Notes (Signed)
Cardiac Individual Treatment Plan  Patient Details  Name: Michael [name] MRN: [id number] Date of Birth: June 12, 1960 Referring Provider:   Flowsheet Row CARDIAC REHAB PHASE II ORIENTATION from 07/13/2022 in [facility] CARDIAC REHABILITATION  Referring Provider Dr. Okey Dupre       Initial Encounter Date:  Flowsheet Row CARDIAC REHAB PHASE II ORIENTATION from 07/13/2022 in [facility] [place] CARDIAC REHABILITATION  Date 07/13/22       Visit Diagnosis: ST elevation myocardial infarction involving right coronary artery  Patient's Home Medications on Admission:  Current Outpatient Medications:    albuterol (PROVENTIL) (2.5 MG/3ML) 0.083% nebulizer solution, Take 2.5 mg by nebulization every 6 (six) hours as needed., Disp: , Rfl:    albuterol (VENTOLIN HFA) 108 (90 Base) MCG/ACT inhaler, Inhale 2 puffs into the lungs every 6 (six) hours as needed for wheezing or shortness of breath., Disp: 8 g, Rfl: 5   amLODipine (NORVASC) 5 MG tablet, TAKE ONE TABLET BY MOUTH DAILY (MORNING), Disp: 30 tablet, Rfl: 6   ARIPiprazole (ABILIFY) 10 MG tablet, Take 10 mg by mouth daily., Disp: , Rfl:    aspirin EC 81 MG tablet, Take 1 tablet (81 mg total) by mouth daily. Swallow whole., Disp: 90 tablet, Rfl: 3   Cholecalciferol 1.25 MG (50000 UT) capsule, Take 50,000 Units by mouth daily., Disp: , Rfl:    clopidogrel (PLAVIX) 75 MG tablet, Take 75 mg by mouth daily. Medication was on patient's list from [facility] and he stated he was taking., Disp: , Rfl:    cyclobenzaprine (FLEXERIL) 10 MG tablet, Take 1 tablet by mouth at bedtime as needed., Disp: , Rfl:    ezetimibe (ZETIA) 10 MG tablet, Take 1 tablet (10 mg total) by mouth daily., Disp: 90 tablet, Rfl: 0   FLUoxetine (PROZAC) 20 MG capsule, Take 20 mg by mouth every morning., Disp: , Rfl:    furosemide (LASIX) 20 MG tablet, Take 1 tablet (20 mg total) by mouth daily as needed., Disp: 90 tablet, Rfl: 3   hydrochlorothiazide (MICROZIDE) 12.5 MG capsule, Take 12.5 mg by  mouth every morning., Disp: , Rfl:    meclizine (ANTIVERT) 25 MG tablet, Take 25 mg by mouth 3 (three) times daily. Medication on list from [facility] patient brought with him. He stated he was taking TID., Disp: , Rfl:    metformin (FORTAMET) 500 MG (OSM) 24 hr tablet, Take 500 mg by mouth 1 day or 1 dose., Disp: , Rfl:    methocarbamol (ROBAXIN) 750 MG tablet, Take 750 mg by mouth every 8 (eight) hours., Disp: , Rfl:    metoprolol succinate (TOPROL-XL) 25 MG 24 hr tablet, Take 1/2 tablet (12.5 mg total) by mouth every morning., Disp: 90 tablet, Rfl: 3   Nebulizer MISC, machine, Disp: , Rfl:    nitroGLYCERIN (NITROSTAT) 0.4 MG SL tablet, Place 1 tablet (0.4 mg total) under the tongue every 5 (five) minutes as needed for chest pain., Disp: 25 tablet, Rfl: 1   oxyCODONE (OXY IR/ROXICODONE) 5 MG immediate release tablet, Take 10 mg by mouth as needed for severe pain. Takes , Disp: , Rfl:    pregabalin (LYRICA) 200 MG capsule, Take 200 mg by mouth in the morning, at noon, and at bedtime., Disp: , Rfl:    rosuvastatin (CRESTOR) 40 MG tablet, Take 1 tablet (40 mg total) by mouth at bedtime., Disp: 90 tablet, Rfl: 3   Study - EVOLVE-MI - evolocumab (REPATHA) 140 mg/mL SQ injection (PI-Stuckey), Inject 1 mL (140 mg total) into the skin  every 14 (fourteen) days. For Investigational Use Only. Inject subcutaneously into abdomen, thigh, or upper arm every 14 days. Rotate injection sites and do not inject into areas where skin is tender, bruised, or red. Please contact [facility] for any questions or concerns regarding this medication., Disp: 12 mL, Rfl: 0   ticagrelor (BRILINTA) 90 MG TABS tablet, Take 1 tablet (90 mg total) by mouth 2 (two) times daily., Disp: 180 tablet, Rfl: 3   trazodone (DESYREL) 300 MG tablet, Take 300 mg by mouth at bedtime., Disp: , Rfl:    Vitamin D, Ergocalciferol, 50000 units CAPS, Take 1 capsule by mouth once a week., Disp: , Rfl:    zolpidem (AMBIEN) 10 MG  tablet, Take 10 mg by mouth at bedtime as needed for sleep., Disp: , Rfl:   Past Medical History: Past Medical History:  Diagnosis Date   Allergic rhinitis    Arthritis    Chronic low back pain    follwed in [facility](Dr.Kirchmayer)   CKD (chronic kidney disease), stage II    Depression    Depression with anxiety    GERD (gastroesophageal reflux disease)    History of kidney stones    passed   HLD (hyperlipidemia) 03/23/2019   Insomnia    Migraines    NSTEMI (non-ST elevated myocardial infarction)    x2   Sarcoidosis    Multisystem,pulmonary and hepatic (Dr.Rourk)Liver bx in 06 mildly postive AMA but on re-check normal   Sleep apnea    "tested, but said it wasnt bad enough for a machine".    Tobacco Use: Social History   Tobacco Use  Smoking Status Every Day   Packs/day: 0.50   Years: 29.00   Additional pack years: 0.00   Total pack years: 14.50   Types: Cigarettes  Smokeless Tobacco Never  Tobacco Comments   Smokes 1/2 pack per day--09/20/20/ 3-4 cigs a day 06/01/22    Labs: Review Flowsheet       Latest Ref Rng & Units 03/20/2019 05/20/2019 06/01/2022 06/10/2022  Labs for ITP Cardiac and Pulmonary Rehab  Cholestrol 0 - 200 mg/dL 161  096  045  - 409   LDL (calc) 0 - 99 mg/dL 811  97  914  - 782   HDL-C >40 mg/dL 48  48  47  - 36   Trlycerides <150 mg/dL 56  956  213  - 086   Hemoglobin A1c 4.8 - 5.6 % 6.2  - 6.3  6.1   TCO2 22 - 32 mmol/L 24  - - 20     Capillary Blood Glucose: Lab Results  Component Value Date   GLUCAP 206 (H) 07/19/2022   GLUCAP 194 (H) 07/17/2022   GLUCAP 118 (H) 06/13/2022   GLUCAP 81 06/13/2022   GLUCAP 95 06/12/2022     Exercise Target Goals: Exercise Program Goal: Individual exercise prescription set using results from initial 6 min walk test and THRR while considering  patient's activity barriers and safety.   Exercise Prescription Goal: Starting with aerobic activity 30 plus minutes a day, 3 days per week for initial  exercise prescription. Provide home exercise prescription and guidelines that participant acknowledges understanding prior to discharge.  Activity Barriers & Risk Stratification:  Activity Barriers & Cardiac Risk Stratification - 07/13/22 0808       Activity Barriers & Cardiac Risk Stratification   Activity Barriers Arthritis;Left Hip Replacement;Right Hip Replacement;Joint Problems;Deconditioning;Shortness of Breath;Back Problems;Chest Pain/Angina;Balance Concerns    Cardiac Risk Stratification High  6 Minute Walk:  6 Minute Walk     Row Name 07/13/22 0919         6 Minute Walk   Phase Initial     Distance 1200 feet     Walk Time 6 minutes     # of Rest Breaks 0     MPH 2.27     METS 3.37     RPE 11     VO2 Peak 11.82     Symptoms Yes (comment)     Comments B hip pain (6/10)     Resting HR 98 bpm     Resting BP 90/70     Resting Oxygen Saturation  99 %     Exercise Oxygen Saturation  during 6 min walk 98 %     Max Ex. HR 125 bpm     Max Ex. BP 118/72     2 Minute Post BP 110/70              Oxygen Initial Assessment:   Oxygen Re-Evaluation:   Oxygen Discharge (Final Oxygen Re-Evaluation):   Initial Exercise Prescription:  Initial Exercise Prescription - 07/13/22 0900       Date of Initial Exercise RX and Referring Provider   Date 07/13/22    Referring Provider Dr. Okey Dupre    Expected Discharge Date 10/04/22      Treadmill   MPH 1    Grade 0    Minutes 22      NuStep   Level 1    SPM 65    Minutes 17      Prescription Details   Frequency (times per week) 3    Duration Progress to 30 minutes of continuous aerobic without signs/symptoms of physical distress      Intensity   THRR 40-80% of Max Heartrate 64-129    Ratings of Perceived Exertion 11-13      Resistance Training   Training Prescription Yes    Weight 4    Reps 10-15             Perform Capillary Blood Glucose checks as needed.  Exercise Prescription  Changes:   Exercise Prescription Changes     Row Name 07/17/22 1300 07/19/22 1300           Response to Exercise   Blood Pressure (Admit) 132/70 94/70      Blood Pressure (Exercise) 126/64 122/72      Blood Pressure (Exit) 102/62 104/56      Heart Rate (Admit) 81 bpm 90 bpm      Heart Rate (Exercise) 114 bpm 119 bpm      Heart Rate (Exit) 77 bpm 99 bpm      Rating of Perceived Exertion (Exercise) 15 11      Duration Continue with 30 min of aerobic exercise without signs/symptoms of physical distress. Continue with 30 min of aerobic exercise without signs/symptoms of physical distress.      Intensity THRR unchanged THRR unchanged        Progression   Progression Continue to progress workloads to maintain intensity without signs/symptoms of physical distress. Continue to progress workloads to maintain intensity without signs/symptoms of physical distress.        Resistance Training   Training Prescription Yes Yes      Weight 5 5      Reps 10-15 10-15      Time 10 Minutes 10 Minutes        Treadmill   MPH 1 --  Grade 0 --      Minutes 17 --      METs 1.77 --        NuStep   Level 1 2      SPM 93 91      Minutes 22 39      METs 2.32 2.18               Exercise Comments:   Exercise Goals and Review:   Exercise Goals     Row Name 07/13/22 1610 07/31/22 1443           Exercise Goals   Increase Physical Activity Yes Yes      Intervention Provide advice, education, support and counseling about physical activity/exercise needs.;Develop an individualized exercise prescription for aerobic and resistive training based on initial evaluation findings, risk stratification, comorbidities and participant's personal goals. Provide advice, education, support and counseling about physical activity/exercise needs.;Develop an individualized exercise prescription for aerobic and resistive training based on initial evaluation findings, risk stratification, comorbidities and  participant's personal goals.      Expected Outcomes Long Term: Add in home exercise to make exercise part of routine and to increase amount of physical activity.;Short Term: Attend rehab on a regular basis to increase amount of physical activity.;Long Term: Exercising regularly at least 3-5 days a week. Long Term: Add in home exercise to make exercise part of routine and to increase amount of physical activity.;Short Term: Attend rehab on a regular basis to increase amount of physical activity.;Long Term: Exercising regularly at least 3-5 days a week.      Increase Strength and Stamina Yes Yes      Intervention Provide advice, education, support and counseling about physical activity/exercise needs.;Develop an individualized exercise prescription for aerobic and resistive training based on initial evaluation findings, risk stratification, comorbidities and participant's personal goals. Provide advice, education, support and counseling about physical activity/exercise needs.;Develop an individualized exercise prescription for aerobic and resistive training based on initial evaluation findings, risk stratification, comorbidities and participant's personal goals.      Expected Outcomes Short Term: Increase workloads from initial exercise prescription for resistance, speed, and METs.;Short Term: Perform resistance training exercises routinely during rehab and add in resistance training at home;Long Term: Improve cardiorespiratory fitness, muscular endurance and strength as measured by increased METs and functional capacity ( ) Short Term: Increase workloads from initial exercise prescription for resistance, speed, and METs.;Short Term: Perform resistance training exercises routinely during rehab and add in resistance training at home;Long Term: Improve cardiorespiratory fitness, muscular endurance and strength as measured by increased METs and functional capacity ( )      Able to understand and use rate of  perceived exertion (RPE) scale Yes Yes      Intervention Provide education and explanation on how to use RPE scale Provide education and explanation on how to use RPE scale      Expected Outcomes Short Term: Able to use RPE daily in rehab to express subjective intensity level;Long Term:  Able to use RPE to guide intensity level when exercising independently Short Term: Able to use RPE daily in rehab to express subjective intensity level;Long Term:  Able to use RPE to guide intensity level when exercising independently      Knowledge and understanding of Target Heart Rate Range (THRR) Yes Yes      Intervention Provide education and explanation of THRR including how the numbers were predicted and where they are located for reference Provide education and explanation of THRR including how  the numbers were predicted and where they are located for reference      Expected Outcomes Short Term: Able to state/look up THRR;Long Term: Able to use THRR to govern intensity when exercising independently;Short Term: Able to use daily as guideline for intensity in rehab Short Term: Able to state/look up THRR;Long Term: Able to use THRR to govern intensity when exercising independently;Short Term: Able to use daily as guideline for intensity in rehab      Able to check pulse independently Yes Yes      Intervention Provide education and demonstration on how to check pulse in carotid and radial arteries.;Review the importance of being able to check your own pulse for safety during independent exercise Provide education and demonstration on how to check pulse in carotid and radial arteries.;Review the importance of being able to check your own pulse for safety during independent exercise      Expected Outcomes Short Term: Able to explain why pulse checking is important during independent exercise;Long Term: Able to check pulse independently and accurately Short Term: Able to explain why pulse checking is important during  independent exercise;Long Term: Able to check pulse independently and accurately      Understanding of Exercise Prescription Yes Yes      Intervention Provide education, explanation, and written materials on patient's individual exercise prescription Provide education, explanation, and written materials on patient's individual exercise prescription      Expected Outcomes Short Term: Able to explain program exercise prescription;Long Term: Able to explain home exercise prescription to exercise independently Short Term: Able to explain program exercise prescription;Long Term: Able to explain home exercise prescription to exercise independently               Exercise Goals Re-Evaluation :  Exercise Goals Re-Evaluation     Row Name 07/31/22 1443             Exercise Goal Re-Evaluation   Exercise Goals Review Increase Physical Activity;Increase Strength and Stamina;Able to understand and use rate of perceived exertion (RPE) scale;Knowledge and understanding of Target Heart Rate Range (THRR);Understanding of Exercise Prescription;Able to check pulse independently       Comments Pt ahs attended 3 session of caridac rehab. He has not came to class in over a week. We have called and was not able to leave a voicemail. He was currently exercising at 2.18 METs on the stepper. Will update when he comes back to class       Expected Outcomes Through exercise at rehab and home, the patient will meet their stated goals.                 Discharge Exercise Prescription (Final Exercise Prescription Changes):  Exercise Prescription Changes - 07/19/22 1300       Response to Exercise   Blood Pressure (Admit) 94/70    Blood Pressure (Exercise) 122/72    Blood Pressure (Exit) 104/56    Heart Rate (Admit) 90 bpm    Heart Rate (Exercise) 119 bpm    Heart Rate (Exit) 99 bpm    Rating of Perceived Exertion (Exercise) 11    Duration Continue with 30 min of aerobic exercise without signs/symptoms of  physical distress.    Intensity THRR unchanged      Progression   Progression Continue to progress workloads to maintain intensity without signs/symptoms of physical distress.      Resistance Training   Training Prescription Yes    Weight 5    Reps 10-15  Time 10 Minutes      NuStep   Level 2    SPM 91    Minutes 39    METs 2.18             Nutrition:  Target Goals: Understanding of nutrition guidelines, daily intake of sodium 1500mg , cholesterol 200mg , calories 30% from fat and 7% or less from saturated fats, daily to have 5 or more servings of fruits and vegetables.  Biometrics:  Pre Biometrics - 07/13/22 0922       Pre Biometrics   Height 6\' 2"  (1.88 m)    Weight 106 kg    Waist Circumference 42 inches    Hip Circumference 42 inches    Waist to Hip Ratio 1 %    BMI (Calculated) 29.99    Triceps Skinfold 20 mm    % Body Fat 29.6 %    Grip Strength 35.2 kg    Flexibility 0 in    Single Leg Stand 0 seconds              Nutrition Therapy Plan and Nutrition Goals:  Nutrition Therapy & Goals - 07/13/22 0918       Personal Nutrition Goals   Comments Patient scored 39 on his diet assessment. He trys to follow a low sodium diet. Handout provided and explained on heart heatlhier choices and RD referral offered. Patient declinced. We offer educational sessions on heart healthy nutrition with handouts.      Intervention Plan   Intervention Nutrition handout(s) given to patient.    Expected Outcomes Short Term Goal: Understand basic principles of dietary content, such as calories, fat, sodium, cholesterol and nutrients.             Nutrition Assessments:  Nutrition Assessments - 07/13/22 0918       MEDFICTS Scores   Pre Score 39            MEDIFICTS Score Key: ?70 Need to make dietary changes  40-70 Heart Healthy Diet ? 40 Therapeutic Level Cholesterol Diet   Picture Your Plate Scores: <32 Unhealthy dietary pattern with much room for  improvement. 41-50 Dietary pattern unlikely to meet recommendations for good health and room for improvement. 51-60 More healthful dietary pattern, with some room for improvement.  >60 Healthy dietary pattern, although there may be some specific behaviors that could be improved.    Nutrition Goals Re-Evaluation:   Nutrition Goals Discharge (Final Nutrition Goals Re-Evaluation):   Psychosocial: Target Goals: Acknowledge presence or absence of significant depression and/or stress, maximize coping skills, provide positive support system. Participant is able to verbalize types and ability to use techniques and skills needed for reducing stress and depression.  Initial Review & Psychosocial Screening:  Initial Psych Review & Screening - 07/13/22 0953       Initial Review   Current issues with Current Depression;Current Anxiety/Panic;History of Depression;Current Psychotropic Meds      Family Dynamics   Good Support System? Yes      Barriers   Psychosocial barriers to participate in program The patient should benefit from training in stress management and relaxation.      Screening Interventions   Interventions Encouraged to exercise    Expected Outcomes Short Term goal: Utilizing psychosocial counselor, staff and physician to assist with identification of specific Stressors or current issues interfering with healing process. Setting desired goal for each stressor or current issue identified.;Short Term goal: Identification and review with participant of any Quality of Life or Depression concerns  found by scoring the questionnaire.             Quality of Life Scores:  Quality of Life - 07/13/22 0922       Quality of Life   Select Quality of Life      Quality of Life Scores   Health/Function Pre 17.8 %    Socioeconomic Pre 19.9 %    Psych/Spiritual Pre 22.07 %    Family Pre 24 %    GLOBAL Pre 19.77 %            Scores of 19 and below usually indicate a poorer quality  of life in these areas.  A difference of  2-3 points is a clinically meaningful difference.  A difference of 2-3 points in the total score of the Quality of Life Index has been associated with significant improvement in overall quality of life, self-image, physical symptoms, and general health in studies assessing change in quality of life.  PHQ-9: Review Flowsheet       07/13/2022 05/09/2018  Depression screen PHQ 2/9  Decreased Interest 1 0  Down, Depressed, Hopeless 1 0  PHQ - 2 Score 2 0  Altered sleeping 1 -  Tired, decreased energy 1 -  Change in appetite 0 -  Feeling bad or failure about yourself  0 -  Trouble concentrating 1 -  Moving slowly or fidgety/restless 1 -  Suicidal thoughts 0 -  PHQ-9 Score 6 -  Difficult doing work/chores Somewhat difficult -   Interpretation of Total Score  Total Score Depression Severity:  1-4 = Minimal depression, 5-9 = Mild depression, 10-14 = Moderate depression, 15-19 = Moderately severe depression, 20-27 = Severe depression   Psychosocial Evaluation and Intervention:  Psychosocial Evaluation - 07/13/22 1006       Psychosocial Evaluation & Interventions   Interventions Stress management education;Relaxation education;Encouraged to exercise with the program and follow exercise prescription    Comments Patient scored 6 on his PHQ-9. He says his chronic pain may be a barrier to participate in the program but no psychosocial barriers identified. He has a long history of depression and anxiety which is being managed with fluoxetine and Abilify. He says he feels his depression and anxiety are managed with the medication. He lives alone but reports having 2 daughers age 34 and 21. He says he is currently going through a divorce and he signed documentation yesterday. He says custody of the children has not been decided. He names several brothers as his support people. He says he has 30 siblings living all over the [place]. He did not provide any detailed  answers. We says he does want to try and see if he can do the program and hopes his pain is not going to be an issue.    Expected Outcomes Patient's depression and anxiety will continue to be managed with medications and he will continue to have no psychosocial barriers identified.    Continue Psychosocial Services  No Follow up required             Psychosocial Re-Evaluation:  Psychosocial Re-Evaluation     Row Name 07/24/22 [phone]             Psychosocial Re-Evaluation   Current issues with History of Depression;Current Anxiety/Panic;Current Psychotropic Meds;Current Depression       Comments Patient is new to the program. He has completed 2 sessions. He did have pain and cramps in his left arm when walking on the treadmill but was able  to exercise on the Nustep without difficulty. His depression and anxiety and PTSD continue to be managed with Fluoxetine and Abilify and his he sleeps well with Ambien. We will continue to monitor his progress.       Expected Outcomes Patient's anxiety, depression and PTSD will continue to be managed with medication and his pain will be managed and will not be a barrier to exercise.       Interventions Stress management education;Relaxation education;Encouraged to attend Cardiac Rehabilitation for the exercise       Continue Psychosocial Services  No Follow up required                Psychosocial Discharge (Final Psychosocial Re-Evaluation):  Psychosocial Re-Evaluation - 07/24/22 1610       Psychosocial Re-Evaluation   Current issues with History of Depression;Current Anxiety/Panic;Current Psychotropic Meds;Current Depression    Comments Patient is new to the program. He has completed 2 sessions. He did have pain and cramps in his left arm when walking on the treadmill but was able to exercise on the Nustep without difficulty. His depression and anxiety and PTSD continue to be managed with Fluoxetine and Abilify and his he sleeps well with  Ambien. We will continue to monitor his progress.    Expected Outcomes Patient's anxiety, depression and PTSD will continue to be managed with medication and his pain will be managed and will not be a barrier to exercise.    Interventions Stress management education;Relaxation education;Encouraged to attend Cardiac Rehabilitation for the exercise    Continue Psychosocial Services  No Follow up required             Vocational Rehabilitation: Provide vocational rehab assistance to qualifying candidates.   Vocational Rehab Evaluation & Intervention:  Vocational Rehab - 07/13/22 9604       Initial Vocational Rehab Evaluation & Intervention   Assessment shows need for Vocational Rehabilitation No      Vocational Rehab Re-Evaulation   Comments Patient is disabled and does not need vocational rehab.             Education: Education Goals: Education classes will be provided on a weekly basis, covering required topics. Participant will state understanding/return demonstration of topics presented.  Learning Barriers/Preferences:  Learning Barriers/Preferences - 07/13/22 0920       Learning Barriers/Preferences   Learning Barriers None             Education Topics: Hypertension, Hypertension Reduction -Define heart disease and high blood pressure. Discus how high blood pressure affects the body and ways to reduce high blood pressure. Flowsheet Row CARDIAC REHAB PHASE II EXERCISE from 07/19/2022 in [place] C[facility]  Date 07/19/22  Educator DJ  Instruction Review Code 2- Demonstrated Understanding       Exercise and Your Heart -Discuss why it is important to exercise, the FITT principles of exercise, normal and abnormal responses to exercise, and how to exercise safely.   Angina -Discuss definition of angina, causes of angina, treatment of angina, and how to decrease risk of having angina.   Cardiac Medications -Review what the following cardiac  medications are used for, how they affect the body, and side effects that may occur when taking the medications.  Medications include Aspirin, Beta blockers, calcium channel blockers, ACE Inhibitors, angiotensin receptor blockers, diuretics, digoxin, and antihyperlipidemics.   Congestive Heart Failure -Discuss the definition of CHF, how to live with CHF, the signs and symptoms of CHF, and how keep track of weight and  sodium intake.   Heart Disease and Intimacy -Discus the effect sexual activity has on the heart, how changes occur during intimacy as we age, and safety during sexual activity.   Smoking Cessation / COPD -Discuss different methods to quit smoking, the health benefits of quitting smoking, and the definition of COPD.   Nutrition I: Fats -Discuss the types of cholesterol, what cholesterol does to the heart, and how cholesterol levels can be controlled.   Nutrition II: Labels -Discuss the different components of food labels and how to read food label   Heart Parts/Heart Disease and PAD -Discuss the anatomy of the heart, the pathway of blood circulation through the heart, and these are affected by heart disease.   Stress I: Signs and Symptoms -Discuss the causes of stress, how stress may lead to anxiety and depression, and ways to limit stress.   Stress II: Relaxation -Discuss different types of relaxation techniques to limit stress.   Warning Signs of Stroke / TIA -Discuss definition of a stroke, what the signs and symptoms are of a stroke, and how to identify when someone is having stroke.   Knowledge Questionnaire Score:  Knowledge Questionnaire Score - 07/13/22 0920       Knowledge Questionnaire Score   Pre Score 20/24             Core Components/Risk Factors/Patient Goals at Admission:  Personal Goals and Risk Factors at Admission - 07/13/22 0921       Core Components/Risk Factors/Patient Goals on Admission    Weight Management Yes     Intervention Weight Management/Obesity: Establish reasonable short term and long term weight goals.;Obesity: Provide education and appropriate resources to help participant work on and attain dietary goals.    Admit Weight 233 lb 14.4 oz (106.1 kg)    Goal Weight: Long Term 223 lb (101.2 kg)    Tobacco Cessation Yes    Number of packs per day 3    Intervention Offer self-teaching materials, assist with locating and accessing local/national Quit Smoking programs, and support quit date choice.;Assist the participant in steps to quit. Provide individualized education and counseling about committing to Tobacco Cessation, relapse prevention, and pharmacological support that can be provided by physician.    Expected Outcomes Long Term: Complete abstinence from all tobacco products for at least 12 months from quit date.;Short Term: Will quit all tobacco product use, adhering to prevention of relapse plan.    Improve shortness of breath with ADL's Yes    Intervention Provide education, individualized exercise plan and daily activity instruction to help decrease symptoms of SOB with activities of daily living.    Expected Outcomes Short Term: Improve cardiorespiratory fitness to achieve a reduction of symptoms when performing ADLs;Long Term: Be able to perform more ADLs without symptoms or delay the onset of symptoms    Hypertension Yes    Intervention Provide education on lifestyle modifcations including regular physical activity/exercise, weight management, moderate sodium restriction and increased consumption of fresh fruit, vegetables, and low fat dairy, alcohol moderation, and smoking cessation.;Monitor prescription use compliance.    Expected Outcomes Short Term: Continued assessment and intervention until BP is < 140/93mm HG in hypertensive participants. < 130/2mm HG in hypertensive participants with diabetes, heart failure or chronic kidney disease.;Long Term: Maintenance of blood pressure at goal  levels.    Lipids Yes    Intervention Provide education and support for participant on nutrition & aerobic/resistive exercise along with prescribed medications to achieve LDL 70mg , HDL >40mg .  Expected Outcomes Short Term: Participant states understanding of desired cholesterol values and is compliant with medications prescribed. Participant is following exercise prescription and nutrition guidelines.;Long Term: Cholesterol controlled with medications as prescribed, with individualized exercise RX and with personalized nutrition plan. Value goals: LDL < 70mg , HDL > 40 mg.    Personal Goal Other Yes    Personal Goal Patient wants to lose weight and breathe better.    Intervention Patient will attend CR 3 days/week with exercise and education.    Expected Outcomes Patient will complete the program meeting both personal and program goals.             Core Components/Risk Factors/Patient Goals Review:   Goals and Risk Factor Review     Row Name 07/24/22 0831             Core Components/Risk Factors/Patient Goals Review   Personal Goals Review Weight Management/Obesity;Lipids;Hypertension;Improve shortness of breath with ADL's;Tobacco Cessation;Other       Review Patient was referred to CR with STEMI. He has multiple risk factors for CAD and is participating in the program for risk modification. He has completed 2 sessions with his current weight at 232.3 lbs losing 0.7 lbs since his initial visit. His blood pressure is well controlled. He saw cardiology 4/9 for thrombus monitoring. No changes made. His personal goals for the program are to lose weight and breathe better. We will continue to monitor his progress as he works towards meeting these goals.       Expected Outcomes Patient will complete the program meeting both personal and program goals.                Core Components/Risk Factors/Patient Goals at Discharge (Final Review):   Goals and Risk Factor Review - 07/24/22 0831        Core Components/Risk Factors/Patient Goals Review   Personal Goals Review Weight Management/Obesity;Lipids;Hypertension;Improve shortness of breath with ADL's;Tobacco Cessation;Other    Review Patient was referred to CR with STEMI. He has multiple risk factors for CAD and is participating in the program for risk modification. He has completed 2 sessions with his current weight at 232.3 lbs losing 0.7 lbs since his initial visit. His blood pressure is well controlled. He saw cardiology 4/9 for thrombus monitoring. No changes made. His personal goals for the program are to lose weight and breathe better. We will continue to monitor his progress as he works towards meeting these goals.    Expected Outcomes Patient will complete the program meeting both personal and program goals.             ITP Comments:   Comments: ITP REVIEW Pt is making expected progress toward Cardiac Rehab goals after completing 3 sessions. Recommend continued exercise, life style modification, education, and increased stamina and strength.

## 2022-08-03 ENCOUNTER — Other Ambulatory Visit: Payer: Self-pay | Admitting: Cardiology

## 2022-08-04 ENCOUNTER — Encounter (HOSPITAL_COMMUNITY): Payer: 59

## 2022-08-04 ENCOUNTER — Encounter: Payer: Self-pay | Admitting: Student

## 2022-08-04 ENCOUNTER — Ambulatory Visit: Payer: 59 | Attending: Student | Admitting: Student

## 2022-08-04 VITALS — BP 132/60 | HR 85 | Ht 74.0 in | Wt 236.8 lb

## 2022-08-04 DIAGNOSIS — I5032 Chronic diastolic (congestive) heart failure: Secondary | ICD-10-CM | POA: Diagnosis not present

## 2022-08-04 DIAGNOSIS — N1831 Chronic kidney disease, stage 3a: Secondary | ICD-10-CM

## 2022-08-04 DIAGNOSIS — I10 Essential (primary) hypertension: Secondary | ICD-10-CM | POA: Diagnosis not present

## 2022-08-04 DIAGNOSIS — R519 Headache, unspecified: Secondary | ICD-10-CM

## 2022-08-04 DIAGNOSIS — E785 Hyperlipidemia, unspecified: Secondary | ICD-10-CM

## 2022-08-04 DIAGNOSIS — I6529 Occlusion and stenosis of unspecified carotid artery: Secondary | ICD-10-CM

## 2022-08-04 DIAGNOSIS — R42 Dizziness and giddiness: Secondary | ICD-10-CM

## 2022-08-04 DIAGNOSIS — I2510 Atherosclerotic heart disease of native coronary artery without angina pectoris: Secondary | ICD-10-CM

## 2022-08-04 NOTE — Patient Instructions (Signed)
Medication Instructions:  Your physician recommends that you continue on your current medications as directed. Please refer to the Current Medication list given to you today.   Labwork: None  Testing/Procedures: Brain MRI Your physician has requested that you have a carotid duplex. This test is an ultrasound of the carotid arteries in your neck. It looks at blood flow through these arteries that supply the brain with blood. Allow one hour for this exam. There are no restrictions or special instructions.    Follow-Up: Follow up with Dr. Wyline Mood in 3 months.   Any Other Special Instructions Will Be Listed Below (If Applicable).     If you need a refill on your cardiac medications before your next appointment, please call your pharmacy.

## 2022-08-04 NOTE — Progress Notes (Signed)
Cardiology Office Note    Date:  08/04/2022  ID:  ANURAG SCARFO, DOB 05-Jul-1960, MRN [id number] Cardiologist: [name], MD    History of Present Illness:    Michael [name] is a 62 y.o. male with past medical history of CAD (s/p STEMI in 03/2019 with occluded distal LAD and OM1 and spontaneous reperfusion and no PCI, s/p Inferior STEMI in 06/2022 with mid-PDA occlusion and unsuccessful PTCA), HFimpEF (EF 25-30% in 2021, at 50% by echo in 06/2022), sarcoidosis, HTN, HLD, Type 2 DM and prior tobacco use who presents to the office today for 6-week follow-up.   He was last examined by Charlsie Quest, NP in 06/2022 following his recent STEMI and denied any recent chest pain or dyspnea on exertion at that time. He reported still having episodes of dizziness and left arm pain/numbness which had been occurring intermittently for the past year and did not improve with HCTZ being discontinued during his recent admission.  He was continued on DAPT with ASA and Brilinta and was referred to Hematology for further hypercoagulable workup. In regards to his cardiomyopathy, he was continued on Toprol-XL and was not on an ACE-I/ARB/ARNI/SGLT2 inhibitor given his variable renal function. Had previously been enrolled in the EVOLVE study and was on Repatha as well.   He did call the office in the interim reporting whole body cramps and was it was felt this was possibly due to his statin, therefore Crestor was reduced to 20 mg daily.  In talking with the patient today, his main issue continues to be dizziness. This can occur while at rest or with activity and is not associated with positional changes. He does utilize Meclizine but does not experience improvement in symptoms. Reports his dizziness was occurring prior to his MI but symptoms have significantly progressed since. Reports occasional episodes of chest discomfort but nothing resembling his recent event. No specific orthopnea, PND or pitting edema. He is listed  as being on both Plavix and Brilinta but we confirmed with his pharmacy today that he is only taking Brilinta.  Studies Reviewed:   EKG: EKG is not ordered today.   Cardiac Catheterization: 06/2022 Conclusions: Severe single vessel coronary artery disease with occlusion of mid rPDA.  Appearance is similar to distal LAD and LCx occlusions seen in 2020 and may reflect recurrent coronary embolism.  Mild RCA stenosis is similar to prior catheterization in 2021. Mildly elevated left ventricular filling pressure (LVEDP 20 mmHg). Attempted PTCA to rPDA occlusion; flow could not be reestablished despite multiple inflations with a 2.0 x 12 mm balloon.   Recommendations: Tirofiban infusion for 18 hours. Titrate nitroglycerin infusion for relief of chest pain. Dual antiplatelet therapy with aspirin and ticagrelor for at least 12 months. Obtain echocardiogram. Consider hypercoagulable workup and TEE/ILR to assess for potential causes of recurrent coronary occlusions suspicious for emboli. Aggressive secondary prevention of coronary artery disease.  Echocardiogram: 06/2022 IMPRESSIONS     1. Left ventricular ejection fraction, by estimation, is 50%. The left  ventricle has low normal function. The left ventricle demonstrates  regional wall motion abnormalities (see scoring diagram/findings for  description). There is mild asymmetric left  ventricular hypertrophy of the septal segment. Left ventricular diastolic  parameters were grossly normal.   2. Right ventricular systolic function is normal. The right ventricular  size is normal. Tricuspid regurgitation signal is inadequate for assessing  PA pressure.   3. The mitral valve is normal in structure. Trivial mitral valve  regurgitation. No evidence of mitral  stenosis.   4. The aortic valve is tricuspid. There is mild thickening of the aortic  valve. Aortic valve regurgitation is not visualized. No aortic stenosis is  present.   5. The  inferior vena cava is normal in size with greater than 50%  respiratory variability, suggesting right atrial pressure of 3 mmHg.    Physical Exam:   VS:  BP 132/60   Pulse 85   Ht 6\' 2"  (1.88 m)   Wt 236 lb 12.8 oz (107.4 kg)   SpO2 98%   BMI 30.40 kg/m    Wt Readings from Last 3 Encounters:  08/04/22 236 lb 12.8 oz (107.4 kg)  07/18/22 234 lb 14.4 oz (106.5 kg)  07/17/22 232 lb 2.3 oz (105.3 kg)     GEN: Well nourished, well developed male appearing in no acute distress NECK: No JVD; No carotid bruits CARDIAC: RRR, no murmurs, rubs, gallops RESPIRATORY:  Clear to auscultation without rales, wheezing or rhonchi  ABDOMEN: Appears non-distended. No obvious abdominal masses. EXTREMITIES: No clubbing or cyanosis. No pitting edema.  Distal pedal pulses are 2+ bilaterally.   Assessment and Plan:   1. CAD - He is s/p STEMI in 03/2019 with occluded distal LAD and OM1 and spontaneous reperfusion and no PCI with recent Inferior STEMI in 06/2022 with mid-PDA occlusion and unsuccessful PTCA. - He denies any recent chest pain resembling his prior angina. He is undergoing hypercoagulable workup by Hematology. Pending follow-up of his Brain MRI and carotid dopplers, could consider further workup with a TEE and possible ILR given his concern for embolic events as discussed during his hospitalization. Previously wore a monitor and reports this was not covered well by his insurance, therefore he is not interested in a cardiac monitor at this time.  - Continue current medical therapy with ASA 81 mg daily, Brilinta 90 twice daily, Zetia 10 mg daily, Crestor 40 mg daily, Toprol-XL 12.5 mg daily and Repatha.  2. HFimpEF  - His EF was at 25-30% in 2021, at 50% by echo in 06/2022. He appears euvolemic by examination today. He remains on Toprol-XL 12.5 mg daily and as needed Lasix. Confirmed with the patient that he is no longer on HCTZ as this was discontinued. He has not been on an ACE-I/ARB/ARNI/SGLT2  given his variable renal function but could consider an SGLT2 inhibitor in the future if he has issues with fluid retention.   3. HTN - His BP is well-controlled at 132/60 during today's visit. Continue Amlodipine 5 mg daily and Toprol-XL 12.5 mg daily.  4. HLD - He is currently enrolled in the EVOLVE study and is on Repatha while also receiving Zetia and Crestor. LPa was elevated to 233 during his admission.   5. Stage 3 CKD - Creatinine has been variable from 1.4 - 1.9 by recent labs. Was stable at 1.64 on 06/13/2022.  6. Headaches/Dizziness - Has been occurring for years but symptoms have progressed since his MI. I am concerned for a possible old CVA given his history of embolic events. Head CT in 04/2022 showed no acute intracranial abnormalities. Will order a Brain MRI and carotid dopplers. Pending testing, would have a low-threshold to refer to Neurology. If evidence of a prior CVA, would consider a TEE +/- ILR placement as discussed during his prior admission. Also undergoing hypercoagulable workup by Hematology.   Signed, Ellsworth Lennox, PA-C

## 2022-08-07 ENCOUNTER — Encounter (HOSPITAL_COMMUNITY)
Admission: RE | Admit: 2022-08-07 | Discharge: 2022-08-07 | Disposition: A | Discharge status: Home / Self Care | Payer: 59 | Source: Ambulatory Visit | Attending: Cardiology | Admitting: Cardiology

## 2022-08-07 DIAGNOSIS — I2510 Atherosclerotic heart disease of native coronary artery without angina pectoris: Secondary | ICD-10-CM | POA: Diagnosis not present

## 2022-08-07 DIAGNOSIS — I2111 ST elevation (STEMI) myocardial infarction involving right coronary artery: Secondary | ICD-10-CM

## 2022-08-07 NOTE — Progress Notes (Signed)
Daily Session Note  Patient Details  Name: Michael [name] MRN: [id number] Date of Birth: 1960-08-22 Referring Provider:   Flowsheet Row CARDIAC REHAB PHASE II ORIENTATION from 07/13/2022 in [facility] C[facility]AC RE[facility]IT[facility]  Referring Provider Dr. Okey Dupre       Encounter Date: 08/07/2022  Check In:  Session Check In - 08/07/22 1100       Check-In   Staff Present Daphyne Daphine Deutscher, RN, Neal Dy, RN, BSN    Virtual Visit No    Medication changes reported     No    Fall or balance concerns reported    Yes    Comments Patient reports poor balance and history of 8 to 15 falls in past year.    Tobacco Cessation Use Increase    Current number of cigarettes/nicotine per day     4    Warm-up and Cool-down Performed as group-led instruction    Resistance Training Performed Yes    VAD Patient? No    PAD/SET Patient? No      Pain Assessment   Currently in Pain? No/denies    Pain Score 0-No pain    Multiple Pain Sites No             Capillary Blood Glucose: No results found for this or any previous visit (from the past 24 hour(s)).    Social History   Tobacco Use  Smoking Status Every Day   Packs/day: 0.50   Years: 29.00   Additional pack years: 0.00   Total pack years: 14.50   Types: Cigarettes  Smokeless Tobacco Never  Tobacco Comments   Smokes 1/2 pack per day--09/20/20/ 3-4 cigs a day 06/01/22    Goals Met:  Independence with exercise equipment Exercise tolerated well No report of concerns or symptoms today Strength training completed today  Goals Unmet:  Not Applicable  Comments: Check out 1200.   Dr. [name] is Medical Director for [facility] Card[facility]

## 2022-08-09 ENCOUNTER — Encounter (HOSPITAL_COMMUNITY): Payer: 59

## 2022-08-11 ENCOUNTER — Encounter (HOSPITAL_COMMUNITY)
Admission: RE | Admit: 2022-08-11 | Discharge: 2022-08-11 | Disposition: A | Discharge status: Home / Self Care | Payer: 59 | Source: Ambulatory Visit | Attending: Cardiology | Admitting: Cardiology

## 2022-08-11 DIAGNOSIS — I2111 ST elevation (STEMI) myocardial infarction involving right coronary artery: Secondary | ICD-10-CM | POA: Insufficient documentation

## 2022-08-14 ENCOUNTER — Encounter (HOSPITAL_COMMUNITY)
Admission: RE | Admit: 2022-08-14 | Discharge: 2022-08-14 | Disposition: A | Discharge status: Home / Self Care | Payer: 59 | Source: Ambulatory Visit | Attending: Cardiology | Admitting: Cardiology

## 2022-08-14 VITALS — Wt 241.0 lb

## 2022-08-14 DIAGNOSIS — I2111 ST elevation (STEMI) myocardial infarction involving right coronary artery: Secondary | ICD-10-CM | POA: Diagnosis not present

## 2022-08-14 NOTE — Progress Notes (Signed)
Daily Session Note  Patient Details  Name: Michael [name] MRN: [id number] Date of Birth: 13-Jun-1960 Referring Provider:   Flowsheet Row CARDIAC REHAB PHASE II ORIENTATION from 07/13/2022 in [facility] CARDIAC REHABILITATION  Referring Provider Dr. Okey Dupre       Encounter Date: 08/14/2022  Check In:  Session Check In - 08/14/22 1100       Check-In   Supervising physician immediately available to respond to emergencies [facility] MD immediately available    Physician(s) Dr. Tenny Craw    Location [facility]    Staff Present Ross Ludwig, BS, Exercise Physiologist;Lien Lyman BSN, RN;Debra Laural Benes, RN, BSN    Virtual Visit No    Medication changes reported     No    Fall or balance concerns reported    Yes    Comments Patient reports poor balance and history of 8 to 15 falls in past year.    Tobacco Cessation No Change    Warm-up and Cool-down Performed as group-led instruction    Resistance Training Performed Yes    VAD Patient? No    PAD/SET Patient? No      Pain Assessment   Currently in Pain? Yes    Pain Score 8     Pain Location Other (Comment)   pain has generalized pain all over   Pain Descriptors / Indicators Aching    Pain Type Chronic pain    Pain Onset More than a month ago    Pain Frequency Constant    Pain Relieving Factors Oxycodone    Multiple Pain Sites No             Capillary Blood Glucose: No results found for this or any previous visit (from the past 24 hour(s)).    Social History   Tobacco Use  Smoking Status Every Day   Packs/day: 0.50   Years: 29.00   Additional pack years: 0.00   Total pack years: 14.50   Types: Cigarettes  Smokeless Tobacco Never  Tobacco Comments   Smokes 1/2 pack per day--09/20/20/ 3-4 cigs a day 06/01/22    Goals Met:  Independence with exercise equipment Exercise tolerated well No report of concerns or symptoms today Strength training completed today  Goals Unmet:  Not Applicable  Comments: check  out at 12:00   Dr. [name] is Medical Director for [facility]

## 2022-08-16 ENCOUNTER — Encounter (HOSPITAL_COMMUNITY): Payer: 59

## 2022-08-17 ENCOUNTER — Ambulatory Visit (HOSPITAL_COMMUNITY)
Admission: RE | Admit: 2022-08-17 | Discharge: 2022-08-17 | Disposition: A | Discharge status: Home / Self Care | Payer: 59 | Source: Ambulatory Visit | Attending: Student | Admitting: Student

## 2022-08-17 DIAGNOSIS — I6529 Occlusion and stenosis of unspecified carotid artery: Secondary | ICD-10-CM

## 2022-08-18 ENCOUNTER — Encounter (HOSPITAL_COMMUNITY): Payer: 59

## 2022-08-21 ENCOUNTER — Encounter (HOSPITAL_COMMUNITY): Payer: 59

## 2022-08-23 ENCOUNTER — Encounter (HOSPITAL_COMMUNITY): Payer: 59

## 2022-08-25 ENCOUNTER — Encounter (HOSPITAL_COMMUNITY): Payer: 59

## 2022-08-28 ENCOUNTER — Encounter (HOSPITAL_COMMUNITY): Payer: 59

## 2022-08-29 ENCOUNTER — Ambulatory Visit (HOSPITAL_COMMUNITY)
Admission: RE | Admit: 2022-08-29 | Discharge: 2022-08-29 | Disposition: A | Discharge status: Home / Self Care | Payer: 59 | Source: Ambulatory Visit | Attending: Student | Admitting: Student

## 2022-08-29 DIAGNOSIS — R42 Dizziness and giddiness: Secondary | ICD-10-CM | POA: Diagnosis present

## 2022-08-29 DIAGNOSIS — R519 Headache, unspecified: Secondary | ICD-10-CM | POA: Diagnosis present

## 2022-08-30 ENCOUNTER — Encounter (HOSPITAL_COMMUNITY): Payer: 59

## 2022-08-30 NOTE — Progress Notes (Signed)
Cardiac Individual Treatment Plan  Patient Details  Name: Michael [name] MRN: [id number] Date of Birth: 06-04-1960 Referring Provider:   Flowsheet Row CARDIAC REHAB PHASE II ORIENTATION from 07/13/2022 in [facility] CARDIAC REHABILITATION  Referring Provider Dr. Okey Dupre       Initial Encounter Date:  Flowsheet Row CARDIAC REHAB PHASE II ORIENTATION from 07/13/2022 in [place] CARDIAC REHABILITATION  Date 07/13/22       Visit Diagnosis: ST elevation myocardial infarction involving right coronary artery [facility])  Patient's Home Medications on Admission:  Current Outpatient Medications:    albuterol (PROVENTIL) (2.5 MG/3ML) 0.083% nebulizer solution, Take 2.5 mg by nebulization every 6 (six) hours as needed., Disp: , Rfl:    albuterol (VENTOLIN HFA) 108 (90 Base) MCG/ACT inhaler, Inhale 2 puffs into the lungs every 6 (six) hours as needed for wheezing or shortness of breath., Disp: 8 g, Rfl: 5   amLODipine (NORVASC) 5 MG tablet, TAKE ONE TABLET BY MOUTH DAILY (MORNING), Disp: 30 tablet, Rfl: 6   ARIPiprazole (ABILIFY) 10 MG tablet, Take 10 mg by mouth daily., Disp: , Rfl:    aspirin EC 81 MG tablet, Take 1 tablet (81 mg total) by mouth daily. Swallow whole., Disp: 90 tablet, Rfl: 3   Cholecalciferol 1.25 MG (50000 UT) capsule, Take 50,000 Units by mouth daily., Disp: , Rfl:    cyclobenzaprine (FLEXERIL) 10 MG tablet, Take 1 tablet by mouth at bedtime as needed., Disp: , Rfl:    ezetimibe (ZETIA) 10 MG tablet, Take 1 tablet (10 mg total) by mouth daily., Disp: 90 tablet, Rfl: 0   FLUoxetine (PROZAC) 20 MG capsule, Take 20 mg by mouth every morning., Disp: , Rfl:    furosemide (LASIX) 20 MG tablet, Take 1 tablet (20 mg total) by mouth daily as needed., Disp: 90 tablet, Rfl: 3   meclizine (ANTIVERT) 25 MG tablet, Take 25 mg by mouth 3 (three) times daily. Medication on list from [facility] patient brought with him. He stated he was taking TID., Disp: , Rfl:    metformin (FORTAMET) 500 MG (OSM)  24 hr tablet, Take 500 mg by mouth 1 day or 1 dose., Disp: , Rfl:    methocarbamol (ROBAXIN) 750 MG tablet, Take 750 mg by mouth every 8 (eight) hours., Disp: , Rfl:    metoprolol succinate (TOPROL-XL) 25 MG 24 hr tablet, Take 1/2 tablet (12.5 mg total) by mouth every morning., Disp: 90 tablet, Rfl: 3   Nebulizer MISC, machine, Disp: , Rfl:    nitroGLYCERIN (NITROSTAT) 0.4 MG SL tablet, Place 1 tablet (0.4 mg total) under the tongue every 5 (five) minutes as needed for chest pain., Disp: 25 tablet, Rfl: 1   oxyCODONE (OXY IR/ROXICODONE) 5 MG immediate release tablet, Take 10 mg by mouth as needed for severe pain. Takes 10mg , Disp: , Rfl:    pregabalin (LYRICA) 200 MG capsule, Take 200 mg by mouth in the morning, at noon, and at bedtime., Disp: , Rfl:    rosuvastatin (CRESTOR) 40 MG tablet, Take 1 tablet (40 mg total) by mouth at bedtime., Disp: 90 tablet, Rfl: 3   Study - EVOLVE-MI - evolocumab (REPATHA) 140 mg/mL SQ injection (PI-Stuckey), Inject 1 mL (140 mg total) into the skin every 14 (fourteen) days. For Investigational Use Only. Inject subcutaneously into abdomen, thigh, or upper arm every 14 days. Rotate injection sites and do not inject into areas where skin is tender, bruised, or red. Please contact [facility] for any questions or concerns regarding this medication.,  Disp: 12 mL, Rfl: 0   ticagrelor (BRILINTA) 90 MG TABS tablet, Take 1 tablet (90 mg total) by mouth 2 (two) times daily., Disp: 180 tablet, Rfl: 3   trazodone (DESYREL) 300 MG tablet, Take 300 mg by mouth at bedtime., Disp: , Rfl:    Vitamin D, Ergocalciferol, 50000 units CAPS, Take 1 capsule by mouth once a week., Disp: , Rfl:    zolpidem (AMBIEN) 10 MG tablet, Take 10 mg by mouth at bedtime as needed for sleep., Disp: , Rfl:   Past Medical History: Past Medical History:  Diagnosis Date   Allergic rhinitis    Arthritis    Chronic low back pain    follwed in Pain Clinic(Dr.Kirchmayer)   CKD (chronic kidney  disease), stage II    Depression    Depression with anxiety    GERD (gastroesophageal reflux disease)    History of kidney stones    passed   HLD (hyperlipidemia) 03/23/2019   Insomnia    Migraines    NSTEMI (non-ST elevated myocardial infarction) (HCC)    x2   Sarcoidosis    Multisystem,pulmonary and hepatic (Dr.Rourk)Liver bx in 06 mildly postive AMA but on re-check normal   Sleep apnea    "tested, but said it wasnt bad enough for a machine".    Tobacco Use: Social History   Tobacco Use  Smoking Status Every Day   Packs/day: 0.50   Years: 29.00   Additional pack years: 0.00   Total pack years: 14.50   Types: Cigarettes  Smokeless Tobacco Never  Tobacco Comments   Smokes 1/2 pack per day--09/20/20/ 3-4 cigs a day 06/01/22    Labs: Review Flowsheet       Latest Ref Rng & Units 03/20/2019 05/20/2019 06/01/2022 06/10/2022  Labs for ITP Cardiac and Pulmonary Rehab  Cholestrol 0 - 200 mg/dL 161  096  045  - 409   LDL (calc) 0 - 99 mg/dL 811  97  914  - 782   HDL-C >40 mg/dL 48  48  47  - 36   Trlycerides <150 mg/dL 56  956  213  - 086   Hemoglobin A1c 4.8 - 5.6 % 6.2  - 6.3  6.1   TCO2 22 - 32 mmol/L 24  - - 20     Capillary Blood Glucose: Lab Results  Component Value Date   GLUCAP 206 (H) 07/19/2022   GLUCAP 194 (H) 07/17/2022   GLUCAP 118 (H) 06/13/2022   GLUCAP 81 06/13/2022   GLUCAP 95 06/12/2022     Exercise Target Goals: Exercise Program Goal: Individual exercise prescription set using results from initial 6 min walk test and THRR while considering  patient's activity barriers and safety.   Exercise Prescription Goal: Starting with aerobic activity 30 plus minutes a day, 3 days per week for initial exercise prescription. Provide home exercise prescription and guidelines that participant acknowledges understanding prior to discharge.  Activity Barriers & Risk Stratification:  Activity Barriers & Cardiac Risk Stratification - 07/13/22 0808       Activity  Barriers & Cardiac Risk Stratification   Activity Barriers Arthritis;Left Hip Replacement;Right Hip Replacement;Joint Problems;Deconditioning;Shortness of Breath;Back Problems;Chest Pain/Angina;Balance Concerns    Cardiac Risk Stratification High             6 Minute Walk:  6 Minute Walk     Row Name 07/13/22 0919         6 Minute Walk   Phase Initial     Distance 1200 feet  Walk Time 6 minutes     # of Rest Breaks 0     MPH 2.27     METS 3.37     RPE 11     VO2 Peak 11.82     Symptoms Yes (comment)     Comments B hip pain (6/10)     Resting HR 98 bpm     Resting BP 90/70     Resting Oxygen Saturation  99 %     Exercise Oxygen Saturation  during 6 min walk 98 %     Max Ex. HR 125 bpm     Max Ex. BP 118/72     2 Minute Post BP 110/70              Oxygen Initial Assessment:   Oxygen Re-Evaluation:   Oxygen Discharge (Final Oxygen Re-Evaluation):   Initial Exercise Prescription:  Initial Exercise Prescription - 07/13/22 0900       Date of Initial Exercise RX and Referring Provider   Date 07/13/22    Referring Provider Dr. Okey Dupre    Expected Discharge Date 10/04/22      Treadmill   MPH 1    Grade 0    Minutes 22      NuStep   Level 1    SPM 65    Minutes 17      Prescription Details   Frequency (times per week) 3    Duration Progress to 30 minutes of continuous aerobic without signs/symptoms of physical distress      Intensity   THRR 40-80% of Max Heartrate 64-129    Ratings of Perceived Exertion 11-13      Resistance Training   Training Prescription Yes    Weight 4    Reps 10-15             Perform Capillary Blood Glucose checks as needed.  Exercise Prescription Changes:   Exercise Prescription Changes     Row Name 07/17/22 1300 07/19/22 1300 08/14/22 1400         Response to Exercise   Blood Pressure (Admit) 132/70 94/70 128/84     Blood Pressure (Exercise) 126/64 122/72 130/70     Blood Pressure (Exit) 102/62 104/56  120/70     Heart Rate (Admit) 81 bpm 90 bpm 88 bpm     Heart Rate (Exercise) 114 bpm 119 bpm 122 bpm     Heart Rate (Exit) 77 bpm 99 bpm 97 bpm     Rating of Perceived Exertion (Exercise) 15 11 13      Duration Continue with 30 min of aerobic exercise without signs/symptoms of physical distress. Continue with 30 min of aerobic exercise without signs/symptoms of physical distress. Continue with 30 min of aerobic exercise without signs/symptoms of physical distress.     Intensity THRR unchanged THRR unchanged THRR unchanged       Progression   Progression Continue to progress workloads to maintain intensity without signs/symptoms of physical distress. Continue to progress workloads to maintain intensity without signs/symptoms of physical distress. Continue to progress workloads to maintain intensity without signs/symptoms of physical distress.       Resistance Training   Training Prescription Yes Yes Yes     Weight 5 5 5      Reps 10-15 10-15 10-15     Time 10 Minutes 10 Minutes 10 Minutes       Treadmill   MPH 1 -- --     Grade 0 -- --  Minutes 17 -- --     METs 1.77 -- --       NuStep   Level 1 2 2      SPM 93 91 118     Minutes 22 39 39     METs 2.32 2.18 2.91              Exercise Comments:   Exercise Goals and Review:   Exercise Goals     Row Name 07/13/22 9629 07/31/22 1443 08/29/22 0936         Exercise Goals   Increase Physical Activity Yes Yes Yes     Intervention Provide advice, education, support and counseling about physical activity/exercise needs.;Develop an individualized exercise prescription for aerobic and resistive training based on initial evaluation findings, risk stratification, comorbidities and participant's personal goals. Provide advice, education, support and counseling about physical activity/exercise needs.;Develop an individualized exercise prescription for aerobic and resistive training based on initial evaluation findings, risk  stratification, comorbidities and participant's personal goals. Provide advice, education, support and counseling about physical activity/exercise needs.;Develop an individualized exercise prescription for aerobic and resistive training based on initial evaluation findings, risk stratification, comorbidities and participant's personal goals.     Expected Outcomes Long Term: Add in home exercise to make exercise part of routine and to increase amount of physical activity.;Short Term: Attend rehab on a regular basis to increase amount of physical activity.;Long Term: Exercising regularly at least 3-5 days a week. Long Term: Add in home exercise to make exercise part of routine and to increase amount of physical activity.;Short Term: Attend rehab on a regular basis to increase amount of physical activity.;Long Term: Exercising regularly at least 3-5 days a week. Long Term: Add in home exercise to make exercise part of routine and to increase amount of physical activity.;Short Term: Attend rehab on a regular basis to increase amount of physical activity.;Long Term: Exercising regularly at least 3-5 days a week.     Increase Strength and Stamina Yes Yes Yes     Intervention Provide advice, education, support and counseling about physical activity/exercise needs.;Develop an individualized exercise prescription for aerobic and resistive training based on initial evaluation findings, risk stratification, comorbidities and participant's personal goals. Provide advice, education, support and counseling about physical activity/exercise needs.;Develop an individualized exercise prescription for aerobic and resistive training based on initial evaluation findings, risk stratification, comorbidities and participant's personal goals. Provide advice, education, support and counseling about physical activity/exercise needs.;Develop an individualized exercise prescription for aerobic and resistive training based on initial  evaluation findings, risk stratification, comorbidities and participant's personal goals.     Expected Outcomes Short Term: Increase workloads from initial exercise prescription for resistance, speed, and METs.;Short Term: Perform resistance training exercises routinely during rehab and add in resistance training at home;Long Term: Improve cardiorespiratory fitness, muscular endurance and strength as measured by increased METs and functional capacity ( ) Short Term: Increase workloads from initial exercise prescription for resistance, speed, and METs.;Short Term: Perform resistance training exercises routinely during rehab and add in resistance training at home;Long Term: Improve cardiorespiratory fitness, muscular endurance and strength as measured by increased METs and functional capacity ( ) Short Term: Increase workloads from initial exercise prescription for resistance, speed, and METs.;Short Term: Perform resistance training exercises routinely during rehab and add in resistance training at home;Long Term: Improve cardiorespiratory fitness, muscular endurance and strength as measured by increased METs and functional capacity ( )     Able to understand and use rate of perceived exertion (RPE) scale Yes Yes  Yes     Intervention Provide education and explanation on how to use RPE scale Provide education and explanation on how to use RPE scale Provide education and explanation on how to use RPE scale     Expected Outcomes Short Term: Able to use RPE daily in rehab to express subjective intensity level;Long Term:  Able to use RPE to guide intensity level when exercising independently Short Term: Able to use RPE daily in rehab to express subjective intensity level;Long Term:  Able to use RPE to guide intensity level when exercising independently Short Term: Able to use RPE daily in rehab to express subjective intensity level;Long Term:  Able to use RPE to guide intensity level when exercising  independently     Knowledge and understanding of Target Heart Rate Range (THRR) Yes Yes Yes     Intervention Provide education and explanation of THRR including how the numbers were predicted and where they are located for reference Provide education and explanation of THRR including how the numbers were predicted and where they are located for reference Provide education and explanation of THRR including how the numbers were predicted and where they are located for reference     Expected Outcomes Short Term: Able to state/look up THRR;Long Term: Able to use THRR to govern intensity when exercising independently;Short Term: Able to use daily as guideline for intensity in rehab Short Term: Able to state/look up THRR;Long Term: Able to use THRR to govern intensity when exercising independently;Short Term: Able to use daily as guideline for intensity in rehab Short Term: Able to state/look up THRR;Long Term: Able to use THRR to govern intensity when exercising independently;Short Term: Able to use daily as guideline for intensity in rehab     Able to check pulse independently Yes Yes Yes     Intervention Provide education and demonstration on how to check pulse in carotid and radial arteries.;Review the importance of being able to check your own pulse for safety during independent exercise Provide education and demonstration on how to check pulse in carotid and radial arteries.;Review the importance of being able to check your own pulse for safety during independent exercise Provide education and demonstration on how to check pulse in carotid and radial arteries.;Review the importance of being able to check your own pulse for safety during independent exercise     Expected Outcomes Short Term: Able to explain why pulse checking is important during independent exercise;Long Term: Able to check pulse independently and accurately Short Term: Able to explain why pulse checking is important during independent  exercise;Long Term: Able to check pulse independently and accurately Short Term: Able to explain why pulse checking is important during independent exercise;Long Term: Able to check pulse independently and accurately     Understanding of Exercise Prescription Yes Yes Yes     Intervention Provide education, explanation, and written materials on patient's individual exercise prescription Provide education, explanation, and written materials on patient's individual exercise prescription Provide education, explanation, and written materials on patient's individual exercise prescription     Expected Outcomes Short Term: Able to explain program exercise prescription;Long Term: Able to explain home exercise prescription to exercise independently Short Term: Able to explain program exercise prescription;Long Term: Able to explain home exercise prescription to exercise independently Short Term: Able to explain program exercise prescription;Long Term: Able to explain home exercise prescription to exercise independently              Exercise Goals Re-Evaluation :  Exercise Goals Re-Evaluation  Row Name 07/31/22 1443 08/29/22 0936           Exercise Goal Re-Evaluation   Exercise Goals Review Increase Physical Activity;Increase Strength and Stamina;Able to understand and use rate of perceived exertion (RPE) scale;Knowledge and understanding of Target Heart Rate Range (THRR);Understanding of Exercise Prescription;Able to check pulse independently Increase Physical Activity;Increase Strength and Stamina;Able to understand and use rate of perceived exertion (RPE) scale;Knowledge and understanding of Target Heart Rate Range (THRR);Able to check pulse independently;Understanding of Exercise Prescription      Comments Pt ahs attended 3 session of caridac rehab. He has not came to class in over a week. We have called and was not able to leave a voicemail. He was currently exercising at 2.18 METs on the stepper.  Will update when he comes back to class Pt has attended 6 sessions of cardiac rehab. He has not attended clss in the past week. He stated that he wanted to spend more times with his grandkids and might not come to class. HE was also unreliable to come to class before as well. He was exercising at 2.91 METs on the stepper. Will update when pt returns to class.      Expected Outcomes Through exercise at rehab and home, the patient will meet their stated goals. Through exercise at rehab and home, the patient will meet their stated goals.                Discharge Exercise Prescription (Final Exercise Prescription Changes):  Exercise Prescription Changes - 08/14/22 1400       Response to Exercise   Blood Pressure (Admit) 128/84    Blood Pressure (Exercise) 130/70    Blood Pressure (Exit) 120/70    Heart Rate (Admit) 88 bpm    Heart Rate (Exercise) 122 bpm    Heart Rate (Exit) 97 bpm    Rating of Perceived Exertion (Exercise) 13    Duration Continue with 30 min of aerobic exercise without signs/symptoms of physical distress.    Intensity THRR unchanged      Progression   Progression Continue to progress workloads to maintain intensity without signs/symptoms of physical distress.      Resistance Training   Training Prescription Yes    Weight 5    Reps 10-15    Time 10 Minutes      NuStep   Level 2    SPM 118    Minutes 39    METs 2.91             Nutrition:  Target Goals: Understanding of nutrition guidelines, daily intake of sodium 1500mg , cholesterol 200mg , calories 30% from fat and 7% or less from saturated fats, daily to have 5 or more servings of fruits and vegetables.  Biometrics:  Pre Biometrics - 07/13/22 0922       Pre Biometrics   Height 6\' 2"  (1.88 m)    Weight 106 kg    Waist Circumference 42 inches    Hip Circumference 42 inches    Waist to Hip Ratio 1 %    BMI (Calculated) 29.99    Triceps Skinfold 20 mm    % Body Fat 29.6 %    Grip Strength 35.2  kg    Flexibility 0 in    Single Leg Stand 0 seconds              Nutrition Therapy Plan and Nutrition Goals:  Nutrition Therapy & Goals - 07/13/22 4098       Personal  Nutrition Goals   Comments Patient scored 39 on his diet assessment. He trys to follow a low sodium diet. Handout provided and explained on heart heatlhier choices and RD referral offered. Patient declinced. We offer educational sessions on heart healthy nutrition with handouts.      Intervention Plan   Intervention Nutrition handout(s) given to patient.    Expected Outcomes Short Term Goal: Understand basic principles of dietary content, such as calories, fat, sodium, cholesterol and nutrients.             Nutrition Assessments:  Nutrition Assessments - 07/13/22 0918       MEDFICTS Scores   Pre Score 39            MEDIFICTS Score Key: ?70 Need to make dietary changes  40-70 Heart Healthy Diet ? 40 Therapeutic Level Cholesterol Diet   Picture Your Plate Scores: <40 Unhealthy dietary pattern with much room for improvement. 41-50 Dietary pattern unlikely to meet recommendations for good health and room for improvement. 51-60 More healthful dietary pattern, with some room for improvement.  >60 Healthy dietary pattern, although there may be some specific behaviors that could be improved.    Nutrition Goals Re-Evaluation:   Nutrition Goals Discharge (Final Nutrition Goals Re-Evaluation):   Psychosocial: Target Goals: Acknowledge presence or absence of significant depression and/or stress, maximize coping skills, provide positive support system. Participant is able to verbalize types and ability to use techniques and skills needed for reducing stress and depression.  Initial Review & Psychosocial Screening:  Initial Psych Review & Screening - 07/13/22 0953       Initial Review   Current issues with Current Depression;Current Anxiety/Panic;History of Depression;Current Psychotropic Meds       Family Dynamics   Good Support System? Yes      Barriers   Psychosocial barriers to participate in program The patient should benefit from training in stress management and relaxation.      Screening Interventions   Interventions Encouraged to exercise    Expected Outcomes Short Term goal: Utilizing psychosocial counselor, staff and physician to assist with identification of specific Stressors or current issues interfering with healing process. Setting desired goal for each stressor or current issue identified.;Short Term goal: Identification and review with participant of any Quality of Life or Depression concerns found by scoring the questionnaire.             Quality of Life Scores:  Quality of Life - 07/13/22 0922       Quality of Life   Select Quality of Life      Quality of Life Scores   Health/Function Pre 17.8 %    Socioeconomic Pre 19.9 %    Psych/Spiritual Pre 22.07 %    Family Pre 24 %    GLOBAL Pre 19.77 %            Scores of 19 and below usually indicate a poorer quality of life in these areas.  A difference of  2-3 points is a clinically meaningful difference.  A difference of 2-3 points in the total score of the Quality of Life Index has been associated with significant improvement in overall quality of life, self-image, physical symptoms, and general health in studies assessing change in quality of life.  PHQ-9: Review Flowsheet       07/13/2022 05/09/2018  Depression screen PHQ 2/9  Decreased Interest 1 0  Down, Depressed, Hopeless 1 0  PHQ - 2 Score 2 0  Altered sleeping 1 -  Tired, decreased energy 1 -  Change in appetite 0 -  Feeling bad or failure about yourself  0 -  Trouble concentrating 1 -  Moving slowly or fidgety/restless 1 -  Suicidal thoughts 0 -  PHQ-9 Score 6 -  Difficult doing work/chores Somewhat difficult -   Interpretation of Total Score  Total Score Depression Severity:  1-4 = Minimal depression, 5-9 = Mild depression,  10-14 = Moderate depression, 15-19 = Moderately severe depression, 20-27 = Severe depression   Psychosocial Evaluation and Intervention:  Psychosocial Evaluation - 07/13/22 1006       Psychosocial Evaluation & Interventions   Interventions Stress management education;Relaxation education;Encouraged to exercise with the program and follow exercise prescription    Comments Patient scored 6 on his PHQ-9. He says his chronic pain may be a barrier to participate in the program but no psychosocial barriers identified. He has a long history of depression and anxiety which is being managed with fluoxetine and Abilify. He says he feels his depression and anxiety are managed with the medication. He lives alone but reports having 2 daughers age 70 and 57. He says he is currently going through a divorce and he signed documentation yesterday. He says custody of the children has not been decided. He names several brothers as his support people. He says he has 30 siblings living all over the [place]. He did not provide any detailed answers. We says he does want to try and see if he can do the program and hopes his pain is not going to be an issue.    Expected Outcomes Patient's depression and anxiety will continue to be managed with medications and he will continue to have no psychosocial barriers identified.    Continue Psychosocial Services  No Follow up required             Psychosocial Re-Evaluation:  Psychosocial Re-Evaluation     Row Name 07/24/22 [phone] 08/21/22 0824           Psychosocial Re-Evaluation   Current issues with History of Depression;Current Anxiety/Panic;Current Psychotropic Meds;Current Depression History of Depression;Current Anxiety/Panic;Current Psychotropic Meds;Current Depression      Comments Patient is new to the program. He has completed 2 sessions. He did have pain and cramps in his left arm when walking on the treadmill but was able to exercise on the Nust[place] without difficulty.  His depression and anxiety and PTSD continue to be managed with Fluoxetine and Abilify and his he sleeps well with Ambien. We will continue to monitor his progress. Patient has completed 6 sessions. His depression and anxiety and PTSD continue to be managed with Fluoxetine and Abilify and his he sleeps well with Ambien. He continues to have no psychosocial barriers identified. He seems to enjoy the sessions and demonstrates an interest in improving his health. We will continue to monitor his progress.      Expected Outcomes Patient's anxiety, depression and PTSD will continue to be managed with medication and his pain will be managed and will not be a barrier to exercise. Patient's anxiety, depression and PTSD will continue to be managed with medication and his pain will be managed and will not be a barrier to exercise.      Interventions Stress management education;Relaxation education;Encouraged to attend Cardiac Rehabilitation for the exercise Stress management education;Relaxation education;Encouraged to attend Cardiac Rehabilitation for the exercise      Continue Psychosocial Services  No Follow up required No Follow up required  Psychosocial Discharge (Final Psychosocial Re-Evaluation):  Psychosocial Re-Evaluation - 08/21/22 0824       Psychosocial Re-Evaluation   Current issues with History of Depression;Current Anxiety/Panic;Current Psychotropic Meds;Current Depression    Comments Patient has completed 6 sessions. His depression and anxiety and PTSD continue to be managed with Fluoxetine and Abilify and his he sleeps well with Ambien. He continues to have no psychosocial barriers identified. He seems to enjoy the sessions and demonstrates an interest in improving his health. We will continue to monitor his progress.    Expected Outcomes Patient's anxiety, depression and PTSD will continue to be managed with medication and his pain will be managed and will not be a barrier to  exercise.    Interventions Stress management education;Relaxation education;Encouraged to attend Cardiac Rehabilitation for the exercise    Continue Psychosocial Services  No Follow up required             Vocational Rehabilitation: Provide vocational rehab assistance to qualifying candidates.   Vocational Rehab Evaluation & Intervention:  Vocational Rehab - 07/13/22 1610       Initial Vocational Rehab Evaluation & Intervention   Assessment shows need for Vocational Rehabilitation No      Vocational Rehab Re-Evaulation   Comments Patient is disabled and does not need vocational rehab.             Education: Education Goals: Education classes will be provided on a weekly basis, covering required topics. Participant will state understanding/return demonstration of topics presented.  Learning Barriers/Preferences:  Learning Barriers/Preferences - 07/13/22 0920       Learning Barriers/Preferences   Learning Barriers None             Education Topics: Hypertension, Hypertension Reduction -Define heart disease and high blood pressure. Discus how high blood pressure affects the body and ways to reduce high blood pressure. Flowsheet Row CARDIAC REHAB PHASE II EXERCISE from 08/14/2022 in [place] C[facility]IAC RE[facility]ITATION  Date 07/19/22  Educator DJ  Instruction Review Code 2- Demonstrated Understanding       Exercise and Your Heart -Discuss why it is important to exercise, the FITT principles of exercise, normal and abnormal responses to exercise, and how to exercise safely.   Angina -Discuss definition of angina, causes of angina, treatment of angina, and how to decrease risk of having angina.   Cardiac Medications -Review what the following cardiac medications are used for, how they affect the body, and side effects that may occur when taking the medications.  Medications include Aspirin, Beta blockers, calcium channel blockers, ACE Inhibitors, angiotensin  receptor blockers, diuretics, digoxin, and antihyperlipidemics.   Congestive Heart Failure -Discuss the definition of CHF, how to live with CHF, the signs and symptoms of CHF, and how keep track of weight and sodium intake.   Heart Disease and Intimacy -Discus the effect sexual activity has on the heart, how changes occur during intimacy as we age, and safety during sexual activity.   Smoking Cessation / COPD -Discuss different methods to quit smoking, the health benefits of quitting smoking, and the definition of COPD.   Nutrition I: Fats -Discuss the types of cholesterol, what cholesterol does to the heart, and how cholesterol levels can be controlled.   Nutrition II: Labels -Discuss the different components of food labels and how to read food label   Heart Parts/Heart Disease and PAD -Discuss the anatomy of the heart, the pathway of blood circulation through the heart, and these are affected by heart disease.   Stress  I: Signs and Symptoms -Discuss the causes of stress, how stress may lead to anxiety and depression, and ways to limit stress.   Stress II: Relaxation -Discuss different types of relaxation techniques to limit stress.   Warning Signs of Stroke / TIA -Discuss definition of a stroke, what the signs and symptoms are of a stroke, and how to identify when someone is having stroke.   Knowledge Questionnaire Score:  Knowledge Questionnaire Score - 07/13/22 0920       Knowledge Questionnaire Score   Pre Score 20/24             Core Components/Risk Factors/Patient Goals at Admission:  Personal Goals and Risk Factors at Admission - 07/13/22 0921       Core Components/Risk Factors/Patient Goals on Admission    Weight Management Yes    Intervention Weight Management/Obesity: Establish reasonable short term and long term weight goals.;Obesity: Provide education and appropriate resources to help participant work on and attain dietary goals.    Admit Weight  233 lb 14.4 oz (106.1 kg)    Goal Weight: Long Term 223 lb (101.2 kg)    Tobacco Cessation Yes    Number of packs per day 3    Intervention Offer self-teaching materials, assist with locating and accessing local/national Quit Smoking programs, and support quit date choice.;Assist the participant in steps to quit. Provide individualized education and counseling about committing to Tobacco Cessation, relapse prevention, and pharmacological support that can be provided by physician.    Expected Outcomes Long Term: Complete abstinence from all tobacco products for at least 12 months from quit date.;Short Term: Will quit all tobacco product use, adhering to prevention of relapse plan.    Improve shortness of breath with ADL's Yes    Intervention Provide education, individualized exercise plan and daily activity instruction to help decrease symptoms of SOB with activities of daily living.    Expected Outcomes Short Term: Improve cardiorespiratory fitness to achieve a reduction of symptoms when performing ADLs;Long Term: Be able to perform more ADLs without symptoms or delay the onset of symptoms    Hypertension Yes    Intervention Provide education on lifestyle modifcations including regular physical activity/exercise, weight management, moderate sodium restriction and increased consumption of fresh fruit, vegetables, and low fat dairy, alcohol moderation, and smoking cessation.;Monitor prescription use compliance.    Expected Outcomes Short Term: Continued assessment and intervention until BP is < 140/60mm HG in hypertensive participants. < 130/74mm HG in hypertensive participants with diabetes, heart failure or chronic kidney disease.;Long Term: Maintenance of blood pressure at goal levels.    Lipids Yes    Intervention Provide education and support for participant on nutrition & aerobic/resistive exercise along with prescribed medications to achieve LDL 70mg , HDL >40mg .    Expected Outcomes Short Term:  Participant states understanding of desired cholesterol values and is compliant with medications prescribed. Participant is following exercise prescription and nutrition guidelines.;Long Term: Cholesterol controlled with medications as prescribed, with individualized exercise RX and with personalized nutrition plan. Value goals: LDL < 70mg , HDL > 40 mg.    Personal Goal Other Yes    Personal Goal Patient wants to lose weight and breathe better.    Intervention Patient will attend CR 3 days/week with exercise and education.    Expected Outcomes Patient will complete the program meeting both personal and program goals.             Core Components/Risk Factors/Patient Goals Review:   Goals and Risk Factor Review  Row Name 07/24/22 0831 08/21/22 0825           Core Components/Risk Factors/Patient Goals Review   Personal Goals Review Weight Management/Obesity;Lipids;Hypertension;Improve shortness of breath with ADL's;Tobacco Cessation;Other Weight Management/Obesity;Lipids;Hypertension;Improve shortness of breath with ADL's;Tobacco Cessation;Other      Review Patient was referred to CR with STEMI. He has multiple risk factors for CAD and is participating in the program for risk modification. He has completed 2 sessions with his current weight at 232.3 lbs losing 0.7 lbs since his initial visit. His blood pressure is well controlled. He saw cardiology 4/9 for thrombus monitoring. No changes made. His personal goals for the program are to lose weight and breathe better. We will continue to monitor his progress as he works towards meeting these goals. Patient has completed 6 sessions with his current weight at 241.0 lbs gaining 8 lbs since last 30 day review. He is doing well in the program with some progressions. His attendance has not been consistent due to him wanting to attend end of year school event with his grandchildren. His blood pressure continues to be well controlled. He saw cardiology  4/26 seeing [place]. She ordered an MRI of his brain due to complaints of dizziness. MRI is scheduled for 5/21 and also said he may need neurology referral. His DM is manged with Metformin and his last A1C was 6.1 06/10/22. His personal goals for the program are to lose weight and breathe better. We will continue to monitor his progress as he works towards meeting these goals.      Expected Outcomes Patient will complete the program meeting both personal and program goals. Patient will complete the program meeting both personal and program goals.               Core Components/Risk Factors/Patient Goals at Discharge (Final Review):   Goals and Risk Factor Review - 08/21/22 0825       Core Components/Risk Factors/Patient Goals Review   Personal Goals Review Weight Management/Obesity;Lipids;Hypertension;Improve shortness of breath with ADL's;Tobacco Cessation;Other    Review Patient has completed 6 sessions with his current weight at 241.0 lbs gaining 8 lbs since last 30 day review. He is doing well in the program with some progressions. His attendance has not been consistent due to him wanting to attend end of year school event with his grandchildren. His blood pressure continues to be well controlled. He saw cardiology 4/26 seeing [place]. She ordered an MRI of his brain due to complaints of dizziness. MRI is scheduled for 5/21 and also said he may need neurology referral. His DM is manged with Metformin and his last A1C was 6.1 06/10/22. His personal goals for the program are to lose weight and breathe better. We will continue to monitor his progress as he works towards meeting these goals.    Expected Outcomes Patient will complete the program meeting both personal and program goals.             ITP Comments:   Comments: ITP REVIEW Pt is making expected progress toward Cardiac Rehab goals after completing 6 sessions. Recommend continued exercise, life style modification,  education, and increased stamina and strength.

## 2022-09-01 ENCOUNTER — Encounter (HOSPITAL_COMMUNITY): Payer: 59

## 2022-09-04 ENCOUNTER — Encounter (HOSPITAL_COMMUNITY): Payer: 59

## 2022-09-05 ENCOUNTER — Telehealth: Payer: Self-pay

## 2022-09-05 DIAGNOSIS — R42 Dizziness and giddiness: Secondary | ICD-10-CM

## 2022-09-05 NOTE — Telephone Encounter (Signed)
-----   Message from Ellsworth Lennox, [place] sent at 09/01/2022  3:55 PM EDT ----- Please let the patient know his Brain MRI showed no evidence of a prior stroke. Was noted to have chronic small vessel ischemia and was also noted to have bilateral mastoid effusions (fluid along mastoid bone area). Given this finding and his persistent dizziness and headaches, would recommend referral to ENT for evaluation.

## 2022-09-05 NOTE — Telephone Encounter (Signed)
Results discussed with patient, referral placed to ENT, copied pcp

## 2022-09-06 ENCOUNTER — Encounter (HOSPITAL_COMMUNITY): Payer: 59

## 2022-09-08 ENCOUNTER — Encounter (HOSPITAL_COMMUNITY)
Admission: RE | Admit: 2022-09-08 | Discharge: 2022-09-08 | Disposition: A | Discharge status: Home / Self Care | Payer: 59 | Source: Ambulatory Visit | Attending: Cardiology | Admitting: Cardiology

## 2022-09-08 DIAGNOSIS — I2111 ST elevation (STEMI) myocardial infarction involving right coronary artery: Secondary | ICD-10-CM | POA: Diagnosis not present

## 2022-09-08 NOTE — Progress Notes (Signed)
Daily Session Note  Patient Details  Name: Michael [name] MRN: [id number] Date of Birth: 07-23-1960 Referring Provider:   Flowsheet Row CARDIAC REHAB PHASE II ORIENTATION from 07/13/2022 in [facility] C[facility]  Referring Provider Dr. Okey Dupre       Encounter Date: 09/08/2022  Check In:  Session Check In - 09/08/22 1100       Check-In   Supervising physician immediately available to respond to emergencies [facility] MD immediately available    Physician(s) Dr. [name]    Location [facility]    Staff Present Cristopher Peru MHA, MS, ACSM-CEP;Ranette Luckadoo Laural Benes, RN, Pleas Koch, RN, BSN    Virtual Visit No    Medication changes reported     No    Fall or balance concerns reported    Yes    Comments Patient reports poor balance and history of 8 to 15 falls in past year.    Tobacco Cessation No Change    Warm-up and Cool-down Performed as group-led instruction    Resistance Training Performed Yes    VAD Patient? No    PAD/SET Patient? No      Pain Assessment   Currently in Pain? No/denies    Pain Score 0-No pain    Multiple Pain Sites No             Capillary Blood Glucose: No results found for this or any previous visit (from the past 24 hour(s)).    Social History   Tobacco Use  Smoking Status Every Day   Packs/day: 0.50   Years: 29.00   Additional pack years: 0.00   Total pack years: 14.50   Types: Cigarettes  Smokeless Tobacco Never  Tobacco Comments   Smokes 1/2 pack per day--09/20/20/ 3-4 cigs a day 06/01/22    Goals Met:  Independence with exercise equipment Exercise tolerated well No report of concerns or symptoms today Strength training completed today  Goals Unmet:  Not Applicable  Comments: Check out 1200.   Dr. [name] is Medical Director for [facility] Cardiac Re[facility]

## 2022-09-11 ENCOUNTER — Encounter (HOSPITAL_COMMUNITY)
Admission: RE | Admit: 2022-09-11 | Discharge: 2022-09-11 | Disposition: A | Discharge status: Home / Self Care | Payer: 59 | Source: Ambulatory Visit | Attending: Cardiology | Admitting: Cardiology

## 2022-09-11 ENCOUNTER — Encounter: Payer: Self-pay | Admitting: *Deleted

## 2022-09-11 VITALS — Wt 240.3 lb

## 2022-09-11 DIAGNOSIS — Z006 Encounter for examination for normal comparison and control in clinical research program: Secondary | ICD-10-CM

## 2022-09-11 DIAGNOSIS — I2111 ST elevation (STEMI) myocardial infarction involving right coronary artery: Secondary | ICD-10-CM | POA: Insufficient documentation

## 2022-09-11 NOTE — Research (Signed)
12 Week Follow-Up Visit Completed*   []  Not Necessary, No Potential Adverse Events Or Medication Issues Reported On Completed Subject Questionnaire   [x]  Yes, Contact With Subject/Alternate Contact Completed   []  Yes, No Contact With Subject/Alternate Contact Completed, But Electronic Health Record Was Reviewed   []  No, Unable To Contact Subject/Alternate Contact   Have you reviewed Ongoing medications on the Targeted Concomitant Medication form and updated the form as needed?   [x]  Yes   []  No   Subject Status*   [x]  Continuing In Follow-up   []  At Risk For Lost To Follow-up   []  Withdrawal From All Future Study Activities Including Passive Follow-up By Electronic Health Record Review Or Contact With Healthcare Provider Or Family Member/Friend   []  Death   Vital Status*   [x]  Alive   []  Deceased   []  Unknown   Last Known To Be Alive Source*   [x]  Subject Completed Follow-up Questionnaire/Seen In Person/Via Telephone Contact   []  Family Member or Caretaker   []  Primary Physician Or Medical Records   []  Publicly Available Source   []  Other  Date of last dose taken   02/June/2024  Over the last 12 weeks did the subject miss any doses? 1  Over the last 12 weeks did the subject restart Evolocumab after an interruption? No

## 2022-09-11 NOTE — Progress Notes (Signed)
Daily Session Note  Patient Details  Name: Michael [name] MRN: [id number] Date of Birth: 07/03/1960 Referring Provider:   Flowsheet Row CARDIAC REHAB PHASE II ORIENTATION from 07/13/2022 in [facility] CARDIAC REHAB[facility]  Referring Provider Dr. Okey Dupre       Encounter Date: 09/11/2022  Check In:  Session Check In - 09/11/22 1053       Check-In   Supervising physician immediately available to respond to emergencies [facility] MD immediately available    Physician(s) Dr. Tenny Craw    Location [facility]    Staff Present Ross Ludwig, BS, Exercise Physiologist;Beryle Bagsby Laural Benes, RN, Pleas Koch, RN, BSN    Virtual Visit No    Medication changes reported     No    Fall or balance concerns reported    Yes    Comments Patient reports poor balance and history of 8 to 15 falls in past year.    Tobacco Cessation No Change    Warm-up and Cool-down Performed as group-led instruction    Resistance Training Performed Yes    VAD Patient? No    PAD/SET Patient? No      Pain Assessment   Currently in Pain? No/denies    Pain Score 0-No pain    Multiple Pain Sites No             Capillary Blood Glucose: No results found for this or any previous visit (from the past 24 hour(s)).    Social History   Tobacco Use  Smoking Status Every Day   Packs/day: 0.50   Years: 29.00   Additional pack years: 0.00   Total pack years: 14.50   Types: Cigarettes  Smokeless Tobacco Never  Tobacco Comments   Smokes 1/2 pack per day--09/20/20/ 3-4 cigs a day 06/01/22    Goals Met:  Independence with exercise equipment Exercise tolerated well No report of concerns or symptoms today Strength training completed today  Goals Unmet:  Not Applicable  Comments: Check out 1200.   Dr. [name] is Medical Director for [name] Cardiac Rehab

## 2022-09-13 ENCOUNTER — Encounter (HOSPITAL_COMMUNITY): Payer: 59

## 2022-09-15 ENCOUNTER — Encounter (HOSPITAL_COMMUNITY): Payer: 59

## 2022-09-18 ENCOUNTER — Encounter (HOSPITAL_COMMUNITY): Payer: 59

## 2022-09-20 ENCOUNTER — Encounter (HOSPITAL_COMMUNITY): Payer: 59

## 2022-09-21 NOTE — Addendum Note (Signed)
Encounter addended by: Suann Larry, RN on: 09/21/2022 2:40 PM  Actions taken: Visit diagnoses modified, Flowsheet data copied forward, Flowsheet accepted, Clinical Note Signed, Episode resolved

## 2022-09-21 NOTE — Progress Notes (Signed)
Discharge Progress Report  Patient Details  Name: Michael [name] MRN: [id number] Date of Birth: October 14, 1960 Referring Provider:   Flowsheet Row CARDIAC REHAB PHASE II ORIENTATION from 07/13/2022 in [facility] CARDIAC REHABILITATION  Referring Provider Dr. Okey Dupre        Number of Visits: 8  Reason for Discharge:  Early Exit:  Personal  Smoking History:  Social History   Tobacco Use  Smoking Status Every Day   Packs/day: 0.50   Years: 29.00   Additional pack years: 0.00   Total pack years: 14.50   Types: Cigarettes  Smokeless Tobacco Never  Tobacco Comments   Smokes 1/2 pack per day--09/20/20/ 3-4 cigs a day 06/01/22    Diagnosis:  ST elevation myocardial infarction involving right coronary artery (HCC)  ADL UCSD:   Initial Exercise Prescription:  Initial Exercise Prescription - 07/13/22 0900       Date of Initial Exercise RX and Referring Provider   Date 07/13/22    Referring Provider Dr. Okey Dupre    Expected Discharge Date 10/04/22      Treadmill   MPH 1    Grade 0    Minutes 22      NuStep   Level 1    SPM 65    Minutes 17      Prescription Details   Frequency (times per week) 3    Duration Progress to 30 minutes of continuous aerobic without signs/symptoms of physical distress      Intensity   THRR 40-80% of Max Heartrate 64-129    Ratings of Perceived Exertion 11-13      Resistance Training   Training Prescription Yes    Weight 4    Reps 10-15             Discharge Exercise Prescription (Final Exercise Prescription Changes):  Exercise Prescription Changes - 09/11/22 1200       Response to Exercise   Blood Pressure (Admit) 128/64    Blood Pressure (Exercise) 130/70    Blood Pressure (Exit) 120/70    Heart Rate (Admit) 95 bpm    Heart Rate (Exercise) 138 bpm    Heart Rate (Exit) 104 bpm    Rating of Perceived Exertion (Exercise) 13    Duration Continue with 30 min of aerobic exercise without signs/symptoms of physical distress.    Intensity  THRR unchanged      Progression   Progression Continue to progress workloads to maintain intensity without signs/symptoms of physical distress.      Resistance Training   Training Prescription Yes    Weight 8    Reps 10-15    Time 10 Minutes      Treadmill   MPH 3    Grade 0    Minutes 17    METs 3.3      NuStep   Level 3    SPM 102    Minutes 22    METs 2.8             Functional Capacity:  6 Minute Walk     Row Name 07/13/22 0919         6 Minute Walk   Phase Initial     Distance 1200 feet     Walk Time 6 minutes     # of Rest Breaks 0     MPH 2.27     METS 3.37     RPE 11     VO2 Peak 11.82     Symptoms Yes (comment)  Comments B hip pain (6/10)     Resting HR 98 bpm     Resting BP 90/70     Resting Oxygen Saturation  99 %     Exercise Oxygen Saturation  during 6 min walk 98 %     Max Ex. HR 125 bpm     Max Ex. BP 118/72     2 Minute Post BP 110/70              Psychological, QOL, Others - Outcomes: PHQ 2/9:    07/13/2022    9:07 AM 05/09/2018   12:04 PM  Depression screen PHQ 2/9  Decreased Interest 1 0  Down, Depressed, Hopeless 1 0  PHQ - 2 Score 2 0  Altered sleeping 1   Tired, decreased energy 1   Change in appetite 0   Feeling bad or failure about yourself  0   Trouble concentrating 1   Moving slowly or fidgety/restless 1   Suicidal thoughts 0   PHQ-9 Score 6   Difficult doing work/chores Somewhat difficult     Quality of Life:  Quality of Life - 07/13/22 0922       Quality of Life   Select Quality of Life      Quality of Life Scores   Health/Function Pre 17.8 %    Socioeconomic Pre 19.9 %    Psych/Spiritual Pre 22.07 %    Family Pre 24 %    GLOBAL Pre 19.77 %             Personal Goals: Goals established at orientation with interventions provided to work toward goal.  Personal Goals and Risk Factors at Admission - 07/13/22 0921       Core Components/Risk Factors/Patient Goals on Admission    Weight  Management Yes    Intervention Weight Management/Obesity: Establish reasonable short term and long term weight goals.;Obesity: Provide education and appropriate resources to help participant work on and attain dietary goals.    Admit Weight 233 lb 14.4 oz (106.1 kg)    Goal Weight: Long Term 223 lb (101.2 kg)    Tobacco Cessation Yes    Number of packs per day 3    Intervention Offer self-teaching materials, assist with locating and accessing local/national Quit Smoking programs, and support quit date choice.;Assist the participant in steps to quit. Provide individualized education and counseling about committing to Tobacco Cessation, relapse prevention, and pharmacological support that can be provided by physician.    Expected Outcomes Long Term: Complete abstinence from all tobacco products for at least 12 months from quit date.;Short Term: Will quit all tobacco product use, adhering to prevention of relapse plan.    Improve shortness of breath with ADL's Yes    Intervention Provide education, individualized exercise plan and daily activity instruction to help decrease symptoms of SOB with activities of daily living.    Expected Outcomes Short Term: Improve cardiorespiratory fitness to achieve a reduction of symptoms when performing ADLs;Long Term: Be able to perform more ADLs without symptoms or delay the onset of symptoms    Hypertension Yes    Intervention Provide education on lifestyle modifcations including regular physical activity/exercise, weight management, moderate sodium restriction and increased consumption of fresh fruit, vegetables, and low fat dairy, alcohol moderation, and smoking cessation.;Monitor prescription use compliance.    Expected Outcomes Short Term: Continued assessment and intervention until BP is < 140/84mm HG in hypertensive participants. < 130/36mm HG in hypertensive participants with diabetes, heart failure or chronic kidney  disease.;Long Term: Maintenance of blood  pressure at goal levels.    Lipids Yes    Intervention Provide education and support for participant on nutrition & aerobic/resistive exercise along with prescribed medications to achieve LDL 70mg , HDL >40mg .    Expected Outcomes Short Term: Participant states understanding of desired cholesterol values and is compliant with medications prescribed. Participant is following exercise prescription and nutrition guidelines.;Long Term: Cholesterol controlled with medications as prescribed, with individualized exercise RX and with personalized nutrition plan. Value goals: LDL < 70mg , HDL > 40 mg.    Personal Goal Other Yes    Personal Goal Patient wants to lose weight and breathe better.    Intervention Patient will attend CR 3 days/week with exercise and education.    Expected Outcomes Patient will complete the program meeting both personal and program goals.              Personal Goals Discharge:  Goals and Risk Factor Review     Row Name 07/24/22 0831 08/21/22 0825 09/15/22 1453 09/21/22 1433       Core Components/Risk Factors/Patient Goals Review   Personal Goals Review Weight Management/Obesity;Lipids;Hypertension;Improve shortness of breath with ADL's;Tobacco Cessation;Other Weight Management/Obesity;Lipids;Hypertension;Improve shortness of breath with ADL's;Tobacco Cessation;Other Weight Management/Obesity;Lipids;Hypertension;Improve shortness of breath with ADL's;Tobacco Cessation;Other Weight Management/Obesity;Lipids;Hypertension;Improve shortness of breath with ADL's;Tobacco Cessation;Other    Review Patient was referred to CR with STEMI. He has multiple risk factors for CAD and is participating in the program for risk modification. He has completed 2 sessions with his current weight at 232.3 lbs losing 0.7 lbs since his initial visit. His blood pressure is well controlled. He saw cardiology 4/9 for thrombus monitoring. No changes made. His personal goals for the program are to lose  weight and breathe better. We will continue to monitor his progress as he works towards meeting these goals. Patient has completed 6 sessions with his current weight at 241.0 lbs gaining 8 lbs since last 30 day review. He is doing well in the program with some progressions. His attendance has not been consistent due to him wanting to attend end of year school event with his grandchildren. His blood pressure continues to be well controlled. He saw cardiology 4/26 seeing [place]. She ordered an MRI of his brain due to complaints of dizziness. MRI is scheduled for 5/21 and also said he may need neurology referral. His DM is manged with Metformin and his last A1C was 6.1 06/10/22. His personal goals for the program are to lose weight and breathe better. We will continue to monitor his progress as he works towards meeting these goals. Patient has completed 8 sessions with his current weight at 240.6 lbs losing 0.4 lbs since last 30 day review. He is doing well in the program with some progressions. His attendance continues to be inconsistent due to him wanting to attend end of year school event with his grandchildren. His blood pressure continues to be well controlled. Patient has his MRI 5/21 and was referred to ENT for bilateral mastiod effusions possibly causing his dizziness and headaches. His DM is manged with Metformin and his last A1C was 6.1 06/10/22. His personal goals for the program are to lose weight and breathe better. We will continue to monitor his progress as he works towards meeting these goals. Patient called and said he was not returning due to spending time with his grandchildren. He completed 8 sessions. His blood pressure was at goal. Not able to measure goals.    Expected Outcomes  Patient will complete the program meeting both personal and program goals. Patient will complete the program meeting both personal and program goals. Patient will complete the program meeting both personal and  program goals. --             Exercise Goals and Review:  Exercise Goals     Row Name 07/13/22 1610 07/31/22 1443 08/29/22 0936         Exercise Goals   Increase Physical Activity Yes Yes Yes     Intervention Provide advice, education, support and counseling about physical activity/exercise needs.;Develop an individualized exercise prescription for aerobic and resistive training based on initial evaluation findings, risk stratification, comorbidities and participant's personal goals. Provide advice, education, support and counseling about physical activity/exercise needs.;Develop an individualized exercise prescription for aerobic and resistive training based on initial evaluation findings, risk stratification, comorbidities and participant's personal goals. Provide advice, education, support and counseling about physical activity/exercise needs.;Develop an individualized exercise prescription for aerobic and resistive training based on initial evaluation findings, risk stratification, comorbidities and participant's personal goals.     Expected Outcomes Long Term: Add in home exercise to make exercise part of routine and to increase amount of physical activity.;Short Term: Attend rehab on a regular basis to increase amount of physical activity.;Long Term: Exercising regularly at least 3-5 days a week. Long Term: Add in home exercise to make exercise part of routine and to increase amount of physical activity.;Short Term: Attend rehab on a regular basis to increase amount of physical activity.;Long Term: Exercising regularly at least 3-5 days a week. Long Term: Add in home exercise to make exercise part of routine and to increase amount of physical activity.;Short Term: Attend rehab on a regular basis to increase amount of physical activity.;Long Term: Exercising regularly at least 3-5 days a week.     Increase Strength and Stamina Yes Yes Yes     Intervention Provide advice, education, support  and counseling about physical activity/exercise needs.;Develop an individualized exercise prescription for aerobic and resistive training based on initial evaluation findings, risk stratification, comorbidities and participant's personal goals. Provide advice, education, support and counseling about physical activity/exercise needs.;Develop an individualized exercise prescription for aerobic and resistive training based on initial evaluation findings, risk stratification, comorbidities and participant's personal goals. Provide advice, education, support and counseling about physical activity/exercise needs.;Develop an individualized exercise prescription for aerobic and resistive training based on initial evaluation findings, risk stratification, comorbidities and participant's personal goals.     Expected Outcomes Short Term: Increase workloads from initial exercise prescription for resistance, speed, and METs.;Short Term: Perform resistance training exercises routinely during rehab and add in resistance training at home;Long Term: Improve cardiorespiratory fitness, muscular endurance and strength as measured by increased METs and functional capacity ( ) Short Term: Increase workloads from initial exercise prescription for resistance, speed, and METs.;Short Term: Perform resistance training exercises routinely during rehab and add in resistance training at home;Long Term: Improve cardiorespiratory fitness, muscular endurance and strength as measured by increased METs and functional capacity ( ) Short Term: Increase workloads from initial exercise prescription for resistance, speed, and METs.;Short Term: Perform resistance training exercises routinely during rehab and add in resistance training at home;Long Term: Improve cardiorespiratory fitness, muscular endurance and strength as measured by increased METs and functional capacity ( )     Able to understand and use rate of perceived exertion (RPE) scale  Yes Yes Yes     Intervention Provide education and explanation on how to use RPE scale Provide education and explanation  on how to use RPE scale Provide education and explanation on how to use RPE scale     Expected Outcomes Short Term: Able to use RPE daily in rehab to express subjective intensity level;Long Term:  Able to use RPE to guide intensity level when exercising independently Short Term: Able to use RPE daily in rehab to express subjective intensity level;Long Term:  Able to use RPE to guide intensity level when exercising independently Short Term: Able to use RPE daily in rehab to express subjective intensity level;Long Term:  Able to use RPE to guide intensity level when exercising independently     Knowledge and understanding of Target Heart Rate Range (THRR) Yes Yes Yes     Intervention Provide education and explanation of THRR including how the numbers were predicted and where they are located for reference Provide education and explanation of THRR including how the numbers were predicted and where they are located for reference Provide education and explanation of THRR including how the numbers were predicted and where they are located for reference     Expected Outcomes Short Term: Able to state/look up THRR;Long Term: Able to use THRR to govern intensity when exercising independently;Short Term: Able to use daily as guideline for intensity in rehab Short Term: Able to state/look up THRR;Long Term: Able to use THRR to govern intensity when exercising independently;Short Term: Able to use daily as guideline for intensity in rehab Short Term: Able to state/look up THRR;Long Term: Able to use THRR to govern intensity when exercising independently;Short Term: Able to use daily as guideline for intensity in rehab     Able to check pulse independently Yes Yes Yes     Intervention Provide education and demonstration on how to check pulse in carotid and radial arteries.;Review the importance of being  able to check your own pulse for safety during independent exercise Provide education and demonstration on how to check pulse in carotid and radial arteries.;Review the importance of being able to check your own pulse for safety during independent exercise Provide education and demonstration on how to check pulse in carotid and radial arteries.;Review the importance of being able to check your own pulse for safety during independent exercise     Expected Outcomes Short Term: Able to explain why pulse checking is important during independent exercise;Long Term: Able to check pulse independently and accurately Short Term: Able to explain why pulse checking is important during independent exercise;Long Term: Able to check pulse independently and accurately Short Term: Able to explain why pulse checking is important during independent exercise;Long Term: Able to check pulse independently and accurately     Understanding of Exercise Prescription Yes Yes Yes     Intervention Provide education, explanation, and written materials on patient's individual exercise prescription Provide education, explanation, and written materials on patient's individual exercise prescription Provide education, explanation, and written materials on patient's individual exercise prescription     Expected Outcomes Short Term: Able to explain program exercise prescription;Long Term: Able to explain home exercise prescription to exercise independently Short Term: Able to explain program exercise prescription;Long Term: Able to explain home exercise prescription to exercise independently Short Term: Able to explain program exercise prescription;Long Term: Able to explain home exercise prescription to exercise independently              Exercise Goals Re-Evaluation:  Exercise Goals Re-Evaluation     Row Name 07/31/22 1443 08/29/22 0936           Exercise Goal Re-Evaluation  Exercise Goals Review Increase Physical  Activity;Increase Strength and Stamina;Able to understand and use rate of perceived exertion (RPE) scale;Knowledge and understanding of Target Heart Rate Range (THRR);Understanding of Exercise Prescription;Able to check pulse independently Increase Physical Activity;Increase Strength and Stamina;Able to understand and use rate of perceived exertion (RPE) scale;Knowledge and understanding of Target Heart Rate Range (THRR);Able to check pulse independently;Understanding of Exercise Prescription      Comments Pt ahs attended 3 session of caridac rehab. He has not came to class in over a week. We have called and was not able to leave a voicemail. He was currently exercising at 2.18 METs on the stepper. Will update when he comes back to class Pt has attended 6 sessions of cardiac rehab. He has not attended clss in the past week. He stated that he wanted to spend more times with his grandkids and might not come to class. HE was also unreliable to come to class before as well. He was exercising at 2.91 METs on the stepper. Will update when pt returns to class.      Expected Outcomes Through exercise at rehab and home, the patient will meet their stated goals. Through exercise at rehab and home, the patient will meet their stated goals.               Nutrition & Weight - Outcomes:  Pre Biometrics - 07/13/22 6962       Pre Biometrics   Height 6\' 2"  (1.88 m)    Weight 106 kg    Waist Circumference 42 inches    Hip Circumference 42 inches    Waist to Hip Ratio 1 %    BMI (Calculated) 29.99    Triceps Skinfold 20 mm    % Body Fat 29.6 %    Grip Strength 35.2 kg    Flexibility 0 in    Single Leg Stand 0 seconds              Nutrition:  Nutrition Therapy & Goals - 09/15/22 1452       Nutrition Therapy   RD appointment deferred Yes      Personal Nutrition Goals   Comments Patient scored 39 on his diet assessment. He trys to follow a low sodium diet. Handout provided and explained on heart  heatlhier choices and RD referral offered. Patient declinced. We offer educational sessions on heart healthy nutrition with handouts.      Intervention Plan   Intervention Nutrition handout(s) given to patient.    Expected Outcomes Short Term Goal: Understand basic principles of dietary content, such as calories, fat, sodium, cholesterol and nutrients.             Nutrition Discharge:  Nutrition Assessments - 07/13/22 0918       MEDFICTS Scores   Pre Score 39             Education Questionnaire Score:  Knowledge Questionnaire Score - 07/13/22 0920       Knowledge Questionnaire Score   Pre Score 20/24            Patient called and said he was not returning due to spending time with his grandchildren. He completed 8 sessions. His blood pressure was at goal. Not able to measure goals.

## 2022-09-22 ENCOUNTER — Other Ambulatory Visit (HOSPITAL_COMMUNITY): Payer: Self-pay

## 2022-09-22 ENCOUNTER — Encounter (HOSPITAL_COMMUNITY): Payer: 59

## 2022-09-25 ENCOUNTER — Encounter (HOSPITAL_COMMUNITY): Payer: 59

## 2022-09-27 ENCOUNTER — Encounter (HOSPITAL_COMMUNITY): Payer: 59

## 2022-09-29 ENCOUNTER — Encounter (HOSPITAL_COMMUNITY): Payer: 59

## 2022-10-02 ENCOUNTER — Encounter (HOSPITAL_COMMUNITY): Payer: 59

## 2022-10-04 ENCOUNTER — Encounter (HOSPITAL_COMMUNITY): Payer: 59

## 2022-10-16 ENCOUNTER — Other Ambulatory Visit: Payer: Self-pay

## 2022-10-16 MED ORDER — ALBUTEROL SULFATE HFA 108 (90 BASE) MCG/ACT IN AERS: 2.0000 | INHALATION_SPRAY | Freq: Four times a day (QID) | RESPIRATORY_TRACT | 5 refills | Status: AC | PRN

## 2022-10-20 ENCOUNTER — Encounter: Payer: Self-pay | Admitting: Pulmonary Disease

## 2022-10-20 ENCOUNTER — Ambulatory Visit (INDEPENDENT_AMBULATORY_CARE_PROVIDER_SITE_OTHER): Payer: 59 | Admitting: Pulmonary Disease

## 2022-10-20 VITALS — BP 138/82 | HR 93 | Ht 74.0 in | Wt 252.0 lb

## 2022-10-20 DIAGNOSIS — J411 Mucopurulent chronic bronchitis: Secondary | ICD-10-CM

## 2022-10-20 DIAGNOSIS — Z716 Tobacco abuse counseling: Secondary | ICD-10-CM

## 2022-10-20 MED ORDER — BUDESONIDE-FORMOTEROL FUMARATE 160-4.5 MCG/ACT IN AERO: 2.0000 | INHALATION_SPRAY | Freq: Two times a day (BID) | RESPIRATORY_TRACT | 12 refills | Status: AC

## 2022-10-20 NOTE — Progress Notes (Signed)
[facility]  Chief Complaint  Patient presents with   Follow-up    Breathing is overall doing well. He denies any new co's. He is using his albuterol inhaler 2 x daily on average.      Past Surgical History:  He  has a past surgical history that includes Laminectomy (2009); Back surgery (2012); Esophagogastroduodenoscopy (03/22/2004); Colonoscopy (11/05/2003); Hip Arthroplasty (Bilateral); Harvest bone graft (Left); Hip surgery (Left, 2017); Tooth Extraction (Bilateral, 12/28/2017); Resection distal clavical (Right, 01/23/2019); Coronary/Graft Acute MI Revascularization (N/A, 03/20/2019); LEFT HEART CATH AND CORONARY ANGIOGRAPHY (N/A, 03/20/2019); LEFT HEART CATH AND CORONARY ANGIOGRAPHY (N/A, 05/20/2019); Cardiac catheterization (05/20/2019); Coronary/Graft Acute MI Revascularization (N/A, 06/10/2022); and LEFT HEART CATH AND CORONARY ANGIOGRAPHY (N/A, 06/10/2022).  Past Medical History:  Allergies, A, Back pain, CKD, Depression, Anxiety, GERD, Nephrolithiasis, HLD, Migraine headache, Insomnia, Sarcoidosis with liver involvement, OSA, CAD  Constitutional:  BP 138/82 (BP Location: Left Arm, Cuff Size: Normal)   Pulse 93   Ht 6\' 2"  (1.88 m)   Wt 252 lb (114.3 kg)   SpO2 99% Comment: on RA  BMI 32.35 kg/m   Brief Summary:  Michael [name] is a 62 y.o. male smoker with chronic bronchitis.  He has hx of sarcoidosis from 1998 with lung and liver involvement.      Subjective:   He had heart catheterization in March.  He still smokes 1/2 to 1 ppd.  He plans to quit before his 64 th birthday.  He wants to make sure he is around for his daughters who are 5 and 6.    He has cough with clear sputum.  Not having wheeze, or sinus congestion.  Using albuterol twice per day and this helps.  Has lots of stress and smokes to help with stress.  Chest xray from 06/12/22 was normal.  Physical Exam:   Appearance - well kempt   ENMT - no sinus tenderness, no  oral exudate, no LAN, Mallampati 3 airway, no stridor  Respiratory - equal breath sounds bilaterally, no wheezing or rales  CV - s1s2 regular rate and rhythm, no murmurs  Ext - no clubbing, no edema  Skin - no rashes  Psych - normal mood and affect     Pulmonary testing:  PFT 08/11/20 >> FEV1 2.78 (80%), FEV1% 82, TLC 5.58 (75%), DLCO 44%  Chest Imaging:  HRCT chest 02/08/21 >> minimal nodularity and irregular opacities in upper lungs  Cardiac Tests:  Echo 06/11/22 >> EF 50%, asymmetric hypertrophy of septum  Social History:  He  reports that he has been smoking cigarettes. He has a 14.5 pack-year smoking history. He has never used smokeless tobacco. He reports that he does not currently use alcohol. He reports that he does not use drugs.  Family History:  His family history includes Colon cancer in his brother; Heart failure in his mother.     Assessment/Plan:   Chronic bronchitis. - he had trouble tolerating breo - will have him try symbicort 160 two puffs bid - prn albuterol  Tobacco abuse. - spent 4 minutes reviewing options to help with smoking cessation - he has set his 15 th birthday as when he plans to be done with cigarettes - he will think about lung cancer screening  Cardioembolic MI. - followed by Dr. [name] with [facility]  Time Spent Involved in Patient Care on Day of Examination:  35 minutes  Follow up:   Patient Instructions  Symbicort two puffs in the  morning and two puffs in the evening, and rinse your mouth after each use.  Follow up in 1 year.  Medication List:   Allergies as of 10/20/2022       Reactions   Fentanyl Rash   Other Reaction(s): Dizziness   Methadone Other (See Comments)   REACTION: "Become mentally unstable"        Medication List        Accurate as of October 20, 2022  8:49 AM. If you have any questions, ask your nurse or doctor.          albuterol (2.5 MG/3ML) 0.083% nebulizer solution Commonly  known as: PROVENTIL Take 2.5 mg by nebulization every 6 (six) hours as needed.   albuterol 108 (90 Base) MCG/ACT inhaler Commonly known as: VENTOLIN HFA Inhale 2 puffs into the lungs every 6 (six) hours as needed for wheezing or shortness of breath.   amLODipine 5 MG tablet Commonly known as: NORVASC TAKE ONE TABLET BY MOUTH DAILY (MORNING)   ARIPiprazole 10 MG tablet Commonly known as: ABILIFY Take 10 mg by mouth daily.   Aspirin Low Dose 81 MG tablet Generic drug: aspirin EC Take 1 tablet (81 mg total) by mouth daily. Swallow whole.   budesonide-formoterol 160-4.5 MCG/ACT inhaler Commonly known as: Symbicort Inhale 2 puffs into the lungs 2 (two) times daily. Started by: Coralyn Helling   Cholecalciferol 1.25 MG (50000 UT) capsule Take 50,000 Units by mouth daily.   cyclobenzaprine 10 MG tablet Commonly known as: FLEXERIL Take 1 tablet by mouth at bedtime as needed.   EVOLVE-MI evolocumab 140 mg/1 mL SQ injection Inject 1 mL (140 mg total) into the skin every 14 (fourteen) days. For Investigational Use Only. Inject subcutaneously into abdomen, thigh, or upper arm every 14 days. Rotate injection sites and do not inject into areas where skin is tender, bruised, or red. Please contact [facility] for any questions or concerns regarding this medication.   ezetimibe 10 MG tablet Commonly known as: Zetia Take 1 tablet (10 mg total) by mouth daily.   FLUoxetine 20 MG capsule Commonly known as: PROZAC Take 20 mg by mouth every morning.   furosemide 20 MG tablet Commonly known as: LASIX Take 1 tablet (20 mg total) by mouth daily as needed.   meclizine 25 MG tablet Commonly known as: ANTIVERT Take 25 mg by mouth 3 (three) times daily. Medication on list from [facility] patient brought with him. He stated he was taking TID.   metformin 500 MG (OSM) 24 hr tablet Commonly known as: FORTAMET Take 500 mg by mouth 1 day or 1 dose.   methocarbamol 750 MG  tablet Commonly known as: ROBAXIN Take 750 mg by mouth every 8 (eight) hours.   metoprolol succinate 25 MG 24 hr tablet Commonly known as: TOPROL-XL Take 1/2 tablet (12.5 mg total) by mouth every morning.   Nebulizer Misc machine   nitroGLYCERIN 0.4 MG SL tablet Commonly known as: NITROSTAT Place 1 tablet (0.4 mg total) under the tongue every 5 (five) minutes as needed for chest pain.   Oxycodone HCl 10 MG Tabs Take 10 mg by mouth every 6 (six) hours. What changed: Another medication with the same name was removed. Continue taking this medication, and follow the directions you see here. Changed by: Coralyn Helling   pregabalin 200 MG capsule Commonly known as: LYRICA Take 200 mg by mouth in the morning, at noon, and at bedtime.   rosuvastatin 40 MG tablet Commonly known as: CRESTOR Take  1 tablet (40 mg total) by mouth at bedtime.   ticagrelor 90 MG Tabs tablet Commonly known as: BRILINTA Take 1 tablet (90 mg total) by mouth 2 (two) times daily.   trazodone 300 MG tablet Commonly known as: DESYREL Take 300 mg by mouth at bedtime.   Vitamin D (Ergocalciferol) 50000 units Caps Take 1 capsule by mouth once a week.   zolpidem 10 MG tablet Commonly known as: AMBIEN Take 10 mg by mouth at bedtime as needed for sleep.        Signature:  Coralyn Helling, MD [facility] Pager - [phone] 10/20/2022, 8:49 AM

## 2022-10-20 NOTE — Patient Instructions (Signed)
Symbicort two puffs in the morning and two puffs in the evening, and rinse your mouth after each use.  Follow up in 1 year.

## 2022-11-03 ENCOUNTER — Encounter: Payer: Self-pay | Admitting: Student

## 2022-11-03 ENCOUNTER — Ambulatory Visit: Payer: 59 | Admitting: Student

## 2022-11-03 VITALS — BP 130/80 | HR 88 | Ht 74.0 in | Wt 245.4 lb

## 2022-11-03 DIAGNOSIS — I10 Essential (primary) hypertension: Secondary | ICD-10-CM

## 2022-11-03 DIAGNOSIS — H7493 Unspecified disorder of middle ear and mastoid, bilateral: Secondary | ICD-10-CM

## 2022-11-03 DIAGNOSIS — N1831 Chronic kidney disease, stage 3a: Secondary | ICD-10-CM

## 2022-11-03 DIAGNOSIS — E785 Hyperlipidemia, unspecified: Secondary | ICD-10-CM

## 2022-11-03 DIAGNOSIS — I6523 Occlusion and stenosis of bilateral carotid arteries: Secondary | ICD-10-CM

## 2022-11-03 DIAGNOSIS — I2510 Atherosclerotic heart disease of native coronary artery without angina pectoris: Secondary | ICD-10-CM | POA: Diagnosis not present

## 2022-11-03 DIAGNOSIS — I5032 Chronic diastolic (congestive) heart failure: Secondary | ICD-10-CM | POA: Diagnosis not present

## 2022-11-03 MED ORDER — CLOPIDOGREL BISULFATE 75 MG PO TABS: ORAL_TABLET | ORAL | 3 refills | Status: DC

## 2022-11-03 NOTE — Patient Instructions (Addendum)
Call Hematology at [phone] to reschedule your appointment.   Medication Instructions:  Your physician has recommended you make the following change in your medication:   Take Plavix 300 mg (4 Tablets) on day one and then take 75 mg (1 Tablet) daily starting on day two  *If you need a refill on your cardiac medications before your next appointment, please call your pharmacy*   Lab Work: Your physician recommends that you return for lab work in: Fasting   If you have labs (blood work) drawn today and your tests are completely normal, you will receive your results only by: MyChart Message (if you have MyChart) OR A paper copy in the mail If you have any lab test that is abnormal or we need to change your treatment, we will call you to review the results.   Testing/Procedures: NONE    Follow-Up: At [facility], you and your health needs are our priority.  As part of our continuing mission to provide you with exceptional heart care, we have created designated Provider Care Teams.  These Care Teams include your primary Cardiologist (physician) and Advanced Practice Providers (APPs -  Physician Assistants and Nurse Practitioners) who all work together to provide you with the care you need, when you need it.  We recommend signing up for the patient portal called "MyChart".  Sign up information is provided on this After Visit Summary.  MyChart is used to connect with patients for Virtual Visits (Telemedicine).  Patients are able to view lab/test results, encounter notes, upcoming appointments, etc.  Non-urgent messages can be sent to your provider as well.   To learn more about what you can do with MyChart, go to [email or web address].    Your next appointment:    October   Provider:   You may see [name], MD or one of the following Advanced Practice Providers on your designated Care Team:   Randall An, PA-C  Jacolyn Reedy, [place]     Other  Instructions Thank you for choosing [facility]!

## 2022-11-03 NOTE — Progress Notes (Signed)
Cardiology Office Note    Date:  11/03/2022  ID:  Michael [name], DOB July 20, 1960, MRN [id number] Cardiologist: [name], MD    History of Present Illness:    Michael [name] is a 62 y.o. male with past medical history of CAD (s/p STEMI in 03/2019 with occluded distal LAD and OM1 and spontaneous reperfusion and no PCI, s/p Inferior STEMI in 06/2022 with mid-PDA occlusion and unsuccessful PTCA), HFimpEF (EF 25-30% in 2021, at 50% by echo in 06/2022), sarcoidosis, HTN, HLD, Type 2 DM and prior tobacco use who presents to the office today for 15-month follow-up.  He was examined by myself in 07/2022 and reported having continual episodes of dizziness which could occur at rest or with activity and were not associated with positional changes. He had used Meclizine without improvement in symptoms. He denied any recent angina. Prior Head CT had shown no acute intracranial abnormalities. A Brain MRI and carotid dopplers were recommended for further evaluation. He was also undergoing hypercoagulable workup by Hematology. MRI Brain showed bilateral mastoid effusions but no evidence of acute intracranial abnormalities. He was referred to ENT given the effusions and persistent dizziness. Carotid Dopplers showed 50 to 69% RICA stenosis and was recommended to obtain follow-up imaging in 1 year.  In talking with the patient today, he reports still having persistent dizziness which occurs at rest or with activity and has been present for several years. He did not hear back in regards to prior referral for ENT and this was reentered today. He does report having shortness of breath at rest or with activity and feels like this is worse after taking Brilinta. Reports shortness of breath with this in the past and was previously on Plavix and tolerated well. He denies any specific exertional chest pain. No recent orthopnea, PND or pitting edema.  He is scheduled to see his PCP next week and will be having repeat labs at  that time.  Studies Reviewed:   EKG: EKG is not ordered today.   Cardiac Catheterization: 06/2022 Conclusions: Severe single vessel coronary artery disease with occlusion of mid rPDA.  Appearance is similar to distal LAD and LCx occlusions seen in 2020 and may reflect recurrent coronary embolism.  Mild RCA stenosis is similar to prior catheterization in 2021. Mildly elevated left ventricular filling pressure (LVEDP 20 mmHg). Attempted PTCA to rPDA occlusion; flow could not be reestablished despite multiple inflations with a 2.0 x 12 mm balloon.   Recommendations: Tirofiban infusion for 18 hours. Titrate nitroglycerin infusion for relief of chest pain. Dual antiplatelet therapy with aspirin and ticagrelor for at least 12 months. Obtain echocardiogram. Consider hypercoagulable workup and TEE/ILR to assess for potential causes of recurrent coronary occlusions suspicious for emboli. Aggressive secondary prevention of coronary artery disease.  Echocardiogram: 06/2022 IMPRESSIONS     1. Left ventricular ejection fraction, by estimation, is 50%. The left  ventricle has low normal function. The left ventricle demonstrates  regional wall motion abnormalities (see scoring diagram/findings for  description). There is mild asymmetric left  ventricular hypertrophy of the septal segment. Left ventricular diastolic  parameters were grossly normal.   2. Right ventricular systolic function is normal. The right ventricular  size is normal. Tricuspid regurgitation signal is inadequate for assessing  PA pressure.   3. The mitral valve is normal in structure. Trivial mitral valve  regurgitation. No evidence of mitral stenosis.   4. The aortic valve is tricuspid. There is mild thickening of the aortic  valve. Aortic valve  regurgitation is not visualized. No aortic stenosis is  present.   5. The inferior vena cava is normal in size with greater than 50%  respiratory variability, suggesting right  atrial pressure of 3 mmHg.    Physical Exam:   VS:  BP 130/80   Pulse 88   Ht 6\' 2"  (1.88 m)   Wt 245 lb 6.4 oz (111.3 kg)   SpO2 99%   BMI 31.51 kg/m    Wt Readings from Last 3 Encounters:  11/03/22 245 lb 6.4 oz (111.3 kg)  10/20/22 252 lb (114.3 kg)  09/11/22 240 lb 4.8 oz (109 kg)     GEN: Well nourished, well developed male appearing in no acute distress NECK: No JVD; No carotid bruits CARDIAC: RRR, no murmurs, rubs, gallops RESPIRATORY:  Clear to auscultation without rales, wheezing or rhonchi  ABDOMEN: Appears non-distended. No obvious abdominal masses. EXTREMITIES: No clubbing or cyanosis. No pitting edema.  Distal pedal pulses are 2+ bilaterally.   Assessment and Plan:   1. CAD - He has a history of cardioembolic events with most recent being inferior STEMI in 06/2022 with unsuccessful PTCA of the mid PDA at that time. Was previously referred to Hematology for hypercoagulable workup but did not keep follow-up to review results.  - He does report shortness of breath since being back on Brilinta and by review of notes had this in the past. Will stop Brilinta and switch to Plavix with a 300 mg load followed by 75 mg daily. Continue ASA 81 mg daily, Repatha, Zetia, Crestor and Toprol-XL. Will refer back to Hematology for completion of his hypercoagulable workup. As discussed during his admission, could consider TEE +/- ILR in the future pending results of this.    2. HFimpEF - His ejection fraction was previously at 25 to 30% by echocardiogram in 2021, at 50% by most recent imaging in 06/2022. He does report shortness of breath but no specific orthopnea, PND or pitting edema. Has a prescription for Lasix but does not have to utilize this routinely. Will plan to switch Brilinta to Plavix as discussed above to see if this helps with the shortness of breath. If symptoms persist, would plan to obtain a follow-up echocardiogram.   3. HTN - BP is well-controlled at 130/80 during  today's visit. Continue current medical therapy with Amlodipine 5 mg daily and Toprol-XL 12.5 mg daily.  4. HLD - Enrolled in the EVOLVE study and currently on Repatha, Zetia 10 mg daily and Crestor 20 mg daily. Will ask that a repeat FLP be obtained at the time of his office visit with his PCP next week.  5. Carotid Artery Stenosis - Dopplers in 08/2022 showed 50 to 69% RICA stenosis and a moderate amount of left-sided atherosclerotic plaque but no hemodynamically significant stenosis. Plan for repeat imaging in 1 year. Continue ASA, Zetia, Crestor and Repatha.  6. Stage 3 CKD - Baseline creatinine 1.4-1.6. Peaked at 1.92 during his admission 06/2022 but had improved to 1.64 at the time of discharge. He is scheduled for follow-up labs next week.  7.  Dizziness - This has been a recurrent issue for the patient over the past several years and prior Brain MRI showed bilateral mastoid effusions. He was previously referred to ENT to see if this could be contributing to his dizziness but has not heard back.  A referral will be entered again today.  Signed, Ellsworth Lennox, PA-C

## 2022-11-09 DIAGNOSIS — I214 Non-ST elevation (NSTEMI) myocardial infarction: Secondary | ICD-10-CM

## 2022-11-09 HISTORY — DX: Non-ST elevation (NSTEMI) myocardial infarction: I21.4

## 2022-11-12 ENCOUNTER — Other Ambulatory Visit: Payer: Self-pay | Admitting: Pulmonary Disease

## 2022-12-14 NOTE — Progress Notes (Signed)
I saw Michael [name] in neurology clinic on 12/21/22 in follow up for numbness, burning pain in feet and hands.  HPI: Michael [name] is a 62 y.o. year old male with a history of  pre-diabetes, CAD s/p MI x2, HLD, HTN, lumbosacral spine disease s/p 2 back surgeries, sarcoidosis, anxiety, depression, chronic pain who we last saw on 06/01/22.  To briefly review: Patient has muscles spasms, diffuse numbness, burning/needle pains in feet and hands (mostly in left hand). This has been going on in the feet for about 10 years. His hand symptoms have been present for the last few years. He gets cramps throughout the body. Pickle juice helps. He takes flexeril for muscle spasms. He takes Lyrica for 200 mg TID. He sees pain management, but patient has not gotten good relief to date. Patient is not sure what is behind his pain. Patient feels like his symptoms started after his first back surgery. He originally had back surgery due to back pain, but never feels like things improved after.   Patient feels like symptoms are similar on both sides, but maybe worse on the right.   He has significant back and neck pain.   Patient was seen by [facility] previously. The notes mention cervical radiculopathy and/or carpal tunnel syndrome, but not a lot of information. He has had right shoulder surgery, both hips have had surgery, and 2 back surgeries (L4-L5 per patient).   Patient is on fluoxetine.   He report any constitutional symptoms like fever, night sweats, anorexia or unintentional weight loss.   EtOH use: None  Restrictive diet? No Family history of neuropathy/myopathy/neurologic disease? None known  Most recent Assessment and Plan (06/01/22): Michael [name] is a 62 y.o. male who presents for evaluation of numbness and tingling in legs and hands and diffuse pain. He has a relevant medical history of pre-diabetes, CAD s/p MI x2, HLD, HTN, lumbosacral spine disease s/p 2 back surgeries, sarcoidosis,  anxiety, depression, chronic pain. His neurological examination is pertinent for distal symmetric sensory loss. Available diagnostic data is significant for HbA1c of 5.6 in 2022. Patient's symptoms are most consistent with a distal symmetric sensory predominant polyneuropathy. There are likely contributions from radiculopathy as well. He has no clear risk factors for neuropathy that have been identified though. I will send labs for treatable causes and get EMG to further evaluate.   PLAN: -Blood work: HbA1c, B12, B1, IFE -EMG: PN (R > L) -Pain control per pain management for now - continue Lyrica for now  Since their last visit: Lab work was significant for borderline B12 and HbA1c of 6.3 (known pre-DM). I recommended B12 1000 mcg daily. Patient takes the B12 as needed.   EMG on 06/26/22 showed the residuals of an old L5 radiculopathy (mild in degree electrically), but no evidence of a large fiber neuropathy or right cervical radiculopathy. I wanted patient to get an MRI of cervical and lumbar spine, but we were unable to reach patient by phone. A letter was sent to patient. Patient states he did not get any of these. He does have neck pain.  Patient continues to have constant pain in the left arm. He sees pain management who gives him oxycodone 10 mg QID which does not even touch the pain. This is his primary concern and not interested in talking about his other symptoms.    MEDICATIONS:  Outpatient Encounter Medications as of 12/21/2022  Medication Sig   albuterol (PROVENTIL) (2.5 MG/3ML) 0.083% nebulizer solution  Take 2.5 mg by nebulization every 6 (six) hours as needed.   albuterol (VENTOLIN HFA) 108 (90 Base) MCG/ACT inhaler Inhale 2 puffs into the lungs every 6 (six) hours as needed for wheezing or shortness of breath.   amLODipine (NORVASC) 5 MG tablet TAKE ONE TABLET BY MOUTH DAILY (MORNING)   ARIPiprazole (ABILIFY) 10 MG tablet Take 10 mg by mouth daily.   aspirin EC 81 MG tablet Take 1  tablet (81 mg total) by mouth daily. Swallow whole.   budesonide-formoterol (SYMBICORT) 160-4.5 MCG/ACT inhaler Inhale 2 puffs into the lungs 2 (two) times daily.   Cholecalciferol 1.25 MG (50000 UT) capsule Take 50,000 Units by mouth daily.   clopidogrel (PLAVIX) 75 MG tablet Take 300 mg (4 Tablets) on day one and then take 75 mg (1 Tablet) daily starting on day two.   cyclobenzaprine (FLEXERIL) 10 MG tablet Take 1 tablet by mouth at bedtime as needed.   FLUoxetine (PROZAC) 20 MG capsule Take 20 mg by mouth every morning.   furosemide (LASIX) 20 MG tablet Take 1 tablet (20 mg total) by mouth daily as needed.   meclizine (ANTIVERT) 25 MG tablet Take 25 mg by mouth 3 (three) times daily. Medication on list from [facility] patient brought with him. He stated he was taking TID.   metformin (FORTAMET) 500 MG (OSM) 24 hr tablet Take 500 mg by mouth 1 day or 1 dose.   methocarbamol (ROBAXIN) 750 MG tablet Take 750 mg by mouth every 8 (eight) hours.   metoprolol succinate (TOPROL-XL) 25 MG 24 hr tablet Take 1/2 tablet (12.5 mg total) by mouth every morning.   Nebulizer MISC machine   nitroGLYCERIN (NITROSTAT) 0.4 MG SL tablet Place 1 tablet (0.4 mg total) under the tongue every 5 (five) minutes as needed for chest pain.   Oxycodone HCl 10 MG TABS Take 10 mg by mouth every 6 (six) hours.   pregabalin (LYRICA) 200 MG capsule Take 200 mg by mouth in the morning, at noon, and at bedtime.   rosuvastatin (CRESTOR) 40 MG tablet Take 1 tablet (40 mg total) by mouth at bedtime.   Study - EVOLVE-MI - evolocumab (REPATHA) 140 mg/mL SQ injection (PI-Stuckey) Inject 1 mL (140 mg total) into the skin every 14 (fourteen) days. For Investigational Use Only. Inject subcutaneously into abdomen, thigh, or upper arm every 14 days. Rotate injection sites and do not inject into areas where skin is tender, bruised, or red. Please contact [facility] for any questions or concerns regarding this medication.    trazodone (DESYREL) 300 MG tablet Take 300 mg by mouth at bedtime.   Vitamin D, Ergocalciferol, 50000 units CAPS Take 1 capsule by mouth once a week.   zolpidem (AMBIEN) 10 MG tablet Take 10 mg by mouth at bedtime as needed for sleep.   ezetimibe (ZETIA) 10 MG tablet Take 1 tablet (10 mg total) by mouth daily.   No facility-administered encounter medications on file as of 12/21/2022.    PAST MEDICAL HISTORY: Past Medical History:  Diagnosis Date   Allergic rhinitis    Arthritis    Chronic low back pain    follwed in Pain Clinic(Dr.Kirchmayer)   CKD (chronic kidney disease), stage II    Depression    Depression with anxiety    GERD (gastroesophageal reflux disease)    History of kidney stones    passed   HLD (hyperlipidemia) 03/23/2019   Insomnia    Migraines    NSTEMI (non-ST elevated myocardial infarction) (HCC)  x2   Sarcoidosis    Multisystem,pulmonary and hepatic (Dr.Rourk)Liver bx in 06 mildly postive AMA but on re-check normal   Sleep apnea    "tested, but said it wasnt bad enough for a machine".    PAST SURGICAL HISTORY: Past Surgical History:  Procedure Laterality Date   BACK SURGERY  2012   2nd back surgery 3 monthes ago and in 2007   CARDIAC CATHETERIZATION  05/20/2019   COLONOSCOPY  11/05/2003   [id number] internal hemorrhoids, otherwise normal rectum and colon   CORONARY/GRAFT ACUTE MI REVASCULARIZATION N/A 03/20/2019   Procedure: Coronary/Graft Acute MI Revascularization;  Surgeon: Lennette Bihari, MD;  Location: [facility] INVASIVE CV LAB;  Service: Cardiovascular;  Laterality: N/A;   CORONARY/GRAFT ACUTE MI REVASCULARIZATION N/A 06/10/2022   Procedure: Coronary/Graft Acute MI Revascularization;  Surgeon: Yvonne Kendall, MD;  Location: [facility] INVASIVE CV LAB;  Service: Cardiovascular;  Laterality: N/A;   ESOPHAGOGASTRODUODENOSCOPY  03/22/2004   [id number] esophagogastroduodenoscopy. Small hiatal hernia otherwise normal stomach   HARVEST BONE GRAFT Left    thigh    HIP ARTHROPLASTY Bilateral    left 2017, right 2016   HIP SURGERY Left 2017   bone graft   LAMINECTOMY  2009   Balateral L4-L5   LEFT HEART CATH AND CORONARY ANGIOGRAPHY N/A 03/20/2019   Procedure: LEFT HEART CATH AND CORONARY ANGIOGRAPHY;  Surgeon: Lennette Bihari, MD;  Location: [facility] INVASIVE CV LAB;  Service: Cardiovascular;  Laterality: N/A;   LEFT HEART CATH AND CORONARY ANGIOGRAPHY N/A 05/20/2019   Procedure: LEFT HEART CATH AND CORONARY ANGIOGRAPHY;  Surgeon: Marykay Lex, MD;  Location: [facility] INVASIVE CV LAB;  Service: Cardiovascular;  Laterality: N/A;   LEFT HEART CATH AND CORONARY ANGIOGRAPHY N/A 06/10/2022   Procedure: LEFT HEART CATH AND CORONARY ANGIOGRAPHY;  Surgeon: Yvonne Kendall, MD;  Location: [facility] INVASIVE CV LAB;  Service: Cardiovascular;  Laterality: N/A;   RESECTION DISTAL CLAVICAL Right 01/23/2019   Procedure: OPEN DISTAL CLAVICLE EXCISION;  Surgeon: Vickki Hearing, MD;  Location: [facility]S;  Service: Orthopedics;  Laterality: Right;   TOOTH EXTRACTION Bilateral 12/28/2017   Procedure: DENTAL RESTORATION/EXTRACTIONS;  Surgeon: Ocie Doyne, DDS;  Location: [facility] OR;  Service: Oral Surgery;  Laterality: Bilateral;    ALLERGIES: Allergies  Allergen Reactions   Fentanyl Rash    Other Reaction(s): Dizziness   Methadone Other (See Comments)    REACTION: "Become mentally unstable"   Brilinta [Ticagrelor]     Shortness of Breath    FAMILY HISTORY: Family History  Problem Relation Age of Onset   Colon cancer Brother        in his 10s   Heart failure Mother     SOCIAL HISTORY: Social History   Tobacco Use   Smoking status: Every Day    Current packs/day: 0.50    Average packs/day: 0.5 packs/day for 29.0 years (14.5 ttl pk-yrs)    Types: Cigarettes   Smokeless tobacco: Never   Tobacco comments:    Smokes 1/2 pack per day--09/20/20/ 3-4 cigs a day 06/01/22  Vaping Use   Vaping status: Never Used  Substance Use Topics   Alcohol use: Not Currently   Drug use: No    Social History   Social History Narrative   Are you right handed or left handed? left   Are you currently employed ?    What is your current occupation? disability   Do you live at home alone?yes   Who lives with you?    What type of home do you  live in: 1 story or 2 story? one   Caffeine 1 soda a day     Objective:  Vital Signs:  BP (!) 139/98   Pulse 96   Ht 6\' 2"  (1.88 m)   Wt 251 lb (113.9 kg)   SpO2 98%   BMI 32.23 kg/m   General: General appearance: Awake and alert. No distress. Cooperative with exam.  Skin: No obvious rash or jaundice. HEENT: Atraumatic. Anicteric. Lungs: Non-labored breathing on room air  Extremities: No edema.   Neurological: Mental Status: Alert. Speech fluent. No pseudobulbar affect Cranial Nerves: CNII: No RAPD. Visual fields intact. CNIII, IV, VI: PERRL. No nystagmus. EOMI. CN V: Facial sensation intact bilaterally to fine touch. CN VII: Facial muscles symmetric and strong. No ptosis at rest. CN VIII: Hears finger rub well bilaterally. CN IX: No hypophonia. CN X: Palate elevates symmetrically. CN XI: Full strength shoulder shrug bilaterally. CN XII: Tongue protrusion full and midline. No atrophy or fasciculations. No significant dysarthria Motor: Tone is normal. Strength is 5/5 in bilateral upper and lower extremities Reflexes:  Right Left  Bicep 2+ 2+  Tricep 2+ 2+  BrRad 2+ 2+  Knee 2+ 2+  Ankle 1+ 1+   Sensation: Pinprick: Intact except: Diminished diffusely in LUE Coordination: Intact finger-to- nose-finger bilaterally. Gait: Normal, narrow-based gait   Lab and Test Review: New results: 06/01/22: HbA1c: 6.3 B12: 365 B1 wnl IFE: No M protein  EMG (06/26/22): NCV & EMG Findings: Extensive electrodiagnostic evaluation of the right upper and lower limbs with additional nerve conduction studies of the left upper limb shows: Right sural, superficial peroneal, median, ulnar, and radial sensory responses are within  normal limits. Bilateral ulnar (ADM) motor responses show reduced amplitude (L5.9, R6.1 mV). Right peroneal/fibular (EDB), tibial (AH), and median (APB) are within normal limits. Chronic motor axon loss changes without accompanying active denervation changes are seen in the right tibialis anterior and gluteus medius muscles. All other tested muscles are within normal limits with normal motor unit configuration and recruitment patterns.   Impression: This is an abnormal study. The findings are most consistent with the following: The residuals of an old intraspinal canal lesion (ie: motor radiculopathy) at the right L5 root, mild in degree electrically. No electrodiagnostic evidence of a large fiber sensorimotor polyneuropathy. No electrodiagnostic evidence of a right cervical (C5-C8) motor radiculopathy. Reduced bilateral ulnar (ADM) motor responses are of unclear clinical significance given normal needle examination of ulnar innervated muscles and may be technical in nature. The findings are too limited in degree and distribution for definitive diagnosis.  Previously reviewed results: External labs: 04/12/22: CBC unremarkable BMP significant for Cr 1.64   TSH (09/15/20): 0.676 A1c (05/05/20): 5.6   Imaging: CT head and cervical spine wo contrast (10/17/16): FINDINGS: CT HEAD FINDINGS   Brain: No evidence of acute infarction, hemorrhage, hydrocephalus, extra-axial collection or mass lesion/mass effect.   Vascular: No hyperdense vessel or unexpected calcification.   Skull: No osseous abnormality.   Sinuses/Orbits: Visualized paranasal sinuses are clear. Visualized mastoid sinuses are clear. Visualized orbits demonstrate no focal abnormality.   Other: None   CT CERVICAL SPINE FINDINGS   Alignment: Normal.   Skull base and vertebrae: No acute fracture. No primary bone lesion or focal pathologic process.   Soft tissues and spinal canal: No prevertebral fluid or swelling. No visible  canal hematoma.   Disc levels: Disc spaces are maintained. Broad central disc protrusion at C5-6.   Upper chest: Lung apices are clear.   Other:  No fluid collection or hematoma.   IMPRESSION: 1. No acute intracranial pathology. 2. No acute osseous injury of the cervical spine. 3. Broad central disc protrusion at C5-6.  ASSESSMENT: This is NOCHUM AKRIDGE, a 62 y.o. male with chronic pain, particularly in left arm today. The pain is the entire arm distal to the level of the deltoid muscle. There is no obvious weakness or abnormal reflexes, but subjective sensory loss. EMG completed in 06/2022 looking for peripheral neuropathy given different concerns at appointment on 06/01/22 showed only the residuals of an old right L5 radiculopathy. LUE was not extensively evaluated in that EMG. He is not in favor of further EMG. He has significant neck pain, so cervical radiculopathy on the left is possible. Patient was in agreement to get MRI of his cervical spine.  Plan: -MRI cervical spine wo contrast -Patient to continue with pain management for opioid management. -Continue Lyrica 200 mg TID  Return to clinic in 3 months  Jacquelyne Balint, MD

## 2022-12-21 ENCOUNTER — Encounter: Payer: Self-pay | Admitting: Neurology

## 2022-12-21 ENCOUNTER — Ambulatory Visit (INDEPENDENT_AMBULATORY_CARE_PROVIDER_SITE_OTHER): Payer: 59 | Admitting: Neurology

## 2022-12-21 VITALS — BP 139/98 | HR 96 | Ht 74.0 in | Wt 251.0 lb

## 2022-12-21 DIAGNOSIS — G629 Polyneuropathy, unspecified: Secondary | ICD-10-CM | POA: Diagnosis not present

## 2022-12-21 DIAGNOSIS — M79602 Pain in left arm: Secondary | ICD-10-CM

## 2022-12-21 DIAGNOSIS — G894 Chronic pain syndrome: Secondary | ICD-10-CM

## 2022-12-21 DIAGNOSIS — R209 Unspecified disturbances of skin sensation: Secondary | ICD-10-CM | POA: Diagnosis not present

## 2022-12-21 NOTE — Patient Instructions (Signed)
A referral to [facility] has been placed for your MRI neck (cervical spine) someone will contact you directly to schedule your appt. They are located at [address] [facility]. Please contact them directly by calling [phone] with any questions regarding your referral.    Follow up with pain management about the pain control.  Follow up with me in 3 months.  The physicians and staff at [facility] are committed to providing excellent care. You may receive a survey requesting feedback about your experience at our office. We strive to receive "very good" responses to the survey questions. If you feel that your experience would prevent you from giving the office a "very good " response, please contact our office to try to remedy the situation. We may be reached at [phone]. Thank you for taking the time out of your busy day to complete the survey.  Jacquelyne Balint, MD [facility]

## 2022-12-28 ENCOUNTER — Other Ambulatory Visit: Payer: Self-pay

## 2022-12-30 ENCOUNTER — Encounter (HOSPITAL_COMMUNITY): Payer: Self-pay

## 2023-01-01 ENCOUNTER — Other Ambulatory Visit (HOSPITAL_COMMUNITY): Payer: Self-pay | Admitting: Cardiology

## 2023-01-01 ENCOUNTER — Encounter: Payer: Self-pay | Admitting: *Deleted

## 2023-01-01 ENCOUNTER — Other Ambulatory Visit (HOSPITAL_COMMUNITY): Payer: Self-pay

## 2023-01-01 DIAGNOSIS — Z006 Encounter for examination for normal comparison and control in clinical research program: Secondary | ICD-10-CM

## 2023-01-01 MED ORDER — REPATHA SURECLICK 140 MG/ML ~~LOC~~ SOAJ
140.0000 mg | SUBCUTANEOUS | 2 refills | Status: DC
  Filled 2023-01-01 – 2023-05-02 (×3): qty 12, 168d supply, fill #0
  Filled 2023-10-08: qty 12, 30d supply, fill #1
  Filled 2023-10-10 – 2023-11-27 (×3): qty 12, 168d supply, fill #1

## 2023-01-01 NOTE — Research (Signed)
24 week Follow-Up Visit Completed*   []  Not Necessary, No Potential Adverse Events Or Medication Issues Reported On Completed Subject Questionnaire   [x]  Yes, Contact With Subject/Alternate Contact Completed   []  Yes, No Contact With Subject/Alternate Contact Completed, But Electronic Health Record Was Reviewed   []  No, Unable To Contact Subject/Alternate Contact   Have you reviewed Ongoing medications on the Targeted Concomitant Medication form and updated the form as needed?   [x]  Yes   []  No   Subject Status*   [x]  Continuing In Follow-up   []  At Risk For Lost To Follow-up   []  Withdrawal From All Future Study Activities Including Passive Follow-up By Electronic Health Record Review Or Contact With Healthcare Provider Or Family Member/Friend   []  Death   Vital Status*   [x]  Alive   []  Deceased   []  Unknown   Last Known To Be Alive Source*   [x]  Subject Completed Follow-up Questionnaire/Seen In Person/Via Telephone Contact   []  Family Member or Caretaker   []  Primary Physician Or Medical Records   []  Publicly Available Source   []  Other  Date of last dose taken   26-Dec-2022  Over the last 12 weeks did the subject miss any doses?n/a  Over the last 12 weeks did the subject restart Evolocumab after an interruption?n/a

## 2023-01-02 ENCOUNTER — Other Ambulatory Visit (HOSPITAL_COMMUNITY): Payer: Self-pay

## 2023-01-02 ENCOUNTER — Other Ambulatory Visit: Payer: Self-pay

## 2023-01-03 ENCOUNTER — Other Ambulatory Visit: Payer: Self-pay

## 2023-01-03 ENCOUNTER — Other Ambulatory Visit (HOSPITAL_COMMUNITY): Payer: Self-pay

## 2023-01-05 ENCOUNTER — Other Ambulatory Visit: Payer: Self-pay

## 2023-01-09 ENCOUNTER — Other Ambulatory Visit: Payer: Self-pay

## 2023-01-12 ENCOUNTER — Other Ambulatory Visit: Payer: Self-pay

## 2023-01-15 ENCOUNTER — Ambulatory Visit: Payer: 59 | Attending: Cardiology | Admitting: Cardiology

## 2023-01-15 ENCOUNTER — Encounter: Payer: Self-pay | Admitting: Cardiology

## 2023-01-15 VITALS — BP 138/90 | HR 92 | Ht 74.0 in | Wt 255.2 lb

## 2023-01-15 DIAGNOSIS — I10 Essential (primary) hypertension: Secondary | ICD-10-CM | POA: Diagnosis not present

## 2023-01-15 DIAGNOSIS — I2510 Atherosclerotic heart disease of native coronary artery without angina pectoris: Secondary | ICD-10-CM

## 2023-01-15 MED ORDER — AMLODIPINE BESYLATE 10 MG PO TABS: 10.0000 mg | ORAL_TABLET | Freq: Every day | ORAL | 6 refills | Status: DC

## 2023-01-15 NOTE — Patient Instructions (Signed)
Medication Instructions:   Increase Norvasc to 10mg  daily  Continue all other medications.     Labwork:  none  Testing/Procedures:  none  Follow-Up:  3 months   Any Other Special Instructions Will Be Listed Below (If Applicable).   If you need a refill on your cardiac medications before your next appointment, please call your pharmacy.

## 2023-01-15 NOTE — Progress Notes (Addendum)
Clinical Summary Michael [name] is a 62 y.o.male seen today for follow up of the following medical problems.    1. History of embolic STEMI - admitted 03/2019 with STEMI - cath showed occluded distal LAD and OM1, probable emoblic occlusion. Spontaneous reprfusion of LAD, low level PTCA of distal LCX - 03/2019 echo LVEF 60-65%, normal RV - unclear source of emboli. No significaint finding by echo. No afib noted during admission     05/2019 admitted again with chest pain. Trop up to 5300 -  cath 05/2019 showed no significant disease. LV gram 30-35% - 05/2019 cardiac MRI: LVEF 56%, inferolateral hypokinesis with delayed enhancement consistent with scar.   - 06/2022 admission with inferior STEMI - 06/2022 echo LVEF 50% - cath with mid PDA occlusion, unsuccesful attempt at PTCA. Treated with aggrastat. Throught possible recurrent cardioembolic event - referred to hematology for hypercoag workup given multiple events - from 07/2022 heme note hypercoag workup was negative, though few additional tests ordered. He did not f/u     - no recent chest pains - compliant with meds   2. Dizziness - typically ocurrs with standing - reports adequate hydration. Does take muscle relaxer bid. Oxycodone only every few days.  - overall not that bothersome.        3. History of sarcoid - 05/2019 cardiac MRI without signs of cardiac involvement   4. Chronic pain - followed in pain clinic       5. Tobacco history - 25+ years history - has not had PFTs per his report   - PFTs ordered las visit but appears not completed.  - ongoing wheezing, SOB at times   - abnormal PFTs 08/2020 - . followed by Dr Craige Cotta,     6. CKD - followed by pcp   7. Hyperlipidemia - part of reseach study, started repatha as part of research study. Remains on crestor     8. Carotid stenosis - 08/2022 [place] with RICA 50-69%, LICA moderate plaque withotu significant stenosis   Past Medical History:  Diagnosis Date    Allergic rhinitis    Arthritis    Chronic low back pain    follwed in Pain Clinic(Dr.Kirchmayer)   CKD (chronic kidney disease), stage II    Depression    Depression with anxiety    GERD (gastroesophageal reflux disease)    History of kidney stones    passed   HLD (hyperlipidemia) 03/23/2019   Insomnia    Migraines    NSTEMI (non-ST elevated myocardial infarction) (HCC)    x2   Sarcoidosis    Multisystem,pulmonary and hepatic (Dr.Rourk)Liver bx in 06 mildly postive AMA but on re-check normal   Sleep apnea    "tested, but said it wasnt bad enough for a machine".     Allergies  Allergen Reactions   Fentanyl Rash    Other Reaction(s): Dizziness   Methadone Other (See Comments)    REACTION: "Become mentally unstable"   Brilinta [Ticagrelor]     Shortness of Breath     Current Outpatient Medications  Medication Sig Dispense Refill   albuterol (PROVENTIL) (2.5 MG/3ML) 0.083% nebulizer solution Take 2.5 mg by nebulization every 6 (six) hours as needed.     albuterol (VENTOLIN HFA) 108 (90 Base) MCG/ACT inhaler Inhale 2 puffs into the lungs every 6 (six) hours as needed for wheezing or shortness of breath. 8 g 5   ARIPiprazole (ABILIFY) 10 MG tablet Take 10 mg by mouth daily.  aspirin EC 81 MG tablet Take 1 tablet (81 mg total) by mouth daily. Swallow whole. 90 tablet 3   budesonide-formoterol (SYMBICORT) 160-4.5 MCG/ACT inhaler Inhale 2 puffs into the lungs 2 (two) times daily. 1 each 12   clopidogrel (PLAVIX) 75 MG tablet Take 300 mg (4 Tablets) on day one and then take 75 mg (1 Tablet) daily starting on day two. 94 tablet 3   cyanocobalamin (VITAMIN B12) 1000 MCG tablet Take 1,000 mcg by mouth daily.     cyclobenzaprine (FLEXERIL) 10 MG tablet Take 1 tablet by mouth at bedtime as needed.     Evolocumab (REPATHA SURECLICK) 140 MG/ML SOAJ Inject 140 mg into the skin every 14 (fourteen) days. Inject subcutaneously into abdomen, thigh, or upper arm every 14 days. Rotate injection  sites and do not inject into areas where skin is tender, bruised, or red. Please contact [facility] for any questions or concerns regarding this medication. 12 mL 2   fenofibrate (TRICOR) 145 MG tablet Take 145 mg by mouth daily.     FLUoxetine (PROZAC) 20 MG capsule Take 20 mg by mouth every morning.     furosemide (LASIX) 20 MG tablet Take 1 tablet (20 mg total) by mouth daily as needed. 90 tablet 3   JARDIANCE 10 MG TABS tablet Take 10 mg by mouth daily.     meclizine (ANTIVERT) 25 MG tablet Take 25 mg by mouth 3 (three) times daily. Medication on list from [facility] patient brought with him. He stated he was taking TID.     metformin (FORTAMET) 500 MG (OSM) 24 hr tablet Take 500 mg by mouth 1 day or 1 dose.     methocarbamol (ROBAXIN) 750 MG tablet Take 750 mg by mouth every 8 (eight) hours.     metoprolol succinate (TOPROL-XL) 25 MG 24 hr tablet Take 1/2 tablet (12.5 mg total) by mouth every morning. 90 tablet 3   Nebulizer MISC machine     nitroGLYCERIN (NITROSTAT) 0.4 MG SL tablet Place 1 tablet (0.4 mg total) under the tongue every 5 (five) minutes as needed for chest pain. 25 tablet 1   Oxycodone HCl 10 MG TABS Take 10 mg by mouth every 6 (six) hours.     pregabalin (LYRICA) 200 MG capsule Take 200 mg by mouth in the morning, at noon, and at bedtime.     rosuvastatin (CRESTOR) 40 MG tablet Take 1 tablet (40 mg total) by mouth at bedtime. 90 tablet 3   Study - EVOLVE-MI - evolocumab (REPATHA) 140 mg/mL SQ injection (PI-Stuckey) Inject 1 mL (140 mg total) into the skin every 14 (fourteen) days. For Investigational Use Only. Inject subcutaneously into abdomen, thigh, or upper arm every 14 days. Rotate injection sites and do not inject into areas where skin is tender, bruised, or red. Please contact [facility] for any questions or concerns regarding this medication. 12 mL 0   trazodone (DESYREL) 300 MG tablet Take 300 mg by mouth at bedtime.     Vitamin D,  Ergocalciferol, 50000 units CAPS Take 1 capsule by mouth once a week.     zolpidem (AMBIEN) 10 MG tablet Take 10 mg by mouth at bedtime as needed for sleep.     amLODipine (NORVASC) 10 MG tablet Take 1 tablet (10 mg total) by mouth daily. 30 tablet 6   Cholecalciferol 1.25 MG (50000 UT) capsule Take 50,000 Units by mouth daily. (Patient not taking: Reported on 01/15/2023)     ezetimibe (ZETIA) 10 MG  tablet Take 1 tablet (10 mg total) by mouth daily. (Patient not taking: Reported on 01/15/2023) 90 tablet 0   No current facility-administered medications for this visit.     Past Surgical History:  Procedure Laterality Date   BACK SURGERY  2012   2nd back surgery 3 monthes ago and in 2007   CARDIAC CATHETERIZATION  05/20/2019   COLONOSCOPY  11/05/2003   [id number] internal hemorrhoids, otherwise normal rectum and colon   CORONARY/GRAFT ACUTE MI REVASCULARIZATION N/A 03/20/2019   Procedure: Coronary/Graft Acute MI Revascularization;  Surgeon: Lennette Bihari, MD;  Location: [facility] INVASIVE CV LAB;  Service: Cardiovascular;  Laterality: N/A;   CORONARY/GRAFT ACUTE MI REVASCULARIZATION N/A 06/10/2022   Procedure: Coronary/Graft Acute MI Revascularization;  Surgeon: Yvonne Kendall, MD;  Location: [facility] INVASIVE CV LAB;  Service: Cardiovascular;  Laterality: N/A;   ESOPHAGOGASTRODUODENOSCOPY  03/22/2004   [id number]:[id number] esophagogastroduodenoscopy. Small hiatal hernia otherwise normal stomach   HARVEST BONE GRAFT Left    thigh   HIP ARTHROPLASTY Bilateral    left 2017, right 2016   HIP SURGERY Left 2017   bone graft   LAMINECTOMY  2009   Balateral L4-L5   LEFT HEART CATH AND CORONARY ANGIOGRAPHY N/A 03/20/2019   Procedure: LEFT HEART CATH AND CORONARY ANGIOGRAPHY;  Surgeon: Lennette Bihari, MD;  Location: [facility] INVASIVE CV LAB;  Service: Cardiovascular;  Laterality: N/A;   LEFT HEART CATH AND CORONARY ANGIOGRAPHY N/A 05/20/2019   Procedure: LEFT HEART CATH AND CORONARY ANGIOGRAPHY;  Surgeon: Marykay Lex,  MD;  Location: [facility] INVASIVE CV LAB;  Service: Cardiovascular;  Laterality: N/A;   LEFT HEART CATH AND CORONARY ANGIOGRAPHY N/A 06/10/2022   Procedure: LEFT HEART CATH AND CORONARY ANGIOGRAPHY;  Surgeon: Yvonne Kendall, MD;  Location: [facility] INVASIVE CV LAB;  Service: Cardiovascular;  Laterality: N/A;   RESECTION DISTAL CLAVICAL Right 01/23/2019   Procedure: OPEN DISTAL CLAVICLE EXCISION;  Surgeon: Vickki Hearing, MD;  Location: [facility] ORS;  Service: Orthopedics;  Laterality: Right;   TOOTH EXTRACTION Bilateral 12/28/2017   Procedure: DENTAL RESTORATION/EXTRACTIONS;  Surgeon: Ocie Doyne, DDS;  Location: [facility] OR;  Service: Oral Surgery;  Laterality: Bilateral;     Allergies  Allergen Reactions   Fentanyl Rash    Other Reaction(s): Dizziness   Methadone Other (See Comments)    REACTION: "Become mentally unstable"   Brilinta [Ticagrelor]     Shortness of Breath      Family History  Problem Relation Age of Onset   Colon cancer Brother        in his 90s   Heart failure Mother      Social History Mr. Summer reports that he has been smoking cigarettes. He has a 14.5 pack-year smoking history. He has never used smokeless tobacco. Mr. Krist reports that he does not currently use alcohol.   Review of Systems CONSTITUTIONAL: No weight loss, fever, chills, weakness or fatigue.  HEENT: Eyes: No visual loss, blurred vision, double vision or yellow sclerae.No hearing loss, sneezing, congestion, runny nose or sore throat.  SKIN: No rash or itching.  CARDIOVASCULAR: per hpi RESPIRATORY: No shortness of breath, cough or sputum.  GASTROINTESTINAL: No anorexia, nausea, vomiting or diarrhea. No abdominal pain or blood.  GENITOURINARY: No burning on urination, no polyuria NEUROLOGICAL: No headache, dizziness, syncope, paralysis, ataxia, numbness or tingling in the extremities. No change in bowel or bladder control.  MUSCULOSKELETAL: No muscle, back pain, joint pain or stiffness.  LYMPHATICS: No  enlarged nodes. No history of splenectomy.  PSYCHIATRIC: No history of  depression or anxiety.  ENDOCRINOLOGIC: No reports of sweating, cold or heat intolerance. No polyuria or polydipsia.  Marland Kitchen   Physical Examination Today's Vitals   01/15/23 1018 01/15/23 1021 01/15/23 1044  BP: (!) 158/90 (!) 158/98 (!) 138/90  Pulse: 92    SpO2: 98%    Weight: 255 lb 3.2 oz (115.8 kg)    Height: 6\' 2"  (1.88 m)     Body mass index is 32.77 kg/m.  Gen: resting comfortably, no acute distress HEENT: no scleral icterus, pupils equal round and reactive, no palptable cervical adenopathy,  CV: RRR, no mrg, no jvd Resp: Clear to auscultation bilaterally GI: abdomen is soft, non-tender, non-distended, normal bowel sounds, no hepatosplenomegaly MSK: extremities are warm, no edema.  Skin: warm, no rash Neuro:  no focal deficits Psych: appropriate affect   Diagnostic Studies 03/2019 cath Dist LAD lesion is 100% stenosed. 1st Mrg lesion is 100% stenosed. The left ventricular systolic function is normal. The left ventricular ejection fraction is 50-55% by visual estimate. Post intervention, there is a 0% residual stenosis.   Probable embolic occlusion of the distal OM Harlynn Kimbell of the circumflex as well as distal LAD.   Normal RCA with superior takeoff.   Low normal global LV function with EF estimated 50 to 55% and focal area of mid anterolateral and focal apical hypocontractility.   Low level PTCA of the distal circumflex with improvement of flow from TIMI 0 to TIMI I in a small caliber distal vessel.   Spontaneous reperfusion of the LAD which ultimately wraps around the LV apex.   RECOMMENDATION: Aggrastat was started due to high suspect for embolic etiology rather than atherosclerotic plaque; continue for 18 hours post procedure.  DAPT with aspirin/Brilinta.  Smoking cessation is imperative.  We will plan for echo Doppler study in a.m.     03/2019 echo IMPRESSIONS      1. Left ventricular  ejection fraction, by visual estimation, is 60 to 65%. The left ventricle has normal function. There is mildly increased left ventricular hypertrophy.  2. The left ventricle has no regional wall motion abnormalities.  3. Global right ventricle has normal systolic function.The right ventricular size is normal. No increase in right ventricular wall thickness.  4. Left atrial size was normal.  5. Right atrial size was normal.  6. The mitral valve is normal in structure. No evidence of mitral valve regurgitation.  7. The tricuspid valve is normal in structure. Tricuspid valve regurgitation is not demonstrated.  8. The aortic valve is grossly normal. Aortic valve regurgitation is not visualized. No evidence of aortic valve sclerosis or stenosis.  9. The pulmonic valve was normal in structure. Pulmonic valve regurgitation is not visualized. 10. Technically difficult echo with poor image quality. 11. The atrial septum is grossly normal.     05/2019 cath There is moderate to severe left ventricular systolic dysfunction with regional and global hypokinesis. Ejection Fraction (EF) appears to be 25-35% by visual estimate. LV end diastolic pressure is mildly elevated. The previously occluded 1st Mrg treated with PTCA is widely patent. Previously occluded Dist LAD lesionis now widely patent Ost RCA lesion is 20% stenosed.   SUMMARY Previously occluded distal circumflex is widely patent as is the LAD.  Mild ostial RCA but otherwise normal, tortuous vessel. Dilated Left Ventricle with global hypokinesis worse in the inferior and anterior apex.  EF estimated 30 of 35%. Suspect NONISCHEMIC CARDIOMYOPATHY     RECOMMENDATIONS Recommend checking 2D echocardiogram for better assessment of EF and wall  motion. Evaluate for causes of Nonischemic Cardiomyopathy with elevated troponin -> consider myocarditis. Further plans per primary service -> would potentially consider cardiac MRI with concern for  myocarditis.     08/2019 ABIs Summary:  Right: Resting right ankle-brachial index is within normal range. No  evidence of significant right lower extremity arterial disease. The right  toe-brachial index is normal.   Left: Resting left ankle-brachial index is within normal range. No  evidence of significant left lower extremity arterial disease. The left  toe-brachial index is normal.   06/2022 echo:  1. Left ventricular ejection fraction, by estimation, is 50%. The left  ventricle has low normal function. The left ventricle demonstrates  regional wall motion abnormalities (see scoring diagram/findings for  description). There is mild asymmetric left  ventricular hypertrophy of the septal segment. Left ventricular diastolic  parameters were grossly normal.   2. Right ventricular systolic function is normal. The right ventricular  size is normal. Tricuspid regurgitation signal is inadequate for assessing  PA pressure.   3. The mitral valve is normal in structure. Trivial mitral valve  regurgitation. No evidence of mitral stenosis.   4. The aortic valve is tricuspid. There is mild thickening of the aortic  valve. Aortic valve regurgitation is not visualized. No aortic stenosis is  present.   5. The inferior vena cava is normal in size with greater than 50%  respiratory variability, suggesting right atrial pressure of 3 mmHg.    06/2022 cath Conclusions: Severe single vessel coronary artery disease with occlusion of mid rPDA.  Appearance is similar to distal LAD and LCx occlusions seen in 2020 and may reflect recurrent coronary embolism.  Mild RCA stenosis is similar to prior catheterization in 2021. Mildly elevated left ventricular filling pressure (LVEDP 20 mmHg). Attempted PTCA to rPDA occlusion; flow could not be reestablished despite multiple inflations with a 2.0 x 12 mm balloon.   Recommendations: Tirofiban infusion for 18 hours. Titrate nitroglycerin infusion for relief of  chest pain. Dual antiplatelet therapy with aspirin and ticagrelor for at least 12 months. Obtain echocardiogram. Consider hypercoagulable workup and TEE/ILR to assess for potential causes of recurrent coronary occlusions suspicious for emboli. Aggressive secondary prevention of coronary artery disease.   Assessment and Plan  1. History of STEMI - recurrent MIs look to be embolic, unclear source - reach out to hematology to get final interpretation of his hyperocag workup - if negative hypercoag workup would likely plan for TEE, possibly loop recorder.  - continue DAPT with recent MI     2. HTN - above goal, increase norvasc to 10mg  daily.    3. Hyperlipidemia - continue current meds, he is on high dose statin. Enrolled in research study has started repatha as well   F/u 3 months. Would arrange TEE if hypercoag workup unremakrable, will message Dr Ellin Saba.    Addendum From Dr Elbert Ewings: Per my review of labs from 07/19/2022, I did not see anything of concern.  Factor VIII levels were high but it is an acute phase reactant.  Elevated beta-2 microglobulin IgA is not significant.  So far all his hypercoagulable workup was negative.  You may proceed with TEE/loop recorder.    Antoine Poche, M.D.

## 2023-01-22 ENCOUNTER — Other Ambulatory Visit: Payer: Self-pay

## 2023-01-25 ENCOUNTER — Telehealth: Payer: Self-pay | Admitting: Cardiology

## 2023-01-25 NOTE — Telephone Encounter (Signed)
Patient states he is returning a call but he does not know who called or what it was regarding.

## 2023-01-25 NOTE — Telephone Encounter (Signed)
Unable to get patient on phone phone out of service it seems. It looks as if no one has called him from our office

## 2023-02-06 ENCOUNTER — Telehealth: Payer: Self-pay

## 2023-02-06 NOTE — Telephone Encounter (Signed)
-----   Message from [name] sent at 01/25/2023 10:36 AM EDT ----- Let patient know I heard back from his hematologist, most recent labs did not show a cause for the recurrent blood clots he has had. Id like to do a TEE to take a closer look at the heart to see if any clots are present if he is willing  Dominga Ferry MD

## 2023-02-06 NOTE — Telephone Encounter (Signed)
Unable to reach pt via telephone for TEE date/time. Letter mailed. Will attempt again.

## 2023-02-06 NOTE — Telephone Encounter (Signed)
Attempted to call patient no answer no voice mail.  

## 2023-02-06 NOTE — Telephone Encounter (Signed)
Spoke to pt who is agreeable with TEE. Left a message for scheduling office to call so procedure can be scheduled  DX: R/O Apical Thrombus/R/O LV Thrombus

## 2023-02-07 NOTE — Telephone Encounter (Signed)
Attempted to call patient no answer no voice mail.  

## 2023-02-09 NOTE — Telephone Encounter (Signed)
Attempted to call patient no answer no voice mail.  

## 2023-02-12 NOTE — Patient Instructions (Signed)
Michael [name]  02/12/2023     @PREFPERIOPPHARMACY @   Your procedure is scheduled on  02/15/2023.    Report to [facility] at  1100  A.M.   Call this number if you have problems the morning of surgery:  [phone]  If you experience any cold or flu symptoms such as cough, fever, chills, shortness of breath, etc. between now and your scheduled surgery, please notify us at the above number.   Remember:     Use your nebulizer and your inhalers before you come and bring your rescue inhaler with you.      Your last dose of jardiance should have been on 02/11/2023.       DO NOT take any medications for diabetes the morning of your procedure.   Do not eat after midnight.  You may drink clear liquids until 0900 am on 02/15/2023.    Clear liquids allowed are:                    Water, Carbonated beverages (diabetics please choose diet or no sugar options), Black Coffee Only (No creamer, milk or cream, including half & half and powdered creamer), and Clear Sports drink (No red color; diabetics please choose diet or no sugar options)     Take these medicines the morning of surgery with A SIP OF WATER        amlodipine, abilify, fluoxetine, antivert(if needed), robaxin (if needed), metoprolol, oxycodone (if needed), pregabalin.     Do not wear jewelry, make-up or nail polish, including gel polish,  artificial nails, or any other type of covering on natural nails (fingers and  toes).  Do not wear lotions, powders, or perfumes, or deodorant.  Do not shave 48 hours prior to surgery.  Men may shave face and neck.  Do not bring valuables to the hospital.  [facility] is not responsible for any belongings or valuables.  Contacts, dentures or bridgework may not be worn into surgery.  Leave your suitcase in the car.  After surgery it may be brought to your room.  For patients admitted to the hospital, discharge time will be determined by your treatment team.  Patients discharged the  day of surgery will not be allowed to drive home and must have someone with them for 24 hours.    Special instructions:   DO NOT smoke tobacco or vape for 24 hours before your procedure.  Please read over the following fact sheets that you were given. Anesthesia Post-op Instructions and Care and Recovery After Surgery       Transesophageal Echocardiogram Transesophageal echocardiogram (TEE) is a test that uses sound waves to take pictures of your heart. TEE is done by passing a small probe attached to a flexible tube down the part of the body that moves food from your mouth to your stomach (esophagus). The pictures give clear images of your heart. This can help your doctor see if there are problems with your heart. Tell a doctor about: Any allergies you have. All medicines you are taking. This includes vitamins, herbs, eye drops, creams, and over-the-counter medicines. Any problems you or family members have had with anesthetic medicines. Any blood disorders you have. Any surgeries you have had. Any medical conditions you have. Any swallowing problems. Whether you have or have had a blockage in the part of the body that moves food from your mouth to your stomach. Whether you are pregnant or may be pregnant.  What are the risks? In general, this is a safe procedure. But, problems may occur, such as: Damage to nearby structures or organs. A tear in the part of the body that moves food from your mouth to your stomach. Irregular heartbeat. Hoarse voice or trouble swallowing. Bleeding. What happens before the procedure? Medicines Ask your doctor about changing or stopping: Your normal medicines. Vitamins, herbs, and supplements. Over-the-counter medicines. Do not take aspirin or ibuprofen unless you are told to. General instructions Follow instructions from your doctor about what you cannot eat or drink. You will take out any dentures or dental retainers. Plan to have a responsible  adult take you home from the hospital or clinic. Plan to have a responsible adult care for you for the time you are told after you leave the hospital or clinic. This is important. What happens during the procedure?  An IV will be put into one of your veins. You may be given: A sedative. This medicine helps you relax. A medicine to numb the back of your throat. This may be sprayed or gargled. Your blood pressure, heart rate, and breathing will be watched. You may be asked to lie on your left side. A bite block will be placed in your mouth. This keeps you from biting the tube. The tip of the probe will be placed into the back of your mouth. You will be asked to swallow. Your doctor will take pictures of your heart. The probe and bite block will be taken out after the test is done. The procedure may vary among doctors and hospitals. What can I expect after the procedure? You will be monitored until you leave the hospital or clinic. This includes checking your blood pressure, heart rate, breathing rate, and blood oxygen level. Your throat may feel sore and numb. This will get better over time. You will not be allowed to eat or drink until the numbness has gone away. It is common to have a sore throat for a day or two. It is up to you to get the results of your procedure. Ask how to get your results when they are ready. Follow these instructions at home: If you were given a sedative during your procedure, do not drive or use machines until your doctor says that it is safe. Return to your normal activities when your doctor says that it is safe. Keep all follow-up visits. Summary TEE is a test that uses sound waves to take pictures of your heart. You will be given a medicine to help you relax. Do not drive or use machines until your doctor says that it is safe. This information is not intended to replace advice given to you by your health care provider. Make sure you discuss any questions you  have with your health care provider. Document Revised: 12/08/2020 Document Reviewed: 11/18/2019 Elsevier Patient Education  2024 [employer] Monitored Anesthesia Care, Care After The following information offers guidance on how to care for yourself after your procedure. Your health care provider may also give you more specific instructions. If you have problems or questions, contact your health care provider. What can I expect after the procedure? After the procedure, it is common to have: Tiredness. Little or no memory about what happened during or after the procedure. Impaired judgment when it comes to making decisions. Nausea or vomiting. Some trouble with balance. Follow these instructions at home: For the time period you were told by your health care provider:  Rest. Do not participate  in activities where you could fall or become injured. Do not drive or use machinery. Do not drink alcohol. Do not take sleeping pills or medicines that cause drowsiness. Do not make important decisions or sign legal documents. Do not take care of children on your own. Medicines Take over-the-counter and prescription medicines only as told by your health care provider. If you were prescribed antibiotics, take them as told by your health care provider. Do not stop using the antibiotic even if you start to feel better. Eating and drinking Follow instructions from your health care provider about what you may eat and drink. Drink enough fluid to keep your urine pale yellow. If you vomit: Drink clear fluids slowly and in small amounts as you are able. Clear fluids include water, ice chips, low-calorie sports drinks, and fruit juice that has water added to it (diluted fruit juice). Eat light and bland foods in small amounts as you are able. These foods include bananas, applesauce, rice, lean meats, toast, and crackers. General instructions  Have a responsible adult stay with you for the time you are  told. It is important to have someone help care for you until you are awake and alert. If you have sleep apnea, surgery and some medicines can increase your risk for breathing problems. Follow instructions from your health care provider about wearing your sleep device: When you are sleeping. This includes during daytime naps. While taking prescription pain medicines, sleeping medicines, or medicines that make you drowsy. Do not use any products that contain nicotine or tobacco. These products include cigarettes, chewing tobacco, and vaping devices, such as e-cigarettes. If you need help quitting, ask your health care provider. Contact a health care provider if: You feel nauseous or vomit every time you eat or drink. You feel light-headed. You are still sleepy or having trouble with balance after 24 hours. You get a rash. You have a fever. You have redness or swelling around the IV site. Get help right away if: You have trouble breathing. You have new confusion after you get home. These symptoms may be an emergency. Get help right away. Call 911. Do not wait to see if the symptoms will go away. Do not drive yourself to the hospital. This information is not intended to replace advice given to you by your health care provider. Make sure you discuss any questions you have with your health care provider. Document Revised: 08/22/2021 Document Reviewed: 08/22/2021 Elsevier Patient Education  2024 [employer].

## 2023-02-13 ENCOUNTER — Encounter (HOSPITAL_COMMUNITY): Payer: Self-pay

## 2023-02-13 ENCOUNTER — Other Ambulatory Visit: Payer: Self-pay | Admitting: Cardiology

## 2023-02-13 ENCOUNTER — Encounter (HOSPITAL_COMMUNITY)
Admission: RE | Admit: 2023-02-13 | Discharge: 2023-02-13 | Disposition: A | Discharge status: Home / Self Care | Payer: 59 | Source: Ambulatory Visit | Attending: Cardiology | Admitting: Cardiology

## 2023-02-13 DIAGNOSIS — N1831 Chronic kidney disease, stage 3a: Secondary | ICD-10-CM

## 2023-02-13 DIAGNOSIS — E118 Type 2 diabetes mellitus with unspecified complications: Secondary | ICD-10-CM

## 2023-02-13 DIAGNOSIS — F17200 Nicotine dependence, unspecified, uncomplicated: Secondary | ICD-10-CM

## 2023-02-13 DIAGNOSIS — I749 Embolism and thrombosis of unspecified artery: Secondary | ICD-10-CM

## 2023-02-13 NOTE — Pre-Procedure Instructions (Signed)
Dr Wyline Mood and Leonides Schanz, CMA messaged because patient did not show for pre-op this morning.

## 2023-02-14 ENCOUNTER — Other Ambulatory Visit: Payer: Self-pay

## 2023-02-14 ENCOUNTER — Other Ambulatory Visit: Payer: Self-pay | Admitting: Cardiology

## 2023-02-14 NOTE — Telephone Encounter (Signed)
Left a message for scheduler to cancel TEE.

## 2023-02-14 NOTE — Telephone Encounter (Signed)
Pt no showed scheduled pre-op appt for TEE with provider on 02/13/23. Per provider we will cancel TEE. Attempted to contact pt regarding cancellation- no answer, no voicemail.

## 2023-02-14 NOTE — Telephone Encounter (Signed)
TEE canceled

## 2023-02-15 ENCOUNTER — Encounter (HOSPITAL_COMMUNITY): Admission: RE | Payer: Self-pay | Source: Home / Self Care

## 2023-02-15 ENCOUNTER — Ambulatory Visit (HOSPITAL_COMMUNITY): Admission: RE | Admit: 2023-02-15 | Payer: 59 | Source: Home / Self Care | Admitting: Cardiology

## 2023-02-15 ENCOUNTER — Other Ambulatory Visit: Payer: Self-pay | Admitting: Student

## 2023-02-15 ENCOUNTER — Telehealth: Payer: Self-pay | Admitting: Cardiology

## 2023-02-15 SURGERY — TRANSESOPHAGEAL ECHOCARDIOGRAM (TEE)
Anesthesia: Monitor Anesthesia Care

## 2023-02-15 NOTE — Telephone Encounter (Signed)
Patient called to follow-up on next steps regarding his canceled test.

## 2023-02-20 NOTE — Telephone Encounter (Signed)
Attempted to contact pt- phone rang 2x then cut off. Attempted to contact pt a second time with no success.

## 2023-02-21 NOTE — Telephone Encounter (Signed)
Attempted to call patient- phone rang once then cut off. Attempted to contact patient a second time with same results.

## 2023-02-21 NOTE — Telephone Encounter (Signed)
Letter mailed to patient to call office to reschedule TEE with Dr. Wyline Mood.

## 2023-02-22 NOTE — Telephone Encounter (Signed)
Pt called in stating he has a lot going on in life right now and will c/b to r/s TEE at a later time.

## 2023-02-23 NOTE — Telephone Encounter (Signed)
Noted Will route to provider as FYI.

## 2023-03-01 ENCOUNTER — Other Ambulatory Visit: Payer: Self-pay

## 2023-03-06 ENCOUNTER — Other Ambulatory Visit: Payer: Self-pay

## 2023-03-12 ENCOUNTER — Other Ambulatory Visit: Payer: Self-pay

## 2023-03-15 ENCOUNTER — Encounter: Payer: Self-pay | Admitting: *Deleted

## 2023-03-15 DIAGNOSIS — Z006 Encounter for examination for normal comparison and control in clinical research program: Secondary | ICD-10-CM

## 2023-03-15 NOTE — Research (Signed)
36-Week Follow-Up Visit Completed*   []  Not Necessary, No Potential Adverse Events Or Medication Issues Reported On Completed Subject Questionnaire   [x]  Yes, Contact With Subject/Alternate Contact Completed   []  Yes, No Contact With Subject/Alternate Contact Completed, But Electronic Health Record Was Reviewed   []  No, Unable To Contact Subject/Alternate Contact   Have you reviewed Ongoing medications on the Targeted Concomitant Medication form and updated the form as needed?   [x]  Yes   []  No   Subject Status*   [x]  Continuing In Follow-up   []  At Risk For Lost To Follow-up   []  Withdrawal From All Future Study Activities Including Passive Follow-up By Electronic Health Record Review Or Contact With Healthcare Provider Or Family Member/Friend   []  Death   Vital Status*   [x]  Alive   []  Deceased   []  Unknown   Last Known To Be Alive Source*   [x]  Subject Completed Follow-up Questionnaire/Seen In Person/Via Telephone Contact   []  Family Member or Caretaker   []  Primary Physician Or Medical Records   []  Publicly Available Source   []  Other  Date of last dose taken   07-Mar-2023  Over the last 12 weeks did the subject miss any doses?n/a  Over the last 12 weeks did the subject restart Evolocumab after an interruption?n/a

## 2023-03-30 ENCOUNTER — Other Ambulatory Visit: Payer: Self-pay

## 2023-04-02 ENCOUNTER — Other Ambulatory Visit: Payer: Self-pay

## 2023-04-09 NOTE — Progress Notes (Signed)
 I saw Michael [name] in neurology clinic on 04/18/23 in follow up for numbness, burning pain in feet and hands.  HPI: Michael [name] is a 62 y.o. year old male with a history of pre-diabetes, CAD s/p MI x2, HLD, HTN, lumbosacral spine disease s/p 2 back surgeries, sarcoidosis, anxiety, depression, chronic pain who we last saw on 12/21/22.  To briefly review: 06/01/22: Patient has muscles spasms, diffuse numbness, burning/needle pains in feet and hands (mostly in left hand). This has been going on in the feet for about 10 years. His hand symptoms have been present for the last few years. He gets cramps throughout the body. Pickle juice helps. He takes flexeril for muscle spasms. He takes Lyrica  for 200 mg TID. He sees pain management, but patient has not gotten good relief to date. Patient is not sure what is behind his pain. Patient feels like his symptoms started after his first back surgery. He originally had back surgery due to back pain, but never feels like things improved after.   Patient feels like symptoms are similar on both sides, but maybe worse on the right.   He has significant back and neck pain.   Patient was seen by [facility] previously. The notes mention cervical radiculopathy and/or carpal tunnel syndrome, but not a lot of information. He has had right shoulder surgery, both hips have had surgery, and 2 back surgeries (L4-L5 per patient).   Patient is on fluoxetine .   He report any constitutional symptoms like fever, night sweats, anorexia or unintentional weight loss.   EtOH use: None  Restrictive diet? No Family history of neuropathy/myopathy/neurologic disease? None known  12/21/22: Lab work was significant for borderline B12 and HbA1c of 6.3 (known pre-DM). I recommended B12 1000 mcg daily. Patient takes the B12 as needed.    EMG on 06/26/22 showed the residuals of an old L5 radiculopathy (mild in degree electrically), but no evidence of a large fiber neuropathy or  right cervical radiculopathy. I wanted patient to get an MRI of cervical and lumbar spine, but we were unable to reach patient by phone. A letter was sent to patient. Patient states he did not get any of these. He does have neck pain.   Patient continues to have constant pain in the left arm. He sees pain management who gives him oxycodone  10 mg QID which does not even touch the pain. This is his primary concern and not interested in talking about his other symptoms.  Most recent Assessment and Plan (12/21/22): [name], a 62 y.o. male with chronic pain, particularly in left arm today. The pain is the entire arm distal to the level of the deltoid muscle. There is no obvious weakness or abnormal reflexes, but subjective sensory loss. EMG completed in 06/2022 looking for peripheral neuropathy given different concerns at appointment on 06/01/22 showed only the residuals of an old right L5 radiculopathy. LUE was not extensively evaluated in that EMG. He is not in favor of further EMG. He has significant neck pain, so cervical radiculopathy on the left is possible. Patient was in agreement to get MRI of his cervical spine.   Plan: -MRI cervical spine wo contrast -Patient to continue with pain management for opioid management. -Continue Lyrica  200 mg TID  Since their last visit: Patient continues to have significant pain in the left arm. He can use it, but it hurts. It is about the same as last visit. Patient did not get his MRI  cervical spine. He said he was never called about this.  He continues to see pain management and get oxycodone  and Lyrica  200 mg TID. This sometimes helps for a short period of time.   MEDICATIONS:  Outpatient Encounter Medications as of 04/18/2023  Medication Sig   albuterol  (PROVENTIL ) (2.5 MG/3ML) 0.083% nebulizer solution Take 2.5 mg by nebulization every 6 (six) hours as needed.   albuterol  (VENTOLIN  HFA) 108 (90 Base) MCG/ACT inhaler Inhale 2 puffs into the lungs  every 6 (six) hours as needed for wheezing or shortness of breath.   amLODipine  (NORVASC ) 10 MG tablet Take 1 tablet (10 mg total) by mouth daily.   ARIPiprazole  (ABILIFY ) 10 MG tablet Take 10 mg by mouth daily.   budesonide -formoterol  (SYMBICORT ) 160-4.5 MCG/ACT inhaler Inhale 2 puffs into the lungs 2 (two) times daily.   clopidogrel  (PLAVIX ) 75 MG tablet take 4 tablets on day 1 and then 1 tablet daily starting on day 2.   cyanocobalamin  (VITAMIN B12) 1000 MCG tablet Take 1,000 mcg by mouth daily.   cyclobenzaprine (FLEXERIL) 10 MG tablet Take 1 tablet by mouth at bedtime as needed.   Evolocumab  (REPATHA  SURECLICK) 140 MG/ML SOAJ Inject 140 mg into the skin every 14 (fourteen) days. Inject subcutaneously into abdomen, thigh, or upper arm every 14 days. Rotate injection sites and do not inject into areas where skin is tender, bruised, or red. Please contact [facility] for any questions or concerns regarding this medication.   fenofibrate  (TRICOR ) 145 MG tablet Take 145 mg by mouth daily.   FLUoxetine  (PROZAC ) 20 MG capsule Take 20 mg by mouth every morning.   furosemide  (LASIX ) 20 MG tablet Take 1 tablet (20 mg total) by mouth daily as needed.   JARDIANCE  10 MG TABS tablet Take 10 mg by mouth daily.   meclizine (ANTIVERT) 25 MG tablet Take 25 mg by mouth 3 (three) times daily. Medication on list from [facility] patient brought with him. He stated he was taking TID.   metformin (FORTAMET) 500 MG (OSM) 24 hr tablet Take 500 mg by mouth 1 day or 1 dose.   methocarbamol  (ROBAXIN ) 750 MG tablet Take 750 mg by mouth every 8 (eight) hours.   metoprolol  succinate (TOPROL -XL) 25 MG 24 hr tablet Take 1/2 tablet (12.5 mg total) by mouth every morning.   Nebulizer MISC machine   nitroGLYCERIN  (NITROSTAT ) 0.4 MG SL tablet Place 1 tablet (0.4 mg total) under the tongue every 5 (five) minutes as needed for chest pain.   Oxycodone  HCl 10 MG TABS Take 10 mg by mouth every 6 (six) hours.    pregabalin  (LYRICA ) 200 MG capsule Take 200 mg by mouth in the morning, at noon, and at bedtime.   rosuvastatin  (CRESTOR ) 40 MG tablet take 1 tablet by mouth at bedtime for high cholesterol   Study - EVOLVE-MI - evolocumab  (REPATHA ) 140 mg/mL SQ injection (PI-Stuckey) Inject 1 mL (140 mg total) into the skin every 14 (fourteen) days. For Investigational Use Only. Inject subcutaneously into abdomen, thigh, or upper arm every 14 days. Rotate injection sites and do not inject into areas where skin is tender, bruised, or red. Please contact [facility] for any questions or concerns regarding this medication.   trazodone  (DESYREL ) 300 MG tablet Take 300 mg by mouth at bedtime.   zolpidem  (AMBIEN ) 10 MG tablet Take 10 mg by mouth at bedtime as needed for sleep.   aspirin  EC 81 MG tablet Take 1 tablet (81 mg total) by mouth  daily. Swallow whole. (Patient not taking: Reported on 04/18/2023)   Cholecalciferol 1.25 MG (50000 UT) capsule Take 50,000 Units by mouth daily. (Patient not taking: Reported on 04/18/2023)   ezetimibe  (ZETIA ) 10 MG tablet Take 1 tablet (10 mg total) by mouth daily. (Patient not taking: Reported on 01/15/2023)   Vitamin D, Ergocalciferol, 50000 units CAPS Take 1 capsule by mouth once a week. (Patient not taking: Reported on 04/18/2023)   No facility-administered encounter medications on file as of 04/18/2023.    PAST MEDICAL HISTORY: Past Medical History:  Diagnosis Date   Allergic rhinitis    Arthritis    Chronic low back pain    follwed in Pain Clinic(Dr.Kirchmayer)   CKD (chronic kidney disease), stage II    Depression    Depression with anxiety    GERD (gastroesophageal reflux disease)    History of kidney stones    passed   HLD (hyperlipidemia) 03/23/2019   Insomnia    Migraines    NSTEMI (non-ST elevated myocardial infarction) (HCC)    x2   Sarcoidosis    Multisystem,pulmonary and hepatic (Dr.Rourk)Liver bx in 06 mildly postive AMA but on re-check normal    Sleep apnea    tested, but said it wasnt bad enough for a machine.    PAST SURGICAL HISTORY: Past Surgical History:  Procedure Laterality Date   BACK SURGERY  2012   2nd back surgery 3 monthes ago and in 2007   CARDIAC CATHETERIZATION  05/20/2019   COLONOSCOPY  11/05/2003   MFM:Fpwpfjo internal hemorrhoids, otherwise normal rectum and colon   CORONARY/GRAFT ACUTE MI REVASCULARIZATION N/A 03/20/2019   Procedure: Coronary/Graft Acute MI Revascularization;  Surgeon: Burnard Debby LABOR, MD;  Location[facility] INVASIVE CV LAB;  Service: Cardiovascular;  Laterality: N/A;   CORONARY/GRAFT ACUTE MI REVASCULARIZATION N/A 06/10/2022   Procedure: Coronary/Graft Acute MI Revascularization;  Surgeon: Mady Bruckner, MD;  Location: [facility] INVASIVE CV LAB;  Service: Cardiovascular;  Laterality: N/A;   ESOPHAGOGASTRODUODENOSCOPY  03/22/2004   MFM:Wnmfjo esophagogastroduodenoscopy. Small hiatal hernia otherwise normal stomach   HARVEST BONE GRAFT Left    thigh   HIP ARTHROPLASTY Bilateral    left 2017, right 2016   HIP SURGERY Left 2017   bone graft   LAMINECTOMY  2009   Balateral L4-L5   LEFT HEART CATH AND CORONARY ANGIOGRAPHY N/A 03/20/2019   Procedure: LEFT HEART CATH AND CORONARY ANGIOGRAPHY;  Surgeon: Burnard Debby LABOR, MD;  Location: [facility] INVASIVE CV LAB;  Service: Cardiovascular;  Laterality: N/A;   LEFT HEART CATH AND CORONARY ANGIOGRAPHY N/A 05/20/2019   Procedure: LEFT HEART CATH AND CORONARY ANGIOGRAPHY;  Surgeon: Anner Alm ORN, MD;  Location[facility] INVASIVE CV LAB;  Service: Cardiovascular;  Laterality: N/A;   LEFT HEART CATH AND CORONARY ANGIOGRAPHY N/A 06/10/2022   Procedure: LEFT HEART CATH AND CORONARY ANGIOGRAPHY;  Surgeon: Mady Bruckner, MD;  Location: [facility] INVASIVE CV LAB;  Service: Cardiovascular;  Laterality: N/A;   RESECTION DISTAL CLAVICAL Right 01/23/2019   Procedure: OPEN DISTAL CLAVICLE EXCISION;  Surgeon: Margrette Taft BRAVO, MD;  Location: [facility] ORS;  Service: Orthopedics;  Laterality:  Right;   TOOTH EXTRACTION Bilateral 12/28/2017   Procedure: DENTAL RESTORATION/EXTRACTIONS;  Surgeon: Sheryle Hamilton, DDS;  Location: [facility] OR;  Service: Oral Surgery;  Laterality: Bilateral;    ALLERGIES: Allergies  Allergen Reactions   Fentanyl  Rash    Other Reaction(s): Dizziness   Methadone Other (See Comments)    REACTION: Become mentally unstable   Brilinta  [Ticagrelor ]     Shortness of Breath  FAMILY HISTORY: Family History  Problem Relation Age of Onset   Colon cancer Brother        in his 3s   Heart failure Mother     SOCIAL HISTORY: Social History   Tobacco Use   Smoking status: Every Day    Current packs/day: 0.50    Average packs/day: 0.5 packs/day for 29.0 years (14.5 ttl pk-yrs)    Types: Cigarettes   Smokeless tobacco: Never   Tobacco comments:    Smokes 1/2 pack per day--09/20/20/ 3-4 cigs a day 06/01/22  Vaping Use   Vaping status: Never Used  Substance Use Topics   Alcohol use: Not Currently   Drug use: No   Social History   Social History Narrative   Are you right handed or left handed? left   Are you currently employed ?    What is your current occupation? disability   Do you live at home alone?yes   Who lives with you?    What type of home do you live in: 1 story or 2 story? one   Caffeine 1 soda a day     Objective:  Vital Signs:  BP 138/86   Pulse 95   Ht 6' 2 (1.88 m)   Wt 238 lb (108 kg)   SpO2 98%   BMI 30.56 kg/m   General: General appearance: Awake and alert. No distress. Cooperative with exam.  Skin: No obvious rash or jaundice. HEENT: Atraumatic. Anicteric. Lungs: Non-labored breathing on room air  Extremities: No edema. No obvious deformity.  Musculoskeletal: No obvious joint swelling.  Neurological: Mental Status: Alert. Speech fluent. No pseudobulbar affect Cranial Nerves: CNII: No RAPD. Visual fields intact. CNIII, IV, VI: PERRL. No nystagmus. EOMI. CN V: Facial sensation intact bilaterally to fine  touch. CN VII: Facial muscles symmetric and strong. No ptosis at rest. CN VIII: Hears finger rub well bilaterally. CN IX: No hypophonia. CN X: Palate elevates symmetrically. CN XI: Full strength shoulder shrug bilaterally. CN XII: Tongue protrusion full and midline. No atrophy or fasciculations. No significant dysarthria Motor: Tone is normal. No atrophy. Strength is 5/5 in bilateral upper and lower extremities. Reflexes:  Right Left  Bicep 2+ 2+  Tricep 2+ 2+  BrRad 2+ 2+  Knee 2+ 2+  Ankle Trace Trace   Pathological Reflexes: Babinski: flexor response bilaterally Hoffman: absent bilaterally Troemner: absent bilaterally Sensation: Pinprick: Intact in RUE, diminished in LUE and bilateral LE Coordination: Intact finger-to- nose-finger bilaterally. Romberg negative. Gait: Able to rise from chair with arms crossed unassisted. Normal, narrow-based gait.   Lab and Test Review: No new results  Previously reviewed results: 06/01/22: HbA1c: 6.3 B12: 365 B1 wnl IFE: No M protein   External labs: 04/12/22: CBC unremarkable BMP significant for Cr 1.64   TSH (09/15/20): 0.676 A1c (05/05/20): 5.6   Imaging: CT head and cervical spine wo contrast (10/17/16): FINDINGS: CT HEAD FINDINGS   Brain: No evidence of acute infarction, hemorrhage, hydrocephalus, extra-axial collection or mass lesion/mass effect.   Vascular: No hyperdense vessel or unexpected calcification.   Skull: No osseous abnormality.   Sinuses/Orbits: Visualized paranasal sinuses are clear. Visualized mastoid sinuses are clear. Visualized orbits demonstrate no focal abnormality.   Other: None   CT CERVICAL SPINE FINDINGS   Alignment: Normal.   Skull base and vertebrae: No acute fracture. No primary bone lesion or focal pathologic process.   Soft tissues and spinal canal: No prevertebral fluid or swelling. No visible canal hematoma.   Disc levels: Disc spaces  are maintained. Broad central  disc protrusion at C5-6.   Upper chest: Lung apices are clear.   Other: No fluid collection or hematoma.   IMPRESSION: 1. No acute intracranial pathology. 2. No acute osseous injury of the cervical spine. 3. Broad central disc protrusion at C5-6.  EMG (06/26/22): NCV & EMG Findings: Extensive electrodiagnostic evaluation of the right upper and lower limbs with additional nerve conduction studies of the left upper limb shows: Right sural, superficial peroneal, median, ulnar, and radial sensory responses are within normal limits. Bilateral ulnar (ADM) motor responses show reduced amplitude (L5.9, R6.1 mV). Right peroneal/fibular (EDB), tibial (AH), and median (APB) are within normal limits. Chronic motor axon loss changes without accompanying active denervation changes are seen in the right tibialis anterior and gluteus medius muscles. All other tested muscles are within normal limits with normal motor unit configuration and recruitment patterns.   Impression: This is an abnormal study. The findings are most consistent with the following: The residuals of an old intraspinal canal lesion (ie: motor radiculopathy) at the right L5 root, mild in degree electrically. No electrodiagnostic evidence of a large fiber sensorimotor polyneuropathy. No electrodiagnostic evidence of a right cervical (C5-C8) motor radiculopathy. Reduced bilateral ulnar (ADM) motor responses are of unclear clinical significance given normal needle examination of ulnar innervated muscles and may be technical in nature. The findings are too limited in degree and distribution for definitive diagnosis.  ASSESSMENT: This is ASMAR BROZEK, a 62 y.o. male with chronic pain, particularly in left arm. The pain is the entire arm distal to the level of the deltoid muscle. There is no obvious weakness or abnormal reflexes, but subjective sensory loss. EMG completed in 06/2022 looking for peripheral neuropathy given different concerns at  appointment on 06/01/22 showed only the residuals of an old right L5 radiculopathy. LUE was not extensively evaluated in that EMG. He is not in favor of further EMG. He has significant neck pain, so cervical radiculopathy on the left is possible. He did not get MRI cervical spine as recommended after last visit, but is in agreement to get it done.  Plan: -MRI cervical spine wo contrast -Physical therapy for neck and back pain -Fall precautions discussed -Chronic pain management per pain management team -Continue Lyrica  200 mg TID  Return to clinic to be determined  Venetia Potters, MD

## 2023-04-10 NOTE — Progress Notes (Addendum)
Patient last filled a 3 month supply in September through another pharmacy. Refill is due in January

## 2023-04-16 ENCOUNTER — Other Ambulatory Visit: Payer: Self-pay

## 2023-04-18 ENCOUNTER — Encounter: Payer: Self-pay | Admitting: Neurology

## 2023-04-18 ENCOUNTER — Ambulatory Visit: Payer: 59 | Admitting: Neurology

## 2023-04-18 VITALS — BP 138/86 | HR 95 | Ht 74.0 in | Wt 238.0 lb

## 2023-04-18 DIAGNOSIS — M79602 Pain in left arm: Secondary | ICD-10-CM

## 2023-04-18 DIAGNOSIS — M5417 Radiculopathy, lumbosacral region: Secondary | ICD-10-CM

## 2023-04-18 DIAGNOSIS — G629 Polyneuropathy, unspecified: Secondary | ICD-10-CM | POA: Diagnosis not present

## 2023-04-18 DIAGNOSIS — G894 Chronic pain syndrome: Secondary | ICD-10-CM

## 2023-04-18 DIAGNOSIS — M545 Low back pain, unspecified: Secondary | ICD-10-CM

## 2023-04-18 DIAGNOSIS — M542 Cervicalgia: Secondary | ICD-10-CM

## 2023-04-18 DIAGNOSIS — R209 Unspecified disturbances of skin sensation: Secondary | ICD-10-CM | POA: Diagnosis not present

## 2023-04-18 DIAGNOSIS — M5416 Radiculopathy, lumbar region: Secondary | ICD-10-CM

## 2023-04-18 DIAGNOSIS — G8929 Other chronic pain: Secondary | ICD-10-CM

## 2023-04-18 NOTE — Patient Instructions (Addendum)
 I want to get that MRI of your cervical spine that did not happen after our last visit.  A referral to [facility] has been placed for your MRI someone will contact you directly to schedule your appt. They are located at [address] [facility]. Please contact them directly by calling [phone] with any questions regarding your referral. If you do not hear from anyone in 1-2 weeks, please let me know.  I am also referring you to physical therapy for neck and back pain. If you do not hear from someone in 1-2 weeks, please let me know.  I will be in touch when I have your results.  The physicians and staff at [facility] are committed to providing excellent care. You may receive a survey requesting feedback about your experience at our office. We strive to receive very good responses to the survey questions. If you feel that your experience would prevent you from giving the office a very good  response, please contact our office to try to remedy the situation. We may be reached at [phone]. Thank you for taking the time out of your busy day to complete the survey.  Michael Potters, MD [facility]  Preventing Falls at [facility] are common, often dreaded events in the lives of older people. Aside from the obvious injuries and even death that may result, fall can cause wide-ranging consequences including loss of independence, mental decline, decreased activity and mobility. Younger people are also at risk of falling, especially those with chronic illnesses and fatigue.  Ways to reduce risk for falling Examine diet and medications. Warm foods and alcohol dilate blood vessels, which can lead to dizziness when standing. Sleep aids, antidepressants and pain medications can also increase the likelihood of a fall.  Get a vision exam. Poor vision, cataracts and glaucoma increase the chances of falling.  Check foot gear. Shoes should fit snugly and have a sturdy, nonskid sole and  a broad, low heel  Participate in a physician-approved exercise program to build and maintain muscle strength and improve balance and coordination. Programs that use ankle weights or stretch bands are excellent for muscle-strengthening. Water aerobics programs and low-impact Tai Chi programs have also been shown to improve balance and coordination.  Increase vitamin D intake. Vitamin D improves muscle strength and increases the amount of calcium  the body is able to absorb and deposit in bones.  How to prevent falls from common hazards Floors - Remove all loose wires, cords, and throw rugs. Minimize clutter. Make sure rugs are anchored and smooth. Keep furniture in its usual place.  Chairs -- Use chairs with straight backs, armrests and firm seats. Add firm cushions to existing pieces to add height.  Bathroom - Install grab bars and non-skid tape in the tub or shower. Use a bathtub transfer bench or a shower chair with a back support Use an elevated toilet seat and/or safety rails to assist standing from a low surface. Do not use towel racks or bathroom tissue holders to help you stand.  Lighting - Make sure halls, stairways, and entrances are well-lit. Install a night light in your bathroom or hallway. Make sure there is a light switch at the top and bottom of the staircase. Turn lights on if you get up in the middle of the night. Make sure lamps or light switches are within reach of the bed if you have to get up during the night.  Kitchen - Install non-skid rubber mats near the  sink and stove. Clean spills immediately. Store frequently used utensils, pots, pans between waist and eye level. This helps prevent reaching and bending. Sit when getting things out of lower cupboards.  Living room/ Bedrooms - Place furniture with wide spaces in between, giving enough room to move around. Establish a route through the living room that gives you something to hold onto as you walk.  Stairs - Make sure  treads, rails, and rugs are secure. Install a rail on both sides of the stairs. If stairs are a threat, it might be helpful to arrange most of your activities on the lower level to reduce the number of times you must climb the stairs.  Entrances and doorways - Install metal handles on the walls adjacent to the doorknobs of all doors to make it more secure as you travel through the doorway.  Tips for maintaining balance Keep at least one hand free at all times. Try using a backpack or fanny pack to hold things rather than carrying them in your hands. Never carry objects in both hands when walking as this interferes with keeping your balance.  Attempt to swing both arms from front to back while walking. This might require a conscious effort if Parkinson's disease has diminished your movement. It will, however, help you to maintain balance and posture, and reduce fatigue.  Consciously lift your feet off of the ground when walking. Shuffling and dragging of the feet is a common culprit in losing your balance.  When trying to navigate turns, use a U technique of facing forward and making a wide turn, rather than pivoting sharply.  Try to stand with your feet shoulder-length apart. When your feet are close together for any length of time, you increase your risk of losing your balance and falling.  Do one thing at a time. Don't try to walk and accomplish another task, such as reading or looking around. The decrease in your automatic reflexes complicates motor function, so the less distraction, the better.  Do not wear rubber or gripping soled shoes, they might catch on the floor and cause tripping.  Move slowly when changing positions. Use deliberate, concentrated movements and, if needed, use a grab bar or walking aid. Count 15 seconds between each movement. For example, when rising from a seated position, wait 15 seconds after standing to begin walking.  If balance is a continuous problem, you  might want to consider a walking aid such as a cane, walking stick, or walker. Once you've mastered walking with help, you might be ready to try it on your own again.

## 2023-04-18 NOTE — Addendum Note (Signed)
 Addended by: Lenise Herald on: 04/18/2023 04:23 PM   Modules accepted: Orders

## 2023-04-19 ENCOUNTER — Encounter: Payer: Self-pay | Admitting: Nurse Practitioner

## 2023-04-19 ENCOUNTER — Ambulatory Visit: Payer: 59 | Attending: Nurse Practitioner | Admitting: Nurse Practitioner

## 2023-04-19 ENCOUNTER — Telehealth: Payer: Self-pay | Admitting: Nurse Practitioner

## 2023-04-19 VITALS — BP 118/62 | HR 91 | Ht 74.0 in | Wt 241.8 lb

## 2023-04-19 DIAGNOSIS — I6523 Occlusion and stenosis of bilateral carotid arteries: Secondary | ICD-10-CM

## 2023-04-19 DIAGNOSIS — I252 Old myocardial infarction: Secondary | ICD-10-CM

## 2023-04-19 DIAGNOSIS — E785 Hyperlipidemia, unspecified: Secondary | ICD-10-CM

## 2023-04-19 DIAGNOSIS — Z01818 Encounter for other preprocedural examination: Secondary | ICD-10-CM

## 2023-04-19 DIAGNOSIS — I2510 Atherosclerotic heart disease of native coronary artery without angina pectoris: Secondary | ICD-10-CM | POA: Diagnosis not present

## 2023-04-19 DIAGNOSIS — I10 Essential (primary) hypertension: Secondary | ICD-10-CM

## 2023-04-19 DIAGNOSIS — Z01810 Encounter for preprocedural cardiovascular examination: Secondary | ICD-10-CM | POA: Diagnosis not present

## 2023-04-19 DIAGNOSIS — F17200 Nicotine dependence, unspecified, uncomplicated: Secondary | ICD-10-CM

## 2023-04-19 MED ORDER — CLOPIDOGREL BISULFATE 75 MG PO TABS: ORAL_TABLET | ORAL | 0 refills | Status: DC

## 2023-04-19 MED ORDER — FENOFIBRATE 145 MG PO TABS: 145.0000 mg | ORAL_TABLET | Freq: Every day | ORAL | 5 refills | Status: AC

## 2023-04-19 MED ORDER — METOPROLOL SUCCINATE ER 25 MG PO TB24: 12.5000 mg | ORAL_TABLET | Freq: Every morning | ORAL | 1 refills | Status: AC

## 2023-04-19 MED ORDER — ROSUVASTATIN CALCIUM 40 MG PO TABS: 40.0000 mg | ORAL_TABLET | Freq: Every day | ORAL | 0 refills | Status: DC

## 2023-04-19 MED ORDER — JARDIANCE 10 MG PO TABS: 10.0000 mg | ORAL_TABLET | Freq: Every day | ORAL | 5 refills | Status: DC

## 2023-04-19 NOTE — H&P (View-Only) (Signed)
Cardiology Office Note:  .   Date:  04/19/2023  ID:  Michael [name], DOB [birth year], MRN [id number] PCP: Elmer Picker, [facility] Providers Cardiologist:  [name], MD    History of Present Illness: .   Michael [name] is a 63 y.o. male with a PMH of CAD, s/p STEMI, HLD, HTN, carotid artery stenosis, hx of sarcoid, tobacco use, dizziness, chronic pain, CKD, who presents today for 3 month follow-up. 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 Followed by Hem/Onc, has had hypercoagulable workup that has been reported to be negative.   Admission in 2020 with STEMI, cardiac cath revealed occluded dLAD and OM1, probable embolic occlusion, spontaneous reperfusion of LAD, low level PTCA of dLCX. Source of emboli was unclear, no A-fib noted at the time, no significant finding on Echo. Admission in 2021 with CP, trop peak 5300, cardiac cath did not show significant dx, cMRI showed EF 56%, inferolateral hypokinesis with delayed enhancement consistent with scar.   Admission 06/2022 with inferior STEMI. Cardiac cath revealed mPDA occlusion, unsuccessful attempt at PTCA. TTE EF 50%. Thought that event was possible recurrent cardio embolic event. Was referred to hematology for hypercoagulable workup given multiple events. Hem note 07/2022 says workup was negative.   Last seen by Dr. [name] on January 15, 2023. Dr. Wyline Mood consulted Dr. Elbert Ewings who stated that because patient's hypercoagulable workup was negative, was okay to proceed with TEE/loop recorder. TEE was arranged, but was canceled as pt no showed.   Today he presents for follow-up. Doing well. Does admit to some wheezing d/t cold weather, but denies any acute cardiac complaints or issues. Denies any chest pain, shortness of breath, palpitations, syncope, presyncope, dizziness, orthopnea, PND, swelling or significant weight changes, acute bleeding, or claudication. Admits to smoking and  stress, denies any red flag signs/symptoms.   ROS: Negative. See HPI.   Studies Reviewed: .    Carotid duplex 08/2022:  IMPRESSION: 1. Moderate-to-large amount of right-sided atherosclerotic plaque results in elevated peak systolic velocities within the right internal carotid artery compatible with the 50-69% luminal narrowing range. Further evaluation with CTA could be performed as indicated. 2. Moderate amount of left-sided atherosclerotic plaque, not resulting in a hemodynamically significant stenosis.  Echo 06/2022:  1. Left ventricular ejection fraction, by estimation, is 50%. The left  ventricle has low normal function. The left ventricle demonstrates  regional wall motion abnormalities (see scoring diagram/findings for  description). There is mild asymmetric left  ventricular hypertrophy of the septal segment. Left ventricular diastolic  parameters were grossly normal.   2. Right ventricular systolic function is normal. The right ventricular  size is normal. Tricuspid regurgitation signal is inadequate for assessing  PA pressure.   3. The mitral valve is normal in structure. Trivial mitral valve  regurgitation. No evidence of mitral stenosis.   4. The aortic valve is tricuspid. There is mild thickening of the aortic  valve. Aortic valve regurgitation is not visualized. No aortic stenosis is  present.   5. The inferior vena cava is normal in size with greater than 50%  respiratory variability, suggesting right atrial pressure of 3 mmHg.  LHC 06/2022:  Conclusions: Severe single vessel coronary artery disease with occlusion of mid rPDA.  Appearance is similar to distal LAD and LCx occlusions seen in 2020 and may reflect recurrent coronary embolism.  Mild RCA stenosis is similar to prior catheterization in 2021. Mildly elevated left ventricular filling pressure (LVEDP 20  mmHg). Attempted PTCA to rPDA occlusion; flow could not be reestablished despite multiple inflations with a  2.0 x 12 mm balloon.   Recommendations: Tirofiban infusion for 18 hours. Titrate nitroglycerin infusion for relief of chest pain. Dual antiplatelet therapy with aspirin and ticagrelor for at least 12 months. Obtain echocardiogram. Consider hypercoagulable workup and TEE/ILR to assess for potential causes of recurrent coronary occlusions suspicious for emboli. Aggressive secondary prevention of coronary artery disease.   cMRI 05/2019:  IMPRESSION: 1. Normal left ventricular size with EF 56%, severe hypokinesis to akinesis of the mid inferolateral wall extending into a portion of the mid inferior wall.   2.  Normal RV size and systolic function, EF 51%.   3. Delayed enhancement pattern in the inferolateral wall matches the hypokinesis on cine images and looks like a coronary disease pattern (prior MI).   LHC 05/2019:  There is moderate to severe left ventricular systolic dysfunction with regional and global hypokinesis. Ejection Fraction (EF) appears to be 25-35% by visual estimate. LV end diastolic pressure is mildly elevated. The previously occluded 1st Mrg treated with PTCA is widely patent. Previously occluded Dist LAD lesionis now widely patent Ost RCA lesion is 20% stenosed.   SUMMARY Previously occluded distal circumflex is widely patent as is the LAD.  Mild ostial RCA but otherwise normal, tortuous vessel. Dilated Left Ventricle with global hypokinesis worse in the inferior and anterior apex.  EF estimated 30 of 35%. Suspect NONISCHEMIC CARDIOMYOPATHY     RECOMMENDATIONS Recommend checking 2D echocardiogram for better assessment of EF and wall motion. Evaluate for causes of Nonischemic Cardiomyopathy with elevated troponin -> consider myocarditis. Further plans per primary service -> would potentially consider cardiac MRI with concern for myocarditis.  Cardiac monitor 05/2019:  30 day event monitor Tracings primarily show sinus rhythm and sinus tachycardia. Short 4 beat  run of atach. Short runs of NSVT up to 12 beats. No atrial fibrillation or atrial flutter noted  LHC 03/2019: Dist LAD lesion is 100% stenosed. 1st Mrg lesion is 100% stenosed. The left ventricular systolic function is normal. The left ventricular ejection fraction is 50-55% by visual estimate. Post intervention, there is a 0% residual stenosis.   Probable embolic occlusion of the distal OM branch of the circumflex as well as distal LAD.   Normal RCA with superior takeoff.   Low normal global LV function with EF estimated 50 to 55% and focal area of mid anterolateral and focal apical hypocontractility.   Low level PTCA of the distal circumflex with improvement of flow from TIMI 0 to TIMI I in a small caliber distal vessel.   Spontaneous reperfusion of the LAD which ultimately wraps around the LV apex.   RECOMMENDATION: Aggrastat was started due to high suspect for embolic etiology rather than atherosclerotic plaque; continue for 18 hours post procedure.  DAPT with aspirin/Brilinta.  Smoking cessation is imperative.  We will plan for echo Doppler study in a.m.   Physical Exam:   VS:  BP 118/62 (Cuff Size: Normal)   Pulse 91   Ht 6\' 2"  (1.88 m)   Wt 241 lb 12.8 oz (109.7 kg)   SpO2 95%   BMI 31.05 kg/m    Wt Readings from Last 3 Encounters:  04/19/23 241 lb 12.8 oz (109.7 kg)  04/18/23 238 lb (108 kg)  01/15/23 255 lb 3.2 oz (115.8 kg)    GEN: Well nourished, well developed in no acute distress NECK: No JVD; No carotid bruits CARDIAC: S1/S2, RRR, no murmurs, rubs,  gallops RESPIRATORY:  Clear to auscultation without rales, wheezing or rhonchi  ABDOMEN: Soft, non-tender, non-distended EXTREMITIES:  No edema; No deformity   ASSESSMENT AND PLAN: .    CAD, hx of recurrent MI's, hx of STEMI Denies any chest pain. His previous hx of recurrent MI's appeared to be embolic, unclear source. Dr. [name] previously recommended that if patient's hypercoagulable workup was  negative, would likely plan for TEE. This was previously arranged by Dr. Wyline Mood, however pt no showed. Discussed/reviewed risks vs benefits of TEE for further evaluation, and he verbalized understanding and is agreeable to proceed. See below. Will obtain CBC and BMET prior to procedure. Continue current medication regimen. Heart healthy diet and regular cardiovascular exercise encouraged. Care and ED precautions discussed. Orders are under signed and held.     Informed Consent   Shared Decision Making/Informed Consent   The risks [esophageal damage, perforation (1:10,000 risk), bleeding, pharyngeal hematoma as well as other potential complications associated with conscious sedation including aspiration, arrhythmia, respiratory failure and death], benefits (treatment guidance and diagnostic support) and alternatives of a transesophageal echocardiogram were discussed in detail with Michael [name] and he is willing to proceed.      2. Carotid artery stenosis, HLD Denies any symptoms. Carotid duplex 08/2022 revealed moderate to large amount of plaque along right ICA compatible with 50-69% range. It was suggested that further evaluation with CTA could be performed as indicated, also noted to have moderate amount of left sided plaque, was not found to be hemodynamically significant. Continue aspirin, plavix, Zetia, Repatha, and rosuvastatin.   3. HTN BP stable. Discussed to monitor BP at home at least 2 hours after medications and sitting for 5-10 minutes. Continue current medication regimen. Heart healthy diet and regular cardiovascular exercise encouraged.   4. Tobacco abuse Smoking cessation encouraged and discussed.   Dispo: Will provide refills per his request. Follow-up with me/APP in 4 weeks or sooner if anything changes.   Signed, Sharlene Dory, NP

## 2023-04-19 NOTE — Progress Notes (Signed)
 Cardiology Office Note:  .   Date:  04/19/2023  ID:  Michael [name], DOB [birth year], MRN [id number] PCP: Elmer Picker, [facility] Providers Cardiologist:  [name], MD    History of Present Illness: .   Michael [name] is a 63 y.o. male with a PMH of CAD, s/p STEMI, HLD, HTN, carotid artery stenosis, hx of sarcoid, tobacco use, dizziness, chronic pain, CKD, who presents today for 3 month follow-up. 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 Followed by Hem/Onc, has had hypercoagulable workup that has been reported to be negative.   Admission in 2020 with STEMI, cardiac cath revealed occluded dLAD and OM1, probable embolic occlusion, spontaneous reperfusion of LAD, low level PTCA of dLCX. Source of emboli was unclear, no A-fib noted at the time, no significant finding on Echo. Admission in 2021 with CP, trop peak 5300, cardiac cath did not show significant dx, cMRI showed EF 56%, inferolateral hypokinesis with delayed enhancement consistent with scar.   Admission 06/2022 with inferior STEMI. Cardiac cath revealed mPDA occlusion, unsuccessful attempt at PTCA. TTE EF 50%. Thought that event was possible recurrent cardio embolic event. Was referred to hematology for hypercoagulable workup given multiple events. Hem note 07/2022 says workup was negative.   Last seen by Dr. [name] on January 15, 2023. Dr. Wyline Mood consulted Dr. Elbert Ewings who stated that because patient's hypercoagulable workup was negative, was okay to proceed with TEE/loop recorder. TEE was arranged, but was canceled as pt no showed.   Today he presents for follow-up. Doing well. Does admit to some wheezing d/t cold weather, but denies any acute cardiac complaints or issues. Denies any chest pain, shortness of breath, palpitations, syncope, presyncope, dizziness, orthopnea, PND, swelling or significant weight changes, acute bleeding, or claudication. Admits to smoking and  stress, denies any red flag signs/symptoms.   ROS: Negative. See HPI.   Studies Reviewed: .    Carotid duplex 08/2022:  IMPRESSION: 1. Moderate-to-large amount of right-sided atherosclerotic plaque results in elevated peak systolic velocities within the right internal carotid artery compatible with the 50-69% luminal narrowing range. Further evaluation with CTA could be performed as indicated. 2. Moderate amount of left-sided atherosclerotic plaque, not resulting in a hemodynamically significant stenosis.  Echo 06/2022:  1. Left ventricular ejection fraction, by estimation, is 50%. The left  ventricle has low normal function. The left ventricle demonstrates  regional wall motion abnormalities (see scoring diagram/findings for  description). There is mild asymmetric left  ventricular hypertrophy of the septal segment. Left ventricular diastolic  parameters were grossly normal.   2. Right ventricular systolic function is normal. The right ventricular  size is normal. Tricuspid regurgitation signal is inadequate for assessing  PA pressure.   3. The mitral valve is normal in structure. Trivial mitral valve  regurgitation. No evidence of mitral stenosis.   4. The aortic valve is tricuspid. There is mild thickening of the aortic  valve. Aortic valve regurgitation is not visualized. No aortic stenosis is  present.   5. The inferior vena cava is normal in size with greater than 50%  respiratory variability, suggesting right atrial pressure of 3 mmHg.  LHC 06/2022:  Conclusions: Severe single vessel coronary artery disease with occlusion of mid rPDA.  Appearance is similar to distal LAD and LCx occlusions seen in 2020 and may reflect recurrent coronary embolism.  Mild RCA stenosis is similar to prior catheterization in 2021. Mildly elevated left ventricular filling pressure (LVEDP 20  mmHg). Attempted PTCA to rPDA occlusion; flow could not be reestablished despite multiple inflations with a  2.0 x 12 mm balloon.   Recommendations: Tirofiban infusion for 18 hours. Titrate nitroglycerin infusion for relief of chest pain. Dual antiplatelet therapy with aspirin and ticagrelor for at least 12 months. Obtain echocardiogram. Consider hypercoagulable workup and TEE/ILR to assess for potential causes of recurrent coronary occlusions suspicious for emboli. Aggressive secondary prevention of coronary artery disease.   cMRI 05/2019:  IMPRESSION: 1. Normal left ventricular size with EF 56%, severe hypokinesis to akinesis of the mid inferolateral wall extending into a portion of the mid inferior wall.   2.  Normal RV size and systolic function, EF 51%.   3. Delayed enhancement pattern in the inferolateral wall matches the hypokinesis on cine images and looks like a coronary disease pattern (prior MI).   LHC 05/2019:  There is moderate to severe left ventricular systolic dysfunction with regional and global hypokinesis. Ejection Fraction (EF) appears to be 25-35% by visual estimate. LV end diastolic pressure is mildly elevated. The previously occluded 1st Mrg treated with PTCA is widely patent. Previously occluded Dist LAD lesionis now widely patent Ost RCA lesion is 20% stenosed.   SUMMARY Previously occluded distal circumflex is widely patent as is the LAD.  Mild ostial RCA but otherwise normal, tortuous vessel. Dilated Left Ventricle with global hypokinesis worse in the inferior and anterior apex.  EF estimated 30 of 35%. Suspect NONISCHEMIC CARDIOMYOPATHY     RECOMMENDATIONS Recommend checking 2D echocardiogram for better assessment of EF and wall motion. Evaluate for causes of Nonischemic Cardiomyopathy with elevated troponin -> consider myocarditis. Further plans per primary service -> would potentially consider cardiac MRI with concern for myocarditis.  Cardiac monitor 05/2019:  30 day event monitor Tracings primarily show sinus rhythm and sinus tachycardia. Short 4 beat  run of atach. Short runs of NSVT up to 12 beats. No atrial fibrillation or atrial flutter noted  LHC 03/2019: Dist LAD lesion is 100% stenosed. 1st Mrg lesion is 100% stenosed. The left ventricular systolic function is normal. The left ventricular ejection fraction is 50-55% by visual estimate. Post intervention, there is a 0% residual stenosis.   Probable embolic occlusion of the distal OM branch of the circumflex as well as distal LAD.   Normal RCA with superior takeoff.   Low normal global LV function with EF estimated 50 to 55% and focal area of mid anterolateral and focal apical hypocontractility.   Low level PTCA of the distal circumflex with improvement of flow from TIMI 0 to TIMI I in a small caliber distal vessel.   Spontaneous reperfusion of the LAD which ultimately wraps around the LV apex.   RECOMMENDATION: Aggrastat was started due to high suspect for embolic etiology rather than atherosclerotic plaque; continue for 18 hours post procedure.  DAPT with aspirin/Brilinta.  Smoking cessation is imperative.  We will plan for echo Doppler study in a.m.   Physical Exam:   VS:  BP 118/62 (Cuff Size: Normal)   Pulse 91   Ht 6\' 2"  (1.88 m)   Wt 241 lb 12.8 oz (109.7 kg)   SpO2 95%   BMI 31.05 kg/m    Wt Readings from Last 3 Encounters:  04/19/23 241 lb 12.8 oz (109.7 kg)  04/18/23 238 lb (108 kg)  01/15/23 255 lb 3.2 oz (115.8 kg)    GEN: Well nourished, well developed in no acute distress NECK: No JVD; No carotid bruits CARDIAC: S1/S2, RRR, no murmurs, rubs,  gallops RESPIRATORY:  Clear to auscultation without rales, wheezing or rhonchi  ABDOMEN: Soft, non-tender, non-distended EXTREMITIES:  No edema; No deformity   ASSESSMENT AND PLAN: .    CAD, hx of recurrent MI's, hx of STEMI Denies any chest pain. His previous hx of recurrent MI's appeared to be embolic, unclear source. Dr. [name] previously recommended that if patient's hypercoagulable workup was  negative, would likely plan for TEE. This was previously arranged by Dr. Wyline Mood, however pt no showed. Discussed/reviewed risks vs benefits of TEE for further evaluation, and he verbalized understanding and is agreeable to proceed. See below. Will obtain CBC and BMET prior to procedure. Continue current medication regimen. Heart healthy diet and regular cardiovascular exercise encouraged. Care and ED precautions discussed. Orders are under signed and held.     Informed Consent   Shared Decision Making/Informed Consent   The risks [esophageal damage, perforation (1:10,000 risk), bleeding, pharyngeal hematoma as well as other potential complications associated with conscious sedation including aspiration, arrhythmia, respiratory failure and death], benefits (treatment guidance and diagnostic support) and alternatives of a transesophageal echocardiogram were discussed in detail with Mr. Hartsel and he is willing to proceed.      2. Carotid artery stenosis, HLD Denies any symptoms. Carotid duplex 08/2022 revealed moderate to large amount of plaque along right ICA compatible with 50-69% range. It was suggested that further evaluation with CTA could be performed as indicated, also noted to have moderate amount of left sided plaque, was not found to be hemodynamically significant. Continue aspirin, plavix, Zetia, Repatha, and rosuvastatin.   3. HTN BP stable. Discussed to monitor BP at home at least 2 hours after medications and sitting for 5-10 minutes. Continue current medication regimen. Heart healthy diet and regular cardiovascular exercise encouraged.   4. Tobacco abuse Smoking cessation encouraged and discussed.   Dispo: Will provide refills per his request. Follow-up with me/APP in 4 weeks or sooner if anything changes.   Signed, Sharlene Dory, NP

## 2023-04-19 NOTE — Patient Instructions (Addendum)
 Medication Instructions:  Your physician recommends that you continue on your current medications as directed. Please refer to the Current Medication list given to you today.  Labwork: In 1 week   Testing/Procedures: Your physician has requested that you have a TEE. During a TEE, sound waves are used to create images of your heart. It provides your doctor with information about the size and shape of your heart and how well your heart's chambers and valves are working. In this test, a transducer is attached to the end of a flexible tube that's guided down your throat and into your esophagus (the tube leading from you mouth to your stomach) to get a more detailed image of your heart. You are not awake for the procedure. Please see the instruction sheet given to you today. For further information please visit [email or web address] On the morning of your Procedure Please HOLD LASIX , JARDIANCE , AND METFORMIN  Follow-Up: Your physician recommends that you schedule a follow-up appointment in: 4 weeks   Any Other Special Instructions Will Be Listed Below (If Applicable).  If you need a refill on your cardiac medications before your next appointment, please call your pharmacy.

## 2023-04-19 NOTE — Telephone Encounter (Signed)
 Checking percert on the following patient for testing scheduled at [facility].   TEE 05/06/23  with Dr.Mallipeddi
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
## 2023-04-24 ENCOUNTER — Ambulatory Visit (HOSPITAL_COMMUNITY): Payer: 59 | Attending: Neurology

## 2023-04-24 ENCOUNTER — Other Ambulatory Visit: Payer: Self-pay

## 2023-04-24 DIAGNOSIS — M542 Cervicalgia: Secondary | ICD-10-CM | POA: Diagnosis present

## 2023-04-24 DIAGNOSIS — G8929 Other chronic pain: Secondary | ICD-10-CM | POA: Insufficient documentation

## 2023-04-24 DIAGNOSIS — M545 Low back pain, unspecified: Secondary | ICD-10-CM | POA: Insufficient documentation

## 2023-04-26 NOTE — Patient Instructions (Addendum)
Michael [name]  04/26/2023     @PREFPERIOPPHARMACY @   Your procedure is scheduled on 05/03/2023.   Report to [facility] at  1100 A.M.   Call this number if you have problems the morning of surgery:  [phone]  If you experience any cold or flu symptoms such as cough, fever, chills, shortness of breath, etc. between now and your scheduled surgery, please notify us at the above number.   Remember:        Your last dose of jardiance should be on 04/29/2023.   Do not eat after midnight.   You may drink clear liquids until  0900 am on 05/03/2023.    Clear liquids allowed are:                    Water, Juice (No red color; non-citric and without pulp; diabetics please choose diet or no sugar options), Carbonated beverages (diabetics please choose diet or no sugar options), Clear Tea (No creamer, milk, or cream, including half & half and powdered creamer), Black Coffee Only (No creamer, milk or cream, including half & half and powdered creamer), and Clear Sports drink (No red color; diabetics please choose diet or no sugar options)    Take these medicines the morning of surgery with A SIP OF WATER         amlodipine, abilify, fluoxetine, meclizine, methocarbamol, metoprolol, oxycodone, pregabalin.     Do not wear jewelry, make-up or nail polish, including gel polish,  artificial nails, or any other type of covering on natural nails (fingers and  toes).  Do not wear lotions, powders, or perfumes, or deodorant.  Do not shave 48 hours prior to surgery.  Men may shave face and neck.  Do not bring valuables to the hospital.  [facility] is not responsible for any belongings or valuables.  Contacts, dentures or bridgework may not be worn into surgery.  Leave your suitcase in the car.  After surgery it may be brought to your room.  For patients admitted to the hospital, discharge time will be determined by your treatment team.  Patients discharged the day of surgery will not be  allowed to drive home and must  have someone with them for 24 hours.    Special instructions:   DO NOT smoke tobacco or vape for 24 hours before your procedure.  Please read over the following fact sheets that you were given. Anesthesia Post-op Instructions and Care and Recovery After Surgery       Transesophageal Echocardiogram Transesophageal echocardiogram (TEE) is a test that uses sound waves to take pictures of your heart. TEE is done by passing a small probe attached to a flexible tube down the part of the body that moves food from your mouth to your stomach (esophagus). The pictures give clear images of your heart. This can help your doctor see if there are problems with your heart. Tell a doctor about: Any allergies you have. All medicines you are taking. This includes vitamins, herbs, eye drops, creams, and over-the-counter medicines. Any problems you or family members have had with anesthetic medicines. Any blood disorders you have. Any surgeries you have had. Any medical conditions you have. Any swallowing problems. Whether you have or have had a blockage in the part of the body that moves food from your mouth to your stomach. Whether you are pregnant or may be pregnant. What are the risks? In general, this is a safe procedure. But,  problems may occur, such as: Damage to nearby structures or organs. A tear in the part of the body that moves food from your mouth to your stomach. Irregular heartbeat. Hoarse voice or trouble swallowing. Bleeding. What happens before the procedure? Medicines Ask your doctor about changing or stopping: Your normal medicines. Vitamins, herbs, and supplements. Over-the-counter medicines. Do not take aspirin or ibuprofen unless you are told to. General instructions Follow instructions from your doctor about what you cannot eat or drink. You will take out any dentures or dental retainers. Plan to have a responsible adult take you home from  the hospital or clinic. Plan to have a responsible adult care for you for the time you are told after you leave the hospital or clinic. This is important. What happens during the procedure?  An IV will be put into one of your veins. You may be given: A sedative. This medicine helps you relax. A medicine to numb the back of your throat. This may be sprayed or gargled. Your blood pressure, heart rate, and breathing will be watched. You may be asked to lie on your left side. A bite block will be placed in your mouth. This keeps you from biting the tube. The tip of the probe will be placed into the back of your mouth. You will be asked to swallow. Your doctor will take pictures of your heart. The probe and bite block will be taken out after the test is done. The procedure may vary among doctors and hospitals. What can I expect after the procedure? You will be monitored until you leave the hospital or clinic. This includes checking your blood pressure, heart rate, breathing rate, and blood oxygen level. Your throat may feel sore and numb. This will get better over time. You will not be allowed to eat or drink until the numbness has gone away. It is common to have a sore throat for a day or two. It is up to you to get the results of your procedure. Ask how to get your results when they are ready. Follow these instructions at home: If you were given a sedative during your procedure, do not drive or use machines until your doctor says that it is safe. Return to your normal activities when your doctor says that it is safe. Keep all follow-up visits. Summary TEE is a test that uses sound waves to take pictures of your heart. You will be given a medicine to help you relax. Do not drive or use machines until your doctor says that it is safe. This information is not intended to replace advice given to you by your health care provider. Make sure you discuss any questions you have with your health care  provider. Document Revised: 12/08/2020 Document Reviewed: 11/18/2019 Elsevier Patient Education  2024 [employer]. Monitored Anesthesia Care, Care After The following information offers guidance on how to care for yourself after your procedure. Your health care provider may also give you more specific instructions. If you have problems or questions, contact your health care provider. What can I expect after the procedure? After the procedure, it is common to have: Tiredness. Little or no memory about what happened during or after the procedure. Impaired judgment when it comes to making decisions. Nausea or vomiting. Some trouble with balance. Follow these instructions at home: For the time period you were told by your health care provider:  Rest. Do not participate in activities where you could fall or become injured. Do not drive  or use machinery. Do not drink alcohol. Do not take sleeping pills or medicines that cause drowsiness. Do not make important decisions or sign legal documents. Do not take care of children on your own. Medicines Take over-the-counter and prescription medicines only as told by your health care provider. If you were prescribed antibiotics, take them as told by your health care provider. Do not stop using the antibiotic even if you start to feel better. Eating and drinking Follow instructions from your health care provider about what you may eat and drink. Drink enough fluid to keep your urine pale yellow. If you vomit: Drink clear fluids slowly and in small amounts as you are able. Clear fluids include water, ice chips, low-calorie sports drinks, and fruit juice that has water added to it (diluted fruit juice). Eat light and bland foods in small amounts as you are able. These foods include bananas, applesauce, rice, lean meats, toast, and crackers. General instructions  Have a responsible adult stay with you for the time you are told. It is important to  have someone help care for you until you are awake and alert. If you have sleep apnea, surgery and some medicines can increase your risk for breathing problems. Follow instructions from your health care provider about wearing your sleep device: When you are sleeping. This includes during daytime naps. While taking prescription pain medicines, sleeping medicines, or medicines that make you drowsy. Do not use any products that contain nicotine or tobacco. These products include cigarettes, chewing tobacco, and vaping devices, such as e-cigarettes. If you need help quitting, ask your health care provider. Contact a health care provider if: You feel nauseous or vomit every time you eat or drink. You feel light-headed. You are still sleepy or having trouble with balance after 24 hours. You get a rash. You have a fever. You have redness or swelling around the IV site. Get help right away if: You have trouble breathing. You have new confusion after you get home. These symptoms may be an emergency. Get help right away. Call 911. Do not wait to see if the symptoms will go away. Do not drive yourself to the hospital. This information is not intended to replace advice given to you by your health care provider. Make sure you discuss any questions you have with your health care provider. Document Revised: 08/22/2021 Document Reviewed: 08/22/2021 Elsevier Patient Education  2024 [employer].

## 2023-04-27 ENCOUNTER — Ambulatory Visit (HOSPITAL_COMMUNITY): Payer: 59

## 2023-04-27 DIAGNOSIS — M542 Cervicalgia: Secondary | ICD-10-CM

## 2023-04-27 DIAGNOSIS — M545 Low back pain, unspecified: Secondary | ICD-10-CM
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
## 2023-04-30 ENCOUNTER — Telehealth: Payer: Self-pay | Admitting: Neurology

## 2023-04-30 ENCOUNTER — Encounter (HOSPITAL_COMMUNITY): Payer: Self-pay

## 2023-04-30 ENCOUNTER — Encounter (HOSPITAL_COMMUNITY)
Admission: RE | Admit: 2023-04-30 | Discharge: 2023-04-30 | Disposition: A | Discharge status: Home / Self Care | Payer: 59 | Source: Ambulatory Visit | Attending: Internal Medicine | Admitting: Internal Medicine

## 2023-04-30 VITALS — BP 118/62 | HR 91 | Temp 97.8°F | Resp 18 | Ht 74.0 in | Wt 241.8 lb

## 2023-04-30 DIAGNOSIS — Z01812 Encounter for preprocedural laboratory examination: Secondary | ICD-10-CM | POA: Diagnosis present

## 2023-04-30 DIAGNOSIS — N1831 Chronic kidney disease, stage 3a: Secondary | ICD-10-CM | POA: Diagnosis not present

## 2023-04-30 DIAGNOSIS — E1122 Type 2 diabetes mellitus with diabetic chronic kidney disease: Secondary | ICD-10-CM | POA: Diagnosis not present

## 2023-04-30 DIAGNOSIS — E118 Type 2 diabetes mellitus with unspecified complications: Secondary | ICD-10-CM

## 2023-04-30 HISTORY — DX: Essential (primary) hypertension: I10

## 2023-04-30 LAB — BASIC METABOLIC PANEL
Anion gap: 9 (ref 5–15)
BUN: 14 mg/dL (ref 6–20)
CO2: 24 mmol/L (ref 22–32)
Calcium: 9 mg/dL (ref 8.9–10.3)
Chloride: 105 mmol/L (ref 98–111)
Creatinine, Ser: 1.77 mg/dL — ABNORMAL HIGH (ref 0.61–1.24)
GFR, Estimated: 43 mL/min — ABNORMAL LOW (ref >60)
Glucose, Bld: 110 mg/dL — ABNORMAL HIGH (ref 70–99)
Potassium: 5.3 mmol/L — ABNORMAL HIGH (ref 3.5–5.1)
Sodium: 138 mmol/L (ref 135–145)

## 2023-04-30 LAB — CBC WITH DIFFERENTIAL/PLATELET
Abs Immature Granulocytes: 0.03 10*3/uL (ref 0.00–0.07)
Basophils Absolute: 0.1 10*3/uL (ref 0.0–0.1)
Basophils Relative: 1 %
Eosinophils Absolute: 0.3 10*3/uL (ref 0.0–0.5)
Eosinophils Relative: 3 %
HCT: 39.2 % (ref 39.0–52.0)
Hemoglobin: 12.8 g/dL — ABNORMAL LOW (ref 13.0–17.0)
Immature Granulocytes: 0 %
Lymphocytes Relative: 53 %
Lymphs Abs: 4.9 10*3/uL — ABNORMAL HIGH (ref 0.7–4.0)
MCH: 31.1 pg (ref 26.0–34.0)
MCHC: 32.7 g/dL (ref 30.0–36.0)
MCV: 95.1 fL (ref 80.0–100.0)
Monocytes Absolute: 0.7 10*3/uL (ref 0.1–1.0)
Monocytes Relative: 7 %
Neutro Abs: 3.3 10*3/uL (ref 1.7–7.7)
Neutrophils Relative %: 36 %
Platelets: 309 10*3/uL (ref 150–400)
RBC: 4.12 MIL/uL — ABNORMAL LOW (ref 4.22–5.81)
RDW: 14.6 % (ref 11.5–15.5)
WBC: 9.3 10*3/uL (ref 4.0–10.5)
nRBC: 0 % (ref 0.0–0.2)

## 2023-04-30 NOTE — Telephone Encounter (Signed)
Called [facility] and left message to call back to office.Marland Kitchen

## 2023-04-30 NOTE — Telephone Encounter (Signed)
Pt left a Voicemail stating that he had not been contacted by the place that was to do his Xray  please check the status and call patient back

## 2023-05-01 NOTE — Telephone Encounter (Signed)
Called Mr Borsch and he was driving so I will call he tomorrow and give him the number to [facility] imaging. I had called them and they reported that they could not get in touch with him.

## 2023-05-01 NOTE — Progress Notes (Unsigned)
Name: Michael [name] DOB: Jun 27, 1960 MRN: [id number]  History of Present Illness: Mr. West is a 63 y.o. male who presents today at [facility] [place].  ***He is accompanied by ***. - GU history: 1. LUTS (urge incontinence). - ***Taking Flomax 0.4 mg daily. - 02/28/2023: PSA was normal (0.90).   Today: He reports urinary ***frequency, ***nocturia, ***urgency, and ***urge incontinence. Voiding ***x/day and ***x/night on average. Leaking ***x/day on average; using *** ***pads / ***diapers per day on average.  He {Actions; denies-reports:120008} prior attempted treatment for these symptoms ***including ***  He {Actions; denies-reports:120008} caffeine intake (*** caffeinated beverages per day on average).  He {Actions; denies-reports:120008} history of obstructive sleep apnea and {Actions; denies-reports:120008} wearing CPAP routinely. ***He denies ever having a sleep study before. He {Actions; denies-reports:120008} fluid intake within 3 hours prior to bedtime. He {Actions; denies-reports:120008} fluid intake during the night.  He {Actions; denies-reports:120008} caffeine intake within 8 hours prior to bedtime. He {Actions; denies-reports:120008} routinely experiencing lower extremity edema during the day. ***He {Actions; denies-reports:120008} elevating feet during the day and/or wearing compression socks when lower extremity edema is present during the day. ***He {Actions; denies-reports:120008} monitoring dietary salt and sodium intake.  He {Actions; denies-reports:120008} dysuria, gross hematuria, straining to void, or sensations of incomplete emptying.  He {Actions; denies-reports:120008} constipation.   ***BPH vs OAB ***DRE?   Fall Screening: Do you usually have a device to assist in your mobility? {yes/no:20286} ***cane / ***walker / ***wheelchair   Medications: Current Outpatient Medications  Medication Sig Dispense Refill   albuterol (PROVENTIL) (2.5  MG/3ML) 0.083% nebulizer solution Take 2.5 mg by nebulization every 6 (six) hours as needed.     albuterol (VENTOLIN HFA) 108 (90 Base) MCG/ACT inhaler Inhale 2 puffs into the lungs every 6 (six) hours as needed for wheezing or shortness of breath. 8 g 5   amLODipine (NORVASC) 10 MG tablet Take 1 tablet (10 mg total) by mouth daily. 30 tablet 6   amLODipine (NORVASC) 5 MG tablet Take 5 mg by mouth daily.     ARIPiprazole (ABILIFY) 10 MG tablet Take 10 mg by mouth daily.     aspirin EC 81 MG tablet Take 1 tablet (81 mg total) by mouth daily. Swallow whole. 90 tablet 3   budesonide-formoterol (SYMBICORT) 160-4.5 MCG/ACT inhaler Inhale 2 puffs into the lungs 2 (two) times daily. (Patient not taking: Reported on 04/24/2023) 1 each 12   clopidogrel (PLAVIX) 75 MG tablet take 4 tablets on day 1 and then 1 tablet daily starting on day 2. 90 tablet 0   cyanocobalamin (VITAMIN B12) 1000 MCG tablet Take 1,000 mcg by mouth daily.     cyclobenzaprine (FLEXERIL) 10 MG tablet Take 10 mg by mouth at bedtime.     Evolocumab (REPATHA SURECLICK) 140 MG/ML SOAJ Inject 140 mg into the skin every 14 (fourteen) days. Inject subcutaneously into abdomen, thigh, or upper arm every 14 days. Rotate injection sites and do not inject into areas where skin is tender, bruised, or red. Please contact [facility] for any questions or concerns regarding this medication. 12 mL 2   ezetimibe (ZETIA) 10 MG tablet Take 1 tablet (10 mg total) by mouth daily. (Patient not taking: Reported on 04/19/2023) 90 tablet 0   fenofibrate (TRICOR) 145 MG tablet Take 1 tablet (145 mg total) by mouth daily. 30 tablet 5   FLUoxetine (PROZAC) 20 MG capsule Take 20 mg by mouth every morning.     furosemide (LASIX) 20 MG tablet Take  1 tablet (20 mg total) by mouth daily as needed. 90 tablet 3   JARDIANCE 10 MG TABS tablet Take 1 tablet (10 mg total) by mouth daily. 30 tablet 5   meclizine (ANTIVERT) 25 MG tablet Take 25 mg by mouth 3 (three)  times daily.     methocarbamol (ROBAXIN) 750 MG tablet Take 750 mg by mouth every 8 (eight) hours as needed for muscle spasms.     metoprolol succinate (TOPROL-XL) 25 MG 24 hr tablet Take 1/2 tablet (12.5 mg total) by mouth every morning. 45 tablet 1   Nebulizer MISC machine     nitroGLYCERIN (NITROSTAT) 0.4 MG SL tablet Place 1 tablet (0.4 mg total) under the tongue every 5 (five) minutes as needed for chest pain. 25 tablet 1   Oxycodone HCl 10 MG TABS Take 10 mg by mouth every 6 (six) hours.     pregabalin (LYRICA) 200 MG capsule Take 200 mg by mouth in the morning, at noon, and at bedtime.     rosuvastatin (CRESTOR) 40 MG tablet Take 1 tablet (40 mg total) by mouth daily. 90 tablet 0   Study - EVOLVE-MI - evolocumab (REPATHA) 140 mg/mL SQ injection (PI-Stuckey) Inject 1 mL (140 mg total) into the skin every 14 (fourteen) days. For Investigational Use Only. Inject subcutaneously into abdomen, thigh, or upper arm every 14 days. Rotate injection sites and do not inject into areas where skin is tender, bruised, or red. Please contact [facility] for any questions or concerns regarding this medication. 12 mL 0   tamsulosin (FLOMAX) 0.4 MG CAPS capsule Take 0.4 mg by mouth daily.     zolpidem (AMBIEN) 10 MG tablet Take 10 mg by mouth at bedtime as needed for sleep.     No current facility-administered medications for this visit.    Allergies: Allergies  Allergen Reactions   Brilinta [Ticagrelor] Shortness Of Breath   Fentanyl Rash    Other Reaction(s): Dizziness   Methadone Other (See Comments)    REACTION: "Become mentally unstable"    Past Medical History:  Diagnosis Date   Allergic rhinitis    Arthritis    Chronic low back pain    follwed in [facility](Dr.Kirchmayer)   CKD (chronic kidney disease), stage II    Depression    Depression with anxiety    GERD (gastroesophageal reflux disease)    History of kidney stones    passed   HLD (hyperlipidemia) 03/23/2019    Hypertension    Insomnia    Migraines    NSTEMI (non-ST elevated myocardial infarction) (HCC) 11/2022   x2   Sarcoidosis    Multisystem,pulmonary and hepatic (Dr.Rourk)Liver bx in 06 mildly postive AMA but on re-check normal   Sleep apnea    "tested, but said it wasnt bad enough for a machine".   Past Surgical History:  Procedure Laterality Date   BACK SURGERY  2012   2nd back surgery 3 monthes ago and in 2007   CARDIAC CATHETERIZATION  05/20/2019   COLONOSCOPY  11/05/2003   [id number] internal hemorrhoids, otherwise normal rectum and colon   CORONARY/GRAFT ACUTE MI REVASCULARIZATION N/A 03/20/2019   Procedure: Coronary/Graft Acute MI Revascularization;  Surgeon: Lennette Bihari, MD;  Location: [facility] INVASIVE CV LAB;  Service: Cardiovascular;  Laterality: N/A;   CORONARY/GRAFT ACUTE MI REVASCULARIZATION N/A 06/10/2022   Procedure: Coronary/Graft Acute MI Revascularization;  Surgeon: Yvonne Kendall, MD;  Location: [facility] INVASIVE CV LAB;  Service: Cardiovascular;  Laterality: N/A;   ESOPHAGOGASTRODUODENOSCOPY  03/22/2004  [id number] esophagogastroduodenoscopy. Small hiatal hernia otherwise normal stomach   HARVEST BONE GRAFT Left    thigh   HIP ARTHROPLASTY Bilateral    left 2017, right 2016   HIP SURGERY Left 2017   bone graft   LAMINECTOMY  2009   Balateral L4-L5   LEFT HEART CATH AND CORONARY ANGIOGRAPHY N/A 03/20/2019   Procedure: LEFT HEART CATH AND CORONARY ANGIOGRAPHY;  Surgeon: Lennette Bihari, MD;  Location: [facility] INVASIVE CV LAB;  Service: Cardiovascular;  Laterality: N/A;   LEFT HEART CATH AND CORONARY ANGIOGRAPHY N/A 05/20/2019   Procedure: LEFT HEART CATH AND CORONARY ANGIOGRAPHY;  Surgeon: Marykay Lex, MD;  Location: [facility] INVASIVE CV LAB;  Service: Cardiovascular;  Laterality: N/A;   LEFT HEART CATH AND CORONARY ANGIOGRAPHY N/A 06/10/2022   Procedure: LEFT HEART CATH AND CORONARY ANGIOGRAPHY;  Surgeon: Yvonne Kendall, MD;  Location: [facility] INVASIVE CV LAB;  Service:  Cardiovascular;  Laterality: N/A;   RESECTION DISTAL CLAVICAL Right 01/23/2019   Procedure: OPEN DISTAL CLAVICLE EXCISION;  Surgeon: Vickki Hearing, MD;  Location: [facility];  Service: Orthopedics;  Laterality: Right;   TOOTH EXTRACTION Bilateral 12/28/2017   Procedure: DENTAL RESTORATION/EXTRACTIONS;  Surgeon: Ocie Doyne, DDS;  Location: [facility] OR;  Service: Oral Surgery;  Laterality: Bilateral;   Family History  Problem Relation Age of Onset   Colon cancer Brother        in his 31s   Heart failure Mother    Social History   Socioeconomic History   Marital status: Legally Separated    Spouse name: Not on file   Number of children: Not on file   Years of education: Not on file   Highest education level: Not on file  Occupational History   Not on file  Tobacco Use   Smoking status: Every Day    Current packs/day: 0.50    Average packs/day: 0.5 packs/day for 29.0 years (14.5 ttl pk-yrs)    Types: Cigarettes   Smokeless tobacco: Never   Tobacco comments:    Smokes 1/2 pack per day--09/20/20/ 3-4 cigs a day 06/01/22  Vaping Use   Vaping status: Never Used  Substance and Sexual Activity   Alcohol use: Not Currently   Drug use: No   Sexual activity: Yes  Other Topics Concern   Not on file  Social History Narrative   Are you right handed or left handed? left   Are you currently employed ?    What is your current occupation? disability   Do you live at home alone?yes   Who lives with you?    What type of home do you live in: 1 story or 2 story? one   Caffeine 1 soda a day    Social Drivers of Corporate investment banker Strain: Not on file  Food Insecurity: No Food Insecurity (06/10/2022)   Hunger Vital Sign    Worried About Running Out of Food in the Last Year: Never true    Ran Out of Food in the Last Year: Never true  Transportation Needs: No Transportation Needs (06/10/2022)   PRAPARE - Administrator, Civil Service (Medical): No    Lack of Transportation  (Non-Medical): No  Physical Activity: Not on file  Stress: Not on file  Social Connections: Not on file  Intimate Partner Violence: Not At Risk (06/10/2022)   Humiliation, Afraid, Rape, and Kick questionnaire    Fear of Current or Ex-Partner: No    Emotionally Abused: No    Physically  Abused: No    Sexually Abused: No    Review of Systems Constitutional: Patient denies any unintentional weight loss or change in strength lntegumentary: Patient denies any rashes or pruritus Cardiovascular: Patient denies chest pain or syncope Respiratory: Patient denies shortness of breath Gastrointestinal: Patient ***denies nausea, vomiting, constipation, or diarrhea Musculoskeletal: Patient denies muscle cramps or weakness Neurologic: Patient denies convulsions or seizures Allergic/Immunologic: Patient denies recent allergic reaction(s) Hematologic/Lymphatic: Patient denies bleeding tendencies Endocrine: Patient denies heat/cold intolerance  GU: As per HPI.  OBJECTIVE There were no vitals filed for this visit. There is no height or weight on file to calculate BMI.  Physical Examination Constitutional: No obvious distress; patient is non-toxic appearing  Cardiovascular: No visible lower extremity edema.  Respiratory: The patient does not have audible wheezing/stridor; respirations do not appear labored  Gastrointestinal: Abdomen non-distended Musculoskeletal: Normal ROM of UEs  Skin: No obvious rashes/open sores  Neurologic: CN 2-12 grossly intact Psychiatric: Answered questions appropriately with normal affect  Hematologic/Lymphatic/Immunologic: No obvious bruises or sites of spontaneous bleeding  UA: ***negative / *** WBC/hpf, *** RBC/hpf, *** bacteria ***Urine microscopy: ***negative / *** WBC/hpf, *** RBC/hpf, *** bacteria ***with no evidence of UTI ***with no evidence of microscopic hematuria ***otherwise unremarkable  PVR: *** ml  ASSESSMENT No diagnosis found. ***  We agreed  to plan for follow up in *** months / ***1 year or sooner if needed. Patient verbalized understanding of and agreement with current plan. All questions were answered.  PLAN Advised the following: 1. *** 2. ***No follow-ups on file.  No orders of the defined types were placed in this encounter.   It has been explained that the patient is to follow regularly with their PCP in addition to all other providers involved in their care and to follow instructions provided by these respective offices. Patient advised to contact urology clinic if any urologic-pertaining questions, concerns, new symptoms or problems arise in the interim period.  There are no Patient Instructions on file for this visit.  Electronically signed by:  Donnita Falls, FNP   05/01/23    9:58 AM

## 2023-05-02 ENCOUNTER — Encounter (HOSPITAL_COMMUNITY): Payer: Self-pay

## 2023-05-02 ENCOUNTER — Other Ambulatory Visit: Payer: Self-pay

## 2023-05-02 ENCOUNTER — Ambulatory Visit (HOSPITAL_COMMUNITY): Payer: 59

## 2023-05-02 DIAGNOSIS — M542 Cervicalgia: Secondary | ICD-10-CM

## 2023-05-02 DIAGNOSIS — M545 Low back pain, unspecified: Secondary | ICD-10-CM

## 2023-05-02 NOTE — Progress Notes (Signed)
Specialty Pharmacy Initial Fill Coordination Note  Michael [name] is a 63 y.o. male contacted today regarding initial fill of specialty medication(s) Evolocumab (Repatha SureClick)   Patient requested Delivery   Delivery date: 05/04/23   Verified address: [address]   Medication will be filled on 1/23.   Patient is aware of $0 copayment.
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
## 2023-05-03 ENCOUNTER — Ambulatory Visit (HOSPITAL_COMMUNITY): Payer: 59

## 2023-05-03 ENCOUNTER — Telehealth (HOSPITAL_COMMUNITY): Payer: Self-pay | Admitting: Physical Therapy

## 2023-05-03 ENCOUNTER — Encounter (HOSPITAL_COMMUNITY): Payer: 59 | Admitting: Physical Therapy

## 2023-05-03 ENCOUNTER — Ambulatory Visit (HOSPITAL_COMMUNITY)
Admission: RE | Admit: 2023-05-03 | Discharge: 2023-05-03 | Disposition: A | Discharge status: Home / Self Care | Payer: 59 | Attending: Internal Medicine | Admitting: Internal Medicine

## 2023-05-03 ENCOUNTER — Ambulatory Visit (HOSPITAL_BASED_OUTPATIENT_CLINIC_OR_DEPARTMENT_OTHER): Payer: 59 | Admitting: Anesthesiology

## 2023-05-03 ENCOUNTER — Other Ambulatory Visit (HOSPITAL_COMMUNITY): Payer: Self-pay | Admitting: *Deleted

## 2023-05-03 ENCOUNTER — Encounter (HOSPITAL_COMMUNITY)
Admission: RE | Disposition: A | Discharge status: Home / Self Care | Payer: Self-pay | Source: Home / Self Care | Attending: Internal Medicine

## 2023-05-03 ENCOUNTER — Encounter (HOSPITAL_COMMUNITY): Payer: Self-pay | Admitting: Internal Medicine

## 2023-05-03 ENCOUNTER — Ambulatory Visit (HOSPITAL_COMMUNITY): Payer: 59 | Admitting: Anesthesiology

## 2023-05-03 DIAGNOSIS — G8929 Other chronic pain: Secondary | ICD-10-CM | POA: Diagnosis not present

## 2023-05-03 DIAGNOSIS — N189 Chronic kidney disease, unspecified: Secondary | ICD-10-CM | POA: Insufficient documentation

## 2023-05-03 DIAGNOSIS — I252 Old myocardial infarction: Secondary | ICD-10-CM

## 2023-05-03 DIAGNOSIS — I6521 Occlusion and stenosis of right carotid artery: Secondary | ICD-10-CM | POA: Insufficient documentation

## 2023-05-03 DIAGNOSIS — Z79899 Other long term (current) drug therapy: Secondary | ICD-10-CM | POA: Insufficient documentation

## 2023-05-03 DIAGNOSIS — I2510 Atherosclerotic heart disease of native coronary artery without angina pectoris: Secondary | ICD-10-CM | POA: Insufficient documentation

## 2023-05-03 DIAGNOSIS — I4891 Unspecified atrial fibrillation: Secondary | ICD-10-CM | POA: Diagnosis not present

## 2023-05-03 DIAGNOSIS — F17210 Nicotine dependence, cigarettes, uncomplicated: Secondary | ICD-10-CM

## 2023-05-03 DIAGNOSIS — N183 Chronic kidney disease, stage 3 unspecified: Secondary | ICD-10-CM

## 2023-05-03 DIAGNOSIS — Z7982 Long term (current) use of aspirin: Secondary | ICD-10-CM | POA: Diagnosis not present

## 2023-05-03 DIAGNOSIS — I240 Acute coronary thrombosis not resulting in myocardial infarction: Secondary | ICD-10-CM

## 2023-05-03 DIAGNOSIS — I129 Hypertensive chronic kidney disease with stage 1 through stage 4 chronic kidney disease, or unspecified chronic kidney disease: Secondary | ICD-10-CM | POA: Diagnosis not present

## 2023-05-03 DIAGNOSIS — E785 Hyperlipidemia, unspecified: Secondary | ICD-10-CM | POA: Insufficient documentation

## 2023-05-03 HISTORY — PX: TEE WITHOUT CARDIOVERSION: SHX5443

## 2023-05-03 LAB — ECHO TEE: Est EF: 55

## 2023-05-03 LAB — GLUCOSE, CAPILLARY: Glucose-Capillary: 93 mg/dL (ref 70–99)

## 2023-05-03 SURGERY — ECHOCARDIOGRAM, TRANSESOPHAGEAL
Anesthesia: General

## 2023-05-03 MED ORDER — ORAL CARE MOUTH RINSE: 15.0000 mL | Freq: Once | OROMUCOSAL | Status: DC

## 2023-05-03 MED ORDER — BUTAMBEN-TETRACAINE-BENZOCAINE 2-2-14 % EX AERO
INHALATION_SPRAY | CUTANEOUS | Status: AC
  Filled 2023-05-03: qty 5

## 2023-05-03 MED ORDER — LACTATED RINGERS IV SOLN: INTRAVENOUS | Status: DC

## 2023-05-03 MED ORDER — CHLORHEXIDINE GLUCONATE 0.12 % MT SOLN: 15.0000 mL | Freq: Once | OROMUCOSAL | Status: DC

## 2023-05-03 MED ORDER — PROPOFOL 500 MG/50ML IV EMUL
INTRAVENOUS | Status: AC
  Filled 2023-05-03: qty 50

## 2023-05-03 MED ORDER — PERFLUTREN LIPID MICROSPHERE: 1.0000 mL | INTRAVENOUS | Status: DC | PRN

## 2023-05-03 MED ORDER — BUTAMBEN-TETRACAINE-BENZOCAINE 2-2-14 % EX AERO
INHALATION_SPRAY | CUTANEOUS | Status: DC | PRN
  Administered 2023-05-03: 1 via TOPICAL

## 2023-05-03 MED ORDER — LIDOCAINE HCL (CARDIAC) PF 100 MG/5ML IV SOSY
PREFILLED_SYRINGE | INTRAVENOUS | Status: DC | PRN
Start: 2023-05-03 — End: 2023-05-03
  Administered 2023-05-03: 80 mg via INTRATRACHEAL

## 2023-05-03 MED ORDER — LIDOCAINE HCL (PF) 2 % IJ SOLN
INTRAMUSCULAR | Status: AC
  Filled 2023-05-03: qty 5

## 2023-05-03 MED ORDER — SODIUM CHLORIDE 0.9 % IV SOLN: INTRAVENOUS | Status: DC

## 2023-05-03 MED ORDER — PROPOFOL 500 MG/50ML IV EMUL
INTRAVENOUS | Status: DC | PRN
  Administered 2023-05-03: 200 ug/kg/min via INTRAVENOUS

## 2023-05-03 NOTE — Telephone Encounter (Signed)
Pt did not show for appt. Unable to reach by phone.  Yuliya Nova B Hamdan Toscano, PTA/CLT [facility] Ph: [phone]  

## 2023-05-03 NOTE — Progress Notes (Signed)
*  PRELIMINARY RESULTS* Echocardiogram Echocardiogram Transesophageal has been performed with Definity and saline bubble study.  Michael [name] 05/03/2023, 2:17 PM

## 2023-05-03 NOTE — Anesthesia Procedure Notes (Signed)
Date/Time: 05/03/2023 12:57 PM  Performed by: Franco Nones, CRNAPre-anesthesia Checklist: Patient identified, Emergency Drugs available, Suction available, Timeout performed and Patient being monitored Patient Re-evaluated:Patient Re-evaluated prior to induction Oxygen Delivery Method: Nasal cannula Comments: Optiflow

## 2023-05-03 NOTE — Transfer of Care (Signed)
Immediate Anesthesia Transfer of Care Note  Patient: Michael [name]  Procedure(s) Performed: TRANSESOPHAGEAL ECHOCARDIOGRAM (TEE)  Patient Location: PACU  Anesthesia Type:General  Level of Consciousness: awake and patient cooperative  Airway & Oxygen Therapy: Patient Spontanous Breathing  Post-op Assessment: Report given to RN and Post -op Vital signs reviewed and stable  Post vital signs: Reviewed and stable  Last Vitals:  Vitals Value Taken Time  BP 123/60 05/03/23  1359  Temp 97.5 05/03/23  1359  Pulse 87 05/03/23  1359  Resp 26 05/03/23   1359  SpO2 100 05/03/23   1359    Last Pain:  Vitals:   05/03/23 1059  PainSc: 7       Patients Stated Pain Goal: 6 (05/03/23 1059)  Complications: No notable events documented.

## 2023-05-03 NOTE — Anesthesia Preprocedure Evaluation (Signed)
Anesthesia Evaluation  Patient identified by MRN, date of birth, ID band Patient awake    Reviewed: Allergy & Precautions, H&P , NPO status , Patient's Chart, lab work & pertinent test results, reviewed documented beta blocker date and time   Airway Mallampati: II  TM Distance: >3 FB Neck ROM: full    Dental no notable dental hx.    Pulmonary neg pulmonary ROS, sleep apnea , Current Smoker   Pulmonary exam normal breath sounds clear to auscultation       Cardiovascular Exercise Tolerance: Good hypertension, + Past MI  negative cardio ROS  Rhythm:regular Rate:Normal     Neuro/Psych  Headaches PSYCHIATRIC DISORDERS Anxiety Depression     Neuromuscular disease negative neurological ROS  negative psych ROS   GI/Hepatic negative GI ROS, Neg liver ROS,GERD  ,,  Endo/Other  negative endocrine ROSdiabetes    Renal/GU Renal diseasenegative Renal ROS  negative genitourinary   Musculoskeletal   Abdominal   Peds  Hematology negative hematology ROS (+)   Anesthesia Other Findings   Reproductive/Obstetrics negative OB ROS                             Anesthesia Physical Anesthesia Plan  ASA: 3  Anesthesia Plan: General   Post-op Pain Management:    Induction:   PONV Risk Score and Plan: Propofol infusion  Airway Management Planned:   Additional Equipment:   Intra-op Plan:   Post-operative Plan:   Informed Consent: I have reviewed the patients History and Physical, chart, labs and discussed the procedure including the risks, benefits and alternatives for the proposed anesthesia with the patient or authorized representative who has indicated his/her understanding and acceptance.     Dental Advisory Given  Plan Discussed with: CRNA  Anesthesia Plan Comments:        Anesthesia Quick Evaluation

## 2023-05-03 NOTE — CV Procedure (Signed)
   TRANSESOPHAGEAL ECHOCARDIOGRAM   NAME:  Michael [name]    MRN: [id number] DOB:  [birth year]    ADMIT DATE: 05/03/2023  INDICATIONS: Recurrent myocardial infarctions, concern for coronary embolism at the time of PCI. Hypercoagulable work-up was negative.  PROCEDURE:  Informed consent was obtained prior to the procedure. After a procedural time-out, the oropharynx was anesthetized and the patient was sedated by the anesthesia service. The transesophageal probe was inserted in the esophagus and stomach without difficulty and multiple views were obtained. Anesthesia was monitored by Dr. Johnnette Litter.   KEY FINDINGS: No evidence of LAA thrombus.   [name], MD [place]  [facility]  1:57 PM

## 2023-05-03 NOTE — Interval H&P Note (Signed)
History and Physical Interval Note:  05/03/2023 1:55 PM  Michael [name]  has presented today for surgery, with the diagnosis of recurrent myocardial infarctions, r/o coronary embolism.  The various methods of treatment have been discussed with the patient and family. After consideration of risks, benefits and other options for treatment, the patient has consented to  Procedure(s): TRANSESOPHAGEAL ECHOCARDIOGRAM (TEE) (N/A) as a surgical intervention.  The patient's history has been reviewed, patient examined, no change in status, stable for surgery.  I have reviewed the patient's chart and labs.  Questions were answered to the patient's satisfaction.     [name], MD [facility]

## 2023-05-04 ENCOUNTER — Encounter (HOSPITAL_COMMUNITY): Payer: Self-pay | Admitting: Internal Medicine

## 2023-05-04 NOTE — Anesthesia Postprocedure Evaluation (Signed)
Anesthesia Post Note  Patient: Michael [name]  Procedure(s) Performed: TRANSESOPHAGEAL ECHOCARDIOGRAM (TEE)  Patient location during evaluation: Phase II Anesthesia Type: General Level of consciousness: awake Pain management: pain level controlled Vital Signs Assessment: post-procedure vital signs reviewed and stable Respiratory status: spontaneous breathing and respiratory function stable Cardiovascular status: blood pressure returned to baseline and stable Postop Assessment: no headache and no apparent nausea or vomiting Anesthetic complications: no Comments: Late entry   No notable events documented.   Last Vitals:  Vitals:   05/03/23 1415 05/03/23 1432  BP: 123/80 122/71  Pulse: 77 75  Resp: 14 15  Temp:  (!) 36.4 C  SpO2: 100% 98%    Last Pain:  Vitals:   05/03/23 1432  TempSrc: Oral  PainSc: 0-No pain                 Windell Norfolk

## 2023-05-07 ENCOUNTER — Ambulatory Visit (HOSPITAL_COMMUNITY): Payer: 59 | Admitting: Physical Therapy

## 2023-05-07 DIAGNOSIS — M542 Cervicalgia: Secondary | ICD-10-CM

## 2023-05-07 DIAGNOSIS — M545 Low back pain, unspecified: Secondary | ICD-10-CM | POA: Diagnosis not present
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
## 2023-05-08 ENCOUNTER — Encounter: Payer: 59 | Admitting: Urology

## 2023-05-08 DIAGNOSIS — R399 Unspecified symptoms and signs involving the genitourinary system: Secondary | ICD-10-CM

## 2023-05-08 DIAGNOSIS — N3941 Urge incontinence: Secondary | ICD-10-CM

## 2023-05-09 ENCOUNTER — Ambulatory Visit (HOSPITAL_COMMUNITY): Payer: 59

## 2023-05-09 DIAGNOSIS — M545 Low back pain, unspecified: Secondary | ICD-10-CM | POA: Diagnosis not present

## 2023-05-09 DIAGNOSIS — M542 Cervicalgia: Secondary | ICD-10-CM
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
## 2023-05-11 ENCOUNTER — Other Ambulatory Visit: Payer: Self-pay | Admitting: Cardiology

## 2023-05-14 ENCOUNTER — Ambulatory Visit (HOSPITAL_COMMUNITY): Payer: 59 | Attending: Neurology

## 2023-05-14 DIAGNOSIS — M545 Low back pain, unspecified: Secondary | ICD-10-CM | POA: Diagnosis present

## 2023-05-14 DIAGNOSIS — M542 Cervicalgia: Secondary | ICD-10-CM
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
## 2023-05-16 ENCOUNTER — Ambulatory Visit (HOSPITAL_COMMUNITY): Payer: 59

## 2023-05-16 ENCOUNTER — Encounter: Payer: Self-pay | Admitting: *Deleted

## 2023-05-16 DIAGNOSIS — Z006 Encounter for examination for normal comparison and control in clinical research program: Secondary | ICD-10-CM

## 2023-05-16 DIAGNOSIS — M545 Low back pain, unspecified: Secondary | ICD-10-CM

## 2023-05-16 DIAGNOSIS — M542 Cervicalgia: Secondary | ICD-10-CM

## 2023-05-16 NOTE — Research (Signed)
 487 week Follow-Up Visit Completed*   []  Not Necessary, No Potential Adverse Events Or Medication Issues Reported On Completed Subject Questionnaire   [x]  Yes, Contact With Subject/Alternate Contact Completed   []  Yes, No Contact With Subject/Alternate Contact Completed, But Electronic Health Record Was Reviewed   []  No, Unable To Contact Subject/Alternate Contact   Have you reviewed Ongoing medications on the Targeted Concomitant Medication form and updated the form as needed?   [x]  Yes   []  No   Subject Status*   [x]  Continuing In Follow-up   []  At Risk For Lost To Follow-up   []  Withdrawal From All Future Study Activities Including Passive Follow-up By Electronic Health Record Review Or Contact With Healthcare Provider Or Family Member/Friend   []  Death   Vital Status*   [x]  Alive   []  Deceased   []  Unknown   Last Known To Be Alive Source*   [x]  Subject Completed Follow-up Questionnaire/Seen In Person/Via Telephone Contact   []  Family Member or Caretaker   []  Primary Physician Or Medical Records   []  Publicly Available Source   []  Other  Date of last dose taken   09-May-2023 Over the last 12 weeks did the subject miss any doses? no  Over the last 12 weeks did the subject restart Evolocumab  after an interruption?no
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
## 2023-05-22 ENCOUNTER — Ambulatory Visit (HOSPITAL_COMMUNITY): Payer: 59

## 2023-05-22 DIAGNOSIS — M545 Low back pain, unspecified: Secondary | ICD-10-CM | POA: Diagnosis not present

## 2023-05-22 DIAGNOSIS — M542 Cervicalgia: Secondary | ICD-10-CM
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
## 2023-05-23 ENCOUNTER — Ambulatory Visit: Payer: 59 | Admitting: Nurse Practitioner

## 2023-05-30 ENCOUNTER — Telehealth: Payer: Self-pay

## 2023-05-30 NOTE — Telephone Encounter (Addendum)
Tried calling patient several time to rescheduled appointment with no answer. Someone pick up and hang up. Letter mailed out making patient aware of scheduled change along with appointment reminder.

## 2023-05-31 ENCOUNTER — Ambulatory Visit: Payer: 59 | Admitting: Urology

## 2023-06-11 ENCOUNTER — Other Ambulatory Visit: Payer: Self-pay | Admitting: Cardiology

## 2023-06-18 ENCOUNTER — Ambulatory Visit: Payer: 59 | Admitting: Nurse Practitioner

## 2023-06-18 NOTE — Progress Notes (Deleted)
 Cardiology Office Note:  .   Date:  06/18/2023  ID:  Michael [name], DOB May 23, 1960, MRN [id number] PCP: Elmer Picker, [facility] Providers Cardiologist:  [name], MD    History of Present Illness: .   Michael [name] is a 63 y.o. male with a PMH of CAD, s/p STEMI, HLD, HTN, carotid artery stenosis, hx of sarcoid, tobacco use, dizziness, chronic pain, CKD, who presents today for 3 month follow-up. 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 Followed by Hem/Onc, has had hypercoagulable workup that has been reported to be negative.   Admission in 2020 with STEMI, cardiac cath revealed occluded dLAD and OM1, probable embolic occlusion, spontaneous reperfusion of LAD, low level PTCA of dLCX. Source of emboli was unclear, no A-fib noted at the time, no significant finding on Echo. Admission in 2021 with CP, trop peak 5300, cardiac cath did not show significant dx, cMRI showed EF 56%, inferolateral hypokinesis with delayed enhancement consistent with scar.   Admission 06/2022 with inferior STEMI. Cardiac cath revealed mPDA occlusion, unsuccessful attempt at PTCA. TTE EF 50%. Thought that event was possible recurrent cardio embolic event. Was referred to hematology for hypercoagulable workup given multiple events. Hem note 07/2022 says workup was negative.   Last seen by Dr. [name] on January 15, 2023. Dr. Wyline Mood consulted Dr. Elbert Ewings who stated that because patient's hypercoagulable workup was negative, was okay to proceed with TEE/loop recorder. TEE was arranged, but was canceled as pt no showed.   Today he presents for follow-up. Doing well. Does admit to some wheezing d/t cold weather, but denies any acute cardiac complaints or issues. Denies any chest pain, shortness of breath, palpitations, syncope, presyncope, dizziness, orthopnea, PND, swelling or significant weight changes, acute bleeding, or claudication. Admits to smoking and  stress, denies any red flag signs/symptoms.   ROS: Negative. See HPI.   Studies Reviewed: .    Carotid duplex 08/2022:  IMPRESSION: 1. Moderate-to-large amount of right-sided atherosclerotic plaque results in elevated peak systolic velocities within the right internal carotid artery compatible with the 50-69% luminal narrowing range. Further evaluation with CTA could be performed as indicated. 2. Moderate amount of left-sided atherosclerotic plaque, not resulting in a hemodynamically significant stenosis.  Echo 06/2022:  1. Left ventricular ejection fraction, by estimation, is 50%. The left  ventricle has low normal function. The left ventricle demonstrates  regional wall motion abnormalities (see scoring diagram/findings for  description). There is mild asymmetric left  ventricular hypertrophy of the septal segment. Left ventricular diastolic  parameters were grossly normal.   2. Right ventricular systolic function is normal. The right ventricular  size is normal. Tricuspid regurgitation signal is inadequate for assessing  PA pressure.   3. The mitral valve is normal in structure. Trivial mitral valve  regurgitation. No evidence of mitral stenosis.   4. The aortic valve is tricuspid. There is mild thickening of the aortic  valve. Aortic valve regurgitation is not visualized. No aortic stenosis is  present.   5. The inferior vena cava is normal in size with greater than 50%  respiratory variability, suggesting right atrial pressure of 3 mmHg.  LHC 06/2022:  Conclusions: Severe single vessel coronary artery disease with occlusion of mid rPDA.  Appearance is similar to distal LAD and LCx occlusions seen in 2020 and may reflect recurrent coronary embolism.  Mild RCA stenosis is similar to prior catheterization in 2021. Mildly elevated left ventricular filling pressure (LVEDP 20  mmHg). Attempted PTCA to rPDA occlusion; flow could not be reestablished despite multiple inflations with a  2.0 x 12 mm balloon.   Recommendations: Tirofiban infusion for 18 hours. Titrate nitroglycerin infusion for relief of chest pain. Dual antiplatelet therapy with aspirin and ticagrelor for at least 12 months. Obtain echocardiogram. Consider hypercoagulable workup and TEE/ILR to assess for potential causes of recurrent coronary occlusions suspicious for emboli. Aggressive secondary prevention of coronary artery disease.   cMRI 05/2019:  IMPRESSION: 1. Normal left ventricular size with EF 56%, severe hypokinesis to akinesis of the mid inferolateral wall extending into a portion of the mid inferior wall.   2.  Normal RV size and systolic function, EF 51%.   3. Delayed enhancement pattern in the inferolateral wall matches the hypokinesis on cine images and looks like a coronary disease pattern (prior MI).   LHC 05/2019:  There is moderate to severe left ventricular systolic dysfunction with regional and global hypokinesis. Ejection Fraction (EF) appears to be 25-35% by visual estimate. LV end diastolic pressure is mildly elevated. The previously occluded 1st Mrg treated with PTCA is widely patent. Previously occluded Dist LAD lesionis now widely patent Ost RCA lesion is 20% stenosed.   SUMMARY Previously occluded distal circumflex is widely patent as is the LAD.  Mild ostial RCA but otherwise normal, tortuous vessel. Dilated Left Ventricle with global hypokinesis worse in the inferior and anterior apex.  EF estimated 30 of 35%. Suspect NONISCHEMIC CARDIOMYOPATHY     RECOMMENDATIONS Recommend checking 2D echocardiogram for better assessment of EF and wall motion. Evaluate for causes of Nonischemic Cardiomyopathy with elevated troponin -> consider myocarditis. Further plans per primary service -> would potentially consider cardiac MRI with concern for myocarditis.  Cardiac monitor 05/2019:  30 day event monitor Tracings primarily show sinus rhythm and sinus tachycardia. Short 4 beat  run of atach. Short runs of NSVT up to 12 beats. No atrial fibrillation or atrial flutter noted  LHC 03/2019: Dist LAD lesion is 100% stenosed. 1st Mrg lesion is 100% stenosed. The left ventricular systolic function is normal. The left ventricular ejection fraction is 50-55% by visual estimate. Post intervention, there is a 0% residual stenosis.   Probable embolic occlusion of the distal OM branch of the circumflex as well as distal LAD.   Normal RCA with superior takeoff.   Low normal global LV function with EF estimated 50 to 55% and focal area of mid anterolateral and focal apical hypocontractility.   Low level PTCA of the distal circumflex with improvement of flow from TIMI 0 to TIMI I in a small caliber distal vessel.   Spontaneous reperfusion of the LAD which ultimately wraps around the LV apex.   RECOMMENDATION: Aggrastat was started due to high suspect for embolic etiology rather than atherosclerotic plaque; continue for 18 hours post procedure.  DAPT with aspirin/Brilinta.  Smoking cessation is imperative.  We will plan for echo Doppler study in a.m.   Physical Exam:   VS:  There were no vitals taken for this visit.   Wt Readings from Last 3 Encounters:  05/03/23 241 lb 10 oz (109.6 kg)  04/30/23 241 lb 12.8 oz (109.7 kg)  04/19/23 241 lb 12.8 oz (109.7 kg)    GEN: Well nourished, well developed in no acute distress NECK: No JVD; No carotid bruits CARDIAC: S1/S2, RRR, no murmurs, rubs, gallops RESPIRATORY:  Clear to auscultation without rales, wheezing or rhonchi  ABDOMEN: Soft, non-tender, non-distended EXTREMITIES:  No edema; No deformity   ASSESSMENT  AND PLAN: .    CAD, hx of recurrent MI's, hx of STEMI Denies any chest pain. His previous hx of recurrent MI's appeared to be embolic, unclear source. Dr. [name] previously recommended that if patient's hypercoagulable workup was negative, would likely plan for TEE. This was previously arranged by Dr. Wyline Mood,  however pt no showed. Discussed/reviewed risks vs benefits of TEE for further evaluation, and he verbalized understanding and is agreeable to proceed. See below. Will obtain CBC and BMET prior to procedure. Continue current medication regimen. Heart healthy diet and regular cardiovascular exercise encouraged. Care and ED precautions discussed. Orders are under signed and held.     Informed Consent   Shared Decision Making/Informed Consent   The risks [esophageal damage, perforation (1:10,000 risk), bleeding, pharyngeal hematoma as well as other potential complications associated with conscious sedation including aspiration, arrhythmia, respiratory failure and death], benefits (treatment guidance and diagnostic support) and alternatives of a transesophageal echocardiogram were discussed in detail with Mr. Skeens and he is willing to proceed.      2. Carotid artery stenosis, HLD Denies any symptoms. Carotid duplex 08/2022 revealed moderate to large amount of plaque along right ICA compatible with 50-69% range. It was suggested that further evaluation with CTA could be performed as indicated, also noted to have moderate amount of left sided plaque, was not found to be hemodynamically significant. Continue aspirin, plavix, Zetia, Repatha, and rosuvastatin.   3. HTN BP stable. Discussed to monitor BP at home at least 2 hours after medications and sitting for 5-10 minutes. Continue current medication regimen. Heart healthy diet and regular cardiovascular exercise encouraged.   4. Tobacco abuse Smoking cessation encouraged and discussed.   Dispo: Will provide refills per his request. Follow-up with me/APP in 4 weeks or sooner if anything changes.   Signed, Sharlene Dory, NP

## 2023-06-21 ENCOUNTER — Encounter: Payer: Self-pay | Admitting: Nurse Practitioner

## 2023-06-21 ENCOUNTER — Ambulatory Visit: Attending: Nurse Practitioner | Admitting: Nurse Practitioner

## 2023-06-21 VITALS — BP 138/88 | HR 86 | Ht 72.0 in | Wt 230.0 lb

## 2023-06-21 DIAGNOSIS — E785 Hyperlipidemia, unspecified: Secondary | ICD-10-CM

## 2023-06-21 DIAGNOSIS — Z87898 Personal history of other specified conditions: Secondary | ICD-10-CM

## 2023-06-21 DIAGNOSIS — R55 Syncope and collapse: Secondary | ICD-10-CM

## 2023-06-21 DIAGNOSIS — I2510 Atherosclerotic heart disease of native coronary artery without angina pectoris: Secondary | ICD-10-CM | POA: Diagnosis not present

## 2023-06-21 DIAGNOSIS — I10 Essential (primary) hypertension: Secondary | ICD-10-CM

## 2023-06-21 DIAGNOSIS — I6523 Occlusion and stenosis of bilateral carotid arteries: Secondary | ICD-10-CM | POA: Diagnosis not present

## 2023-06-21 DIAGNOSIS — N1831 Chronic kidney disease, stage 3a: Secondary | ICD-10-CM

## 2023-06-21 DIAGNOSIS — I252 Old myocardial infarction: Secondary | ICD-10-CM | POA: Diagnosis not present

## 2023-06-21 DIAGNOSIS — Z720 Tobacco use: Secondary | ICD-10-CM

## 2023-06-21 DIAGNOSIS — R42 Dizziness and giddiness: Secondary | ICD-10-CM

## 2023-06-21 DIAGNOSIS — I255 Ischemic cardiomyopathy: Secondary | ICD-10-CM

## 2023-06-21 NOTE — Patient Instructions (Addendum)
 Medication Instructions:  Your physician has recommended you make the following change in your medication:  Please reduce Amlodipine to 10 Mg daily in the morning   Labwork: In 1-2 weeks   Testing/Procedures: None   Follow-Up: Your physician recommends that you schedule a follow-up appointment in: 6-8 weeks   Any Other Special Instructions Will Be Listed Below (If Applicable).   If you need a refill on your cardiac medications before your next appointment, please call your pharmacy.

## 2023-06-21 NOTE — Progress Notes (Signed)
 Cardiology Office Note:  .   Date: 06/21/2023 ID:  Michael [name], DOB 04/21/1960, MRN [id number] PCP: Elmer Picker, [facility] Providers Cardiologist:  [name], MD    History of Present Illness: .   Michael [name] is a 63 y.o. male with a PMH of CAD, s/p STEMI, HLD, HTN, carotid artery stenosis, hx of sarcoid, tobacco use, dizziness, chronic pain, CKD, who presents today for 3 month follow-up. 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 Followed by Hem/Onc, has had hypercoagulable workup that has been reported to be negative.   Admission in 2020 with STEMI, cardiac cath revealed occluded dLAD and OM1, probable embolic occlusion, spontaneous reperfusion of LAD, low level PTCA of dLCX. Source of emboli was unclear, no A-fib noted at the time, no significant finding on Echo. Admission in 2021 with CP, trop peak 5300, cardiac cath did not show significant dx, cMRI showed EF 56%, inferolateral hypokinesis with delayed enhancement consistent with scar.   Admission 06/2022 with inferior STEMI. Cardiac cath revealed mPDA occlusion, unsuccessful attempt at PTCA. TTE EF 50%. Thought that event was possible recurrent cardio embolic event. Was referred to hematology for hypercoagulable workup given multiple events. Hem note 07/2022 says workup was negative.   Last seen by Dr. [name] on January 15, 2023. Dr. Wyline Mood consulted Dr. Elbert Ewings who stated that because patient's hypercoagulable workup was negative, was okay to proceed with TEE/loop recorder. TEE was arranged, but was canceled as pt no showed.   Underwent TEE on May 03, 2023 and revealed EF 55%, no left atrial/left appendage thrombus detected, no significant valvular abnormalities, negative contrast bubble study, no evidence of interatrial shunt.   Today he presents for follow-up. Currently having bowel incontinence issues, not sure of the cause of this. Admits to recent stress.  Denies any chest pain, shortness of breath, palpitations, presyncope, orthopnea, PND, swelling or significant weight changes, acute bleeding, or claudication. Admits to a syncopal episode around 4 months ago, says he had a syncopal episode after speaking with some friends, etiology unclear. Does admit to daily dizziness, denies any particular orthostatic symptoms. Says dizziness affects his ability to drive, says he doesn't drive too far due to this.   ROS: Negative. See HPI.   Studies Reviewed: Marland Kitchen    EKG: EKG Interpretation Date/Time:  Thursday June 21 2023 14:02:43 EDT Ventricular Rate:  83 PR Interval:  178 QRS Duration:  86 QT Interval:  378 QTC Calculation: 444 R Axis:   13  Text Interpretation: Normal sinus rhythm Inferior infarct (cited on or before 11-Jun-2022) When compared with ECG of 11-Jun-2022 07:04, Criteria for Lateral infarct are no longer Present Questionable change in initial forces of Inferior leads Nonspecific T wave abnormality no longer evident in Anterolateral leads Confirmed by Sharlene Dory [phone]) on 06/21/2023 2:05:23 PM   Carotid duplex 08/2022:  IMPRESSION: 1. Moderate-to-large amount of right-sided atherosclerotic plaque results in elevated peak systolic velocities within the right internal carotid artery compatible with the 50-69% luminal narrowing range. Further evaluation with CTA could be performed as indicated. 2. Moderate amount of left-sided atherosclerotic plaque, not resulting in a hemodynamically significant stenosis.  Echo 06/2022:  1. Left ventricular ejection fraction, by estimation, is 50%. The left  ventricle has low normal function. The left ventricle demonstrates  regional wall motion abnormalities (see scoring diagram/findings for  description). There is mild asymmetric left  ventricular hypertrophy of the septal segment. Left ventricular diastolic  parameters were grossly  normal.   2. Right ventricular systolic function is normal. The  right ventricular  size is normal. Tricuspid regurgitation signal is inadequate for assessing  PA pressure.   3. The mitral valve is normal in structure. Trivial mitral valve  regurgitation. No evidence of mitral stenosis.   4. The aortic valve is tricuspid. There is mild thickening of the aortic  valve. Aortic valve regurgitation is not visualized. No aortic stenosis is  present.   5. The inferior vena cava is normal in size with greater than 50%  respiratory variability, suggesting right atrial pressure of 3 mmHg.  LHC 06/2022:  Conclusions: Severe single vessel coronary artery disease with occlusion of mid rPDA.  Appearance is similar to distal LAD and LCx occlusions seen in 2020 and may reflect recurrent coronary embolism.  Mild RCA stenosis is similar to prior catheterization in 2021. Mildly elevated left ventricular filling pressure (LVEDP 20 mmHg). Attempted PTCA to rPDA occlusion; flow could not be reestablished despite multiple inflations with a 2.0 x 12 mm balloon.   Recommendations: Tirofiban infusion for 18 hours. Titrate nitroglycerin infusion for relief of chest pain. Dual antiplatelet therapy with aspirin and ticagrelor for at least 12 months. Obtain echocardiogram. Consider hypercoagulable workup and TEE/ILR to assess for potential causes of recurrent coronary occlusions suspicious for emboli. Aggressive secondary prevention of coronary artery disease.   cMRI 05/2019:  IMPRESSION: 1. Normal left ventricular size with EF 56%, severe hypokinesis to akinesis of the mid inferolateral wall extending into a portion of the mid inferior wall.   2.  Normal RV size and systolic function, EF 51%.   3. Delayed enhancement pattern in the inferolateral wall matches the hypokinesis on cine images and looks like a coronary disease pattern (prior MI).   LHC 05/2019:  There is moderate to severe left ventricular systolic dysfunction with regional and global hypokinesis. Ejection  Fraction (EF) appears to be 25-35% by visual estimate. LV end diastolic pressure is mildly elevated. The previously occluded 1st Mrg treated with PTCA is widely patent. Previously occluded Dist LAD lesionis now widely patent Ost RCA lesion is 20% stenosed.   SUMMARY Previously occluded distal circumflex is widely patent as is the LAD.  Mild ostial RCA but otherwise normal, tortuous vessel. Dilated Left Ventricle with global hypokinesis worse in the inferior and anterior apex.  EF estimated 30 of 35%. Suspect NONISCHEMIC CARDIOMYOPATHY     RECOMMENDATIONS Recommend checking 2D echocardiogram for better assessment of EF and wall motion. Evaluate for causes of Nonischemic Cardiomyopathy with elevated troponin -> consider myocarditis. Further plans per primary service -> would potentially consider cardiac MRI with concern for myocarditis.  Cardiac monitor 05/2019:  30 day event monitor Tracings primarily show sinus rhythm and sinus tachycardia. Short 4 beat run of atach. Short runs of NSVT up to 12 beats. No atrial fibrillation or atrial flutter noted  LHC 03/2019: Dist LAD lesion is 100% stenosed. 1st Mrg lesion is 100% stenosed. The left ventricular systolic function is normal. The left ventricular ejection fraction is 50-55% by visual estimate. Post intervention, there is a 0% residual stenosis.   Probable embolic occlusion of the distal OM branch of the circumflex as well as distal LAD.   Normal RCA with superior takeoff.   Low normal global LV function with EF estimated 50 to 55% and focal area of mid anterolateral and focal apical hypocontractility.   Low level PTCA of the distal circumflex with improvement of flow from TIMI 0 to TIMI I in a small caliber  distal vessel.   Spontaneous reperfusion of the LAD which ultimately wraps around the LV apex.   RECOMMENDATION: Aggrastat was started due to high suspect for embolic etiology rather than atherosclerotic plaque; continue for  18 hours post procedure.  DAPT with aspirin/Brilinta.  Smoking cessation is imperative.  We will plan for echo Doppler study in a.m.   Physical Exam:   VS:  BP 138/88   Pulse 86   Ht 6' (1.829 m)   Wt 230 lb (104.3 kg)   SpO2 98%   BMI 31.19 kg/m    Wt Readings from Last 3 Encounters:  06/21/23 230 lb (104.3 kg)  05/03/23 241 lb 10 oz (109.6 kg)  04/30/23 241 lb 12.8 oz (109.7 kg)    Orthostatics: Lying: 121/80, 88 bpm Sitting: 121/83, 91 bpm Standing: 117/75, 99 bpm Standing x 3 minutes: 125/86, 102 bpm  GEN: Well nourished, well developed in no acute distress NECK: No JVD; No carotid bruits CARDIAC: S1/S2, RRR, no murmurs, rubs, gallops RESPIRATORY:  Clear to auscultation without rales, wheezing or rhonchi  ABDOMEN: Soft, non-tender, non-distended EXTREMITIES:  No edema; No deformity   ASSESSMENT AND PLAN: .    CAD, hx of recurrent MI's, hx of STEMI Denies any chest pain. His previous hx of recurrent MI's appeared to be embolic, unclear source. Dr. [name] previously recommended that if patient's hypercoagulable workup was negative, would likely plan for TEE. Benign TEE report noted above. Also recommended loop recorder. Will refer to EP for loop recorder and syncope evaluation. Orthostatics negative today.  Continue current medication regimen. Heart healthy diet and regular cardiovascular exercise encouraged. Care and ED precautions discussed. Will obtain CBC.   2. Carotid artery stenosis, HLD Denies any symptoms. Carotid duplex 08/2022 revealed moderate to large amount of plaque along right ICA compatible with 50-69% range. It was suggested that further evaluation with CTA could be performed as indicated, also noted to have moderate amount of left sided plaque, was not found to be hemodynamically significant. Continue aspirin, plavix, Zetia, Repatha, and rosuvastatin. Will obtain CMET and FLP.   3. HTN BP stable. Discussed to monitor BP at home at least 2 hours after  medications and sitting for 5-10 minutes. Continue current medication regimen. He reported taking 15 mg of Amlodipine daily, instructed him to stop taking the additional 5 mg tablet and take 10 mg daily. Heart healthy diet and regular cardiovascular exercise encouraged.   4. Hx of syncope, dizziness Denies any recent syncopal episodes. Last episode was around 4 months ago. Does admit to daily dizziness. Orthostatics negative today. Etiology unclear. Providing referral to EP for loop recorder. Conservative measures as well as care and ED precautions discussed. Discussed to avoid driving for at least 6 months.   5. Tobacco abuse Smoking cessation encouraged and discussed.   Dispo: Will provide refills per his request. Follow-up with me/APP in 6-8 weeks or sooner if anything changes.   Signed, Sharlene Dory, NP
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
## 2023-06-25 ENCOUNTER — Emergency Department (HOSPITAL_COMMUNITY): Admission: EM | Admit: 2023-06-25 | Source: Home / Self Care

## 2023-06-25 NOTE — Progress Notes (Unsigned)
 Name: Michael [name] DOB: Sep 07, 1960 MRN: [id number]  History of Present Illness: Michael [name] is a 63 y.o. male who presents today as a new Michael [name] at [facility]. All available relevant medical records have been reviewed. Michael [name] is accompanied by Michael [name] partner Carollee Herter. - GU History: 1. LUTS (urge incontinence). - Taking Flomax 0.4 mg daily. - 02/28/2023: PSA was normal (0.90).   Today: Michael [name] reports that previously Michael [name] was having bothersome urinary frequency, nocturia, urgency, and urge incontinence. Michael [name] PCP started him on Flomax (Tamsulosin) 0.4 mg daily in January 2025 and since then Michael [name] reports that Michael [name] urinary symptoms have resolved. Michael [name] is now able to sleep through the night without waking to urinate and denies bothersome urinary frequency, urgency, or urge incontinence. Michael [name] reports minimal caffeine intake.  Michael [name] denies dysuria, gross hematuria, straining to void, or sensations of incomplete emptying.  Michael [name] reports history of kidney stones - states Michael [name] has mostly passed stones spontaneously but did have one ureteroscopic stone manipulation >20 years ago. Denies any recent stone episodes.   Michael [name] denies prior history of gross hematuria.  Michael [name] denies history of recent or recurrent UTI. Michael [name] denies history of GU malignancy or pelvic radiation.  Michael [name] denies history of autoimmune disease. Michael [name] denies any known personal history of sickle cell disease or sickle cell trait but does report that it is known to run in Michael [name] family. Michael [name] reports history of smoking (has smoked 0.5 ppd for >30 years). 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  Michael [name] reports taking anticoagulants (Aspirin 81 mg and Plavix).  Reports family history of prostate cancer (brother; deceased).  Medications: Current Outpatient Medications  Medication Sig Dispense Refill   albuterol (PROVENTIL) (2.5 MG/3ML) 0.083% nebulizer solution Take 2.5 mg by nebulization  every 6 (six) hours as needed.     albuterol (VENTOLIN HFA) 108 (90 Base) MCG/ACT inhaler Inhale 2 puffs into the lungs every 6 (six) hours as needed for wheezing or shortness of breath. 8 g 5   amLODipine (NORVASC) 10 MG tablet Take 1 tablet (10 mg total) by mouth daily. 30 tablet 6   ARIPiprazole (ABILIFY) 10 MG tablet Take 10 mg by mouth daily.     aspirin EC 81 MG tablet Take 81 mg by mouth daily. Swallow whole.     budesonide-formoterol (SYMBICORT) 160-4.5 MCG/ACT inhaler Inhale 2 puffs into the lungs 2 (two) times daily. 1 each 12   clopidogrel (PLAVIX) 75 MG tablet take 4 tablets on day 1 and then 1 tablet daily starting on day 2. 90 tablet 0   cyanocobalamin (VITAMIN B12) 1000 MCG tablet Take 1,000 mcg by mouth daily.     cyclobenzaprine (FLEXERIL) 10 MG tablet Take 10 mg by mouth at bedtime.     Evolocumab (REPATHA SURECLICK) 140 MG/ML SOAJ Inject 140 mg into the skin every 14 (fourteen) days. Inject subcutaneously into abdomen, thigh, or upper arm every 14 days. Rotate injection sites and do not inject into areas where skin is tender, bruised, or red. Please contact [facility] for any questions or concerns regarding this medication. 12 mL 2   ezetimibe (ZETIA) 10 MG tablet Take 1 tablet (10 mg total) by mouth daily. (Michael [name]: Reported on 04/19/2023) 90 tablet 0   fenofibrate (TRICOR) 145 MG tablet Take 1 tablet (145 mg total) by mouth daily. 30 tablet 5   FLUoxetine (PROZAC) 20 MG capsule Take 20 mg by mouth every morning.  furosemide (LASIX) 20 MG tablet Take 1 tablet (20 mg total) by mouth daily as needed. 90 tablet 3   JARDIANCE 10 MG TABS tablet Take 1 tablet (10 mg total) by mouth daily. 30 tablet 5   meclizine (ANTIVERT) 25 MG tablet Take 25 mg by mouth 3 (three) times daily.     methocarbamol (ROBAXIN) 750 MG tablet Take 750 mg by mouth every 8 (eight) hours as needed for muscle spasms.     metoprolol succinate (TOPROL-XL) 25 MG 24 hr tablet Take 1/2  tablet (12.5 mg total) by mouth every morning. 45 tablet 1   Nebulizer MISC machine     nitroGLYCERIN (NITROSTAT) 0.4 MG SL tablet Place 1 tablet (0.4 mg total) under the tongue every 5 (five) minutes as needed for chest pain. 25 tablet 1   Oxycodone HCl 10 MG TABS Take 10 mg by mouth every 6 (six) hours.     pregabalin (LYRICA) 200 MG capsule Take 200 mg by mouth in the morning, at noon, and at bedtime.     rosuvastatin (CRESTOR) 40 MG tablet Take 1 tablet (40 mg total) by mouth daily. 90 tablet 0   Study - EVOLVE-MI - evolocumab (REPATHA) 140 mg/mL SQ injection (PI-Stuckey) Inject 1 mL (140 mg total) into the skin every 14 (fourteen) days. For Investigational Use Only. Inject subcutaneously into abdomen, thigh, or upper arm every 14 days. Rotate injection sites and do not inject into areas where skin is tender, bruised, or red. Please contact [facility] for any questions or concerns regarding this medication. 12 mL 0   tamsulosin (FLOMAX) 0.4 MG CAPS capsule Take 0.4 mg by mouth daily.     zolpidem (AMBIEN) 10 MG tablet Take 10 mg by mouth at bedtime as needed for sleep.     No current facility-administered medications for this visit.    Allergies: Allergies  Allergen Reactions   Brilinta [Ticagrelor] Shortness Of Breath   Fentanyl Rash    Other Reaction(s): Dizziness   Methadone Other (See Comments)    REACTION: "Become mentally unstable"    Past Medical History:  Diagnosis Date   Allergic rhinitis    Arthritis    Chronic low back pain    follwed in [facility](Dr.Kirchmayer)   CKD (chronic kidney disease), stage II    Depression    Depression with anxiety    GERD (gastroesophageal reflux disease)    History of kidney stones    passed   HLD (hyperlipidemia) 03/23/2019   Hypertension    Insomnia    Migraines    NSTEMI (non-ST elevated myocardial infarction) (HCC) 11/2022   x2   Sarcoidosis    Multisystem,pulmonary and hepatic (Dr.Rourk)Liver bx in 06  mildly postive AMA but on re-check normal   Sleep apnea    "tested, but said it wasnt bad enough for a machine".   Past Surgical History:  Procedure Laterality Date   BACK SURGERY  2012   2nd back surgery 3 monthes ago and in 2007   CARDIAC CATHETERIZATION  05/20/2019   COLONOSCOPY  11/05/2003   [id number] internal hemorrhoids, otherwise normal rectum and colon   CORONARY/GRAFT ACUTE MI REVASCULARIZATION N/A 03/20/2019   Procedure: Coronary/Graft Acute MI Revascularization;  Surgeon: Lennette Bihari, MD;  Location: [facility] INVASIVE CV LAB;  Service: Cardiovascular;  Laterality: N/A;   CORONARY/GRAFT ACUTE MI REVASCULARIZATION N/A 06/10/2022   Procedure: Coronary/Graft Acute MI Revascularization;  Surgeon: Yvonne Kendall, MD;  Location: [facility] INVASIVE CV LAB;  Service: Cardiovascular;  Laterality: N/A;  ESOPHAGOGASTRODUODENOSCOPY  03/22/2004   [id number] esophagogastroduodenoscopy. Small hiatal hernia otherwise normal stomach   HARVEST BONE GRAFT Left    thigh   HIP ARTHROPLASTY Bilateral    left 2017, right 2016   HIP SURGERY Left 2017   bone graft   LAMINECTOMY  2009   Balateral L4-L5   LEFT HEART CATH AND CORONARY ANGIOGRAPHY N/A 03/20/2019   Procedure: LEFT HEART CATH AND CORONARY ANGIOGRAPHY;  Surgeon: Lennette Bihari, MD;  Location: [facility] INVASIVE CV LAB;  Service: Cardiovascular;  Laterality: N/A;   LEFT HEART CATH AND CORONARY ANGIOGRAPHY N/A 05/20/2019   Procedure: LEFT HEART CATH AND CORONARY ANGIOGRAPHY;  Surgeon: Marykay Lex, MD;  Location: [facility] INVASIVE CV LAB;  Service: Cardiovascular;  Laterality: N/A;   LEFT HEART CATH AND CORONARY ANGIOGRAPHY N/A 06/10/2022   Procedure: LEFT HEART CATH AND CORONARY ANGIOGRAPHY;  Surgeon: Yvonne Kendall, MD;  Location: [facility] INVASIVE CV LAB;  Service: Cardiovascular;  Laterality: N/A;   RESECTION DISTAL CLAVICAL Right 01/23/2019   Procedure: OPEN DISTAL CLAVICLE EXCISION;  Surgeon: Vickki Hearing, MD;  Location: [facility] ORS;  Service: Orthopedics;   Laterality: Right;   TEE WITHOUT CARDIOVERSION N/A 05/03/2023   Procedure: TRANSESOPHAGEAL ECHOCARDIOGRAM (TEE);  Surgeon: Marjo Bicker, MD;  Location: [facility] OR[facility];  Service: Cardiovascular;  Laterality: N/A;   TOOTH EXTRACTION Bilateral 12/28/2017   Procedure: DENTAL RESTORATION/EXTRACTIONS;  Surgeon: Ocie Doyne, DDS;  Location: [facility] OR;  Service: Oral Surgery;  Laterality: Bilateral;   Family History  Problem Relation Age of Onset   Colon cancer Brother        in Michael [name] 12s   Heart failure Mother    Social History   Socioeconomic History   Marital status: Legally Separated    Spouse name: Not on file   Number of children: Not on file   Years of education: Not on file   Highest education level: Not on file  Occupational History   Not on file  Tobacco Use   Smoking status: Every Day    Current packs/day: 0.50    Average packs/day: 0.5 packs/day for 29.0 years (14.5 ttl pk-yrs)    Types: Cigarettes   Smokeless tobacco: Never   Tobacco comments:    Smokes 1/2 pack per day--09/20/20/ 3-4 cigs a day 06/01/22  Vaping Use   Vaping status: Never Used  Substance and Sexual Activity   Alcohol use: Not Currently   Drug use: No   Sexual activity: Yes  Other Topics Concern   Not on file  Social History Narrative   Are you right handed or left handed? left   Are you currently employed ?    What is your current occupation? disability   Do you live at home alone?yes   Who lives with you?    What type of home do you live in: 1 story or 2 story? one   Caffeine 1 soda a day    Social Drivers of Corporate investment banker Strain: Not on file  Food Insecurity: No Food Insecurity (06/10/2022)   Hunger Vital Sign    Worried About Running Out of Food in the Last Year: Never true    Ran Out of Food in the Last Year: Never true  Transportation Needs: No Transportation Needs (06/10/2022)   PRAPARE - Administrator, Civil Service (Medical): No    Lack of Transportation  (Non-Medical): No  Physical Activity: Not on file  Stress: Not on file  Social Connections: Not on file  Intimate Partner Violence: Not At Risk (06/10/2022)   Humiliation, Afraid, Rape, and Kick questionnaire    Fear of Current or Ex-Partner: No    Emotionally Abused: No    Physically Abused: No    Sexually Abused: No    SUBJECTIVE  Review of Systems Constitutional: Michael [name] denies any unintentional weight loss or change in strength lntegumentary: Michael [name] denies any rashes or pruritus Cardiovascular: Michael [name] denies chest pain or syncope Respiratory: Michael [name] denies shortness of breath Gastrointestinal: Reports persistent fecal incontinence 2x/ week for past 3 years Musculoskeletal: Michael [name] denies muscle cramps or weakness Neurologic: Michael [name] denies convulsions or seizures Allergic/Immunologic: Michael [name] denies recent allergic reaction(s) Hematologic/Lymphatic: Michael [name] denies bleeding tendencies Endocrine: Michael [name] denies heat/cold intolerance  GU: As per HPI.  OBJECTIVE Vitals:   06/26/23 0920  BP: (!) 142/87  Pulse: (!) 111  Temp: 98.8 F (37.1 C)   There is no height or weight on file to calculate BMI.  Physical Examination Constitutional: No obvious distress; Michael [name] is non-toxic appearing  Cardiovascular: No visible lower extremity edema.  Respiratory: The Michael [name] does not have audible wheezing/stridor; respirations do not appear labored  Gastrointestinal: Abdomen non-distended Musculoskeletal: Normal ROM of UEs  Skin: No obvious rashes/open sores  Neurologic: CN 2-12 grossly intact Psychiatric: Answered questions appropriately with normal affect  Hematologic/Lymphatic/Immunologic: No obvious bruises or sites of spontaneous bleeding  Urine microscopy: 3-10 RBC/hpf, otherwise unremarkable PVR: 3 ml  ASSESSMENT BPH with obstruction/lower urinary tract symptoms - Plan: BLADDER SCAN AMB NON-IMAGING, PR COMPLEX UROFLOMETRY, Urinalysis, Routine w reflex microscopic, CT  HEMATURIA WORKUP  Microscopic hematuria - Plan: CT HEMATURIA WORKUP  Smoker - Plan: CT HEMATURIA WORKUP  1. BPH with LUTS (frequency, nocturia, urgency, urge incontinence). Well managed with Flomax (Tamsulosin) 0.4 mg daily as prescribed by PCP; Michael [name] denies need for refills today.  2. For asymptomatic microscopic hematuria we discussed possible etiologies including but not limited to: vigorous exercise, sexual activity, stone, trauma, blood thinner use, urinary tract infection, urethral irritation secondary to chronic kidney disease, glomerulonephropathy, BPH, radiation cystitis, malignancy. We discussed pt's nicotine use as a risk factor for GU cancer and encouraged cessation.  We reviewed the AUA 2020 [place] guideline and risk stratification for this Michael [name]. Based on individual risk factors, pt was advised that the recommended workup includes CT hematuria protocol and cystoscopy. Michael [name] is very hesitant about cystoscopy but agreed to schedule.  Michael [name] verbalized understanding of and agreement with current plan. All questions were answered.  PLAN Advised the following: Continue Flomax (Tamsulosin) 0.4 mg daily. CT hematuria. Return for 1st available cystoscopy with any urology MD.  Orders Placed This Encounter  Procedures   CT HEMATURIA WORKUP    Standing Status:   Future    Expiration Date:   06/25/2024    Reason for Exam (SYMPTOM  OR DIAGNOSIS REQUIRED):   Microscopic hematuria    Preferred imaging location?:   [facility]   Urinalysis, Routine w reflex microscopic   PR COMPLEX UROFLOMETRY   BLADDER SCAN AMB NON-IMAGING    It has been explained that the Michael [name] is to follow regularly with their PCP in addition to all other providers involved in their care and to follow instructions provided by these respective offices. Michael [name] advised to contact urology clinic if any urologic-pertaining questions, concerns, new symptoms or problems arise in the interim period.  There are no  Michael [name] Instructions on file for this visit.  Electronically signed by:  Donnita Falls, MSN, FNP-C, CUNP 06/26/2023 10:09 AM

## 2023-06-26 ENCOUNTER — Encounter: Payer: Self-pay | Admitting: Urology

## 2023-06-26 ENCOUNTER — Ambulatory Visit (INDEPENDENT_AMBULATORY_CARE_PROVIDER_SITE_OTHER): Payer: 59 | Admitting: Urology

## 2023-06-26 VITALS — BP 142/87 | HR 111 | Temp 98.8°F

## 2023-06-26 DIAGNOSIS — F17200 Nicotine dependence, unspecified, uncomplicated: Secondary | ICD-10-CM | POA: Diagnosis not present

## 2023-06-26 DIAGNOSIS — R3129 Other microscopic hematuria: Secondary | ICD-10-CM | POA: Diagnosis not present

## 2023-06-26 DIAGNOSIS — N401 Enlarged prostate with lower urinary tract symptoms: Secondary | ICD-10-CM

## 2023-06-26 DIAGNOSIS — N138 Other obstructive and reflux uropathy: Secondary | ICD-10-CM | POA: Diagnosis not present

## 2023-06-26 DIAGNOSIS — R399 Unspecified symptoms and signs involving the genitourinary system: Secondary | ICD-10-CM

## 2023-06-26 LAB — MICROSCOPIC EXAMINATION
Bacteria, UA: NONE SEEN
WBC, UA: NONE SEEN /HPF (ref 0–5)

## 2023-06-26 LAB — URINALYSIS, ROUTINE W REFLEX MICROSCOPIC
Bilirubin, UA: NEGATIVE
Glucose, UA: NEGATIVE
Ketones, UA: NEGATIVE
Leukocytes,UA: NEGATIVE
Nitrite, UA: NEGATIVE
Protein,UA: NEGATIVE
Specific Gravity, UA: 1.03 (ref 1.005–1.030)
Urobilinogen, Ur: 0.2 mg/dL (ref 0.2–1.0)
pH, UA: 6 (ref 5.0–7.5)

## 2023-06-26 LAB — BLADDER SCAN AMB NON-IMAGING: Scan Result: 3

## 2023-06-26 NOTE — Progress Notes (Signed)
 Uroflowmetry order received. Patient voided own on with the following results:   Uroflow  Peak Flow: 928 ml Average Flow: 7 ml Voided Volume: 137 ml Voiding Time: 76 sec Flow Time: 19 sec Time to Peak Flow: 0 sec   Urine sent for Lab

## 2023-07-10 ENCOUNTER — Ambulatory Visit (HOSPITAL_COMMUNITY)
Admission: RE | Admit: 2023-07-10 | Discharge: 2023-07-10 | Disposition: A | Discharge status: Home / Self Care | Source: Ambulatory Visit | Attending: Urology | Admitting: Urology

## 2023-07-10 DIAGNOSIS — N401 Enlarged prostate with lower urinary tract symptoms: Secondary | ICD-10-CM | POA: Diagnosis present

## 2023-07-10 DIAGNOSIS — R3129 Other microscopic hematuria: Secondary | ICD-10-CM | POA: Diagnosis present

## 2023-07-10 DIAGNOSIS — N138 Other obstructive and reflux uropathy: Secondary | ICD-10-CM | POA: Insufficient documentation

## 2023-07-10 DIAGNOSIS — F17200 Nicotine dependence, unspecified, uncomplicated: Secondary | ICD-10-CM | POA: Diagnosis present

## 2023-07-10 LAB — POCT I-STAT CREATININE: Creatinine, Ser: 1.9 mg/dL — ABNORMAL HIGH (ref 0.61–1.24)

## 2023-07-10 MED ORDER — IOHEXOL 300 MG/ML  SOLN
100.0000 mL | Freq: Once | INTRAMUSCULAR | Status: AC | PRN
  Administered 2023-07-10: 100 mL via INTRAVENOUS

## 2023-07-12 ENCOUNTER — Other Ambulatory Visit: Payer: Self-pay | Admitting: Cardiology

## 2023-07-12 NOTE — Telephone Encounter (Signed)
 Refill request

## 2023-07-17 ENCOUNTER — Telehealth: Payer: Self-pay

## 2023-07-17 NOTE — Progress Notes (Signed)
 Unable to reach patient by phone. Letter mailed out

## 2023-07-17 NOTE — Telephone Encounter (Signed)
-----   Message from Donnita Falls sent at 07/16/2023  1:59 PM EDT ----- Please let pt know: CT hematuria showed no findings to account for hematuria; bladder evaluation limited due to streak artifact. Advised to follow up for cystoscopy with Dr. Retta Diones on 07/31/2023 as planned for further evaluation.

## 2023-07-17 NOTE — Telephone Encounter (Signed)
 Tried calling patient several time and consistency hanging up. Letter mailed out

## 2023-07-30 NOTE — Progress Notes (Signed)
 History of Present Illness: 63 yo male here for cysto.He has a h/o microscopic hematuria as well as LUTS.  4.5.2025: CT A/P hematuria protocol- Limited evaluation of the bladder due to streak artifact.  No CT findings to account for the patient's hematuria.  With him being on Flomax, his voiding is much better, he is having any real complaints.  IPSS 1/1.   Past Surgical History:  Procedure Laterality Date   BACK SURGERY  2012   2nd back surgery 3 monthes ago and in 2007   CARDIAC CATHETERIZATION  05/20/2019   COLONOSCOPY  11/05/2003   [id number] internal hemorrhoids, otherwise normal rectum and colon   CORONARY/GRAFT ACUTE MI REVASCULARIZATION N/A 03/20/2019   Procedure: Coronary/Graft Acute MI Revascularization;  Surgeon: Millicent Ally, MD;  Location: [facility] INVASIVE CV LAB;  Service: Cardiovascular;  Laterality: N/A;   CORONARY/GRAFT ACUTE MI REVASCULARIZATION N/A 06/10/2022   Procedure: Coronary/Graft Acute MI Revascularization;  Surgeon: Sammy Crisp, MD;  Location: [facility] INVASIVE CV LAB;  Service: Cardiovascular;  Laterality: N/A;   ESOPHAGOGASTRODUODENOSCOPY  03/22/2004   [id number] esophagogastroduodenoscopy. Small hiatal hernia otherwise normal stomach   HARVEST BONE GRAFT Left    thigh   HIP ARTHROPLASTY Bilateral    left 2017, right 2016   HIP SURGERY Left 2017   bone graft   LAMINECTOMY  2009   Balateral L4-L5   LEFT HEART CATH AND CORONARY ANGIOGRAPHY N/A 03/20/2019   Procedure: LEFT HEART CATH AND CORONARY ANGIOGRAPHY;  Surgeon: Millicent Ally, MD;  Location: [facility] INVASIVE CV LAB;  Service: Cardiovascular;  Laterality: N/A;   LEFT HEART CATH AND CORONARY ANGIOGRAPHY N/A 05/20/2019   Procedure: LEFT HEART CATH AND CORONARY ANGIOGRAPHY;  Surgeon: Arleen Lacer, MD;  Location: [facility] INVASIVE CV LAB;  Service: Cardiovascular;  Laterality: N/A;   LEFT HEART CATH AND CORONARY ANGIOGRAPHY N/A 06/10/2022   Procedure: LEFT HEART CATH AND CORONARY ANGIOGRAPHY;  Surgeon: Sammy Crisp, MD;  Location: [facility] INVASIVE CV LAB;  Service: Cardiovascular;  Laterality: N/A;   RESECTION DISTAL CLAVICAL Right 01/23/2019   Procedure: OPEN DISTAL CLAVICLE EXCISION;  Surgeon: Darrin Emerald, MD;  Location: [facility] OR[facility];  Service: Orthopedics;  Laterality: Right;   TEE WITHOUT CARDIOVERSION N/A 05/03/2023   Procedure: TRANSESOPHAGEAL ECHOCARDIOGRAM (TEE);  Surgeon: Mallipeddi, Vishnu P, MD;  Location: [facility] OR[facility];  Service: Cardiovascular;  Laterality: N/A;   TOOTH EXTRACTION Bilateral 12/28/2017   Procedure: DENTAL RESTORATION/EXTRACTIONS;  Surgeon: Ascencion Lava, DDS;  Location: [facility];  Service: Oral Surgery;  Laterality: Bilateral;    Home Medications:  Allergies as of 07/31/2023       Reactions   Brilinta  [ticagrelor ] Shortness Of Breath   Fentanyl  Rash   Other Reaction(s): Dizziness   Methadone Other (See Comments)   REACTION: "Become mentally unstable"        Medication List        Accurate as of July 30, 2023  7:34 PM. If you have any questions, ask your nurse or doctor.          albuterol  (2.5 MG/3ML) 0.083% nebulizer solution Commonly known as: PROVENTIL  Take 2.5 mg by nebulization every 6 (six) hours as needed.   albuterol  108 (90 Base) MCG/ACT inhaler Commonly known as: VENTOLIN  HFA Inhale 2 puffs into the lungs every 6 (six) hours as needed for wheezing or shortness of breath.   amLODipine  10 MG tablet Commonly known as: NORVASC  Take 1 tablet (10 mg total) by mouth daily.   ARIPiprazole  10 MG tablet Commonly known as: ABILIFY  Take  10 mg by mouth daily.   aspirin  EC 81 MG tablet Take 81 mg by mouth daily. Swallow whole.   budesonide -formoterol  160-4.5 MCG/ACT inhaler Commonly known as: Symbicort  Inhale 2 puffs into the lungs 2 (two) times daily.   clopidogrel  75 MG tablet Commonly known as: PLAVIX  take 4 tablets on day 1 and then 1 tablet daily starting on day 2.   cyanocobalamin  1000 MCG tablet Commonly known as: VITAMIN B12 Take 1,000  mcg by mouth daily.   cyclobenzaprine 10 MG tablet Commonly known as: FLEXERIL Take 10 mg by mouth at bedtime.   EVOLVE-MI evolocumab  140 mg/1 mL SQ injection Inject 1 mL (140 mg total) into the skin every 14 (fourteen) days. For Investigational Use Only. Inject subcutaneously into abdomen, thigh, or upper arm every 14 days. Rotate injection sites and do not inject into areas where skin is tender, bruised, or red. Please contact [facility] for any questions or concerns regarding this medication.   Repatha  SureClick 140 MG/ML Soaj Generic drug: Evolocumab  Inject 140 mg into the skin every 14 (fourteen) days. Inject subcutaneously into abdomen, thigh, or upper arm every 14 days. Rotate injection sites and do not inject into areas where skin is tender, bruised, or red. Please contact [facility] for any questions or concerns regarding this medication.   ezetimibe  10 MG tablet Commonly known as: Zetia  Take 1 tablet (10 mg total) by mouth daily.   fenofibrate  145 MG tablet Commonly known as: TRICOR  Take 1 tablet (145 mg total) by mouth daily.   FLUoxetine  20 MG capsule Commonly known as: PROZAC  Take 20 mg by mouth every morning.   furosemide  20 MG tablet Commonly known as: LASIX  take 1 tablet by mouth once daily as needed   Jardiance  10 MG Tabs tablet Generic drug: empagliflozin  Take 1 tablet (10 mg total) by mouth daily.   meclizine 25 MG tablet Commonly known as: ANTIVERT Take 25 mg by mouth 3 (three) times daily.   methocarbamol  750 MG tablet Commonly known as: ROBAXIN  Take 750 mg by mouth every 8 (eight) hours as needed for muscle spasms.   metoprolol  succinate 25 MG 24 hr tablet Commonly known as: TOPROL -XL Take 1/2 tablet (12.5 mg total) by mouth every morning.   Nebulizer Misc machine   nitroGLYCERIN  0.4 MG SL tablet Commonly known as: NITROSTAT  Place 1 tablet (0.4 mg total) under the tongue every 5 (five) minutes as needed for  chest pain.   Oxycodone  HCl 10 MG Tabs Take 10 mg by mouth every 6 (six) hours.   pregabalin  200 MG capsule Commonly known as: LYRICA  Take 200 mg by mouth in the morning, at noon, and at bedtime.   rosuvastatin  40 MG tablet Commonly known as: CRESTOR  Take 1 tablet (40 mg total) by mouth daily.   tamsulosin 0.4 MG Caps capsule Commonly known as: FLOMAX Take 0.4 mg by mouth daily.   zolpidem  10 MG tablet Commonly known as: AMBIEN  Take 10 mg by mouth at bedtime as needed for sleep.        Allergies:  Allergies  Allergen Reactions   Brilinta  [Ticagrelor ] Shortness Of Breath   Fentanyl  Rash    Other Reaction(s): Dizziness   Methadone Other (See Comments)    REACTION: "Become mentally unstable"    Family History  Problem Relation Age of Onset   Colon cancer Brother        in his 42s   Heart failure Mother     Social History:  reports that he  has been smoking cigarettes. He has a 14.5 pack-year smoking history. He has never used smokeless tobacco. He reports that he does not currently use alcohol. He reports that he does not use drugs.  ROS: A complete review of systems was performed.  All systems are negative except for pertinent findings as noted.  Physical Exam:  Vital signs in last 24 hours: There were no vitals taken for this visit. Constitutional:  Alert and oriented, No acute distress Cardiovascular: Regular rate  Respiratory: Normal respiratory effort Neurologic: Grossly intact, no focal deficits Psychiatric: Normal mood and affect  I have reviewed prior pt notes  I have reviewed notes from referring/previous physicians  I have reviewed urinalysis results  I have independently reviewed prior imaging--recent CT   Cystoscopy Procedure Note:  Indication: microhematuria  After informed consent and discussion of the procedure and its risks, Michael [name] was positioned and prepped in the standard fashion.  Cystoscopy was performed with a flexible  cystoscope.   Findings: Urethra: No stricture or lesion Prostate: Minimal obstruction from bilobar hypertrophy Bladder neck: Open Ureteral orifices: Normal in location and configuration bilaterally Bladder: No urothelial lesions.  Minimal trabeculations  The patient tolerated the procedure well.     Impression/Assessment:  -Microscopic hematuria in a smoker with negative cystoscopy and CT  -BPH with LUTS, better on Flomax  Plan:  -I will send voided urine for cytology today  -Stop smoking  -Unless cytology is positive, return as needed

## 2023-07-31 ENCOUNTER — Ambulatory Visit (INDEPENDENT_AMBULATORY_CARE_PROVIDER_SITE_OTHER): Admitting: Urology

## 2023-07-31 VITALS — BP 135/85 | HR 92

## 2023-07-31 DIAGNOSIS — N401 Enlarged prostate with lower urinary tract symptoms: Secondary | ICD-10-CM

## 2023-07-31 DIAGNOSIS — R3129 Other microscopic hematuria: Secondary | ICD-10-CM | POA: Diagnosis not present

## 2023-07-31 DIAGNOSIS — N138 Other obstructive and reflux uropathy: Secondary | ICD-10-CM

## 2023-07-31 LAB — URINALYSIS, ROUTINE W REFLEX MICROSCOPIC
Bilirubin, UA: NEGATIVE
Glucose, UA: NEGATIVE
Leukocytes,UA: NEGATIVE
Nitrite, UA: NEGATIVE
Specific Gravity, UA: 1.02 (ref 1.005–1.030)
Urobilinogen, Ur: 1 mg/dL (ref 0.2–1.0)
pH, UA: 6 (ref 5.0–7.5)

## 2023-07-31 LAB — MICROSCOPIC EXAMINATION: Bacteria, UA: NONE SEEN

## 2023-07-31 MED ORDER — CIPROFLOXACIN HCL 500 MG PO TABS
500.0000 mg | ORAL_TABLET | Freq: Once | ORAL | Status: AC
  Administered 2023-07-31: 500 mg via ORAL

## 2023-08-01 LAB — CYTOLOGY, URINE

## 2023-08-03 ENCOUNTER — Encounter: Payer: Self-pay | Admitting: *Deleted

## 2023-08-03 ENCOUNTER — Telehealth: Payer: Self-pay

## 2023-08-03 DIAGNOSIS — Z006 Encounter for examination for normal comparison and control in clinical research program: Secondary | ICD-10-CM

## 2023-08-03 NOTE — Telephone Encounter (Signed)
Tried calling patient with no answer, unable to leave vm.

## 2023-08-03 NOTE — Telephone Encounter (Signed)
-----   Message from Malcolm Scrivener Dahlstedt sent at 08/03/2023 10:17 AM EDT ----- Notify pt that urine cytology showed that no cancer cells were in urine. ----- Message ----- From: Interface, [facility] Lab Results In Sent: 07/31/2023   3:36 PM EDT To: Trent Frizzle, MD

## 2023-08-06 NOTE — Telephone Encounter (Signed)
 Tried calling patient several time and someone continuously hangs up. Letter mailed out making patient aware of MD response

## 2023-08-10 ENCOUNTER — Other Ambulatory Visit: Payer: Self-pay | Admitting: Nurse Practitioner

## 2023-08-13 ENCOUNTER — Encounter: Payer: Self-pay | Admitting: Nurse Practitioner

## 2023-08-13 ENCOUNTER — Ambulatory Visit: Attending: Nurse Practitioner | Admitting: Nurse Practitioner

## 2023-08-13 VITALS — BP 130/70 | HR 73 | Ht 74.0 in | Wt 236.0 lb

## 2023-08-13 DIAGNOSIS — E785 Hyperlipidemia, unspecified: Secondary | ICD-10-CM

## 2023-08-13 DIAGNOSIS — I10 Essential (primary) hypertension: Secondary | ICD-10-CM

## 2023-08-13 DIAGNOSIS — I6523 Occlusion and stenosis of bilateral carotid arteries: Secondary | ICD-10-CM | POA: Diagnosis not present

## 2023-08-13 DIAGNOSIS — R42 Dizziness and giddiness: Secondary | ICD-10-CM

## 2023-08-13 DIAGNOSIS — Z720 Tobacco use: Secondary | ICD-10-CM

## 2023-08-13 DIAGNOSIS — I2510 Atherosclerotic heart disease of native coronary artery without angina pectoris: Secondary | ICD-10-CM | POA: Diagnosis not present

## 2023-08-13 DIAGNOSIS — F439 Reaction to severe stress, unspecified: Secondary | ICD-10-CM

## 2023-08-13 DIAGNOSIS — Z87898 Personal history of other specified conditions: Secondary | ICD-10-CM

## 2023-08-13 DIAGNOSIS — I252 Old myocardial infarction: Secondary | ICD-10-CM

## 2023-08-13 NOTE — Progress Notes (Unsigned)
 Cardiology Office Note:  .   Date: 08/13/2023 ID:  Michael [name], DOB [birth year], MRN [id number] PCP: Aurelio Blower, [facility] Providers Cardiologist:  Armida Lander, MD    History of Present Illness: .   Michael [name] is a 63 y.o. male with a PMH of CAD, s/p STEMI, HLD, HTN, carotid artery stenosis, hx of sarcoid, tobacco use, dizziness, chronic pain, CKD, who presents today for scheduled follow-up. 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 Followed by Hem/Onc, has had hypercoagulable workup that has been reported to be negative.   Admission in 2020 with STEMI, cardiac cath revealed occluded dLAD and OM1, probable embolic occlusion, spontaneous reperfusion of LAD, low level PTCA of dLCX. Source of emboli was unclear, no A-fib noted at the time, no significant finding on Echo. Admission in 2021 with CP, trop peak 5300, cardiac cath did not show significant dx, cMRI showed EF 56%, inferolateral hypokinesis with delayed enhancement consistent with scar.   Admission 06/2022 with inferior STEMI. Cardiac cath revealed mPDA occlusion, unsuccessful attempt at PTCA. TTE EF 50%. Thought that event was possible recurrent cardio embolic event. Was referred to hematology for hypercoagulable workup given multiple events. Hem note 07/2022 says workup was negative.   Last seen by Dr. Armida Lander on January 15, 2023. Dr. Amanda Jungling consulted Dr. Pegge Bow who stated that because patient's hypercoagulable workup was negative, was okay to proceed with TEE/loop recorder. TEE was arranged, but was canceled as pt no showed.   Underwent TEE on May 03, 2023 and revealed EF 55%, no left atrial/left appendage thrombus detected, no significant valvular abnormalities, negative contrast bubble study, no evidence of interatrial shunt.   06/21/2023 - Today he presents for follow-up. Currently having bowel incontinence issues, not sure of the cause of this. Admits to  recent stress. Denies any chest pain, shortness of breath, palpitations, presyncope, orthopnea, PND, swelling or significant weight changes, acute bleeding, or claudication. Admits to a syncopal episode around 4 months ago, says he had a syncopal episode after speaking with some friends, etiology unclear. Does admit to daily dizziness, denies any particular orthostatic symptoms. Says dizziness affects his ability to drive, says he doesn't drive too far due to this.  08/13/2023 - Presents today for follow-up.  Patient is a difficult historian today due to the fact that he has a very flat affect when speaking with him, limited with his answers. Pt appears visibly upset. When asking patient to see if everything is okay, he tells me he doesn't want to talk about it. Then, opens up to tell me he is under stress. Admits to family issues. Denies any SI/HI. Also admits to some housing issues that is causing him stress. Denies any chest pain, palpitations, syncope, presyncope, orthopnea, PND, swelling or significant weight changes, acute bleeding, or claudication. Admits to worsening dizziness since I have last seen him. Also notes intermittent DOE for past couple of months, noticed with walking outside, stable over time.    ROS: Negative. See HPI.   Studies Reviewed: Aaron Aas    EKG: EKG is not ordered today.   TEE 04/2023:   1. Left ventricular ejection fraction, by estimation, is 55%. The left  ventricle has normal function. Left ventricular endocardial border not  optimally defined to evaluate regional wall motion. Left ventricular  diastolic function could not be evaluated.   2. Right ventricular systolic function is normal. The right ventricular  size is normal. Tricuspid regurgitation signal is inadequate  for assessing  PA pressure.   3. No left atrial/left atrial appendage thrombus was detected. Definity   contrast administered. The LAA emptying velocity was 72 cm/s.   4. The mitral valve is normal in  structure. Trivial mitral valve  regurgitation. No evidence of mitral stenosis.   5. The aortic valve is tricuspid. Aortic valve regurgitation is not  visualized. No aortic stenosis is present.   6. Agitated saline contrast bubble study was negative, with no evidence  of any interatrial shunt.  Carotid duplex 08/2022:  IMPRESSION: 1. Moderate-to-large amount of right-sided atherosclerotic plaque results in elevated peak systolic velocities within the right internal carotid artery compatible with the 50-69% luminal narrowing range. Further evaluation with CTA could be performed as indicated. 2. Moderate amount of left-sided atherosclerotic plaque, not resulting in a hemodynamically significant stenosis.  Echo 06/2022:  1. Left ventricular ejection fraction, by estimation, is 50%. The left  ventricle has low normal function. The left ventricle demonstrates  regional wall motion abnormalities (see scoring diagram/findings for  description). There is mild asymmetric left  ventricular hypertrophy of the septal segment. Left ventricular diastolic  parameters were grossly normal.   2. Right ventricular systolic function is normal. The right ventricular  size is normal. Tricuspid regurgitation signal is inadequate for assessing  PA pressure.   3. The mitral valve is normal in structure. Trivial mitral valve  regurgitation. No evidence of mitral stenosis.   4. The aortic valve is tricuspid. There is mild thickening of the aortic  valve. Aortic valve regurgitation is not visualized. No aortic stenosis is  present.   5. The inferior vena cava is normal in size with greater than 50%  respiratory variability, suggesting right atrial pressure of 3 mmHg.  LHC 06/2022:  Conclusions: Severe single vessel coronary artery disease with occlusion of mid rPDA.  Appearance is similar to distal LAD and LCx occlusions seen in 2020 and may reflect recurrent coronary embolism.  Mild RCA stenosis is similar to  prior catheterization in 2021. Mildly elevated left ventricular filling pressure (LVEDP 20 mmHg). Attempted PTCA to rPDA occlusion; flow could not be reestablished despite multiple inflations with a 2.0 x 12 mm balloon.   Recommendations: Tirofiban  infusion for 18 hours. Titrate nitroglycerin  infusion for relief of chest pain. Dual antiplatelet therapy with aspirin  and ticagrelor  for at least 12 months. Obtain echocardiogram. Consider hypercoagulable workup and TEE/ILR to assess for potential causes of recurrent coronary occlusions suspicious for emboli. Aggressive secondary prevention of coronary artery disease.   cMRI 05/2019:  IMPRESSION: 1. Normal left ventricular size with EF 56%, severe hypokinesis to akinesis of the mid inferolateral wall extending into a portion of the mid inferior wall.   2.  Normal RV size and systolic function, EF 51%.   3. Delayed enhancement pattern in the inferolateral wall matches the hypokinesis on cine images and looks like a coronary disease pattern (prior MI).   LHC 05/2019:  There is moderate to severe left ventricular systolic dysfunction with regional and global hypokinesis. Ejection Fraction (EF) appears to be 25-35% by visual estimate. LV end diastolic pressure is mildly elevated. The previously occluded 1st Mrg treated with PTCA is widely patent. Previously occluded Dist LAD lesionis now widely patent Ost RCA lesion is 20% stenosed.   SUMMARY Previously occluded distal circumflex is widely patent as is the LAD.  Mild ostial RCA but otherwise normal, tortuous vessel. Dilated Left Ventricle with global hypokinesis worse in the inferior and anterior apex.  EF estimated 30 of 35%. Suspect  NONISCHEMIC CARDIOMYOPATHY     RECOMMENDATIONS Recommend checking 2D echocardiogram for better assessment of EF and wall motion. Evaluate for causes of Nonischemic Cardiomyopathy with elevated troponin -> consider myocarditis. Further plans per primary  service -> would potentially consider cardiac MRI with concern for myocarditis.  Cardiac monitor 05/2019:  30 day event monitor Tracings primarily show sinus rhythm and sinus tachycardia. Short 4 beat run of atach. Short runs of NSVT up to 12 beats. No atrial fibrillation or atrial flutter noted  LHC 03/2019: Dist LAD lesion is 100% stenosed. 1st Mrg lesion is 100% stenosed. The left ventricular systolic function is normal. The left ventricular ejection fraction is 50-55% by visual estimate. Post intervention, there is a 0% residual stenosis.   Probable embolic occlusion of the distal OM branch of the circumflex as well as distal LAD.   Normal RCA with superior takeoff.   Low normal global LV function with EF estimated 50 to 55% and focal area of mid anterolateral and focal apical hypocontractility.   Low level PTCA of the distal circumflex with improvement of flow from TIMI 0 to TIMI I in a small caliber distal vessel.   Spontaneous reperfusion of the LAD which ultimately wraps around the LV apex.   RECOMMENDATION: Aggrastat  was started due to high suspect for embolic etiology rather than atherosclerotic plaque; continue for 18 hours post procedure.  DAPT with aspirin /Brilinta .  Smoking cessation is imperative.  We will plan for echo Doppler study in a.m.   Physical Exam:   VS:  BP 130/70   Pulse 73   Ht 6\' 2"  (1.88 m)   Wt 236 lb (107 kg)   SpO2 96%   BMI 30.30 kg/m    Wt Readings from Last 3 Encounters:  08/13/23 236 lb (107 kg)  06/21/23 230 lb (104.3 kg)  05/03/23 241 lb 10 oz (109.6 kg)    GEN: Well nourished, well developed in no acute distress NECK: No JVD; No carotid bruits CARDIAC: S1/S2, RRR, no murmurs, rubs, gallops RESPIRATORY:  Clear to auscultation without rales, wheezing or rhonchi  ABDOMEN: Soft, non-tender, non-distended EXTREMITIES:  No edema; No deformity   ASSESSMENT AND PLAN: .    CAD, hx of recurrent MI's, hx of STEMI Denies any chest pain.  His previous hx of recurrent MI's appeared to be embolic, unclear source. Dr. Armida Lander previously recommended that if patient's hypercoagulable workup was negative, would likely plan for TEE. Benign TEE report noted above. Also recommended loop recorder.  Did discuss EP referral with patient and reason for this and why it is important, however pt declines referral to EP at this time for loop recorder. Past orthostatics negative.  Continue current medication regimen. Heart healthy diet and regular cardiovascular exercise encouraged. Care and ED precautions discussed.   2. Carotid artery stenosis, HLD Denies any symptoms. Carotid duplex 08/2022 revealed moderate to large amount of plaque along right ICA compatible with 50-69% range. It was suggested that further evaluation with CTA could be performed as indicated, also noted to have moderate amount of left sided plaque, was not found to be hemodynamically significant. LDL 189 06/2023. Admits to compliance with medication. Will consult clinical pharm D to consider Leqvio for patient. Continue aspirin , plavix , Zetia , Repatha , and rosuvastatin . Plan to update study at next office visit.   3. HTN BP mildly elevated, admits to stress. Discussed to monitor BP at home at least 2 hours after medications and sitting for 5-10 minutes. Continue current medication regimen. Educated to notify our  office if SBP is consistently > 130. Heart healthy diet and regular cardiovascular exercise encouraged.   4. Hx of syncope, dizziness Denies any recent syncopal episodes. Last episode was around 4 months ago. Does admit to worsening dizziness. Orthostatics negative. Etiology unclear. Did offer referral to EP to consider loop recorder, pt declines this at this time. Conservative measures as well as care and ED precautions discussed. Discussed to avoid driving for at least 6 months. Continue to follow with PCP.  5. Tobacco abuse Smoking cessation encouraged and discussed.    6. Stress Pt attributes many of his symptoms d/t stress. Denies any SI/HI. Offered to speak with LCSW, Alfonza Angry, and he is agreeable to this.  I will have her give him a call to discuss more and offer resources/help.   Dispo: Follow-up with me/APP in 3 months or sooner if anything changes.   Signed, Lasalle Pointer, NP

## 2023-08-13 NOTE — Patient Instructions (Addendum)

## 2023-08-20 ENCOUNTER — Telehealth (HOSPITAL_COMMUNITY): Payer: Self-pay | Admitting: Licensed Clinical Social Worker

## 2023-08-20 ENCOUNTER — Telehealth: Payer: Self-pay | Admitting: Nurse Practitioner

## 2023-08-20 NOTE — Telephone Encounter (Signed)
-----   Message from Lasalle Pointer sent at 08/17/2023  8:50 AM EDT ----- Kandee Orion back from pharmacist.   Because he is in a clinical trial regarding Repatha , he is not a candidate for Leqvio and cannot take Leqvio and Repatha  together.  Please check to see if patient is taking Zetia  daily.  Pharmacist looked at fill history for patient, appears he is not taking this.  Please check with him about this.  Thanks!   Best, Lasalle Pointer, NP ----- Message ----- From: Benancio Bracket, RPH-CPP Sent: 08/17/2023   6:54 AM EDT To: Lasalle Pointer, NP  He is in a research trial [employer] MI where he is receiving Repatha . He would not be a candidate for Leqvio while in the trail. We do not use Repatha  and Leqvio together and Leqvio is generally less effective than Repatha .  From the fill hx it looks like he is not taking ezetimibe .  It does look like he is taking rosuvastatin  (or at least fills its monthly). Would assess for adherence. He does have a very high baseline >200, but would expect better results with Repatha  and rosuvastatin . Would make sure he is taking zetia  ----- Message ----- From: Lasalle Pointer, NP Sent: 08/16/2023  10:12 AM EDT To: [facility] Pharmd  Patient is on multiple medications to help lower cholesterol.  Most recent LDL is above goal.  Patient admits to compliance with medications.  Want to see if patient is a good candidate for Leqvio.  Wanted to get your thoughts.  Thanks!   Best, Lasalle Pointer, NP

## 2023-08-20 NOTE — Telephone Encounter (Signed)
 CSW received referral to contact patient to provide support due to stressors related to family and housing situation. CSW attempted to contact patient with no answer and unable to leave a message.  Aubry Blase, LCSW, CCSW-MCS [phone]

## 2023-08-20 NOTE — Telephone Encounter (Signed)
 Called patient phone rings once and hangs up no option to leave voicemail.

## 2023-08-21 NOTE — Telephone Encounter (Signed)
 Called patient phone rings once and hangs up no option to leave voicemail.

## 2023-08-23 ENCOUNTER — Encounter: Payer: Self-pay | Admitting: Nurse Practitioner

## 2023-08-23 NOTE — Telephone Encounter (Signed)
 Called patient phone rings once and hangs up with no option to leave voicemail. Will mail patient a letter to give our office a call regarding this.

## 2023-08-27 ENCOUNTER — Emergency Department (HOSPITAL_COMMUNITY)
Admission: EM | Admit: 2023-08-27 | Discharge: 2023-08-27 | Disposition: A | Discharge status: Home / Self Care | Attending: Emergency Medicine | Admitting: Emergency Medicine

## 2023-08-27 ENCOUNTER — Encounter (HOSPITAL_COMMUNITY): Payer: Self-pay | Admitting: Emergency Medicine

## 2023-08-27 ENCOUNTER — Other Ambulatory Visit: Payer: Self-pay

## 2023-08-27 DIAGNOSIS — I10 Essential (primary) hypertension: Secondary | ICD-10-CM | POA: Diagnosis not present

## 2023-08-27 DIAGNOSIS — E119 Type 2 diabetes mellitus without complications: Secondary | ICD-10-CM | POA: Insufficient documentation

## 2023-08-27 DIAGNOSIS — Z7982 Long term (current) use of aspirin: Secondary | ICD-10-CM | POA: Diagnosis not present

## 2023-08-27 DIAGNOSIS — Z79899 Other long term (current) drug therapy: Secondary | ICD-10-CM | POA: Insufficient documentation

## 2023-08-27 DIAGNOSIS — H66002 Acute suppurative otitis media without spontaneous rupture of ear drum, left ear: Secondary | ICD-10-CM | POA: Diagnosis not present

## 2023-08-27 DIAGNOSIS — Z720 Tobacco use: Secondary | ICD-10-CM | POA: Diagnosis not present

## 2023-08-27 DIAGNOSIS — Z7901 Long term (current) use of anticoagulants: Secondary | ICD-10-CM | POA: Diagnosis not present

## 2023-08-27 DIAGNOSIS — H9202 Otalgia, left ear: Secondary | ICD-10-CM | POA: Diagnosis present

## 2023-08-27 MED ORDER — AMOXICILLIN-POT CLAVULANATE 875-125 MG PO TABS: 1.0000 | ORAL_TABLET | Freq: Two times a day (BID) | ORAL | 0 refills | Status: AC

## 2023-08-27 NOTE — Discharge Instructions (Signed)
 Your testing and examination shows that you have an ear infection on the left.  Please take the medications exactly as prescribed and follow-up with your family doctor within 3 to 4 days if no better, ER for worsening symptoms  I would recommend that you also take a Zyrtec 10 mg tablet daily and use Flonase for nasal congestion or drainage  Please take Augmentin , 1 tablet twice a day for the next 7 days, this is used to treat infections, it treats a variety of infections including animal bites, bacterial infections, and some gastrointestinal infections.   It is related to amoxicillin  so do not take this medication if you are allergic to amoxicillin  or penicillin.  effects of medications such as antibiotics include diarrhea which may occur as well as potentially inactivating birth control so if you are using a birth control pill please use an alternative form of birth control for the next 2 weeks.  There is occasions where this antibiotic does not work so if you are not improving within 48 hours you will need to be reevaluated immediately by your doctor or in the emergency department if your symptoms are worsening

## 2023-08-27 NOTE — ED Provider Notes (Signed)
 [facility] EMERGENCY DEPARTMENT AT [facility] Provider Note   CSN: [id number] Arrival date & time: 08/27/23  2010     History  Chief Complaint  Patient presents with   Otalgia   Dizziness   Cough    Michael [name] is a 63 y.o. male.   Otalgia Associated symptoms: cough   Dizziness Cough Associated symptoms: ear pain    63 year old male with a history of prior myocardial infarction, hypertension, diabetes, heavy tobacco use presents with a complaint of left-sided ear pain, decreased hearing in the left ear, this is the primary reason for the visit but he states that he has also had some nasal congestion drainage and a bit of a cough ongoing for the last week or so.  He denies fevers chills nausea vomiting or diarrhea, he is not short of breath, he has had about 2 years of intermittent headaches and dizziness and states that that is not bothering him so much today and his family doctors have already looked into it.  There is no exertional symptoms, his left ear is gradually getting worse despite putting peroxide in his external auditory canal    Home Medications Prior to Admission medications   Medication Sig Start Date End Date Taking? Authorizing Provider  amoxicillin -clavulanate (AUGMENTIN ) 875-125 MG tablet Take 1 tablet by mouth every 12 (twelve) hours. 08/27/23  Yes Early Glisson, MD  albuterol  (PROVENTIL ) (2.5 MG/3ML) 0.083% nebulizer solution Take 2.5 mg by nebulization every 6 (six) hours as needed. 05/31/20   [provider]  albuterol  (VENTOLIN  HFA) 108 (90 Base) MCG/ACT inhaler Inhale 2 puffs into the lungs every 6 (six) hours as needed for wheezing or shortness of breath. 10/16/22   Sood, Vineet, MD  amLODipine  (NORVASC ) 10 MG tablet Take 1 tablet (10 mg total) by mouth daily. 01/15/23   Laurann Pollock, MD  ARIPiprazole  (ABILIFY ) 10 MG tablet Take 10 mg by mouth daily.    [provider]  aspirin  EC 81 MG tablet Take 81 mg by mouth daily.  Swallow whole.    [provider]  budesonide -formoterol  (SYMBICORT ) 160-4.5 MCG/ACT inhaler Inhale 2 puffs into the lungs 2 (two) times daily. 10/20/22   Sood, Vineet, MD  clopidogrel  (PLAVIX ) 75 MG tablet take 4 tablets on day 1 and then 1 tablet daily starting on day 2. 08/10/23   Laurann Pollock, MD  cyanocobalamin  (VITAMIN B12) 1000 MCG tablet Take 1,000 mcg by mouth daily.    [provider]  cyclobenzaprine (FLEXERIL) 10 MG tablet Take 10 mg by mouth at bedtime. 08/31/20   [provider]  Evolocumab  (REPATHA  SURECLICK) 140 MG/ML SOAJ Inject 140 mg into the skin every 14 (fourteen) days. Inject subcutaneously into abdomen, thigh, or upper arm every 14 days. Rotate injection sites and do not inject into areas where skin is tender, bruised, or red. Please contact [facility] for any questions or concerns regarding this medication. 01/01/23   Kristopher Pheasant, MD  ezetimibe  (ZETIA ) 10 MG tablet Take 1 tablet (10 mg total) by mouth daily. 06/13/22   Sanjuanita Cruz, NP  fenofibrate  (TRICOR ) 145 MG tablet Take 1 tablet (145 mg total) by mouth daily. 04/19/23   Lasalle Pointer, NP  FLUoxetine  (PROZAC ) 20 MG capsule Take 20 mg by mouth every morning. 12/19/21   [provider]  furosemide  (LASIX ) 20 MG tablet take 1 tablet by mouth once daily as needed 07/12/23   Ronald Cockayne, NP  JARDIANCE  10 MG TABS tablet Take  1 tablet (10 mg total) by mouth daily. 04/19/23   Lasalle Pointer, NP  meclizine (ANTIVERT) 25 MG tablet Take 25 mg by mouth 3 (three) times daily.    [provider]  methocarbamol  (ROBAXIN ) 750 MG tablet Take 750 mg by mouth every 8 (eight) hours as needed for muscle spasms. 05/09/22   [provider]  metoprolol  succinate (TOPROL -XL) 25 MG 24 hr tablet Take 1/2 tablet (12.5 mg total) by mouth every morning. 04/19/23   Lasalle Pointer, NP  Nebulizer MISC machine 05/31/20   [provider]  nitroGLYCERIN  (NITROSTAT ) 0.4 MG  SL tablet Place 1 tablet (0.4 mg total) under the tongue every 5 (five) minutes as needed for chest pain. 06/13/22   Sanjuanita Cruz, NP  Oxycodone  HCl 10 MG TABS Take 10 mg by mouth every 6 (six) hours. 08/14/22   [provider]  pregabalin  (LYRICA ) 200 MG capsule Take 200 mg by mouth in the morning, at noon, and at bedtime.    [provider]  rosuvastatin  (CRESTOR ) 40 MG tablet take 1 tablet (40 MILLIGRAM total) by mouth daily. 08/10/23   Laurann Pollock, MD  Study - EVOLVE-MI - evolocumab  (REPATHA ) 140 mg/mL SQ injection (PI-Stuckey) Inject 1 mL (140 mg total) into the skin every 14 (fourteen) days. For Investigational Use Only. Inject subcutaneously into abdomen, thigh, or upper arm every 14 days. Rotate injection sites and do not inject into areas where skin is tender, bruised, or red. Please contact [facility] for any questions or concerns regarding this medication. 06/20/22   Kristopher Pheasant, MD  tamsulosin (FLOMAX) 0.4 MG CAPS capsule Take 0.4 mg by mouth daily. 04/16/23   [provider]  zolpidem  (AMBIEN ) 10 MG tablet Take 10 mg by mouth at bedtime as needed for sleep. 07/10/22   [provider]      Allergies    Brilinta  [ticagrelor ], Fentanyl , and Methadone    Review of Systems   Review of Systems  HENT:  Positive for ear pain.   Respiratory:  Positive for cough.   Neurological:  Positive for dizziness.  All other systems reviewed and are negative.   Physical Exam Updated Vital Signs BP 99/75   Pulse (!) 101   Temp 98.7 F (37.1 C) (Oral)   Resp 16   Ht 1.88 m (6\' 2" )   Wt 97.5 kg   SpO2 99%   BMI 27.60 kg/m  Physical Exam Vitals and nursing note reviewed.  Constitutional:      General: He is not in acute distress.    Appearance: He is well-developed.  HENT:     Head: Normocephalic and atraumatic.     Right Ear: Tympanic membrane, ear canal and external ear normal. There is no impacted cerumen.     Left Ear: Ear  canal and external ear normal. There is no impacted cerumen.     Ears:     Comments: L TM is purulent and opacified - and bulging with erythema.  No ttp with palpation and manipulation of the auricle and tragus on the left.    Mouth/Throat:     Pharynx: No oropharyngeal exudate.  Eyes:     General: No scleral icterus.       Right eye: No discharge.        Left eye: No discharge.     Conjunctiva/sclera: Conjunctivae normal.     Pupils: Pupils are equal, round, and reactive to light.  Neck:     Thyroid : No thyromegaly.  Vascular: No JVD.  Cardiovascular:     Rate and Rhythm: Normal rate and regular rhythm.     Heart sounds: Normal heart sounds. No murmur heard.    No friction rub. No gallop.  Pulmonary:     Effort: Pulmonary effort is normal. No respiratory distress.     Breath sounds: Normal breath sounds. No wheezing or rales.  Abdominal:     General: Bowel sounds are normal. There is no distension.     Palpations: Abdomen is soft. There is no mass.     Tenderness: There is no abdominal tenderness.  Musculoskeletal:        General: No tenderness. Normal range of motion.     Cervical back: Normal range of motion and neck supple.     Right lower leg: No edema.     Left lower leg: No edema.  Lymphadenopathy:     Cervical: No cervical adenopathy.  Skin:    General: Skin is warm and dry.     Findings: No erythema or rash.  Neurological:     General: No focal deficit present.     Mental Status: He is alert.     Coordination: Coordination normal.  Psychiatric:        Behavior: Behavior normal.     ED Results / Procedures / Treatments   Labs (all labs ordered are listed, but only abnormal results are displayed) Labs Reviewed - No data to display  EKG None  Radiology No results found.  Procedures Procedures    Medications Ordered in ED Medications - No data to display  ED Course/ Medical Decision Making/ A&P                                 Medical Decision  Making  Lungs clear, URI exam with only a bulging TM - no other findings - either has viral or allergic URI sx - and a suppurative OM, pt agreeable to antibiotics and f/u - VS withotu fever - no neuro findings or c/o and no cardiac c/o.  The patient was counseled pensively on tobacco abuse and cessation        Final Clinical Impression(s) / ED Diagnoses Final diagnoses:  Non-recurrent acute suppurative otitis media of left ear without spontaneous rupture of tympanic membrane    Rx / DC Orders ED Discharge Orders          Ordered    amoxicillin -clavulanate (AUGMENTIN ) 875-125 MG tablet  Every 12 hours        08/27/23 2037              Early Glisson, MD 08/27/23 2038

## 2023-08-27 NOTE — ED Triage Notes (Signed)
 Pt with c/o L ear pain x 5 days as well as diminished hearing in L ear. Pt states he has been using Peroxide in it without relief. Pt also reports feeling dizzy or lightheaded x 1.34yrs. Pt states he has had a cough for approximately a week as well and has been using his inhaler.

## 2023-09-04 NOTE — Telephone Encounter (Signed)
 Advised patient of this and he states he is not taking zetia . He is only taking rosuvastatin .

## 2023-09-04 NOTE — Addendum Note (Signed)
 Addended by: Hortence Charter on: 09/04/2023 09:14 AM   Modules accepted: Orders

## 2023-09-06 ENCOUNTER — Other Ambulatory Visit: Payer: Self-pay

## 2023-09-06 ENCOUNTER — Encounter (HOSPITAL_COMMUNITY): Payer: Self-pay

## 2023-09-06 ENCOUNTER — Emergency Department (HOSPITAL_COMMUNITY)

## 2023-09-06 ENCOUNTER — Emergency Department (HOSPITAL_COMMUNITY)
Admission: EM | Admit: 2023-09-06 | Discharge: 2023-09-06 | Discharge status: Against Medical Advice | Attending: Emergency Medicine | Admitting: Emergency Medicine

## 2023-09-06 DIAGNOSIS — Z7902 Long term (current) use of antithrombotics/antiplatelets: Secondary | ICD-10-CM | POA: Diagnosis not present

## 2023-09-06 DIAGNOSIS — Z5329 Procedure and treatment not carried out because of patient's decision for other reasons: Secondary | ICD-10-CM | POA: Insufficient documentation

## 2023-09-06 DIAGNOSIS — I129 Hypertensive chronic kidney disease with stage 1 through stage 4 chronic kidney disease, or unspecified chronic kidney disease: Secondary | ICD-10-CM | POA: Diagnosis not present

## 2023-09-06 DIAGNOSIS — Z7982 Long term (current) use of aspirin: Secondary | ICD-10-CM | POA: Diagnosis not present

## 2023-09-06 DIAGNOSIS — R42 Dizziness and giddiness: Secondary | ICD-10-CM | POA: Diagnosis present

## 2023-09-06 DIAGNOSIS — N189 Chronic kidney disease, unspecified: Secondary | ICD-10-CM | POA: Diagnosis not present

## 2023-09-06 LAB — COMPREHENSIVE METABOLIC PANEL WITH GFR
ALT: 17 U/L (ref 0–44)
AST: 21 U/L (ref 15–41)
Albumin: 3.5 g/dL (ref 3.5–5.0)
Alkaline Phosphatase: 86 U/L (ref 38–126)
Anion gap: 9 (ref 5–15)
BUN: 18 mg/dL (ref 6–20)
CO2: 24 mmol/L (ref 22–32)
Calcium: 9.6 mg/dL (ref 8.9–10.3)
Chloride: 107 mmol/L (ref 98–111)
Creatinine, Ser: 2.2 mg/dL — ABNORMAL HIGH (ref 0.61–1.24)
GFR, Estimated: 33 mL/min — ABNORMAL LOW (ref >60)
Glucose, Bld: 92 mg/dL (ref 70–99)
Potassium: 4.7 mmol/L (ref 3.5–5.1)
Sodium: 140 mmol/L (ref 135–145)
Total Bilirubin: 0.4 mg/dL (ref 0.0–1.2)
Total Protein: 8.1 g/dL (ref 6.5–8.1)

## 2023-09-06 LAB — CBC WITH DIFFERENTIAL/PLATELET
Abs Immature Granulocytes: 0.06 10*3/uL (ref 0.00–0.07)
Basophils Absolute: 0.1 10*3/uL (ref 0.0–0.1)
Basophils Relative: 1 %
Eosinophils Absolute: 0.2 10*3/uL (ref 0.0–0.5)
Eosinophils Relative: 2 %
HCT: 41 % (ref 39.0–52.0)
Hemoglobin: 13.1 g/dL (ref 13.0–17.0)
Immature Granulocytes: 1 %
Lymphocytes Relative: 47 %
Lymphs Abs: 4.7 10*3/uL — ABNORMAL HIGH (ref 0.7–4.0)
MCH: 29.3 pg (ref 26.0–34.0)
MCHC: 32 g/dL (ref 30.0–36.0)
MCV: 91.7 fL (ref 80.0–100.0)
Monocytes Absolute: 0.7 10*3/uL (ref 0.1–1.0)
Monocytes Relative: 7 %
Neutro Abs: 4.1 10*3/uL (ref 1.7–7.7)
Neutrophils Relative %: 42 %
Platelets: 428 10*3/uL — ABNORMAL HIGH (ref 150–400)
RBC: 4.47 MIL/uL (ref 4.22–5.81)
RDW: 15.3 % (ref 11.5–15.5)
WBC: 9.8 10*3/uL (ref 4.0–10.5)
nRBC: 0 % (ref 0.0–0.2)

## 2023-09-06 NOTE — ED Triage Notes (Signed)
 Pt arrived via POV from home c/o on-going dizziness X 3 years. Pt reports he gets really dizzy when he stands up and starts moving. Pt seen here recently and Dx with otalgia and prescribed antibiotics. Pt reports he followed up with his PCP and was also prescribed ear drops. Pt endorses feeling lightheaded, denies recent falls.

## 2023-09-06 NOTE — ED Notes (Signed)
 Pt concerned his BP may be dropping when he changes positions.

## 2023-09-06 NOTE — ED Provider Notes (Signed)
 [place] EMERGENCY DEPARTMENT AT [facility] Provider Note   CSN: [id number] Arrival date & time: 09/06/23  1717     History  Chief Complaint  Patient presents with   Dizziness    Michael [name] is a 63 y.o. male.  63 year old male history of chronic dizziness, carotid artery stenosis, MI, CKD, hypertension and hyperlipidemia who presents to the emergency department with dizziness.  Patient reports that for 3 years he has been having intermittent dizzy spells.  Says that recently it is worsened.  Describes them as a lightheaded sensation and feels like he cannot get his feet beneath him.  It will also cause some spots in his vision.  Will last for approximately 20 seconds and then resolve.  Not triggered by anything in particular and can happen when he is seated or standing.  Does not have any palpitations when it occurs.  Says that he was felt like he was going to fall out today and decided to come into the emergency department.  Was recently treated for left ear infection.  Says he has had some hearing loss in that ear but no tinnitus.  No palpitations on this occurs.  Has had extensive workup including an MRI that did not show acute abnormalities.  Was instructed to follow-up with ENT but did not.       Home Medications Prior to Admission medications   Medication Sig Start Date End Date Taking? Authorizing Provider  albuterol  (PROVENTIL ) (2.5 MG/3ML) 0.083% nebulizer solution Take 2.5 mg by nebulization every 6 (six) hours as needed. 05/31/20   [provider]  albuterol  (VENTOLIN  HFA) 108 (90 Base) MCG/ACT inhaler Inhale 2 puffs into the lungs every 6 (six) hours as needed for wheezing or shortness of breath. 10/16/22   Sood, Vineet, MD  amLODipine  (NORVASC ) 10 MG tablet Take 1 tablet (10 mg total) by mouth daily. 01/15/23   Laurann Pollock, MD  amoxicillin -clavulanate (AUGMENTIN ) 875-125 MG tablet Take 1 tablet by mouth every 12 (twelve) hours. 08/27/23   Early Glisson, MD  ARIPiprazole  (ABILIFY ) 10 MG tablet Take 10 mg by mouth daily.    [provider]  aspirin  EC 81 MG tablet Take 81 mg by mouth daily. Swallow whole.    [provider]  budesonide -formoterol  (SYMBICORT ) 160-4.5 MCG/ACT inhaler Inhale 2 puffs into the lungs 2 (two) times daily. 10/20/22   Sood, Vineet, MD  clopidogrel  (PLAVIX ) 75 MG tablet take 4 tablets on day 1 and then 1 tablet daily starting on day 2. 08/10/23   Laurann Pollock, MD  cyanocobalamin  (VITAMIN B12) 1000 MCG tablet Take 1,000 mcg by mouth daily.    [provider]  cyclobenzaprine (FLEXERIL) 10 MG tablet Take 10 mg by mouth at bedtime. 08/31/20   [provider]  Evolocumab  (REPATHA  SURECLICK) 140 MG/ML SOAJ Inject 140 mg into the skin every 14 (fourteen) days. Inject subcutaneously into abdomen, thigh, or upper arm every 14 days. Rotate injection sites and do not inject into areas where skin is tender, bruised, or red. Please contact [facility] for any questions or concerns regarding this medication. 01/01/23   Kristopher Pheasant, MD  fenofibrate  (TRICOR ) 145 MG tablet Take 1 tablet (145 mg total) by mouth daily. 04/19/23   Lasalle Pointer, NP  FLUoxetine  (PROZAC ) 20 MG capsule Take 20 mg by mouth every morning. 12/19/21   [provider]  furosemide  (LASIX ) 20 MG tablet take 1 tablet by mouth once daily as needed 07/12/23  Ronald Cockayne, NP  JARDIANCE  10 MG TABS tablet Take 1 tablet (10 mg total) by mouth daily. 04/19/23   Lasalle Pointer, NP  meclizine (ANTIVERT) 25 MG tablet Take 25 mg by mouth 3 (three) times daily.    [provider]  methocarbamol  (ROBAXIN ) 750 MG tablet Take 750 mg by mouth every 8 (eight) hours as needed for muscle spasms. 05/09/22   [provider]  metoprolol  succinate (TOPROL -XL) 25 MG 24 hr tablet Take 1/2 tablet (12.5 mg total) by mouth every morning. 04/19/23   Lasalle Pointer, NP  Nebulizer MISC machine 05/31/20   [provider]  nitroGLYCERIN  (NITROSTAT ) 0.4 MG SL tablet Place 1 tablet (0.4 mg total) under the tongue every 5 (five) minutes as needed for chest pain. 06/13/22   Sanjuanita Cruz, NP  Oxycodone  HCl 10 MG TABS Take 10 mg by mouth every 6 (six) hours. 08/14/22   [provider]  pregabalin  (LYRICA ) 200 MG capsule Take 200 mg by mouth in the morning, at noon, and at bedtime.    [provider]  rosuvastatin  (CRESTOR ) 40 MG tablet take 1 tablet (40 MILLIGRAM total) by mouth daily. 08/10/23   Laurann Pollock, MD  Study - EVOLVE-MI - evolocumab  (REPATHA ) 140 mg/mL SQ injection (PI-Stuckey) Inject 1 mL (140 mg total) into the skin every 14 (fourteen) days. For Investigational Use Only. Inject subcutaneously into abdomen, thigh, or upper arm every 14 days. Rotate injection sites and do not inject into areas where skin is tender, bruised, or red. Please contact [facility] for any questions or concerns regarding this medication. 06/20/22   Kristopher Pheasant, MD  tamsulosin (FLOMAX) 0.4 MG CAPS capsule Take 0.4 mg by mouth daily. 04/16/23   [provider]  zolpidem  (AMBIEN ) 10 MG tablet Take 10 mg by mouth at bedtime as needed for sleep. 07/10/22   [provider]      Allergies    Brilinta  [ticagrelor ], Fentanyl , and Methadone    Review of Systems   Review of Systems  Physical Exam Updated Vital Signs BP 117/84 (BP Location: Right Arm)   Pulse 90   Temp 98.6 F (37 C) (Oral)   Resp 16   Ht 6\' 2"  (1.88 m)   Wt 97.5 kg   SpO2 98%   BMI 27.60 kg/m  Physical Exam Vitals and nursing note reviewed.  Constitutional:      General: He is not in acute distress.    Appearance: He is well-developed.  HENT:     Head: Normocephalic and atraumatic.     Right Ear: Tympanic membrane, ear canal and external ear normal.     Left Ear: Tympanic membrane, ear canal and external ear normal.     Nose: Nose normal.  Eyes:     Extraocular Movements: Extraocular  movements intact.     Conjunctiva/sclera: Conjunctivae normal.     Pupils: Pupils are equal, round, and reactive to light.  Cardiovascular:     Rate and Rhythm: Normal rate and regular rhythm.     Heart sounds: Normal heart sounds.  Pulmonary:     Effort: Pulmonary effort is normal. No respiratory distress.     Breath sounds: Normal breath sounds.  Musculoskeletal:     Cervical back: Normal range of motion and neck supple.     Right lower leg: No edema.     Left lower leg: No edema.  Skin:    General: Skin is warm and dry.  Neurological:  Mental Status: He is alert and oriented to person, place, and time. Mental status is at baseline.     Cranial Nerves: No cranial nerve deficit.     Sensory: No sensory deficit.     Motor: No weakness.     Coordination: Coordination normal.     Gait: Gait normal.  Psychiatric:        Mood and Affect: Mood normal.        Behavior: Behavior normal.     ED Results / Procedures / Treatments   Labs (all labs ordered are listed, but only abnormal results are displayed) Labs Reviewed  CBC WITH DIFFERENTIAL/PLATELET - Abnormal; Notable for the following components:      Result Value   Platelets 428 (*)    Lymphs Abs 4.7 (*)    All other components within normal limits  COMPREHENSIVE METABOLIC PANEL WITH GFR - Abnormal; Notable for the following components:   Creatinine, Ser 2.20 (*)    GFR, Estimated 33 (*)    All other components within normal limits    EKG EKG Interpretation Date/Time:  Thursday Sep 06 2023 20:44:55 EDT Ventricular Rate:  75 PR Interval:  200 QRS Duration:  90 QT Interval:  382 QTC Calculation: 426 R Axis:   21  Text Interpretation: Normal sinus rhythm Normal ECG When compared with ECG of 21-Jun-2023 14:02, T wave inversion no longer evident in Inferior leads Confirmed by Shyrl Doyne [phone]) on 09/06/2023 11:24:43 PM  Radiology No results found.  Procedures Procedures    Medications Ordered in  ED Medications - No data to display  ED Course/ Medical Decision Making/ A&P                                 Medical Decision Making Amount and/or Complexity of Data Reviewed Labs: ordered.   HASSEN BRUUN is a 63 y.o. male with comorbidities that complicate the patient evaluation including chronic dizziness, carotid artery stenosis, MI, CKD, hypertension and hyperlipidemia who presents to the emergency department with dizziness.   Initial Ddx:  Vertebrobasilar insufficiency, vestibular migraine, vestibular neuritis, lab otitis, arrhythmia, orthostasis  MDM/Course:  Patient presents emergency department with spontaneous episodic dizziness.  Has been ongoing for a long time.  Has had an MRI already.  Does have extensive vascular disease including carotid artery stenosis.  Does not have any palpitations or chest pain this happens making arrhythmia less likely.  I am concerned that he could have vertebrobasilar insufficiency or intermittent decreased cerebral blood flow causing his symptoms specially with his vascular disease.  Ordered a CTA for the patient but unfortunately he eloped prior to it being performed.  Feel that with the chronicity symptoms may be something like a vestibular migraine but would benefit from vessel imaging if he is able to have this arranged as an outpatient or comes back to the emergency department.  This patient presents to the ED for concern of complaints listed in HPI, this involves an extensive number of treatment options, and is a complaint that carries with it a high risk of complications and morbidity. Disposition including potential need for admission considered.   Dispo: eloped  Records reviewed ED Visit Notes The following labs were independently interpreted: Chemistry and show CKD I personally reviewed and interpreted the pt's EKG: see above for interpretation  I have reviewed the patients home medications and made adjustments as needed  Portions of  this note were generated with  Scientist, clinical (histocompatibility and immunogenetics). Dictation errors may occur despite best attempts at proofreading.     Final Clinical Impression(s) / ED Diagnoses Final diagnoses:  Dizziness    Rx / DC Orders ED Discharge Orders     None         Ninetta Basket, MD 09/06/23 2324

## 2023-10-08 ENCOUNTER — Other Ambulatory Visit: Payer: Self-pay

## 2023-10-08 ENCOUNTER — Other Ambulatory Visit (HOSPITAL_COMMUNITY): Payer: Self-pay

## 2023-10-08 NOTE — Progress Notes (Signed)
 Benefits Investigation Started  Rejection: DRUG NOT COVERED BY PLAN  Routed to: [place]

## 2023-10-08 NOTE — Progress Notes (Signed)
 Sent message to [facility] coordinator to look into this.

## 2023-10-09 ENCOUNTER — Other Ambulatory Visit: Payer: Self-pay

## 2023-10-09 ENCOUNTER — Telehealth: Payer: Self-pay | Admitting: Nurse Practitioner

## 2023-10-09 NOTE — Telephone Encounter (Signed)
 Called patient 2 times and it rang once with no option to leave voicemail

## 2023-10-09 NOTE — Telephone Encounter (Signed)
-----   Message from Almarie Crate sent at 10/06/2023 12:20 AM EDT ----- Please verify if he is taking Repatha . If so, would recommend follow-up with [facility].   Thanks!   Best, Almarie Crate, NP ----- Message ----- From: Myriam Fine Sent: 09/04/2023   9:14 AM EDT To: Almarie Crate, NP

## 2023-10-10 ENCOUNTER — Other Ambulatory Visit: Payer: Self-pay

## 2023-10-10 ENCOUNTER — Other Ambulatory Visit (HOSPITAL_COMMUNITY): Payer: Self-pay

## 2023-10-10 ENCOUNTER — Other Ambulatory Visit: Payer: Self-pay | Admitting: Cardiology

## 2023-10-11 ENCOUNTER — Other Ambulatory Visit: Payer: Self-pay

## 2023-10-12 ENCOUNTER — Other Ambulatory Visit (HOSPITAL_COMMUNITY): Payer: Self-pay

## 2023-10-15 ENCOUNTER — Other Ambulatory Visit: Payer: Self-pay

## 2023-10-17 ENCOUNTER — Other Ambulatory Visit: Payer: Self-pay

## 2023-10-17 ENCOUNTER — Other Ambulatory Visit (HOSPITAL_COMMUNITY): Payer: Self-pay

## 2023-10-25 ENCOUNTER — Other Ambulatory Visit (HOSPITAL_COMMUNITY): Payer: Self-pay

## 2023-10-30 NOTE — Research (Signed)
 Week 60Follow-Up Visit Completed*   []  Not Necessary, No Potential Adverse Events Or Medication Issues Reported On Completed Subject Questionnaire   []  Yes, Contact With Subject/Alternate Contact Completed   [x]  Yes, No Contact With Subject/Alternate Contact Completed, But Electronic Health Record Was Reviewed   []  No, Unable To Contact Subject/Alternate Contact   Have you reviewed Ongoing medications on the Targeted Concomitant Medication form and updated the form as needed?   [x]  Yes   []  No   Subject Status*   [x]  Continuing In Follow-up   []  At Risk For Lost To Follow-up   []  Withdrawal From All Future Study Activities Including Passive Follow-up By Electronic Health Record Review Or Contact With Healthcare Provider Or Family Member/Friend   []  Death   Vital Status*   [x]  Alive   []  Deceased   []  Unknown   Last Known To Be Alive Source*   []  Subject Completed Follow-up Questionnaire/Seen In Person/Via Telephone Contact   []  Family Member or Caretaker   [x]  Primary Physician Or Medical Records   []  Publicly Available Source   []  Other

## 2023-11-06 ENCOUNTER — Other Ambulatory Visit: Payer: Self-pay | Admitting: Cardiology

## 2023-11-06 ENCOUNTER — Other Ambulatory Visit: Payer: Self-pay | Admitting: Nurse Practitioner

## 2023-11-13 ENCOUNTER — Encounter: Payer: Self-pay | Admitting: Nurse Practitioner

## 2023-11-13 ENCOUNTER — Ambulatory Visit: Attending: Nurse Practitioner | Admitting: Nurse Practitioner

## 2023-11-20 ENCOUNTER — Telehealth: Payer: Self-pay | Admitting: Nurse Practitioner

## 2023-11-20 NOTE — Telephone Encounter (Signed)
 No option to leave voicemail

## 2023-11-20 NOTE — Telephone Encounter (Signed)
 Pt called in stating he had to r/s his appt that he missed 8/5. Next open slot is 9/29. He stated well since your are going to put me out that far I'm going to let you know that I had a heart attack on Sunday and I didn't go to the hospital. I know it was a heart attack because my heart was burning and I used the bathroom on myself.   Pt c/o of Chest Pain: STAT if active (IN THIS MOMENT) CP, including tightness, pressure, jaw pain, shoulder/upper arm/back pain, SOB, nausea, and vomiting.  1. Are you having CP right now (tightness, pressure, or discomfort)? No   2. Are you experiencing any other symptoms (ex. SOB, nausea, vomiting, sweating)? No   3. How long have you been experiencing CP? Happened Sunday   4. Is your CP continuous or coming and going? Coming and going   5. Have you taken Nitroglycerin ? no  6. If CP returns before callback, please consider calling 911. ?

## 2023-11-23 ENCOUNTER — Encounter: Payer: Self-pay | Admitting: *Deleted

## 2023-11-23 ENCOUNTER — Other Ambulatory Visit (HOSPITAL_COMMUNITY): Payer: Self-pay

## 2023-11-23 ENCOUNTER — Ambulatory Visit: Attending: Student | Admitting: Student

## 2023-11-23 ENCOUNTER — Encounter: Payer: Self-pay | Admitting: Student

## 2023-11-23 VITALS — BP 124/80 | HR 88 | Ht 74.0 in | Wt 233.6 lb

## 2023-11-23 DIAGNOSIS — I10 Essential (primary) hypertension: Secondary | ICD-10-CM

## 2023-11-23 DIAGNOSIS — I25118 Atherosclerotic heart disease of native coronary artery with other forms of angina pectoris: Secondary | ICD-10-CM | POA: Diagnosis not present

## 2023-11-23 DIAGNOSIS — R072 Precordial pain: Secondary | ICD-10-CM

## 2023-11-23 DIAGNOSIS — I6523 Occlusion and stenosis of bilateral carotid arteries: Secondary | ICD-10-CM

## 2023-11-23 DIAGNOSIS — N1832 Chronic kidney disease, stage 3b: Secondary | ICD-10-CM

## 2023-11-23 DIAGNOSIS — I5032 Chronic diastolic (congestive) heart failure: Secondary | ICD-10-CM | POA: Diagnosis not present

## 2023-11-23 DIAGNOSIS — E785 Hyperlipidemia, unspecified: Secondary | ICD-10-CM

## 2023-11-23 NOTE — Patient Instructions (Addendum)
 Medication Instructions:   Continue current medications.  *If you need a refill on your cardiac medications before your next appointment, please call your pharmacy*   Testing/Procedures:  Your physician has requested that you have a lexiscan  myoview . For further information please visit [email or web address] Please follow instruction sheet, as given.  Your physician has requested that you have a carotid duplex. This test is an ultrasound of the carotid arteries in your neck. It looks at blood flow through these arteries that supply the brain with blood. Allow one hour for this exam. There are no restrictions or special instructions.     Follow-Up: At [facility], you and your health needs are our priority.  As part of our continuing mission to provide you with exceptional heart care, our providers are all part of one team.  This team includes your primary Cardiologist (physician) and Advanced Practice Providers or APPs (Physician Assistants and Nurse Practitioners) who all work together to provide you with the care you need, when you need it.  Your next appointment:   1-2 months  Provider:   You may see Alvan Carrier, MD or one of the following Advanced Practice Providers on your designated Care Team:   Laymon Qua, PA-C  [place], [place] Olivia [place], [place]     We recommend signing up for the patient portal called MyChart.  Sign up information is provided on this After Visit Summary.  MyChart is used to connect with patients for Virtual Visits (Telemedicine).  Patients are able to view lab/test results, encounter notes, upcoming appointments, etc.  Non-urgent messages can be sent to your provider as well.   To learn more about what you can do with MyChart, go to [email or web address].

## 2023-11-23 NOTE — Progress Notes (Signed)
 Cardiology Office Note    Date:  11/23/2023  ID:  Michael [name], DOB 04-Dec-1960, MRN [id number] Cardiologist: Alvan Carrier, MD Cardiology APP:  Johnson Laymon HERO, PA-C { :  History of Present Illness:    Michael [name] is a 63 y.o. male with past medical history of CAD (s/p STEMI in 03/2019 with occluded distal LAD and OM1 and spontaneous reperfusion and no PCI, s/p Inferior STEMI in 06/2022 with mid-PDA occlusion and unsuccessful PTCA - prior hypercoagulable workup unrevealing), HFimpEF (EF 25-30% in 2021, at 50% by echo in 06/2022 and 55% by TEE in 04/2023), carotid artery stenosis, sarcoidosis (cMRI in 05/2019 showed no signs of cardiac involvement), HTN, HLD, Type 2 DM and tobacco use who presents to the office today for evaluation of chest pain.  He was last examined by Almarie Crate, NP in 08/2023 and her notes mention that he was a difficult historian with a flat affect. He denied any specific anginal symptoms though. He had previously been undergoing hypercoagulable workup which was negative per hematology and TEE was unrevealing as well. The option of loop recorder was reviewed and he declined at that time. No changes were made to his cardiac medications and he was continued on Amlodipine  10 mg daily, ASA 81 mg daily, Plavix  75 mg daily, Jardiance  10 mg daily, Repatha , Zetia  10 mg daily, Fenofibrate  145 mg daily, Toprol -XL 12.5 mg daily and Crestor  40 mg daily. A message was sent to the [facility] to check on Leqvio but he was in a research trial and receiving Repatha  at that time and was therefore not a candidate for Leqvio. Was encouraged to be compliant with Zetia  and Crestor  as well.  He called the office on 11/20/2023 reporting that he had a heart attack over the past weekend but did not go to the hospital. He had previously no-showed for his visit earlier in the month and was added to the wait list for a sooner appointment.  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 Says that it felt similar to his prior heart attack as he had pain in his chest and then defecated on himself which he says has occurred with prior MI's. He did not seek evaluation but instead went inside and rested and consumed fluids. Did not have SL NTG on hand but pain resolved in about one hour. Denies any similar symptoms since. Says his breathing has been stable and no recent palpitations, orthopnea, PND or pitting edema. In reviewing his medications, he says he has not taken Repatha  in over a year. Still smoking approximately 0.5 ppd and cessation advised.   Studies Reviewed:   EKG: EKG is ordered today and demonstrates:   EKG Interpretation Date/Time:  Friday November 23 2023 14:36:43 EDT Ventricular Rate:  86 PR Interval:  182 QRS Duration:  84 QT Interval:  368 QTC Calculation: 440 R Axis:   29  Text Interpretation: Normal sinus rhythm Inferior infarct pattern When compared with ECG of 06-Sep-2023 20:44, No significant change was found Confirmed by Johnson Laymon ([id number]) on 11/23/2023 4:38:54 PM       Cardiac Catheterization: 06/2022 Conclusions: Severe single vessel coronary artery disease with occlusion of mid rPDA.  Appearance is similar to distal LAD and LCx occlusions seen in 2020 and may reflect recurrent coronary embolism.  Mild RCA stenosis is similar to prior catheterization in 2021. Mildly elevated  left ventricular filling pressure (LVEDP 20 mmHg). Attempted PTCA to rPDA occlusion; flow could not be reestablished despite multiple inflations with a 2.0 x 12 mm balloon.   Recommendations: Tirofiban  infusion for 18 hours. Titrate nitroglycerin  infusion for relief of chest pain. Dual antiplatelet therapy with aspirin  and ticagrelor  for at least 12 months. Obtain echocardiogram. Consider hypercoagulable workup and TEE/ILR to assess for potential causes of recurrent coronary  occlusions suspicious for emboli. Aggressive secondary prevention of coronary artery disease.  Carotid Dopplers: 08/2022 IMPRESSION: 1. Moderate-to-large amount of right-sided atherosclerotic plaque results in elevated peak systolic velocities within the right internal carotid artery compatible with the 50-69% luminal narrowing range. Further evaluation with CTA could be performed as indicated. 2. Moderate amount of left-sided atherosclerotic plaque, not resulting in a hemodynamically significant stenosis.  TEE: 04/2023 IMPRESSIONS     1. Left ventricular ejection fraction, by estimation, is 55%. The left  ventricle has normal function. Left ventricular endocardial border not  optimally defined to evaluate regional wall motion. Left ventricular  diastolic function could not be evaluated.   2. Right ventricular systolic function is normal. The right ventricular  size is normal. Tricuspid regurgitation signal is inadequate for assessing  PA pressure.   3. No left atrial/left atrial appendage thrombus was detected. Definity   contrast administered. The LAA emptying velocity was 72 cm/s.   4. The mitral valve is normal in structure. Trivial mitral valve  regurgitation. No evidence of mitral stenosis.   5. The aortic valve is tricuspid. Aortic valve regurgitation is not  visualized. No aortic stenosis is present.   6. Agitated saline contrast bubble study was negative, with no evidence  of any interatrial shunt.    Physical Exam:   VS:  BP 124/80 (BP Location: Right Arm, Cuff Size: Normal)   Pulse 88   Ht 6' 2 (1.88 m)   Wt 233 lb 9.6 oz (106 kg)   SpO2 93%   BMI 29.99 kg/m    Wt Readings from Last 3 Encounters:  11/23/23 233 lb 9.6 oz (106 kg)  09/06/23 214 lb 15.2 oz (97.5 kg)  08/27/23 215 lb (97.5 kg)     GEN: Well nourished, well developed male appearing in no acute distress NECK: No JVD; No carotid bruits CARDIAC: RRR, no murmurs, rubs, gallops RESPIRATORY:  Clear  to auscultation without rales, wheezing or rhonchi  ABDOMEN: Appears non-distended. No obvious abdominal masses. EXTREMITIES: No clubbing or cyanosis. No pitting edema.  Distal pedal pulses are 2+ bilaterally.   Assessment and Plan:   1. CAD/Chest Pain with Mixed Features - He previously had a STEMI in 2020 with occluded distal LAD and OM1 with spontaneous reperfusion and an inferior STEMI in 06/2022 with mid PDA occlusion which may reflect recurrent coronary embolism. Previously underwent hypercoagulable workup which was negative thus far. Declined ILR placement at the time of his last visit.  - He reports pain this past weekend but refused ED evaluation at that time. Denies any recurrence. EKG today without acute ST changes. Given his advanced CKD, would try to avoid a repeat cardiac catheterization unless absolutely necessary. Given improvement in symptoms, will initially plan for a Lexiscan  Myoview  for ischemic evaluation. We reviewed warning signs which would warrant ED evaluation in the interim.  Discussed adding Imdur but he wants to avoid additional medications. Continue ASA 81 mg daily, Plavix  75 mg daily, Toprol -XL 12.5 mg daily and Crestor  40 mg daily. Will plan to restart Repatha  as outlined below.  2. HFimpEF - His EF  was previously 25 to 30% in 2021 and had normalized to 50% by TEE in 04/2023.   - He denies any recent respiratory issues and appears euvolemic by examination today. Continue Jardiance  10 mg daily, Lasix  20 mg as needed and Toprol -XL 12.5 mg daily.  3. Essential hypertension, benign - BP is well-controlled at 124/80 during today's visit. Continue current medical therapy with Amlodipine  10 mg daily and Toprol -XL 12.5 mg daily.  4. Hyperlipidemia LDL goal <55 - His LDL was significantly elevated at 159 when checked in 06/2023. He was previously part of the [employer] MI trial and was receiving Repatha  but says he has not received this in over a year. Will reach out to the  research team in regards to this. If no longer receiving, he is in agreement to resuming this and would send to his local pharmacy. Continue Fenofibrate  145 mg daily and Crestor  40 mg daily.  5. Bilateral carotid artery stenosis - Prior carotid dopplers in 08/2022 showed 50 to 69% RICA stenosis. Will plan for follow-up carotid dopplers. Would recommend resuming Repatha  as outlined above. Continue this along with ASA, Plavix  and statin therapy.   6. Stage 3a chronic kidney disease (HCC) - His creatinine was at 2.20 when checked in 08/2023 which is above his prior baseline of 1.7 - 1.9. We will request a copy of most recent labs from his PCP. Remains on Jardiance  10 mg daily.  Signed, Laymon CHRISTELLA Qua, PA-C

## 2023-11-27 ENCOUNTER — Other Ambulatory Visit: Payer: Self-pay

## 2023-11-27 ENCOUNTER — Other Ambulatory Visit (HOSPITAL_BASED_OUTPATIENT_CLINIC_OR_DEPARTMENT_OTHER): Payer: Self-pay

## 2023-11-27 ENCOUNTER — Encounter: Payer: Self-pay | Admitting: *Deleted

## 2023-11-27 DIAGNOSIS — Z006 Encounter for examination for normal comparison and control in clinical research program: Secondary | ICD-10-CM

## 2023-11-27 NOTE — Progress Notes (Signed)
 Specialty Pharmacy Refill Coordination Note  Michael [name] is a 63 y.o. male contacted today regarding refills of specialty medication(s) Evolocumab    Patient requested Delivery   Delivery date: 11/29/23   Verified address: [address]   Medication will be filled on 8/20.

## 2023-11-27 NOTE — Progress Notes (Signed)
 Must use [id number] for coupon

## 2023-11-28 ENCOUNTER — Ambulatory Visit (HOSPITAL_COMMUNITY): Attending: Student

## 2023-11-29 NOTE — Research (Signed)
 Follow-Up Visit Completed*   []  Not Necessary, No Potential Adverse Events Or Medication Issues Reported On Completed Subject Questionnaire   [x]  Yes, Contact With Subject/Alternate Contact Completed   []  Yes, No Contact With Subject/Alternate Contact Completed, But Electronic Health Record Was Reviewed   []  No, Unable To Contact Subject/Alternate Contact   Have you reviewed Ongoing medications on the Targeted Concomitant Medication form and updated the form as needed?   [x]  Yes   []  No   Subject Status*   [x]  Continuing In Follow-up   []  At Risk For Lost To Follow-up   []  Withdrawal From All Future Study Activities Including Passive Follow-up By Electronic Health Record Review Or Contact With Healthcare Provider Or Family Member/Friend   []  Death   Vital Status*   [x]  Alive   []  Deceased   []  Unknown   Last Known To Be Alive Source*   [x]  Subject Completed Follow-up Questionnaire/Seen In Person/Via Telephone Contact   []  Family Member or Caretaker   []  Primary Physician Or Medical Records   []  Publicly Available Source   []  Other  Date of last dose taken   09-Nov-2023 Over the last 12 weeks did the subject miss any doses? no  Over the last 12 weeks did the subject restart Evolocumab  after an interruption? no

## 2023-11-30 ENCOUNTER — Ambulatory Visit (HOSPITAL_COMMUNITY)
Admission: RE | Admit: 2023-11-30 | Discharge: 2023-11-30 | Disposition: A | Discharge status: Home / Self Care | Source: Ambulatory Visit | Attending: Student | Admitting: Student

## 2023-11-30 ENCOUNTER — Ambulatory Visit (HOSPITAL_COMMUNITY)
Admission: RE | Admit: 2023-11-30 | Discharge: 2023-11-30 | Disposition: A | Discharge status: Home / Self Care | Source: Ambulatory Visit | Attending: Cardiology | Admitting: Cardiology

## 2023-11-30 ENCOUNTER — Other Ambulatory Visit: Payer: Self-pay | Admitting: Physician Assistant

## 2023-11-30 ENCOUNTER — Ambulatory Visit: Payer: Self-pay | Admitting: Student

## 2023-11-30 ENCOUNTER — Encounter (HOSPITAL_COMMUNITY): Payer: Self-pay

## 2023-11-30 DIAGNOSIS — R072 Precordial pain: Secondary | ICD-10-CM | POA: Insufficient documentation

## 2023-11-30 DIAGNOSIS — I5032 Chronic diastolic (congestive) heart failure: Secondary | ICD-10-CM

## 2023-11-30 HISTORY — DX: Heart failure, unspecified: I50.9

## 2023-11-30 HISTORY — DX: Disorder of kidney and ureter, unspecified: N28.9

## 2023-11-30 LAB — NM MYOCAR MULTI W/SPECT W/WALL MOTION / EF
Exercise duration (min): 0 min
Exercise duration (sec): 0 s
LV dias vol: 156 mL (ref 62–150)
LV sys vol: 93 mL (ref 4.2–5.8)
MPHR: 160 {beats}/min
Nuc Stress EF: 40 %
Peak HR: 104 {beats}/min
Percent HR: 65 %
RATE: 0.4
Rest HR: 72 {beats}/min
Rest Nuclear Isotope Dose: 10.3 mCi
SDS: 5
SRS: 15
SSS: 20
ST Depression (mm): 0 mm
Stress Nuclear Isotope Dose: 32 mCi
TID: 1.23

## 2023-11-30 MED ORDER — TECHNETIUM TC 99M TETROFOSMIN IV KIT
10.0000 | PACK | Freq: Once | INTRAVENOUS | Status: AC | PRN
  Administered 2023-11-30: 10.3 via INTRAVENOUS

## 2023-11-30 MED ORDER — TECHNETIUM TC 99M TETROFOSMIN IV KIT
30.0000 | PACK | Freq: Once | INTRAVENOUS | Status: AC | PRN
  Administered 2023-11-30: 32 via INTRAVENOUS

## 2023-11-30 MED ORDER — REGADENOSON 0.4 MG/5ML IV SOLN
INTRAVENOUS | Status: AC
  Administered 2023-11-30: 0.4 mg via INTRAVENOUS
  Filled 2023-11-30: qty 5

## 2023-11-30 MED ORDER — SODIUM CHLORIDE FLUSH 0.9 % IV SOLN
INTRAVENOUS | Status: AC
  Administered 2023-11-30: 10 mL via INTRAVENOUS
  Filled 2023-11-30: qty 10

## 2023-11-30 NOTE — Progress Notes (Signed)
     Michael [name] presented for a Lexiscan  nuclear stress test today.  I Raphael LOISE Bring, PA-C, provided direct supervision and was present during the stress portion of the study today, which was completed without significant symptoms, immediate complications, or acute ST/T changes on ECG.  Stress imaging is pending at this time.  Preliminary ECG findings may be listed in the chart, but the stress test result will not be finalized until perfusion imaging is complete.  Jenita Rayfield N Debhora Titus, PA-C  11/30/2023, 10:41 AM

## 2023-12-05 ENCOUNTER — Encounter: Payer: Self-pay | Admitting: *Deleted

## 2023-12-11 ENCOUNTER — Other Ambulatory Visit: Payer: Self-pay | Admitting: Cardiology

## 2023-12-13 ENCOUNTER — Other Ambulatory Visit: Payer: Self-pay | Admitting: Nurse Practitioner

## 2023-12-14 ENCOUNTER — Telehealth: Payer: Self-pay | Admitting: Cardiology

## 2023-12-14 MED ORDER — ISOSORBIDE MONONITRATE ER 30 MG PO TB24: 30.0000 mg | ORAL_TABLET | Freq: Every day | ORAL | 3 refills | Status: AC

## 2023-12-14 NOTE — Telephone Encounter (Signed)
 The patient has been notified of the result and verbalized understanding.  All questions (if any) were answered. DOMINIC COLUMBUS, LPN 0/[id number]/7972 8:64 PM

## 2023-12-14 NOTE — Telephone Encounter (Signed)
 Pt called called in and would like to speak with nurse about test results from back in Aug in the hosp.    Best number [phone]

## 2024-01-07 ENCOUNTER — Ambulatory Visit: Attending: Student

## 2024-01-07 ENCOUNTER — Ambulatory Visit: Admitting: Nurse Practitioner

## 2024-01-07 DIAGNOSIS — I5032 Chronic diastolic (congestive) heart failure: Secondary | ICD-10-CM

## 2024-01-08 ENCOUNTER — Ambulatory Visit: Payer: Self-pay | Admitting: Student

## 2024-01-08 LAB — ECHOCARDIOGRAM LIMITED
AR max vel: 3.04 cm2
AV Peak grad: 5.2 mmHg
Ao pk vel: 1.14 m/s
Area-P 1/2: 2.82 cm2
Calc EF: 50.6 %
S' Lateral: 3.8 cm
Single Plane A2C EF: 50.9 %
Single Plane A4C EF: 51.3 %

## 2024-01-11 ENCOUNTER — Other Ambulatory Visit: Payer: Self-pay | Admitting: Nurse Practitioner

## 2024-01-16 ENCOUNTER — Encounter: Payer: Self-pay | Admitting: *Deleted

## 2024-01-25 ENCOUNTER — Telehealth: Payer: Self-pay | Admitting: Cardiology

## 2024-01-25 NOTE — Telephone Encounter (Signed)
Patient calling in about his results. Please advise 

## 2024-01-25 NOTE — Telephone Encounter (Signed)
 Called patient 2 times and it rings once and hangs up. No option to leave voicemail.

## 2024-01-28 NOTE — Telephone Encounter (Addendum)
 Letter was mailed to patient with test results for his echo & stress test by L. Pinnix, LPN   Multiple attempts were made to contact patient.  Patient has follow up already scheduled for this Friday, 02/01/24 at 10:40 in [place] office with Dr. Alvan.

## 2024-01-29 ENCOUNTER — Ambulatory Visit: Payer: Self-pay

## 2024-01-29 NOTE — Telephone Encounter (Signed)
 FYI Only or Action Required?: Action required by provider: request for appointment.  Patient is followed in Pulmonology for sarcoidosis, last seen on 10/20/2022 by Shellia Oh, MD.  Called Nurse Triage reporting Shortness of Breath and Care Management.  Symptoms began several weeks ago.  Interventions attempted: OTC medications: day/nyquil and Maintenance inhaler.  Symptoms are: unchanged.  Triage Disposition: Call Specialist Now  Patient/caregiver understands and will follow disposition?: Unsure          Copied from CRM [id number]. Topic: Clinical - Red Word Triage >> Jan 29, 2024 11:00 AM Devaughn RAMAN wrote: Red Word that prompted transfer to Nurse Triage: shortness of breath Reason for Disposition  Continuous (nonstop) coughing interferes with work, school, or sleeping    [facility] unable to find access/appropriate appointment via the [employer]. Pt was previous pt of Dr Shellia and has not had TOC appt yet. Forwarding to clinic to further assist.   Patient verbalized understanding and is expecting call back from office for appointment avilabilities. Of note, pt reports has PCP appt tomorrow. [facility] also advised that if pt does not hear back from office, to follow up with PCP tomorrow for further evaluation/treatment.  Answer Assessment - Initial Assessment Questions E2C2 Pulmonary Triage - Initial Assessment Questions Chief Complaint (e.g., cough, sob, wheezing, fever, chills, sweat or additional symptoms) *Go to specific symptom protocol after initial questions. SOB, bad productive green/brown coughing, R sided headache  How long have symptoms been present? 2-3 weeks ago  Have you tested for COVID or Flu? Note: If not, ask patient if a home test can be taken. If so, instruct patient to call back for positive results. No  MEDICINES:   Have you used any OTC meds to help with symptoms? Yes If yes, ask What medications? Nyquil/dayquil  Have you used your  inhalers/maintenance medication? Yes If yes, What medications? Pt unable to identify but claims he takes it as prescribed. Endorses taking a breathing machine twice a day without relief  If inhaler, ask How many puffs and how often? Note: Review instructions on medication in the chart. See above  OXYGEN: Do you wear supplemental oxygen? No If yes, How many liters are you supposed to use? N/a  Do you monitor your oxygen levels? Yes If yes, What is your reading (oxygen level) today? 95  What is your usual oxygen saturation reading?  (Note: Pulmonary O2 sats should be 90% or greater) 85 at baseline per patient           1. RESPIRATORY STATUS: Describe your breathing? (e.g., wheezing, shortness of breath, unable to speak, severe coughing)      See above 2. ONSET: When did this breathing problem begin?      See above 3. PATTERN Does the difficult breathing come and go, or has it been constant since it started?      Comes and goes 4. SEVERITY: How bad is your breathing? (e.g., mild, moderate, severe)      Mild with exertion. [facility]ager does not appreciate audible SOB/wheezing during call. Pt is speaking in full sentences.  5. RECURRENT SYMPTOM: Have you had difficulty breathing before? If Yes, ask: When was the last time? and What happened that time?      N/a 6. CARDIAC HISTORY: Do you have any history of heart disease? 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 LUNG HISTORY: Do you have any history of lung disease?  (e.g.,  pulmonary embolus, asthma, emphysema)     sarcoidosis 8. CAUSE: What do you think is causing the breathing problem?      flare 9. OTHER SYMPTOMS: Do you have any other symptoms? (e.g., chest pain, cough, dizziness, fever, runny nose)     CP, cough, dizziness at baseline - endorses has been ongoing x 2 years 10. O2 SATURATION MONITOR:  Do you use an oxygen  saturation monitor (pulse oximeter) at home? If Yes, ask: What is your reading (oxygen level) today? What is your usual oxygen saturation reading? (e.g., 95%)       N/a 11. PREGNANCY: Is there any chance you are pregnant? When was your last menstrual period?       N/a 12. TRAVEL: Have you traveled out of the country in the last month? (e.g., travel history, exposures)       N/a  Protocols used: Breathing Difficulty-A-AH

## 2024-01-30 ENCOUNTER — Encounter: Payer: Self-pay | Admitting: *Deleted

## 2024-01-30 DIAGNOSIS — Z006 Encounter for examination for normal comparison and control in clinical research program: Secondary | ICD-10-CM

## 2024-01-30 NOTE — Telephone Encounter (Signed)
 Called pt once and they answered and hung up, I called back right then and pt answered and hung up again.

## 2024-02-01 ENCOUNTER — Ambulatory Visit: Admitting: Cardiology

## 2024-02-01 NOTE — Progress Notes (Deleted)
 Clinical Summary Michael [name] is a 63 y.o.male  seen today for follow up of the following medical problems.    1. History of embolic STEMI - admitted 03/2019 with STEMI - cath showed occluded distal LAD and OM1, probable emoblic occlusion. Spontaneous reprfusion of LAD, low level PTCA of distal LCX - 03/2019 echo LVEF 60-65%, normal RV - unclear source of emboli. No significaint finding by echo. No afib noted during admission     05/2019 admitted again with chest pain. Trop up to 5300 -  cath 05/2019 showed no significant disease. LV gram 30-35% - 05/2019 cardiac MRI: LVEF 56%, inferolateral hypokinesis with delayed enhancement consistent with scar.     - 06/2022 admission with inferior STEMI - 06/2022 echo LVEF 50% - cath with mid PDA occlusion, unsuccesful attempt at PTCA. Treated with aggrastat . Throught possible recurrent cardioembolic event - referred to hematology for hypercoag workup given multiple events - from 07/2022 heme note hypercoag workup was negative, though few additional tests ordered. He did not f/u    Jan 2025 TEE: LVEF 55%, no thrombus, negative bubble study - he declined loop recorder -    - no recent chest pains - compliant with meds - 11/2023 nuclear stress: inferior infarct with mild peri-infarct ischemia.  12/2023 limited echo: LVEF 50-55%, grade I dd   2. Dizziness - typically ocurrs with standing - reports adequate hydration. Does take muscle relaxer bid. Oxycodone  only every few days.  - overall not that bothersome.        3. History of sarcoid - 05/2019 cardiac MRI without signs of cardiac involvement   4. Chronic pain - followed in pain clinic       5. Tobacco history - 25+ years history - has not had PFTs per his report   - PFTs ordered las visit but appears not completed.  - ongoing wheezing, SOB at times   - abnormal PFTs 08/2020 - . followed by Dr Shellia,     6. CKD - followed by pcp   7. Hyperlipidemia - part of reseach  study, started repatha  as part of research study. Remains on crestor        8. Carotid stenosis - 08/2022 US  with RICA 50-69%, LICA moderate plaque withotu significant stenosis Past Medical History:  Diagnosis Date   Allergic rhinitis    Arthritis    CHF (congestive heart failure) (HCC)    Chronic low back pain    follwed in Pain Clinic(Dr.Kirchmayer)   CKD (chronic kidney disease), stage II    Depression    Depression with anxiety    GERD (gastroesophageal reflux disease)    History of kidney stones    passed   HLD (hyperlipidemia) 03/23/2019   Hypertension    Insomnia    Migraines    NSTEMI (non-ST elevated myocardial infarction) (HCC) 11/2022   x2   Renal insufficiency    Sarcoidosis    Multisystem,pulmonary and hepatic (Dr.Rourk)Liver bx in 06 mildly postive AMA but on re-check normal   Sleep apnea    tested, but said it wasnt bad enough for a machine.     Allergies  Allergen Reactions   Brilinta  [Ticagrelor ] Shortness Of Breath   Fentanyl  Rash    Other Reaction(s): Dizziness   Methadone Other (See Comments)    REACTION: Become mentally unstable     Current Outpatient Medications  Medication Sig Dispense Refill   albuterol  (PROVENTIL ) (2.5 MG/3ML) 0.083% nebulizer solution Take 2.5 mg by nebulization every 6 (six)  hours as needed.     albuterol  (VENTOLIN  HFA) 108 (90 Base) MCG/ACT inhaler Inhale 2 puffs into the lungs every 6 (six) hours as needed for wheezing or shortness of breath. 8 g 5   amLODipine  (NORVASC ) 10 MG tablet Take 1 tablet (10 mg total) by mouth daily. 30 tablet 6   amoxicillin -clavulanate (AUGMENTIN ) 875-125 MG tablet Take 1 tablet by mouth every 12 (twelve) hours. (Patient not taking: Reported on 11/23/2023) 14 tablet 0   ARIPiprazole  (ABILIFY ) 10 MG tablet Take 10 mg by mouth daily.     aspirin  EC 81 MG tablet Take 81 mg by mouth daily. Swallow whole.     budesonide -formoterol  (SYMBICORT ) 160-4.5 MCG/ACT inhaler Inhale 2 puffs into the lungs  2 (two) times daily. 1 each 12   clopidogrel  (PLAVIX ) 75 MG tablet take 4 tablets on day 1 and then 1 tablet daily starting on day 2. 90 tablet 0   cyanocobalamin  (VITAMIN B12) 1000 MCG tablet Take 1,000 mcg by mouth daily.     cyclobenzaprine (FLEXERIL) 10 MG tablet Take 10 mg by mouth at bedtime.     empagliflozin  (JARDIANCE ) 10 MG TABS tablet Take 1 tablet by mouth once daily 90 tablet 3   Evolocumab  (REPATHA  SURECLICK) 140 MG/ML SOAJ Inject 140 mg into the skin every 14 (fourteen) days. Inject subcutaneously into abdomen, thigh, or upper arm every 14 days. Rotate injection sites and do not inject into areas where skin is tender, bruised, or red. Please contact [facility] for any questions or concerns regarding this medication. 12 mL 2   fenofibrate  (TRICOR ) 145 MG tablet Take 1 tablet (145 mg total) by mouth daily. 30 tablet 5   FLUoxetine  (PROZAC ) 20 MG capsule Take 20 mg by mouth every morning.     furosemide  (LASIX ) 20 MG tablet take 1 tablet by mouth once daily as needed 30 tablet 11   isosorbide  mononitrate (IMDUR ) 30 MG 24 hr tablet Take 1 tablet (30 mg total) by mouth daily. 90 tablet 3   meclizine (ANTIVERT) 25 MG tablet Take 25 mg by mouth 3 (three) times daily.     methocarbamol  (ROBAXIN ) 750 MG tablet Take 750 mg by mouth every 8 (eight) hours as needed for muscle spasms.     metoprolol  succinate (TOPROL -XL) 25 MG 24 hr tablet Take 1/2 tablet (12.5 mg total) by mouth every morning. 45 tablet 1   Nebulizer MISC machine     nitroGLYCERIN  (NITROSTAT ) 0.4 MG SL tablet Place 1 tablet (0.4 mg total) under the tongue every 5 (five) minutes as needed for chest pain. 25 tablet 1   Oxycodone  HCl 10 MG TABS Take 10 mg by mouth every 6 (six) hours.     pregabalin  (LYRICA ) 200 MG capsule Take 200 mg by mouth in the morning, at noon, and at bedtime.     rosuvastatin  (CRESTOR ) 40 MG tablet take 1 tablet (40 milligram total) by mouth daily. 90 tablet 0   Study - EVOLVE-MI -  evolocumab  (REPATHA ) 140 mg/mL SQ injection (PI-Stuckey) Inject 1 mL (140 mg total) into the skin every 14 (fourteen) days. For Investigational Use Only. Inject subcutaneously into abdomen, thigh, or upper arm every 14 days. Rotate injection sites and do not inject into areas where skin is tender, bruised, or red. Please contact [facility] for any questions or concerns regarding this medication. 12 mL 0   tamsulosin (FLOMAX) 0.4 MG CAPS capsule Take 0.4 mg by mouth daily.     zolpidem  (AMBIEN ) 10 MG  tablet Take 10 mg by mouth at bedtime as needed for sleep.     No current facility-administered medications for this visit.     Past Surgical History:  Procedure Laterality Date   BACK SURGERY  2012   2nd back surgery 3 monthes ago and in 2007   CARDIAC CATHETERIZATION  05/20/2019   COLONOSCOPY  11/05/2003   MFM:Fpwpfjo internal hemorrhoids, otherwise normal rectum and colon   CORONARY/GRAFT ACUTE MI REVASCULARIZATION N/A 03/20/2019   Procedure: Coronary/Graft Acute MI Revascularization;  Surgeon: Burnard Debby LABOR, MD;  Location: [facility] INVASIVE CV LAB;  Service: Cardiovascular;  Laterality: N/A;   CORONARY/GRAFT ACUTE MI REVASCULARIZATION N/A 06/10/2022   Procedure: Coronary/Graft Acute MI Revascularization;  Surgeon: Mady Bruckner, MD;  Location: [facility] INVASIVE CV LAB;  Service: Cardiovascular;  Laterality: N/A;   ESOPHAGOGASTRODUODENOSCOPY  03/22/2004   MFM:Wnmfjo esophagogastroduodenoscopy. Small hiatal hernia otherwise normal stomach   HARVEST BONE GRAFT Left    thigh   HIP ARTHROPLASTY Bilateral    left 2017, right 2016   HIP SURGERY Left 2017   bone graft   LAMINECTOMY  2009   Balateral L4-L5   LEFT HEART CATH AND CORONARY ANGIOGRAPHY N/A 03/20/2019   Procedure: LEFT HEART CATH AND CORONARY ANGIOGRAPHY;  Surgeon: Burnard Debby LABOR, MD;  Location: [facility] INVASIVE CV LAB;  Service: Cardiovascular;  Laterality: N/A;   LEFT HEART CATH AND CORONARY ANGIOGRAPHY N/A 05/20/2019    Procedure: LEFT HEART CATH AND CORONARY ANGIOGRAPHY;  Surgeon: Anner Alm ORN, MD;  Location: [facility] INVASIVE CV LAB;  Service: Cardiovascular;  Laterality: N/A;   LEFT HEART CATH AND CORONARY ANGIOGRAPHY N/A 06/10/2022   Procedure: LEFT HEART CATH AND CORONARY ANGIOGRAPHY;  Surgeon: Mady Bruckner, MD;  Location: [facility] INVASIVE CV LAB;  Service: Cardiovascular;  Laterality: N/A;   RESECTION DISTAL CLAVICAL Right 01/23/2019   Procedure: OPEN DISTAL CLAVICLE EXCISION;  Surgeon: Margrette Taft BRAVO, MD;  Location: [facility] ORS;  Service: Orthopedics;  Laterality: Right;   TEE WITHOUT CARDIOVERSION N/A 05/03/2023   Procedure: TRANSESOPHAGEAL ECHOCARDIOGRAM (TEE);  Surgeon: Mallipeddi, Vishnu P, MD;  Location: [facility] ORS;  Service: Cardiovascular;  Laterality: N/A;   TOOTH EXTRACTION Bilateral 12/28/2017   Procedure: DENTAL RESTORATION/EXTRACTIONS;  Surgeon: Sheryle Hamilton, DDS;  Location: [facility] OR;  Service: Oral Surgery;  Laterality: Bilateral;     Allergies  Allergen Reactions   Brilinta  [Ticagrelor ] Shortness Of Breath   Fentanyl  Rash    Other Reaction(s): Dizziness   Methadone Other (See Comments)    REACTION: Become mentally unstable      Family History  Problem Relation Age of Onset   Colon cancer Brother        in his 49s   Heart failure Mother      Social History Mr. Demirjian reports that he has been smoking cigarettes. He has a 14.5 pack-year smoking history. He has never used smokeless tobacco. Mr. Coudriet reports that he does not currently use alcohol.   Review of Systems CONSTITUTIONAL: No weight loss, fever, chills, weakness or fatigue.  HEENT: Eyes: No visual loss, blurred vision, double vision or yellow sclerae.No hearing loss, sneezing, congestion, runny nose or sore throat.  SKIN: No rash or itching.  CARDIOVASCULAR:  RESPIRATORY: No shortness of breath, cough or sputum.  GASTROINTESTINAL: No anorexia, nausea, vomiting or diarrhea. No abdominal pain or blood.  GENITOURINARY: No  burning on urination, no polyuria NEUROLOGICAL: No headache, dizziness, syncope, paralysis, ataxia, numbness or tingling in the extremities. No change in bowel or bladder control.  MUSCULOSKELETAL: No muscle, back  pain, joint pain or stiffness.  LYMPHATICS: No enlarged nodes. No history of splenectomy.  PSYCHIATRIC: No history of depression or anxiety.  ENDOCRINOLOGIC: No reports of sweating, cold or heat intolerance. No polyuria or polydipsia.  SABRA   Physical Examination There were no vitals filed for this visit. There were no vitals filed for this visit.  Gen: resting comfortably, no acute distress HEENT: no scleral icterus, pupils equal round and reactive, no palptable cervical adenopathy,  CV Resp: Clear to auscultation bilaterally GI: abdomen is soft, non-tender, non-distended, normal bowel sounds, no hepatosplenomegaly MSK: extremities are warm, no edema.  Skin: warm, no rash Neuro:  no focal deficits Psych: appropriate affect   Diagnostic Studies  03/2019 cath Dist LAD lesion is 100% stenosed. 1st Mrg lesion is 100% stenosed. The left ventricular systolic function is normal. The left ventricular ejection fraction is 50-55% by visual estimate. Post intervention, there is a 0% residual stenosis.   Probable embolic occlusion of the distal OM Michael [name] of the circumflex as well as distal LAD.   Normal RCA with superior takeoff.   Low normal global LV function with EF estimated 50 to 55% and focal area of mid anterolateral and focal apical hypocontractility.   Low level PTCA of the distal circumflex with improvement of flow from TIMI 0 to TIMI I in a small caliber distal vessel.   Spontaneous reperfusion of the LAD which ultimately wraps around the LV apex.   RECOMMENDATION: Aggrastat  was started due to high suspect for embolic etiology rather than atherosclerotic plaque; continue for 18 hours post procedure.  DAPT with aspirin /Brilinta .  Smoking cessation is imperative.  We  will plan for echo Doppler study in a.m.     03/2019 echo IMPRESSIONS      1. Left ventricular ejection fraction, by visual estimation, is 60 to 65%. The left ventricle has normal function. There is mildly increased left ventricular hypertrophy.  2. The left ventricle has no regional wall motion abnormalities.  3. Global right ventricle has normal systolic function.The right ventricular size is normal. No increase in right ventricular wall thickness.  4. Left atrial size was normal.  5. Right atrial size was normal.  6. The mitral valve is normal in structure. No evidence of mitral valve regurgitation.  7. The tricuspid valve is normal in structure. Tricuspid valve regurgitation is not demonstrated.  8. The aortic valve is grossly normal. Aortic valve regurgitation is not visualized. No evidence of aortic valve sclerosis or stenosis.  9. The pulmonic valve was normal in structure. Pulmonic valve regurgitation is not visualized. 10. Technically difficult echo with poor image quality. 11. The atrial septum is grossly normal.     05/2019 cath There is moderate to severe left ventricular systolic dysfunction with regional and global hypokinesis. Ejection Fraction (EF) appears to be 25-35% by visual estimate. LV end diastolic pressure is mildly elevated. The previously occluded 1st Mrg treated with PTCA is widely patent. Previously occluded Dist LAD lesionis now widely patent Ost RCA lesion is 20% stenosed.   SUMMARY Previously occluded distal circumflex is widely patent as is the LAD.  Mild ostial RCA but otherwise normal, tortuous vessel. Dilated Left Ventricle with global hypokinesis worse in the inferior and anterior apex.  EF estimated 30 of 35%. Suspect NONISCHEMIC CARDIOMYOPATHY     RECOMMENDATIONS Recommend checking 2D echocardiogram for better assessment of EF and wall motion. Evaluate for causes of Nonischemic Cardiomyopathy with elevated troponin -> consider  myocarditis. Further plans per primary service -> would potentially consider  cardiac MRI with concern for myocarditis.     08/2019 ABIs Summary:  Right: Resting right ankle-brachial index is within normal range. No  evidence of significant right lower extremity arterial disease. The right  toe-brachial index is normal.   Left: Resting left ankle-brachial index is within normal range. No  evidence of significant left lower extremity arterial disease. The left  toe-brachial index is normal.    06/2022 echo:  1. Left ventricular ejection fraction, by estimation, is 50%. The left  ventricle has low normal function. The left ventricle demonstrates  regional wall motion abnormalities (see scoring diagram/findings for  description). There is mild asymmetric left  ventricular hypertrophy of the septal segment. Left ventricular diastolic  parameters were grossly normal.   2. Right ventricular systolic function is normal. The right ventricular  size is normal. Tricuspid regurgitation signal is inadequate for assessing  PA pressure.   3. The mitral valve is normal in structure. Trivial mitral valve  regurgitation. No evidence of mitral stenosis.   4. The aortic valve is tricuspid. There is mild thickening of the aortic  valve. Aortic valve regurgitation is not visualized. No aortic stenosis is  present.   5. The inferior vena cava is normal in size with greater than 50%  respiratory variability, suggesting right atrial pressure of 3 mmHg.      06/2022 cath Conclusions: Severe single vessel coronary artery disease with occlusion of mid rPDA.  Appearance is similar to distal LAD and LCx occlusions seen in 2020 and may reflect recurrent coronary embolism.  Mild RCA stenosis is similar to prior catheterization in 2021. Mildly elevated left ventricular filling pressure (LVEDP 20 mmHg). Attempted PTCA to rPDA occlusion; flow could not be reestablished despite multiple inflations with a 2.0 x 12 mm  balloon.   Recommendations: Tirofiban  infusion for 18 hours. Titrate nitroglycerin  infusion for relief of chest pain. Dual antiplatelet therapy with aspirin  and ticagrelor  for at least 12 months. Obtain echocardiogram. Consider hypercoagulable workup and TEE/ILR to assess for potential causes of recurrent coronary occlusions suspicious for emboli. Aggressive secondary prevention of coronary artery disease.     Jan 2025 TEE 1. Left ventricular ejection fraction, by estimation, is 55%. The left  ventricle has normal function. Left ventricular endocardial border not  optimally defined to evaluate regional wall motion. Left ventricular  diastolic function could not be evaluated.   2. Right ventricular systolic function is normal. The right ventricular  size is normal. Tricuspid regurgitation signal is inadequate for assessing  PA pressure.   3. No left atrial/left atrial appendage thrombus was detected. Definity   contrast administered. The LAA emptying velocity was 72 cm/s.   4. The mitral valve is normal in structure. Trivial mitral valve  regurgitation. No evidence of mitral stenosis.   5. The aortic valve is tricuspid. Aortic valve regurgitation is not  visualized. No aortic stenosis is present.   6. Agitated saline contrast bubble study was negative, with no evidence  of any interatrial shunt.   Assessment and Plan  1. History of STEMI - recurrent MIs look to be embolic, unclear source - reach out to hematology to get final interpretation of his hyperocag workup - if negative hypercoag workup would likely plan for TEE, possibly loop recorder.  - continue DAPT with recent MI     2. HTN - above goal, increase norvasc  to 10mg  daily.    3. Hyperlipidemia - continue current meds, he is on high dose statin. Enrolled in research study has started repatha  as well  F/u 3 months. Would arrange TEE if hypercoag workup unremakrable, will message Dr Rogers.      Addendum From  Dr Noralee: Per my review of labs from 07/19/2022, I did not see anything of concern.  Factor VIII levels were high but it is an acute phase reactant.  Elevated beta-2  microglobulin IgA is not significant.  So far all his hypercoagulable workup was negative.  You may proceed with TEE/loop recorder.       Dorn PHEBE Ross, M.D., F.A.C.C.

## 2024-02-08 ENCOUNTER — Other Ambulatory Visit: Payer: Self-pay | Admitting: Cardiology

## 2024-02-18 NOTE — Research (Signed)
 Week 84Follow-Up Visit Completed*   []  Not Necessary, No Potential Adverse Events Or Medication Issues Reported On Completed Subject Questionnaire   []  Yes, Contact With Subject/Alternate Contact Completed   [x]  Yes, No Contact With Subject/Alternate Contact Completed, But Electronic Health Record Was Reviewed   []  No, Unable To Contact Subject/Alternate Contact   Have you reviewed Ongoing medications on the Targeted Concomitant Medication form and updated the form as needed?   [x]  Yes   []  No   Subject Status*   [x]  Continuing In Follow-up   []  At Risk For Lost To Follow-up   []  Withdrawal From All Future Study Activities Including Passive Follow-up By Electronic Health Record Review Or Contact With Healthcare Provider Or Family Member/Friend   []  Death   Vital Status*   [x]  Alive   []  Deceased   []  Unknown   Last Known To Be Alive Source*   []  Subject Completed Follow-up Questionnaire/Seen In Person/Via Telephone Contact   []  Family Member or Caretaker   [x]  Primary Physician Or Medical Records   []  Publicly Available Source   []  Other

## 2024-02-21 NOTE — Progress Notes (Signed)
 Virtual Visit Via Video   Consent was obtained for video visit:  Yes.   Answered questions that patient had about telehealth interaction:  Yes.   I discussed the limitations, risks, security and privacy concerns of performing an evaluation and management service by telemedicine. I also discussed with the patient that there may be a patient responsible charge related to this service. The patient expressed understanding and agreed to proceed.  Pt location: Home Physician Location: office Name of referring provider:  Teresa Jenkins Jansky, FNP I connected with Michael [name] at patients initiation/request on 02/29/2024 at  2:00 PM EST by video enabled telemedicine application and verified that I am speaking with the correct person using two identifiers. Pt MRN:  [id number] Pt DOB:  Mar 04, 1961 Video Participants:  Michael [name];  Venetia Potters, MD  HPI: Michael [name] is a 63 y.o. year old male with a history of pre-diabetes, CAD s/p MI x2, HLD, HTN, lumbosacral spine disease s/p 2 back surgeries, sarcoidosis, anxiety, depression, chronic pain who we last saw on 04/18/23 for burning pain, particularly in his left arm.  To briefly review: 06/01/22: Patient has muscles spasms, diffuse numbness, burning/needle pains in feet and hands (mostly in left hand). This has been going on in the feet for about 10 years. His hand symptoms have been present for the last few years. He gets cramps throughout the body. Pickle juice helps. He takes flexeril for muscle spasms. He takes Lyrica  for 200 mg TID. He sees pain management, but patient has not gotten good relief to date. Patient is not sure what is behind his pain. Patient feels like his symptoms started after his first back surgery. He originally had back surgery due to back pain, but never feels like things improved after.   Patient feels like symptoms are similar on both sides, but maybe worse on the right.   He has significant back and neck pain.   Patient was  seen by [facility] previously. The notes mention cervical radiculopathy and/or carpal tunnel syndrome, but not a lot of information. He has had right shoulder surgery, both hips have had surgery, and 2 back surgeries (L4-L5 per patient).   Patient is on fluoxetine .   He report any constitutional symptoms like fever, night sweats, anorexia or unintentional weight loss.   EtOH use: None  Restrictive diet? No Family history of neuropathy/myopathy/neurologic disease? None known   12/21/22: Lab work was significant for borderline B12 and HbA1c of 6.3 (known pre-DM). I recommended B12 1000 mcg daily. Patient takes the B12 as needed.    EMG on 06/26/22 showed the residuals of an old L5 radiculopathy (mild in degree electrically), but no evidence of a large fiber neuropathy or right cervical radiculopathy. I wanted patient to get an MRI of cervical and lumbar spine, but we were unable to reach patient by phone. A letter was sent to patient. Patient states he did not get any of these. He does have neck pain.   Patient continues to have constant pain in the left arm. He sees pain management who gives him oxycodone  10 mg QID which does not even touch the pain. This is his primary concern and not interested in talking about his other symptoms.  04/18/23: Patient continues to have significant pain in the left arm. He can use it, but it hurts. It is about the same as last visit. Patient did not get his MRI cervical spine. He said he was never called about this.  He continues to see pain management and get oxycodone  and Lyrica  200 mg TID. This sometimes helps for a short period of time.  Most recent Assessment and Plan (04/18/23): [name], a 63 y.o. male with chronic pain, particularly in left arm. The pain is the entire arm distal to the level of the deltoid muscle. There is no obvious weakness or abnormal reflexes, but subjective sensory loss. EMG completed in 06/2022 looking for peripheral  neuropathy given different concerns at appointment on 06/01/22 showed only the residuals of an old right L5 radiculopathy. LUE was not extensively evaluated in that EMG. He is not in favor of further EMG. He has significant neck pain, so cervical radiculopathy on the left is possible. He did not get MRI cervical spine as recommended after last visit, but is in agreement to get it done.   Plan: -MRI cervical spine wo contrast -Physical therapy for neck and back pain -Fall precautions discussed -Chronic pain management per pain management team -Continue Lyrica  200 mg TID  RTC to be determined  Since their last visit: This visit was originally to be in person but was switched to virtual as patient called this morning with car trouble.  Patient is still having a lot pain in the LUE. He still has not done the MRI cervical spine. He states he gets a lot of spam calls so he doesn't answer the call. Patient wants to get the MRI at [facility]. He states he will go by and talk to them and see about getting an appointment in person.  PT does seem like it helped some per notes. Patient continues to see pain management.   MEDICATIONS:  Outpatient Encounter Medications as of 02/29/2024  Medication Sig   albuterol  (PROVENTIL ) (2.5 MG/3ML) 0.083% nebulizer solution Take 2.5 mg by nebulization every 6 (six) hours as needed.   albuterol  (VENTOLIN  HFA) 108 (90 Base) MCG/ACT inhaler Inhale 2 puffs into the lungs every 6 (six) hours as needed for wheezing or shortness of breath.   amLODipine  (NORVASC ) 10 MG tablet Take 1 tablet (10 mg total) by mouth daily.   ARIPiprazole  (ABILIFY ) 10 MG tablet Take 10 mg by mouth daily.   aspirin  EC 81 MG tablet Take 81 mg by mouth daily. Swallow whole.   budesonide -formoterol  (SYMBICORT ) 160-4.5 MCG/ACT inhaler Inhale 2 puffs into the lungs 2 (two) times daily.   clopidogrel  (PLAVIX ) 75 MG tablet take 4 tablets on day 1 and then 1 tablet daily starting on day 2. (Patient  taking differently: 40 mg. take 4 tablets on day 1 and then 1 tablet daily starting on day 2.)   cyanocobalamin  (VITAMIN B12) 1000 MCG tablet Take 1,000 mcg by mouth daily.   cyclobenzaprine (FLEXERIL) 10 MG tablet Take 10 mg by mouth at bedtime.   empagliflozin  (JARDIANCE ) 10 MG TABS tablet Take 1 tablet by mouth once daily   Evolocumab  (REPATHA  SURECLICK) 140 MG/ML SOAJ Inject 140 mg into the skin every 14 (fourteen) days. Inject subcutaneously into abdomen, thigh, or upper arm every 14 days. Rotate injection sites and do not inject into areas where skin is tender, bruised, or red. Please contact [facility] for any questions or concerns regarding this medication.   fenofibrate  (TRICOR ) 145 MG tablet Take 1 tablet (145 mg total) by mouth daily.   FLUoxetine  (PROZAC ) 20 MG capsule Take 20 mg by mouth every morning.   furosemide  (LASIX ) 20 MG tablet take 1 tablet by mouth once daily as needed  isosorbide  mononitrate (IMDUR ) 30 MG 24 hr tablet Take 1 tablet (30 mg total) by mouth daily.   meclizine (ANTIVERT) 25 MG tablet Take 25 mg by mouth 3 (three) times daily.   methocarbamol  (ROBAXIN ) 750 MG tablet Take 750 mg by mouth every 8 (eight) hours as needed for muscle spasms.   metoprolol  succinate (TOPROL -XL) 25 MG 24 hr tablet Take 1/2 tablet (12.5 mg total) by mouth every morning.   Nebulizer MISC machine   nitroGLYCERIN  (NITROSTAT ) 0.4 MG SL tablet Place 1 tablet (0.4 mg total) under the tongue every 5 (five) minutes as needed for chest pain.   Oxycodone  HCl 10 MG TABS Take 10 mg by mouth every 6 (six) hours. (Patient taking differently: Take 10 mg by mouth every 6 (six) hours. 5 pills a day)   pregabalin  (LYRICA ) 200 MG capsule Take 200 mg by mouth in the morning, at noon, and at bedtime.   rosuvastatin  (CRESTOR ) 40 MG tablet Take 1 tablet (40 mg total) by mouth daily.   Study - EVOLVE-MI - evolocumab  (REPATHA ) 140 mg/mL SQ injection (PI-Stuckey) Inject 1 mL (140 mg total) into  the skin every 14 (fourteen) days. For Investigational Use Only. Inject subcutaneously into abdomen, thigh, or upper arm every 14 days. Rotate injection sites and do not inject into areas where skin is tender, bruised, or red. Please contact [facility] for any questions or concerns regarding this medication.   tamsulosin (FLOMAX) 0.4 MG CAPS capsule Take 0.4 mg by mouth daily.   zolpidem  (AMBIEN ) 10 MG tablet Take 10 mg by mouth at bedtime as needed for sleep.   amoxicillin -clavulanate (AUGMENTIN ) 875-125 MG tablet Take 1 tablet by mouth every 12 (twelve) hours. (Patient not taking: Reported on 02/29/2024)   No facility-administered encounter medications on file as of 02/29/2024.    PAST MEDICAL HISTORY: Past Medical History:  Diagnosis Date   Allergic rhinitis    Arthritis    CHF (congestive heart failure) (HCC)    Chronic low back pain    follwed in [facility](Dr.Kirchmayer)   CKD (chronic kidney disease), stage II    Depression    Depression with anxiety    GERD (gastroesophageal reflux disease)    History of kidney stones    passed   HLD (hyperlipidemia) 03/23/2019   Hypertension    Insomnia    Migraines    NSTEMI (non-ST elevated myocardial infarction) (HCC) 11/2022   x2   Renal insufficiency    Sarcoidosis    Multisystem,pulmonary and hepatic (Dr.Rourk)Liver bx in 06 mildly postive AMA but on re-check normal   Sleep apnea    tested, but said it wasnt bad enough for a machine.    PAST SURGICAL HISTORY: Past Surgical History:  Procedure Laterality Date   BACK SURGERY  2012   2nd back surgery 3 monthes ago and in 2007   CARDIAC CATHETERIZATION  05/20/2019   COLONOSCOPY  11/05/2003   MFM:Fpwpfjo internal hemorrhoids, otherwise normal rectum and colon   CORONARY/GRAFT ACUTE MI REVASCULARIZATION N/A 03/20/2019   Procedure: Coronary/Graft Acute MI Revascularization;  Surgeon: Burnard Debby LABOR, MD;  Location: [facility] INVASIVE CV LAB;  Service: Cardiovascular;   Laterality: N/A;   CORONARY/GRAFT ACUTE MI REVASCULARIZATION N/A 06/10/2022   Procedure: Coronary/Graft Acute MI Revascularization;  Surgeon: Mady Bruckner, MD;  Location: [facility] INVASIVE CV LAB;  Service: Cardiovascular;  Laterality: N/A;   ESOPHAGOGASTRODUODENOSCOPY  03/22/2004   MFM:Wnmfjo esophagogastroduodenoscopy. Small hiatal hernia otherwise normal stomach   HARVEST BONE GRAFT Left    thigh  HIP ARTHROPLASTY Bilateral    left 2017, right 2016   HIP SURGERY Left 2017   bone graft   LAMINECTOMY  2009   Balateral L4-L5   LEFT HEART CATH AND CORONARY ANGIOGRAPHY N/A 03/20/2019   Procedure: LEFT HEART CATH AND CORONARY ANGIOGRAPHY;  Surgeon: Burnard Debby LABOR, MD;  Location: [facility] INVASIVE CV LAB;  Service: Cardiovascular;  Laterality: N/A;   LEFT HEART CATH AND CORONARY ANGIOGRAPHY N/A 05/20/2019   Procedure: LEFT HEART CATH AND CORONARY ANGIOGRAPHY;  Surgeon: Anner Alm ORN, MD;  Location: [facility] INVASIVE CV LAB;  Service: Cardiovascular;  Laterality: N/A;   LEFT HEART CATH AND CORONARY ANGIOGRAPHY N/A 06/10/2022   Procedure: LEFT HEART CATH AND CORONARY ANGIOGRAPHY;  Surgeon: Mady Bruckner, MD;  Location: [facility] INVASIVE CV LAB;  Service: Cardiovascular;  Laterality: N/A;   RESECTION DISTAL CLAVICAL Right 01/23/2019   Procedure: OPEN DISTAL CLAVICLE EXCISION;  Surgeon: Margrette Taft BRAVO, MD;  Location: [facility] ORS;  Service: Orthopedics;  Laterality: Right;   TEE WITHOUT CARDIOVERSION N/A 05/03/2023   Procedure: TRANSESOPHAGEAL ECHOCARDIOGRAM (TEE);  Surgeon: Mallipeddi, Vishnu P, MD;  Location: [facility] ORS;  Service: Cardiovascular;  Laterality: N/A;   TOOTH EXTRACTION Bilateral 12/28/2017   Procedure: DENTAL RESTORATION/EXTRACTIONS;  Surgeon: Sheryle Hamilton, DDS;  Location[facility] OR;  Service: Oral Surgery;  Laterality: Bilateral;    ALLERGIES: Allergies  Allergen Reactions   Brilinta  [Ticagrelor ] Shortness Of Breath   Fentanyl  Rash    Other Reaction(s): Dizziness   Methadone Other (See Comments)     REACTION: Become mentally unstable    FAMILY HISTORY: Family History  Problem Relation Age of Onset   Colon cancer Brother        in his 27s   Heart failure Mother     SOCIAL HISTORY: Social History   Tobacco Use   Smoking status: Every Day    Current packs/day: 0.50    Average packs/day: 0.5 packs/day for 29.0 years (14.5 ttl pk-yrs)    Types: Cigarettes   Smokeless tobacco: Never   Tobacco comments:    Smokes 1/2 pack per day--09/20/20/ 3-4 cigs a day 06/01/22  5 cig a day 02/29/24  Vaping Use   Vaping status: Never Used  Substance Use Topics   Alcohol use: Not Currently    Comment: Occ   Drug use: No   Social History   Social History Narrative   Are you right handed or left handed? left   Are you currently employed ?    What is your current occupation? disability   Do you live at home alone?yes   Who lives with you?    What type of home do you live in: 1 story or 2 story? one   Caffeine 1 soda a day     Objective:  GEN:  The patient appears stated age and is in NAD.  Neurological examination:  Orientation: The patient is alert and oriented x3. Cranial nerves: There is good facial symmetry. There is no facial hypomimia.  The speech is fluent and clear. Hearing is intact to conversational tone. Motor: Strength is at least antigravity x 4.    Sensation: Intact to patient's own light touch.   Lab and Test Review: New results: 09/06/23: CMP significant for Cr 2.20 CBC w/ diff unremarkable  External lab (05/30/23): Lipid panel: tChol 229, LDL 159, TG 149  Previously reviewed results: 06/01/22: HbA1c: 6.3 B12: 365 B1 wnl IFE: No M protein   External labs: 04/12/22: CBC unremarkable BMP significant for Cr 1.64  TSH (09/15/20): 0.676 A1c (05/05/20): 5.6   Imaging: CT head and cervical spine wo contrast (10/17/16): FINDINGS: CT HEAD FINDINGS   Brain: No evidence of acute infarction, hemorrhage, hydrocephalus, extra-axial collection or mass lesion/mass  effect.   Vascular: No hyperdense vessel or unexpected calcification.   Skull: No osseous abnormality.   Sinuses/Orbits: Visualized paranasal sinuses are clear. Visualized mastoid sinuses are clear. Visualized orbits demonstrate no focal abnormality.   Other: None   CT CERVICAL SPINE FINDINGS   Alignment: Normal.   Skull base and vertebrae: No acute fracture. No primary bone lesion or focal pathologic process.   Soft tissues and spinal canal: No prevertebral fluid or swelling. No visible canal hematoma.   Disc levels: Disc spaces are maintained. Broad central disc protrusion at C5-6.   Upper chest: Lung apices are clear.   Other: No fluid collection or hematoma.   IMPRESSION: 1. No acute intracranial pathology. 2. No acute osseous injury of the cervical spine. 3. Broad central disc protrusion at C5-6.   EMG (06/26/22): NCV & EMG Findings: Extensive electrodiagnostic evaluation of the right upper and lower limbs with additional nerve conduction studies of the left upper limb shows: Right sural, superficial peroneal, median, ulnar, and radial sensory responses are within normal limits. Bilateral ulnar (ADM) motor responses show reduced amplitude (L5.9, R6.1 mV). Right peroneal/fibular (EDB), tibial (AH), and median (APB) are within normal limits. Chronic motor axon loss changes without accompanying active denervation changes are seen in the right tibialis anterior and gluteus medius muscles. All other tested muscles are within normal limits with normal motor unit configuration and recruitment patterns.   Impression: This is an abnormal study. The findings are most consistent with the following: The residuals of an old intraspinal canal lesion (ie: motor radiculopathy) at the right L5 root, mild in degree electrically. No electrodiagnostic evidence of a large fiber sensorimotor polyneuropathy. No electrodiagnostic evidence of a right cervical (C5-C8) motor  radiculopathy. Reduced bilateral ulnar (ADM) motor responses are of unclear clinical significance given normal needle examination of ulnar innervated muscles and may be technical in nature. The findings are too limited in degree and distribution for definitive diagnosis.  ASSESSMENT: [name], a 63 y.o. male with chronic pain, particularly in left arm. The pain is the entire arm distal to the level of the deltoid muscle. There is no obvious weakness or abnormal reflexes on previous in person exams, but subjective sensory loss. EMG completed in 06/2022 looking for peripheral neuropathy given different concerns at appointment on 06/01/22 showed only the residuals of an old right L5 radiculopathy. LUE was not extensively evaluated in that EMG. He is not in favor of further EMG. He has significant neck pain, so cervical radiculopathy on the left is possible. He did not get MRI cervical spine over the last 2 visits now, as recommended, but is in agreement to get it done. For some reason, he does not want to answer the phone when scheduling calls or call the hospital (despite answering our call today to set up virtual visit). He states in will walk into [facility] and get it taken care of, despite that I warned this may not work.  Plan: -Will reorder MRI cervical spine wo contrast one more time.  Return to clinic to be determined after patient gets MRI cervical spine.   Follow up Instructions      -I discussed the assessment and treatment plan with the patient. The patient was provided an opportunity to ask questions and  all were answered. The patient agreed with the plan and demonstrated an understanding of the instructions.   The patient was advised to call back or seek an in-person evaluation if the symptoms worsen or if the condition fails to improve as anticipated.    Total time spent on today's visit was 25 minutes, including both face-to-face time and nonface-to-face time.  Time  included that spent on review of records (prior notes available to me/labs/imaging if pertinent), discussing treatment and goals, answering patient's questions and coordinating care.   Venetia LITTIE Potters, MD

## 2024-02-29 ENCOUNTER — Telehealth: Admitting: Neurology

## 2024-02-29 ENCOUNTER — Encounter: Payer: Self-pay | Admitting: Neurology

## 2024-02-29 VITALS — Ht 74.0 in | Wt 225.0 lb

## 2024-02-29 DIAGNOSIS — M79602 Pain in left arm: Secondary | ICD-10-CM | POA: Diagnosis not present

## 2024-02-29 DIAGNOSIS — G894 Chronic pain syndrome: Secondary | ICD-10-CM | POA: Diagnosis not present

## 2024-02-29 DIAGNOSIS — M5417 Radiculopathy, lumbosacral region: Secondary | ICD-10-CM | POA: Diagnosis not present

## 2024-02-29 DIAGNOSIS — M542 Cervicalgia: Secondary | ICD-10-CM | POA: Diagnosis not present

## 2024-02-29 NOTE — Addendum Note (Signed)
 Addended by: LEIGH VENETIA CROME on: 02/29/2024 04:37 PM   Modules accepted: Level of Service

## 2024-03-03 ENCOUNTER — Telehealth: Payer: Self-pay

## 2024-03-03 NOTE — Telephone Encounter (Signed)
 Approval for MRI # [id number]

## 2024-03-11 ENCOUNTER — Ambulatory Visit (HOSPITAL_COMMUNITY)
Admission: RE | Admit: 2024-03-11 | Discharge: 2024-03-11 | Disposition: A | Source: Ambulatory Visit | Attending: Neurology

## 2024-03-11 DIAGNOSIS — M79602 Pain in left arm: Secondary | ICD-10-CM | POA: Diagnosis present

## 2024-03-11 DIAGNOSIS — M5417 Radiculopathy, lumbosacral region: Secondary | ICD-10-CM | POA: Diagnosis present

## 2024-03-11 DIAGNOSIS — G894 Chronic pain syndrome: Secondary | ICD-10-CM | POA: Diagnosis present

## 2024-03-17 ENCOUNTER — Ambulatory Visit: Payer: Self-pay | Admitting: Neurology

## 2024-03-17 ENCOUNTER — Other Ambulatory Visit: Payer: Self-pay

## 2024-03-17 DIAGNOSIS — M4802 Spinal stenosis, cervical region: Secondary | ICD-10-CM

## 2024-03-17 DIAGNOSIS — M5412 Radiculopathy, cervical region: Secondary | ICD-10-CM

## 2024-04-01 ENCOUNTER — Encounter: Payer: Self-pay | Admitting: Orthopedic Surgery

## 2024-04-01 NOTE — Progress Notes (Deleted)
 "  Referring Physician:  Leigh Venetia CROME, MD [address] [place],  [place] [address]  Primary Physician:  [facility]  History of Present Illness: 04/01/2024*** [name] has a history of cardiomyopathy, coronary embolism, NSTEMI x 2, CAD with CABG, GERD, DM, BPH, CKD stage 3, fecal incontinence, hyperlipidemia, obesity, CHF, sarcoidosis, OSA.   History of lumbar surgery x 2.    He is on PLAVIX .   He sees pain management- he is on oxycodone  and lyrica .   Neck pain radiating to the left arm EMG 06/26/2022- evaluation of the right upper and lower limbs   Duration: *** Location: *** Quality: *** Severity: ***  Precipitating: aggravated by *** Modifying factors: made better by *** Weakness: none Timing: ***  Tobacco use: smokes 1/2 PPD x *** years.   Bowel/Bladder Dysfunction: none  Conservative measures:  Physical therapy: *** has participated with Michael [name]- 04/24/23-05/22/23-8 visits Multimodal medical therapy including regular antiinflammatories: *** Robaxin , Flexeril, Oxycodone , Lyrica  Injections: *** 12/29/2005- LEFT L4-5 INTERLAMINAR EPIDURAL INJECTION  11/24/2005-  LEFT L4-5 INTERLAMINAR EPIDURAL INJECTION   Past Surgery: ***2009- Balateral L4-L5  2012-back surgery-what kind??  Michael [name] has ***no symptoms of cervical myelopathy.  The symptoms are causing a significant impact on the patient's life.   Review of Systems:  A 10 point review of systems is negative, except for the pertinent positives and negatives detailed in the HPI.  Past Medical History: Past Medical History:  Diagnosis Date   Allergic rhinitis    Arthritis    CHF (congestive heart failure) (HCC)    Chronic low back pain    follwed in [facility](Michael [name])   CKD (chronic kidney disease), stage II    Depression    Depression with anxiety    GERD (gastroesophageal reflux disease)    History of kidney stones    passed   HLD (hyperlipidemia)  03/23/2019   Hypertension    Insomnia    Migraines    NSTEMI (non-ST elevated myocardial infarction) (HCC) 11/2022   x2   Renal insufficiency    Sarcoidosis    Multisystem,pulmonary and hepatic (Michael [name])Liver bx in 06 mildly postive AMA but on re-check normal   Sleep apnea    tested, but said it wasnt bad enough for a machine.    Past Surgical History: Past Surgical History:  Procedure Laterality Date   BACK SURGERY  2012   2nd back surgery 3 monthes ago and in 2007   CARDIAC CATHETERIZATION  05/20/2019   COLONOSCOPY  11/05/2003   MFM:Fpwpfjo internal hemorrhoids, otherwise normal rectum and colon   CORONARY/GRAFT ACUTE MI REVASCULARIZATION N/A 03/20/2019   Procedure: Coronary/Graft Acute MI Revascularization;  Surgeon: Michael Debby LABOR, MD;  Location[facility] INVASIVE CV LAB;  Service: Cardiovascular;  Laterality: N/A;   CORONARY/GRAFT ACUTE MI REVASCULARIZATION N/A 06/10/2022   Procedure: Coronary/Graft Acute MI Revascularization;  Surgeon: Michael Bruckner, MD;  Location: [facility] INVASIVE CV LAB;  Service: Cardiovascular;  Laterality: N/A;   ESOPHAGOGASTRODUODENOSCOPY  03/22/2004   MFM:Wnmfjo esophagogastroduodenoscopy. Small hiatal hernia otherwise normal stomach   HARVEST BONE GRAFT Left    thigh   HIP ARTHROPLASTY Bilateral    left 2017, right 2016   HIP SURGERY Left 2017   bone graft   LAMINECTOMY  2009   Balateral L4-L5   LEFT HEART CATH AND CORONARY ANGIOGRAPHY N/A 03/20/2019   Procedure: LEFT HEART CATH AND CORONARY ANGIOGRAPHY;  Surgeon: Michael Debby LABOR, MD;  Location: [facility] INVASIVE CV LAB;  Service: Cardiovascular;  Laterality: N/A;   LEFT HEART CATH AND CORONARY ANGIOGRAPHY N/A 05/20/2019   Procedure: LEFT HEART CATH AND CORONARY ANGIOGRAPHY;  Surgeon: Michael Alm ORN, MD;  Location: [facility] INVASIVE CV LAB;  Service: Cardiovascular;  Laterality: N/A;   LEFT HEART CATH AND CORONARY ANGIOGRAPHY N/A 06/10/2022   Procedure: LEFT HEART CATH AND CORONARY ANGIOGRAPHY;  Surgeon: Michael Bruckner, MD;  Location: [facility] INVASIVE CV LAB;  Service: Cardiovascular;  Laterality: N/A;   RESECTION DISTAL CLAVICAL Right 01/23/2019   Procedure: OPEN DISTAL CLAVICLE EXCISION;  Surgeon: Michael Taft BRAVO, MD;  Location: [facility] ORS;  Service: Orthopedics;  Laterality: Right;   TEE WITHOUT CARDIOVERSION N/A 05/03/2023   Procedure: TRANSESOPHAGEAL ECHOCARDIOGRAM (TEE);  Surgeon: Mallipeddi, Michael P, MD;  Location: [facility] ORS;  Service: Cardiovascular;  Laterality: N/A;   TOOTH EXTRACTION Bilateral 12/28/2017   Procedure: DENTAL RESTORATION/EXTRACTIONS;  Surgeon: Michael [name], DDS;  Location: [facility] OR;  Service: Oral Surgery;  Laterality: Bilateral;    Allergies: Allergies as of 04/07/2024 - Review Complete 02/29/2024  Allergen Reaction Noted   Brilinta  [ticagrelor ] Shortness Of Breath 11/03/2022   Fentanyl  Rash 05/28/2015   Methadone Other (See Comments)     Medications: Outpatient Encounter Medications as of 04/07/2024  Medication Sig   albuterol  (PROVENTIL ) (2.5 MG/3ML) 0.083% nebulizer solution Take 2.5 mg by nebulization every 6 (six) hours as needed.   albuterol  (VENTOLIN  HFA) 108 (90 Base) MCG/ACT inhaler Inhale 2 puffs into the lungs every 6 (six) hours as needed for wheezing or shortness of breath.   amLODipine  (NORVASC ) 10 MG tablet Take 1 tablet (10 mg total) by mouth daily.   amoxicillin -clavulanate (AUGMENTIN ) 875-125 MG tablet Take 1 tablet by mouth every 12 (twelve) hours. (Patient not taking: Reported on 02/29/2024)   ARIPiprazole  (ABILIFY ) 10 MG tablet Take 10 mg by mouth daily.   aspirin  EC 81 MG tablet Take 81 mg by mouth daily. Swallow whole.   budesonide -formoterol  (SYMBICORT ) 160-4.5 MCG/ACT inhaler Inhale 2 puffs into the lungs 2 (two) times daily.   clopidogrel  (PLAVIX ) 75 MG tablet take 4 tablets on day 1 and then 1 tablet daily starting on day 2. (Patient taking differently: 40 mg. take 4 tablets on day 1 and then 1 tablet daily starting on day 2.)   cyanocobalamin   (VITAMIN B12) 1000 MCG tablet Take 1,000 mcg by mouth daily.   cyclobenzaprine (FLEXERIL) 10 MG tablet Take 10 mg by mouth at bedtime.   empagliflozin  (JARDIANCE ) 10 MG TABS tablet Take 1 tablet by mouth once daily   Evolocumab  (REPATHA  SURECLICK) 140 MG/ML SOAJ Inject 140 mg into the skin every 14 (fourteen) days. Inject subcutaneously into abdomen, thigh, or upper arm every 14 days. Rotate injection sites and do not inject into areas where skin is tender, bruised, or red. Please contact  [facility] for any questions or concerns regarding this medication.   fenofibrate  (TRICOR ) 145 MG tablet Take 1 tablet (145 mg total) by mouth daily.   FLUoxetine  (PROZAC ) 20 MG capsule Take 20 mg by mouth every morning.   furosemide  (LASIX ) 20 MG tablet take 1 tablet by mouth once daily as needed   isosorbide  mononitrate (IMDUR ) 30 MG 24 hr tablet Take 1 tablet (30 mg total) by mouth daily.   meclizine (ANTIVERT) 25 MG tablet Take 25 mg by mouth 3 (three) times daily.   methocarbamol  (ROBAXIN ) 750 MG tablet Take 750 mg by mouth every 8 (eight) hours as needed for muscle spasms.   metoprolol  succinate (TOPROL -XL) 25 MG 24 hr tablet Take 1/2  tablet (12.5 mg total) by mouth every morning.   Nebulizer MISC machine   nitroGLYCERIN  (NITROSTAT ) 0.4 MG SL tablet Place 1 tablet (0.4 mg total) under the tongue every 5 (five) minutes as needed for chest pain.   Oxycodone  HCl 10 MG TABS Take 10 mg by mouth every 6 (six) hours. (Patient taking differently: Take 10 mg by mouth every 6 (six) hours. 5 pills a day)   pregabalin  (LYRICA ) 200 MG capsule Take 200 mg by mouth in the morning, at noon, and at bedtime.   rosuvastatin  (CRESTOR ) 40 MG tablet Take 1 tablet (40 mg total) by mouth daily.   Study - EVOLVE-MI - evolocumab  (REPATHA ) 140 mg/mL SQ injection (PI-Stuckey) Inject 1 mL (140 mg total) into the skin every 14 (fourteen) days. For Investigational Use Only. Inject subcutaneously into abdomen, thigh, or  upper arm every 14 days. Rotate injection sites and do not inject into areas where skin is tender, bruised, or red. Please contact [facility] for any questions or concerns regarding this medication.   tamsulosin (FLOMAX) 0.4 MG CAPS capsule Take 0.4 mg by mouth daily.   zolpidem  (AMBIEN ) 10 MG tablet Take 10 mg by mouth at bedtime as needed for sleep.   No facility-administered encounter medications on file as of 04/07/2024.    Social History: Social History[1]  Family Medical History: Family History  Problem Relation Age of Onset   Colon cancer Brother        in his 64s   Heart failure Mother     Physical Examination: There were no vitals filed for this visit.  General: Patient is well developed, well nourished, calm, collected, and in no apparent distress. Attention to examination is appropriate.  Respiratory: Patient is breathing without any difficulty.   NEUROLOGICAL:     Awake, alert, oriented to person, place, and time.  Speech is clear and fluent. Fund of knowledge is appropriate.   Cranial Nerves: Pupils equal round and reactive to light.  Facial tone is symmetric.    *** ROM of cervical spine *** pain *** posterior cervical tenderness. *** tenderness in bilateral trapezial region.   *** ROM of lumbar spine *** pain *** posterior lumbar tenderness.   No abnormal lesions on exposed skin.   Strength: Side Biceps Triceps Deltoid Interossei Grip Wrist Ext. Wrist Flex.  R 5 5 5 5 5 5 5   L 5 5 5 5 5 5 5    Side Iliopsoas Quads Hamstring PF DF EHL  R 5 5 5 5 5 5   L 5 5 5 5 5 5    Reflexes are ***2+ and symmetric at the biceps, brachioradialis, patella and achilles.   Hoffman's is absent.  Clonus is not present.   Bilateral upper and lower extremity sensation is intact to light touch.     Gait is normal.   ***No difficulty with tandem gait.    Medical Decision Making  Imaging: Cervical MRI dated 03/11/24:  FINDINGS: Alignment: Normal    Vertebrae: No fracture, evidence of discitis, or bone lesion.   Cord: Abnormal T2 and STIR signal intensity at the C7-T1 level. On the axial images cysts has a somewhat snake eyes appearance and is suspicious for ischemic changes likely related to the large disc protrusion at C6-7.   Posterior Fossa, vertebral arteries, paraspinal tissues: No significant findings.   Disc levels:   C2-3: Broad-based disc osteophyte complex with mass effect on the ventral thecal sac and narrowing of the ventral CSF space. No foraminal stenosis.  C3-4: Moderate-sized central disc protrusion with focal mass effect on the ventral thecal sac. This comes close to contacting the ventral cord. Mild facet disease and uncinate spurring contributes to mild bilateral foraminal stenosis.   C4-5: Bulging annulus, shallow central disc protrusion contributing to mass effect on the ventral thecal sac and narrowing of the ventral CSF space. No foraminal stenosis.   C5-6: Moderate to large central and more significantly left paracentral disc protrusion which also extrudes into the left neural foramen. There is significant mass effect on the left side of the thecal sac and on the left C6 nerve root.   C6-7: Large central disc extrusion with significant mass effect on the ventral thecal sac and cervical spinal cord which is compressed. No foraminal stenosis.   C7-T1: Shallow central disc protrusion with focal mass effect on the ventral thecal sac. No foraminal stenosis   IMPRESSION: 1. Large central disc extrusion at C6-7 with significant mass effect on the ventral thecal sac and significant compression of the cervical spinal cord. 2. Moderate to large central and more significantly left paracentral disc protrusion at C5-6 which also extrudes into the left neural foramen. There is significant mass effect on the left side of the thecal sac and on the left C6 nerve root. 3. Moderate-sized central disc  protrusion at C3-4 with focal mass effect on the ventral thecal sac. This comes close to contacting the ventral cord. Mild bilateral foraminal stenosis. 4. Abnormal T2 and STIR signal intensity in the cervical spinal cord at the C7-T1 level. On the axial images cysts has a somewhat snake eyes appearance and is suspicious for ischemic changes likely related to the large disc protrusion at C6-7.   These results will be called to the ordering clinician or representative by the Radiologist Assistant, and communication documented in the PACS or [employer].     Electronically Signed   By: MYRTIS Stammer M.D.   On: 03/16/2024 13:49  I have personally reviewed the images and agree with the above interpretation.  Assessment and Plan: [name] is a pleasant 63 y.o. male has ***  Treatment options discussed with patient and following plan made:   - Order for physical therapy for *** spine ***. Patient to call to schedule appointment. *** - Continue current medications including ***. Reviewed dosing and side effects.  - Prescription for ***. Reviewed dosing and side effects. Take with food.  - Prescription for *** to take prn muscle spasms. Reviewed dosing and side effects. Discussed this can cause drowsiness.  - MRI of *** to further evaluate *** radiculopathy. No improvement time or medications (***).  - Referral to PMR at [facility] to discuss possible *** injections.  - Will schedule phone visit to review MRI results once I get them back.   I spent a total of *** minutes in face-to-face and non-face-to-face activities related to this patient's care today including review of outside records, review of imaging, review of symptoms, physical exam, discussion of differential diagnosis, discussion of treatment options, and documentation.   Thank you for involving me in the care of this patient.   Glade Boys PA-C Dept. of Neurosurgery     [1]  Social History Tobacco Use   Smoking status:  Every Day    Current packs/day: 0.50    Average packs/day: 0.5 packs/day for 29.0 years (14.5 ttl pk-yrs)    Types: Cigarettes   Smokeless tobacco: Never   Tobacco comments:    Smokes 1/2 pack per day--09/20/20/ 3-4 cigs a  day 06/01/22  5 cig a day 02/29/24  Vaping Use   Vaping status: Never Used  Substance Use Topics   Alcohol use: Not Currently    Comment: Occ   Drug use: No   "

## 2024-04-07 ENCOUNTER — Ambulatory Visit: Admitting: Orthopedic Surgery

## 2024-04-30 NOTE — Progress Notes (Unsigned)
 "  Referring Physician:  Leigh Venetia CROME, MD [address] [place],  [place] [address]  Primary Physician:  [facility]  History of Present Illness: 04/30/2024*** Michael [name] has a history of cardiomyopathy, coronary embolism, NSTEMI x 2, CAD with CABG, GERD, DM, BPH, CKD stage 3, fecal incontinence, hyperlipidemia, obesity, CHF, sarcoidosis, OSA.   History of lumbar surgery x 2.    He is on PLAVIX .   He sees pain management- he is on oxycodone  and lyrica .   Neck pain radiating to the left arm EMG 06/26/2022- evaluation of the right upper and lower limbs   Duration: *** Location: *** Quality: *** Severity: ***  Precipitating: aggravated by *** Modifying factors: made better by *** Weakness: none Timing: ***  Tobacco use: smokes 1/2 PPD x *** years.   Bowel/Bladder Dysfunction: none  Conservative measures:  Physical therapy: *** has participated with Zelda Salmon- 04/24/23-05/22/23-8 visits Multimodal medical therapy including regular antiinflammatories: *** Robaxin , Flexeril, Oxycodone , Lyrica  Injections: *** 12/29/2005- LEFT L4-5 INTERLAMINAR EPIDURAL INJECTION  11/24/2005-  LEFT L4-5 INTERLAMINAR EPIDURAL INJECTION   Past Surgery: ***2009- Balateral L4-L5  2012-back surgery-what kind??  Michael [name] has ***no symptoms of cervical myelopathy.  The symptoms are causing a significant impact on the patient's life.   Review of Systems:  A 10 point review of systems is negative, except for the pertinent positives and negatives detailed in the HPI.  Past Medical History: Past Medical History:  Diagnosis Date   Allergic rhinitis    Arthritis    CHF (congestive heart failure) (HCC)    Chronic low back pain    follwed in Pain Clinic(Dr.Kirchmayer)   CKD (chronic kidney disease), stage II    Depression    Depression with anxiety    GERD (gastroesophageal reflux disease)    History of kidney stones    passed   HLD (hyperlipidemia)  03/23/2019   Hypertension    Insomnia    Migraines    NSTEMI (non-ST elevated myocardial infarction) (HCC) 11/2022   x2   Renal insufficiency    Sarcoidosis    Multisystem,pulmonary and hepatic (Dr.Rourk)Liver bx in 06 mildly postive AMA but on re-check normal   Sleep apnea    tested, but said it wasnt bad enough for a machine.    Past Surgical History: Past Surgical History:  Procedure Laterality Date   BACK SURGERY  2012   2nd back surgery 3 monthes ago and in 2007   CARDIAC CATHETERIZATION  05/20/2019   COLONOSCOPY  11/05/2003   MFM:Fpwpfjo internal hemorrhoids, otherwise normal rectum and colon   CORONARY/GRAFT ACUTE MI REVASCULARIZATION N/A 03/20/2019   Procedure: Coronary/Graft Acute MI Revascularization;  Surgeon: Burnard Debby LABOR, MD;  Location: [facility] INVASIVE CV LAB;  Service: Cardiovascular;  Laterality: N/A;   CORONARY/GRAFT ACUTE MI REVASCULARIZATION N/A 06/10/2022   Procedure: Coronary/Graft Acute MI Revascularization;  Surgeon: Mady Bruckner, MD;  Location: [facility] INVASIVE CV LAB;  Service: Cardiovascular;  Laterality: N/A;   ESOPHAGOGASTRODUODENOSCOPY  03/22/2004   MFM:Wnmfjo esophagogastroduodenoscopy. Small hiatal hernia otherwise normal stomach   HARVEST BONE GRAFT Left    thigh   HIP ARTHROPLASTY Bilateral    left 2017, right 2016   HIP SURGERY Left 2017   bone graft   LAMINECTOMY  2009   Balateral L4-L5   LEFT HEART CATH AND CORONARY ANGIOGRAPHY N/A 03/20/2019   Procedure: LEFT HEART CATH AND CORONARY ANGIOGRAPHY;  Surgeon: Burnard Debby LABOR, MD;  Location: [facility] INVASIVE CV LAB;  Service: Cardiovascular;  Laterality: N/A;   LEFT HEART CATH AND CORONARY ANGIOGRAPHY N/A 05/20/2019   Procedure: LEFT HEART CATH AND CORONARY ANGIOGRAPHY;  Surgeon: Anner Alm ORN, MD;  Location: [facility] INVASIVE CV LAB;  Service: Cardiovascular;  Laterality: N/A;   LEFT HEART CATH AND CORONARY ANGIOGRAPHY N/A 06/10/2022   Procedure: LEFT HEART CATH AND CORONARY ANGIOGRAPHY;  Surgeon: Mady Bruckner, MD;  Location: [facility] INVASIVE CV LAB;  Service: Cardiovascular;  Laterality: N/A;   RESECTION DISTAL CLAVICAL Right 01/23/2019   Procedure: OPEN DISTAL CLAVICLE EXCISION;  Surgeon: Margrette Taft BRAVO, MD;  Location: [facility] ORS;  Service: Orthopedics;  Laterality: Right;   TEE WITHOUT CARDIOVERSION N/A 05/03/2023   Procedure: TRANSESOPHAGEAL ECHOCARDIOGRAM (TEE);  Surgeon: Mallipeddi, Vishnu P, MD;  Location: [facility] ORS;  Service: Cardiovascular;  Laterality: N/A;   TOOTH EXTRACTION Bilateral 12/28/2017   Procedure: DENTAL RESTORATION/EXTRACTIONS;  Surgeon: Sheryle Hamilton, DDS;  [facility];  Service: Oral Surgery;  Laterality: Bilateral;    Allergies: Allergies as of 05/06/2024 - Review Complete 02/29/2024  Allergen Reaction Noted   Brilinta  [ticagrelor ] Shortness Of Breath 11/03/2022   Fentanyl  Rash 05/28/2015   Methadone Other (See Comments)     Medications: Outpatient Encounter Medications as of 05/06/2024  Medication Sig   albuterol  (PROVENTIL ) (2.5 MG/3ML) 0.083% nebulizer solution Take 2.5 mg by nebulization every 6 (six) hours as needed.   albuterol  (VENTOLIN  HFA) 108 (90 Base) MCG/ACT inhaler Inhale 2 puffs into the lungs every 6 (six) hours as needed for wheezing or shortness of breath.   amLODipine  (NORVASC ) 10 MG tablet Take 1 tablet (10 mg total) by mouth daily.   amoxicillin -clavulanate (AUGMENTIN ) 875-125 MG tablet Take 1 tablet by mouth every 12 (twelve) hours. (Patient not taking: Reported on 02/29/2024)   ARIPiprazole  (ABILIFY ) 10 MG tablet Take 10 mg by mouth daily.   aspirin  EC 81 MG tablet Take 81 mg by mouth daily. Swallow whole.   budesonide -formoterol  (SYMBICORT ) 160-4.5 MCG/ACT inhaler Inhale 2 puffs into the lungs 2 (two) times daily.   clopidogrel  (PLAVIX ) 75 MG tablet take 4 tablets on day 1 and then 1 tablet daily starting on day 2. (Patient taking differently: 40 mg. take 4 tablets on day 1 and then 1 tablet daily starting on day 2.)   cyanocobalamin   (VITAMIN B12) 1000 MCG tablet Take 1,000 mcg by mouth daily.   cyclobenzaprine (FLEXERIL) 10 MG tablet Take 10 mg by mouth at bedtime.   empagliflozin  (JARDIANCE ) 10 MG TABS tablet Take 1 tablet by mouth once daily   Evolocumab  (REPATHA  SURECLICK) 140 MG/ML SOAJ Inject 140 mg into the skin every 14 (fourteen) days. Inject subcutaneously into abdomen, thigh, or upper arm every 14 days. Rotate injection sites and do not inject into areas where skin is tender, bruised, or red. Please contact [facility] for any questions or concerns regarding this medication.   fenofibrate  (TRICOR ) 145 MG tablet Take 1 tablet (145 mg total) by mouth daily.   FLUoxetine  (PROZAC ) 20 MG capsule Take 20 mg by mouth every morning.   furosemide  (LASIX ) 20 MG tablet take 1 tablet by mouth once daily as needed   isosorbide  mononitrate (IMDUR ) 30 MG 24 hr tablet Take 1 tablet (30 mg total) by mouth daily.   meclizine (ANTIVERT) 25 MG tablet Take 25 mg by mouth 3 (three) times daily.   methocarbamol  (ROBAXIN ) 750 MG tablet Take 750 mg by mouth every 8 (eight) hours as needed for muscle spasms.   metoprolol  succinate (TOPROL -XL) 25 MG 24 hr tablet Take 1/2  tablet (12.5 mg total) by mouth every morning.   Nebulizer MISC machine   nitroGLYCERIN  (NITROSTAT ) 0.4 MG SL tablet Place 1 tablet (0.4 mg total) under the tongue every 5 (five) minutes as needed for chest pain.   Oxycodone  HCl 10 MG TABS Take 10 mg by mouth every 6 (six) hours. (Patient taking differently: Take 10 mg by mouth every 6 (six) hours. 5 pills a day)   pregabalin  (LYRICA ) 200 MG capsule Take 200 mg by mouth in the morning, at noon, and at bedtime.   rosuvastatin  (CRESTOR ) 40 MG tablet Take 1 tablet (40 mg total) by mouth daily.   Study - EVOLVE-MI - evolocumab  (REPATHA ) 140 mg/mL SQ injection (PI-Stuckey) Inject 1 mL (140 mg total) into the skin every 14 (fourteen) days. For Investigational Use Only. Inject subcutaneously into abdomen, thigh, or  upper arm every 14 days. Rotate injection sites and do not inject into areas where skin is tender, bruised, or red. Please contact [facility] for any questions or concerns regarding this medication.   tamsulosin (FLOMAX) 0.4 MG CAPS capsule Take 0.4 mg by mouth daily.   zolpidem  (AMBIEN ) 10 MG tablet Take 10 mg by mouth at bedtime as needed for sleep.   No facility-administered encounter medications on file as of 05/06/2024.    Social History: Social History[1]  Family Medical History: Family History  Problem Relation Age of Onset   Colon cancer Brother        in his 18s   Heart failure Mother     Physical Examination: There were no vitals filed for this visit.  General: Patient is well developed, well nourished, calm, collected, and in no apparent distress. Attention to examination is appropriate.  Respiratory: Patient is breathing without any difficulty.   NEUROLOGICAL:     Awake, alert, oriented to person, place, and time.  Speech is clear and fluent. Fund of knowledge is appropriate.   Cranial Nerves: Pupils equal round and reactive to light.  Facial tone is symmetric.    *** ROM of cervical spine *** pain *** posterior cervical tenderness. *** tenderness in bilateral trapezial region.   *** ROM of lumbar spine *** pain *** posterior lumbar tenderness.   No abnormal lesions on exposed skin.   Strength: Side Biceps Triceps Deltoid Interossei Grip Wrist Ext. Wrist Flex.  R 5 5 5 5 5 5 5   L 5 5 5 5 5 5 5    Side Iliopsoas Quads Hamstring PF DF EHL  R 5 5 5 5 5 5   L 5 5 5 5 5 5    Reflexes are ***2+ and symmetric at the biceps, brachioradialis, patella and achilles.   Hoffman's is absent.  Clonus is not present.   Bilateral upper and lower extremity sensation is intact to light touch.     Gait is normal.   ***No difficulty with tandem gait.    Medical Decision Making  Imaging: Cervical MRI dated 03/11/24:  FINDINGS: Alignment: Normal    Vertebrae: No fracture, evidence of discitis, or bone lesion.   Cord: Abnormal T2 and STIR signal intensity at the C7-T1 level. On the axial images cysts has a somewhat snake eyes appearance and is suspicious for ischemic changes likely related to the large disc protrusion at C6-7.   Posterior Fossa, vertebral arteries, paraspinal tissues: No significant findings.   Disc levels:   C2-3: Broad-based disc osteophyte complex with mass effect on the ventral thecal sac and narrowing of the ventral CSF space. No foraminal stenosis.  C3-4: Moderate-sized central disc protrusion with focal mass effect on the ventral thecal sac. This comes close to contacting the ventral cord. Mild facet disease and uncinate spurring contributes to mild bilateral foraminal stenosis.   C4-5: Bulging annulus, shallow central disc protrusion contributing to mass effect on the ventral thecal sac and narrowing of the ventral CSF space. No foraminal stenosis.   C5-6: Moderate to large central and more significantly left paracentral disc protrusion which also extrudes into the left neural foramen. There is significant mass effect on the left side of the thecal sac and on the left C6 nerve root.   C6-7: Large central disc extrusion with significant mass effect on the ventral thecal sac and cervical spinal cord which is compressed. No foraminal stenosis.   C7-T1: Shallow central disc protrusion with focal mass effect on the ventral thecal sac. No foraminal stenosis   IMPRESSION: 1. Large central disc extrusion at C6-7 with significant mass effect on the ventral thecal sac and significant compression of the cervical spinal cord. 2. Moderate to large central and more significantly left paracentral disc protrusion at C5-6 which also extrudes into the left neural foramen. There is significant mass effect on the left side of the thecal sac and on the left C6 nerve root. 3. Moderate-sized central disc  protrusion at C3-4 with focal mass effect on the ventral thecal sac. This comes close to contacting the ventral cord. Mild bilateral foraminal stenosis. 4. Abnormal T2 and STIR signal intensity in the cervical spinal cord at the C7-T1 level. On the axial images cysts has a somewhat snake eyes appearance and is suspicious for ischemic changes likely related to the large disc protrusion at C6-7.   These results will be called to the ordering clinician or representative by the Radiologist Assistant, and communication documented in the PACS or [employer].     Electronically Signed   By: MYRTIS Stammer M.D.   On: 03/16/2024 13:49  I have personally reviewed the images and agree with the above interpretation.   EMG of right upper extremity and bilateral lower extremities dated 06/26/22:  Impression: This is an abnormal study. The findings are most consistent with the following: The residuals of an old intraspinal canal lesion (ie: motor radiculopathy) at the right L5 root, mild in degree electrically. No electrodiagnostic evidence of a large fiber sensorimotor polyneuropathy. No electrodiagnostic evidence of a right cervical (C5-C8) motor radiculopathy. Reduced bilateral ulnar (ADM) motor responses are of unclear clinical significance given normal needle examination of ulnar innervated muscles and may be technical in nature. The findings are too limited in degree and distribution for definitive diagnosis.       ___________________________ Venetia Potters, MD  Assessment and Plan: Michael [name] is a pleasant 64 y.o. male has ***  Treatment options discussed with patient and following plan made:   - Order for physical therapy for *** spine ***. Patient to call to schedule appointment. *** - Continue current medications including ***. Reviewed dosing and side effects.  - Prescription for ***. Reviewed dosing and side effects. Take with food.  - Prescription for *** to take prn muscle  spasms. Reviewed dosing and side effects. Discussed this can cause drowsiness.  - MRI of *** to further evaluate *** radiculopathy. No improvement time or medications (***).  - Referral to PMR at [facility] to discuss possible *** injections.  - Will schedule phone visit to review MRI results once I get them back.   I spent a total of *** minutes in  face-to-face and non-face-to-face activities related to this patient's care today including review of outside records, review of imaging, review of symptoms, physical exam, discussion of differential diagnosis, discussion of treatment options, and documentation.   Thank you for involving me in the care of this patient.   Michael Boys PA-C Dept. of Neurosurgery     [1]  Social History Tobacco Use   Smoking status: Every Day    Current packs/day: 0.50    Average packs/day: 0.5 packs/day for 29.0 years (14.5 ttl pk-yrs)    Types: Cigarettes   Smokeless tobacco: Never   Tobacco comments:    Smokes 1/2 pack per day--09/20/20/ 3-4 cigs a day 06/01/22  5 cig a day 02/29/24  Vaping Use   Vaping status: Never Used  Substance Use Topics   Alcohol use: Not Currently    Comment: Occ   Drug use: No   "

## 2024-05-06 ENCOUNTER — Ambulatory Visit: Admitting: Orthopedic Surgery

## 2024-05-07 ENCOUNTER — Other Ambulatory Visit: Payer: Self-pay

## 2024-05-07 ENCOUNTER — Other Ambulatory Visit (HOSPITAL_COMMUNITY): Payer: Self-pay

## 2024-05-07 ENCOUNTER — Other Ambulatory Visit (HOSPITAL_COMMUNITY): Payer: Self-pay | Admitting: Cardiology

## 2024-05-07 DIAGNOSIS — I2119 ST elevation (STEMI) myocardial infarction involving other coronary artery of inferior wall: Secondary | ICD-10-CM

## 2024-05-07 MED ORDER — REPATHA SURECLICK 140 MG/ML ~~LOC~~ SOAJ
140.0000 mg | SUBCUTANEOUS | 2 refills | Status: DC
  Filled 2024-05-07: qty 12, 168d supply, fill #0
  Filled 2024-05-08: qty 6, 84d supply, fill #0

## 2024-05-08 ENCOUNTER — Other Ambulatory Visit (HOSPITAL_COMMUNITY): Payer: Self-pay

## 2024-05-08 ENCOUNTER — Other Ambulatory Visit: Payer: Self-pay

## 2024-05-12 ENCOUNTER — Other Ambulatory Visit: Payer: Self-pay

## 2024-05-12 ENCOUNTER — Other Ambulatory Visit (HOSPITAL_COMMUNITY): Payer: Self-pay | Admitting: Pharmacist

## 2024-05-12 MED ORDER — REPATHA SURECLICK 140 MG/ML ~~LOC~~ SOAJ
140.0000 mg | SUBCUTANEOUS | 2 refills | Status: AC
  Filled 2024-05-12 – 2024-05-13 (×2): qty 12, 168d supply, fill #0

## 2024-05-13 ENCOUNTER — Other Ambulatory Visit: Payer: Self-pay

## 2024-05-14 ENCOUNTER — Other Ambulatory Visit: Payer: Self-pay

## 2024-06-10 ENCOUNTER — Ambulatory Visit: Admitting: Orthopedic Surgery
